# Patient Record
Sex: Male | Born: 1950 | Race: Black or African American | Hispanic: No | Marital: Married | Smoking: Current every day smoker
Health system: Southern US, Community
[De-identification: ages and names within clinical notes are randomized; demographics above are authoritative.]

## PROBLEM LIST (undated history)

## (undated) DIAGNOSIS — I5042 Chronic combined systolic (congestive) and diastolic (congestive) heart failure: Secondary | ICD-10-CM

## (undated) DIAGNOSIS — I428 Other cardiomyopathies: Secondary | ICD-10-CM

## (undated) DIAGNOSIS — J449 Chronic obstructive pulmonary disease, unspecified: Secondary | ICD-10-CM

## (undated) DIAGNOSIS — E119 Type 2 diabetes mellitus without complications: Secondary | ICD-10-CM

## (undated) DIAGNOSIS — I509 Heart failure, unspecified: Secondary | ICD-10-CM

## (undated) DIAGNOSIS — F1910 Other psychoactive substance abuse, uncomplicated: Secondary | ICD-10-CM

## (undated) DIAGNOSIS — N183 Chronic kidney disease, stage 3 unspecified: Secondary | ICD-10-CM

## (undated) DIAGNOSIS — I480 Paroxysmal atrial fibrillation: Secondary | ICD-10-CM

## (undated) DIAGNOSIS — Z720 Tobacco use: Secondary | ICD-10-CM

## (undated) DIAGNOSIS — I119 Hypertensive heart disease without heart failure: Secondary | ICD-10-CM

## (undated) DIAGNOSIS — I2510 Atherosclerotic heart disease of native coronary artery without angina pectoris: Secondary | ICD-10-CM

## (undated) HISTORY — DX: Hypertensive heart disease without heart failure: I11.9

## (undated) HISTORY — DX: Other cardiomyopathies: I42.8

## (undated) HISTORY — DX: Tobacco use: Z72.0

## (undated) HISTORY — DX: Chronic obstructive pulmonary disease, unspecified: J44.9

## (undated) HISTORY — DX: Paroxysmal atrial fibrillation: I48.0

---

## 2015-02-09 ENCOUNTER — Emergency Department: Payer: Self-pay

## 2015-02-09 ENCOUNTER — Other Ambulatory Visit: Payer: Self-pay

## 2015-02-09 ENCOUNTER — Emergency Department
Admission: EM | Admit: 2015-02-09 | Discharge: 2015-02-09 | Disposition: A | Discharge status: Home / Self Care | Payer: Self-pay | Attending: Emergency Medicine | Admitting: Emergency Medicine

## 2015-02-09 ENCOUNTER — Encounter: Payer: Self-pay | Admitting: Emergency Medicine

## 2015-02-09 DIAGNOSIS — Z720 Tobacco use: Secondary | ICD-10-CM | POA: Insufficient documentation

## 2015-02-09 DIAGNOSIS — I10 Essential (primary) hypertension: Secondary | ICD-10-CM | POA: Insufficient documentation

## 2015-02-09 DIAGNOSIS — J441 Chronic obstructive pulmonary disease with (acute) exacerbation: Secondary | ICD-10-CM | POA: Insufficient documentation

## 2015-02-09 DIAGNOSIS — J40 Bronchitis, not specified as acute or chronic: Secondary | ICD-10-CM

## 2015-02-09 DIAGNOSIS — E119 Type 2 diabetes mellitus without complications: Secondary | ICD-10-CM | POA: Insufficient documentation

## 2015-02-09 HISTORY — DX: Type 2 diabetes mellitus without complications: E11.9

## 2015-02-09 LAB — CBC
HCT: 41.6 % (ref 40.0–52.0)
Hemoglobin: 13.5 g/dL (ref 13.0–18.0)
MCH: 31.2 pg (ref 26.0–34.0)
MCHC: 32.5 g/dL (ref 32.0–36.0)
MCV: 95.9 fL (ref 80.0–100.0)
Platelets: 192 10*3/uL (ref 150–440)
RBC: 4.34 MIL/uL — ABNORMAL LOW (ref 4.40–5.90)
RDW: 14.7 % — ABNORMAL HIGH (ref 11.5–14.5)
WBC: 3.8 10*3/uL (ref 3.8–10.6)

## 2015-02-09 LAB — BASIC METABOLIC PANEL
Anion gap: 6 (ref 5–15)
BUN: 13 mg/dL (ref 6–20)
CO2: 28 mmol/L (ref 22–32)
Calcium: 9.1 mg/dL (ref 8.9–10.3)
Chloride: 103 mmol/L (ref 101–111)
Creatinine, Ser: 1.05 mg/dL (ref 0.61–1.24)
GFR calc Af Amer: >60 mL/min (ref >60)
GFR calc non Af Amer: >60 mL/min (ref >60)
Glucose, Bld: 275 mg/dL — ABNORMAL HIGH (ref 65–99)
Potassium: 4 mmol/L (ref 3.5–5.1)
Sodium: 137 mmol/L (ref 135–145)

## 2015-02-09 LAB — TROPONIN I: Troponin I: 0.05 ng/mL — ABNORMAL HIGH (ref <0.031)

## 2015-02-09 MED ORDER — PREDNISONE 50 MG PO TABS: 50.0000 mg | ORAL_TABLET | Freq: Every day | ORAL | Status: DC

## 2015-02-09 MED ORDER — IPRATROPIUM-ALBUTEROL 0.5-2.5 (3) MG/3ML IN SOLN
3.0000 mL | Freq: Once | RESPIRATORY_TRACT | Status: AC
  Administered 2015-02-09: 3 mL via RESPIRATORY_TRACT

## 2015-02-09 MED ORDER — IPRATROPIUM-ALBUTEROL 0.5-2.5 (3) MG/3ML IN SOLN
RESPIRATORY_TRACT | Status: AC
  Administered 2015-02-09: 3 mL via RESPIRATORY_TRACT
  Filled 2015-02-09: qty 3

## 2015-02-09 MED ORDER — AZITHROMYCIN 250 MG PO TABS: ORAL_TABLET | ORAL | Status: AC

## 2015-02-09 NOTE — Discharge Instructions (Signed)
Upper Respiratory Infection, Adult An upper respiratory infection (URI) is also sometimes known as the common cold. The upper respiratory tract includes the nose, sinuses, throat, trachea, and bronchi. Bronchi are the airways leading to the lungs. Most people improve within 1 week, but symptoms can last up to 2 weeks. A residual cough may last even longer.  CAUSES Many different viruses can infect the tissues lining the upper respiratory tract. The tissues become irritated and inflamed and often become very moist. Mucus production is also common. A cold is contagious. You can easily spread the virus to others by oral contact. This includes kissing, sharing a glass, coughing, or sneezing. Touching your mouth or nose and then touching a surface, which is then touched by another person, can also spread the virus. SYMPTOMS  Symptoms typically develop 1 to 3 days after you come in contact with a cold virus. Symptoms vary from person to person. They may include:  Runny nose.  Sneezing.  Nasal congestion.  Sinus irritation.  Sore throat.  Loss of voice (laryngitis).  Cough.  Fatigue.  Muscle aches.  Loss of appetite.  Headache.  Low-grade fever. DIAGNOSIS  You might diagnose your own cold based on familiar symptoms, since most people get a cold 2 to 3 times a year. Your caregiver can confirm this based on your exam. Most importantly, your caregiver can check that your symptoms are not due to another disease such as strep throat, sinusitis, pneumonia, asthma, or epiglottitis. Blood tests, throat tests, and X-rays are not necessary to diagnose a common cold, but they may sometimes be helpful in excluding other more serious diseases. Your caregiver will decide if any further tests are required. RISKS AND COMPLICATIONS  You may be at risk for a more severe case of the common cold if you smoke cigarettes, have chronic heart disease (such as heart failure) or lung disease (such as asthma), or if  you have a weakened immune system. The very young and very old are also at risk for more serious infections. Bacterial sinusitis, middle ear infections, and bacterial pneumonia can complicate the common cold. The common cold can worsen asthma and chronic obstructive pulmonary disease (COPD). Sometimes, these complications can require emergency medical care and may be life-threatening. PREVENTION  The best way to protect against getting a cold is to practice good hygiene. Avoid oral or hand contact with people with cold symptoms. Wash your hands often if contact occurs. There is no clear evidence that vitamin C, vitamin E, echinacea, or exercise reduces the chance of developing a cold. However, it is always recommended to get plenty of rest and practice good nutrition. TREATMENT  Treatment is directed at relieving symptoms. There is no cure. Antibiotics are not effective, because the infection is caused by a virus, not by bacteria. Treatment may include:  Increased fluid intake. Sports drinks offer valuable electrolytes, sugars, and fluids.  Breathing heated mist or steam (vaporizer or shower).  Eating chicken soup or other clear broths, and maintaining good nutrition.  Getting plenty of rest.  Using gargles or lozenges for comfort.  Controlling fevers with ibuprofen or acetaminophen as directed by your caregiver.  Increasing usage of your inhaler if you have asthma. Zinc gel and zinc lozenges, taken in the first 24 hours of the common cold, can shorten the duration and lessen the severity of symptoms. Pain medicines may help with fever, muscle aches, and throat pain. A variety of non-prescription medicines are available to treat congestion and runny nose. Your caregiver   can make recommendations and may suggest nasal or lung inhalers for other symptoms.  HOME CARE INSTRUCTIONS   Only take over-the-counter or prescription medicines for pain, discomfort, or fever as directed by your  caregiver.  Use a warm mist humidifier or inhale steam from a shower to increase air moisture. This may keep secretions moist and make it easier to breathe.  Drink enough water and fluids to keep your urine clear or pale yellow.  Rest as needed.  Return to work when your temperature has returned to normal or as your caregiver advises. You may need to stay home longer to avoid infecting others. You can also use a face mask and careful hand washing to prevent spread of the virus. SEEK MEDICAL CARE IF:   After the first few days, you feel you are getting worse rather than better.  You need your caregiver's advice about medicines to control symptoms.  You develop chills, worsening shortness of breath, or brown or red sputum. These may be signs of pneumonia.  You develop yellow or brown nasal discharge or pain in the face, especially when you bend forward. These may be signs of sinusitis.  You develop a fever, swollen neck glands, pain with swallowing, or white areas in the back of your throat. These may be signs of strep throat. SEEK IMMEDIATE MEDICAL CARE IF:   You have a fever.  You develop severe or persistent headache, ear pain, sinus pain, or chest pain.  You develop wheezing, a prolonged cough, cough up blood, or have a change in your usual mucus (if you have chronic lung disease).  You develop sore muscles or a stiff neck. Document Released: 02/01/2001 Document Revised: 10/31/2011 Document Reviewed: 11/13/2013 Exit[employer] Patient Information 2015 [employer]. This information is not intended to replace advice given to you by your health care provider. Make sure you discuss any questions you have with your health care provider.  

## 2015-02-09 NOTE — ED Notes (Signed)
Patient to ED with c/o shortness of breath and mostly non productive cough since Saturday. Reports that he still smokes.

## 2015-02-09 NOTE — ED Notes (Signed)
Reports dry cough has been occuring for the past month

## 2015-02-09 NOTE — ED Provider Notes (Signed)
[facility] Emergency Department Provider Note  ____________________________________________  Time seen: On arrival  I have reviewed the triage vital signs and the nursing notes.   HISTORY  Chief Complaint Shortness of Breath and Cough    HPI Jorge Cruz is a 64 y.o. male who presents with cough and mild shortness of breath. He reports the cough is been going on for approximately one month this gotten worse over the last 3-4 days. He continues to smoke. He denies a history of COPD. He has no fevers or chills. He has no chest pain. Occasionally when he coughs he has discomfort on the left chest but not currently. He reports the cough is moderate in nature.     Past Medical History  Diagnosis Date  . Diabetes mellitus without complication   . Hypertension     There are no active problems to display for this patient.   History reviewed. No pertinent past surgical history.  No current outpatient prescriptions on file.  Allergies Review of patient's allergies indicates no known allergies.  History reviewed. No pertinent family history.  Social History History  Substance Use Topics  . Smoking status: Current Every Day Smoker  . Smokeless tobacco: Never Used  . Alcohol Use: No    Review of Systems  Constitutional: Negative for fever. Eyes: Negative for visual changes. ENT: Negative for sore throat Cardiovascular: Negative for chest pain. Respiratory: Mild short of breath, positive for cough Gastrointestinal: Negative for abdominal pain, vomiting and diarrhea. Genitourinary: Negative for dysuria. Musculoskeletal: Negative for back pain. Skin: Negative for rash. Neurological: Negative for headaches or focal weakness Psychiatric: No anxiety or depression  10-point ROS otherwise negative.  ____________________________________________   PHYSICAL EXAM:  VITAL SIGNS: ED Triage Vitals  Enc Vitals Group     BP 02/09/15 0753 169/81 mmHg      Pulse Rate 02/09/15 0753 84     Resp 02/09/15 0753 22     Temp 02/09/15 0753 98.2 F (36.8 C)     Temp Source 02/09/15 0753 Oral     SpO2 02/09/15 0753 99 %     Weight 02/09/15 0753 222 lb (100.699 kg)     Height 02/09/15 0753  (1.727 m)     Head Cir --      Peak Flow --      Pain Score --      Pain Loc --      Pain Edu? --      Excl. in GC? --      Constitutional: Alert and oriented. Well appearing and in no distress. Pleasant Eyes: Conjunctivae are normal. PERRL. ENT   Head: Normocephalic and atraumatic.    Mouth/Throat: Mucous membranes are moist. Cardiovascular: Normal rate, regular rhythm. Normal and symmetric distal pulses are present in all extremities. No murmurs, rubs, or gallops. Respiratory: Normal respiratory effort without tachypnea nor retractions. Breath sounds are clear and equal bilaterally.  Gastrointestinal: Soft and non-tender in all quadrants. No distention. There is no CVA tenderness. Genitourinary: deferred Musculoskeletal: Nontender with normal range of motion in all extremities. No lower extremity tenderness nor edema. Neurologic:  Normal speech and language. No gross focal neurologic deficits are appreciated. Skin:  Skin is warm, dry and intact. No rash noted. Psychiatric: Mood and affect are normal. Patient exhibits appropriate insight and judgment.  ____________________________________________    LABS (pertinent positives/negatives)  Labs Reviewed  BASIC METABOLIC PANEL  CBC  TROPONIN I    ____________________________________________   EKG  ED ECG REPORT  Dennard Nip, the attending physician, personally viewed and interpreted this ECG.   Date: 02/09/2015  EKG Time: 8:06 AM  Rate: 70  Rhythm: normal sinus rhythm, occasional PVC noted, unifocal  Axis: Normal  Intervals:none  ST&T Change: Nonspecific   ____________________________________________    RADIOLOGY  Chest x-ray reviewed by me, no acute  distress  ____________________________________________   PROCEDURES  Procedure(s) performed: none  Critical Care performed: none  ____________________________________________   INITIAL IMPRESSION / ASSESSMENT AND PLAN / ED COURSE  Pertinent labs & imaging results that were available during my care of the patient were reviewed by me and considered in my medical decision making (see chart for details).  Patient overall well-appearing. We will treat with DuoNeb. We will check blood tests and chest x-ray.  ----------------------------------------- 9:48 AM on 02/09/2015 -----------------------------------------  Patient with very minimally elevated troponin, but he has no chest pain. His EKG is not concerning. Given he is here primarily for cough we will treat this with steroid's and a course of antibiotics with PCP follow-up.   ____________________________________________   FINAL CLINICAL IMPRESSION(S) / ED DIAGNOSES  Final diagnoses:  Bronchitis     Jene Every, MD 02/09/15 [phone]

## 2015-02-09 NOTE — ED Notes (Signed)
Dr. Kinner notified of elevated troponin. 

## 2016-03-31 ENCOUNTER — Encounter: Payer: Self-pay | Admitting: Emergency Medicine

## 2016-03-31 ENCOUNTER — Emergency Department: Payer: Self-pay

## 2016-03-31 ENCOUNTER — Observation Stay
Admission: EM | Admit: 2016-03-31 | Discharge: 2016-04-01 | Disposition: A | Discharge status: Home / Self Care | Payer: Self-pay | Attending: Internal Medicine | Admitting: Internal Medicine

## 2016-03-31 DIAGNOSIS — J441 Chronic obstructive pulmonary disease with (acute) exacerbation: Secondary | ICD-10-CM | POA: Insufficient documentation

## 2016-03-31 DIAGNOSIS — I340 Nonrheumatic mitral (valve) insufficiency: Secondary | ICD-10-CM | POA: Insufficient documentation

## 2016-03-31 DIAGNOSIS — E669 Obesity, unspecified: Secondary | ICD-10-CM | POA: Insufficient documentation

## 2016-03-31 DIAGNOSIS — Z6833 Body mass index (BMI) 33.0-33.9, adult: Secondary | ICD-10-CM | POA: Insufficient documentation

## 2016-03-31 DIAGNOSIS — Z794 Long term (current) use of insulin: Secondary | ICD-10-CM | POA: Insufficient documentation

## 2016-03-31 DIAGNOSIS — E785 Hyperlipidemia, unspecified: Secondary | ICD-10-CM | POA: Insufficient documentation

## 2016-03-31 DIAGNOSIS — R062 Wheezing: Secondary | ICD-10-CM

## 2016-03-31 DIAGNOSIS — I493 Ventricular premature depolarization: Secondary | ICD-10-CM | POA: Insufficient documentation

## 2016-03-31 DIAGNOSIS — J40 Bronchitis, not specified as acute or chronic: Secondary | ICD-10-CM

## 2016-03-31 DIAGNOSIS — Z79899 Other long term (current) drug therapy: Secondary | ICD-10-CM | POA: Insufficient documentation

## 2016-03-31 DIAGNOSIS — I429 Cardiomyopathy, unspecified: Secondary | ICD-10-CM | POA: Insufficient documentation

## 2016-03-31 DIAGNOSIS — F17200 Nicotine dependence, unspecified, uncomplicated: Secondary | ICD-10-CM | POA: Insufficient documentation

## 2016-03-31 DIAGNOSIS — E119 Type 2 diabetes mellitus without complications: Secondary | ICD-10-CM | POA: Insufficient documentation

## 2016-03-31 DIAGNOSIS — I10 Essential (primary) hypertension: Secondary | ICD-10-CM | POA: Insufficient documentation

## 2016-03-31 DIAGNOSIS — R778 Other specified abnormalities of plasma proteins: Secondary | ICD-10-CM | POA: Diagnosis present

## 2016-03-31 DIAGNOSIS — J449 Chronic obstructive pulmonary disease, unspecified: Secondary | ICD-10-CM | POA: Diagnosis present

## 2016-03-31 DIAGNOSIS — R0789 Other chest pain: Secondary | ICD-10-CM | POA: Diagnosis present

## 2016-03-31 DIAGNOSIS — R7989 Other specified abnormal findings of blood chemistry: Secondary | ICD-10-CM | POA: Diagnosis present

## 2016-03-31 DIAGNOSIS — R079 Chest pain, unspecified: Principal | ICD-10-CM | POA: Insufficient documentation

## 2016-03-31 LAB — BASIC METABOLIC PANEL
Anion gap: 8 (ref 5–15)
BUN: 29 mg/dL — ABNORMAL HIGH (ref 6–20)
CO2: 28 mmol/L (ref 22–32)
Calcium: 9.7 mg/dL (ref 8.9–10.3)
Chloride: 101 mmol/L (ref 101–111)
Creatinine, Ser: 1.23 mg/dL (ref 0.61–1.24)
GFR calc Af Amer: >60 mL/min (ref >60)
GFR calc non Af Amer: >60 mL/min (ref >60)
Glucose, Bld: 144 mg/dL — ABNORMAL HIGH (ref 65–99)
Potassium: 4.4 mmol/L (ref 3.5–5.1)
Sodium: 137 mmol/L (ref 135–145)

## 2016-03-31 LAB — CBC
HCT: 39.1 % — ABNORMAL LOW (ref 40.0–52.0)
Hemoglobin: 13.5 g/dL (ref 13.0–18.0)
MCH: 31.7 pg (ref 26.0–34.0)
MCHC: 34.6 g/dL (ref 32.0–36.0)
MCV: 91.7 fL (ref 80.0–100.0)
Platelets: 236 10*3/uL (ref 150–440)
RBC: 4.26 MIL/uL — ABNORMAL LOW (ref 4.40–5.90)
RDW: 14.9 % — ABNORMAL HIGH (ref 11.5–14.5)
WBC: 5.6 10*3/uL (ref 3.8–10.6)

## 2016-03-31 LAB — TROPONIN I
Troponin I: 0.08 ng/mL (ref <0.03)
Troponin I: 0.09 ng/mL (ref <0.03)

## 2016-03-31 MED ORDER — PREDNISONE 20 MG PO TABS
60.0000 mg | ORAL_TABLET | Freq: Once | ORAL | Status: AC
  Administered 2016-03-31: 60 mg via ORAL
  Filled 2016-03-31: qty 3

## 2016-03-31 MED ORDER — ACETAMINOPHEN 325 MG PO TABS: 650.0000 mg | ORAL_TABLET | Freq: Four times a day (QID) | ORAL | Status: DC | PRN

## 2016-03-31 MED ORDER — ENOXAPARIN SODIUM 40 MG/0.4ML ~~LOC~~ SOLN: 40.0000 mg | SUBCUTANEOUS | Status: DC

## 2016-03-31 MED ORDER — SODIUM CHLORIDE 0.9 % IV SOLN
INTRAVENOUS | Status: DC
  Administered 2016-04-01: via INTRAVENOUS

## 2016-03-31 MED ORDER — INSULIN ASPART 100 UNIT/ML ~~LOC~~ SOLN
0.0000 [IU] | Freq: Four times a day (QID) | SUBCUTANEOUS | Status: DC
  Administered 2016-04-01: 2 [IU] via SUBCUTANEOUS
  Administered 2016-04-01: 5 [IU] via SUBCUTANEOUS
  Filled 2016-03-31: qty 2
  Filled 2016-03-31: qty 5

## 2016-03-31 MED ORDER — ONDANSETRON HCL 4 MG PO TABS: 4.0000 mg | ORAL_TABLET | Freq: Four times a day (QID) | ORAL | Status: DC | PRN

## 2016-03-31 MED ORDER — ACETAMINOPHEN 650 MG RE SUPP: 650.0000 mg | Freq: Four times a day (QID) | RECTAL | Status: DC | PRN

## 2016-03-31 MED ORDER — ONDANSETRON HCL 4 MG/2ML IJ SOLN: 4.0000 mg | Freq: Four times a day (QID) | INTRAMUSCULAR | Status: DC | PRN

## 2016-03-31 MED ORDER — IPRATROPIUM-ALBUTEROL 0.5-2.5 (3) MG/3ML IN SOLN
3.0000 mL | Freq: Once | RESPIRATORY_TRACT | Status: AC
  Administered 2016-03-31: 3 mL via RESPIRATORY_TRACT
  Filled 2016-03-31: qty 3

## 2016-03-31 MED ORDER — SODIUM CHLORIDE 0.9% FLUSH
3.0000 mL | Freq: Two times a day (BID) | INTRAVENOUS | Status: DC
  Administered 2016-04-01 (×2): 3 mL via INTRAVENOUS

## 2016-03-31 MED ORDER — ALBUTEROL SULFATE HFA 108 (90 BASE) MCG/ACT IN AERS: 2.0000 | INHALATION_SPRAY | Freq: Four times a day (QID) | RESPIRATORY_TRACT | 0 refills | Status: DC | PRN

## 2016-03-31 MED ORDER — ASPIRIN 81 MG PO CHEW
324.0000 mg | CHEWABLE_TABLET | Freq: Once | ORAL | Status: AC
  Administered 2016-03-31: 324 mg via ORAL
  Filled 2016-03-31: qty 4

## 2016-03-31 MED ORDER — OXYCODONE HCL 5 MG PO TABS: 5.0000 mg | ORAL_TABLET | ORAL | Status: DC | PRN

## 2016-03-31 MED ORDER — PREDNISONE 10 MG PO TABS: ORAL_TABLET | ORAL | 0 refills | Status: DC

## 2016-03-31 MED ORDER — LISINOPRIL 20 MG PO TABS
20.0000 mg | ORAL_TABLET | Freq: Every day | ORAL | Status: DC
  Administered 2016-04-01: 20 mg via ORAL
  Filled 2016-03-31: qty 1

## 2016-03-31 NOTE — ED Provider Notes (Signed)
[facility] Emergency Department Provider Note ____________________________________________  Time seen: Approximately 6:20 PM I have reviewed the triage vital signs and the triage nursing note.  HISTORY  Chief Complaint Chest Pain   Historian Patient and wife  HPI Jorge Cruz is a 65 y.o. male with a history of diabetes and hypertension, without known coronary artery disease, or diagnosis of COPD, however prior diagnosis of bronchitis, is presenting to the emergency department with several days of worsening trouble breathing and coughing. Dry cough, nonproductive. No fevers or chills. He has some chest tightness and chest discomfort when he is coughing. Symptoms are moderate to severe.  He states that he has done a stress test at [facility] in the past 3 years. He does not follow with a cardiologist, nor have a history of any prior cardiac issues.  No nausea, vomiting, or diarrhea or abdominal pain.    Past Medical History:  Diagnosis Date  . Diabetes mellitus without complication (HCC)   . Hypertension     There are no active problems to display for this patient.   History reviewed. No pertinent surgical history.  Prior to Admission medications   Medication Sig Start Date End Date Taking? Authorizing Provider  albuterol (PROVENTIL HFA;VENTOLIN HFA) 108 (90 Base) MCG/ACT inhaler Inhale 2 puffs into the lungs every 6 (six) hours as needed for wheezing or shortness of breath. 03/31/16   Governor Rooks, MD  glipiZIDE (GLUCOTROL) 10 MG tablet Take 1 tablet by mouth daily.    Historical Provider, MD  lisinopril (PRINIVIL,ZESTRIL) 20 MG tablet Take 20 mg by mouth daily.    Historical Provider, MD  metFORMIN (GLUCOPHAGE) 1000 MG tablet Take 1 tablet by mouth 2 (two) times daily.    Historical Provider, MD  predniSONE (DELTASONE) 10 MG tablet 50 mg daily for 4 more days. 03/31/16   Governor Rooks, MD    No Known Allergies  No family history on file.  Social  History Social History  Substance Use Topics  . Smoking status: Current Every Day Smoker  . Smokeless tobacco: Never Used  . Alcohol use No    Review of Systems  Constitutional: Negative for fever. Eyes: Negative for visual changes. ENT: Negative for sore throat. Cardiovascular: Negative for palpitations. Chest tightness. Respiratory: Positive for shortness of breath. Gastrointestinal: Negative for abdominal pain, vomiting and diarrhea. Genitourinary: Negative for dysuria. Musculoskeletal: Negative for back pain. Skin: Negative for rash. Neurological: Negative for headache. 10 point Review of Systems otherwise negative ____________________________________________   PHYSICAL EXAM:  VITAL SIGNS: ED Triage Vitals  Enc Vitals Group     BP 03/31/16 1624 (!) 150/61     Pulse Rate 03/31/16 1624 87     Resp 03/31/16 1624 18     Temp 03/31/16 1624 98.6 F (37 C)     Temp Source 03/31/16 1624 Oral     SpO2 03/31/16 1624 99 %     Weight 03/31/16 1625 222 lb (100.7 kg)     Height 03/31/16 1625 5\' 8"  (1.727 m)     Head Circumference --      Peak Flow --      Pain Score 03/31/16 1721 6     Pain Loc --      Pain Edu? --      Excl. in GC? --      Constitutional: Alert and oriented. Well appearing and in no distress. HEENT   Head: Normocephalic and atraumatic.      Eyes: Conjunctivae are normal. PERRL. Normal extraocular  movements.      Ears:         Nose: No congestion/rhinnorhea.   Mouth/Throat: Mucous membranes are moist.   Neck: No stridor. Cardiovascular/Chest: Normal rate, regular rhythm.  No murmurs, rubs, or gallops. Respiratory: Normal respiratory effort without tachypnea nor retractions.  Moderate and expiratory wheezing with a deep breath. Gastrointestinal: Soft. No distention, no guarding, no rebound. Nontender.    Genitourinary/rectal:Deferred Musculoskeletal: Nontender with normal range of motion in all extremities. No joint effusions.  No lower  extremity tenderness.  No edema. Neurologic:  Normal speech and language. No gross or focal neurologic deficits are appreciated. Skin:  Skin is warm, dry and intact. No rash noted. Psychiatric: Mood and affect are normal. Speech and behavior are normal. Patient exhibits appropriate insight and judgment.  ____________________________________________   EKG I, Governor Rooks, MD, the attending physician have personally viewed and interpreted all ECGs.  88 bpm. Normal sinus rhythm. Narrow QRS. Normal axis. Normal ST and T-wave. ____________________________________________  LABS (pertinent positives/negatives)  Labs Reviewed  BASIC METABOLIC PANEL - Abnormal; Notable for the following:       Result Value   Glucose, Bld 144 (*)    BUN 29 (*)    All other components within normal limits  CBC - Abnormal; Notable for the following:    RBC 4.26 (*)    HCT 39.1 (*)    RDW 14.9 (*)    All other components within normal limits  TROPONIN I - Abnormal; Notable for the following:    Troponin I 0.08 (*)    All other components within normal limits  TROPONIN I - Abnormal; Notable for the following:    Troponin I 0.09 (*)    All other components within normal limits    ____________________________________________  RADIOLOGY All Xrays were viewed by me. Imaging interpreted by Radiologist.  Chest x-ray two-view:IMPRESSION: 1. Mild cardiomegaly. 2. No evidence for acute pulmonary abnormality.   __________________________________________  PROCEDURES  Procedure(s) performed: None  Critical Care performed: None  ____________________________________________   ED COURSE / ASSESSMENT AND PLAN  Pertinent labs & imaging results that were available during my care of the patient were reviewed by me and considered in my medical decision making (see chart for details).   Jorge Cruz is here for trouble breathing and has symptoms of bronchospasm consistent with COPD exacerbation versus  bronchitis. As he does not carry a diagnosis of COPD, but he is a smoker.  Ultrasound with DuoNeb here as well as prednisone. He is not hypoxic, is 100% on room air. Next the next I interpreted the chest discomfort, it sounds like a chest tightness and it's worse when he is coughing. His EKG is reassuring showing and without significant abnormality, but his troponin did came back minimally elevated at 0.08.  I will check a 3 hour repeat troponin. He did have a minimally elevated troponin in the summer when he was diagnosed with bronchitis at 0.05.   Repeat troponin is up to 0.09. Patient is currently essentially asymptomatic after breathing treatment. I spoke with on-call cardiologist who recommended observation in the hospital. Patient had received aspirin.    CONSULTATIONS:  Dr. Kirke Corin, cardiology, recommended overnight observation. I spoke with the hospitalist for admission.   Patient / Family / Caregiver informed of clinical course, medical decision-making process, and agree with plan.    ___________________________________________   FINAL CLINICAL IMPRESSION(S) / ED DIAGNOSES   Final diagnoses:  Bronchitis  Wheezing  Troponin I above reference range  Note: This dictation was prepared with Dragon dictation. Any transcriptional errors that result from this process are unintentional    Governor Rooks, MD 03/31/16 2011

## 2016-03-31 NOTE — ED Notes (Signed)
MD at bedside. 

## 2016-03-31 NOTE — ED Notes (Signed)
Transporting to room 260-2A

## 2016-03-31 NOTE — H&P (Signed)
[facility] - [place] at [facility]   PATIENT NAME: Jorge Cruz    MR#:  [id number]  DATE OF BIRTH:  07/24/1951  DATE OF ADMISSION:  03/31/2016  PRIMARY CARE PHYSICIAN: No PCP Per Patient   REQUESTING/REFERRING PHYSICIAN: Shaune Pollack, MD  CHIEF COMPLAINT:   Chief Complaint  Patient presents with  . Chest Pain    HISTORY OF PRESENT ILLNESS:  Jorge Cruz  is a 65 y.o. male who presents with Chest pain. Patient states that he began to develop a cough with some wheezing 3-4 days ago. Shortly after that he developed some central chest pain. He states the pain is sharp, however it is not reproducible to palpation. This pain does not radiate. It is not associated with any diaphoresis, nausea or vomiting, abdominal pain. Here in the ED he was found to have a mildly elevated troponin at 0.08 with a subsequent at 0.09.  PAST MEDICAL HISTORY:   Past Medical History:  Diagnosis Date  . Diabetes mellitus without complication (HCC)   . Hypertension     PAST SURGICAL HISTORY:  History reviewed. No pertinent surgical history.  SOCIAL HISTORY:   Social History  Substance Use Topics  . Smoking status: Current Every Day Smoker  . Smokeless tobacco: Never Used  . Alcohol use No    FAMILY HISTORY:  No family history on file.  DRUG ALLERGIES:  No Known Allergies  MEDICATIONS AT HOME:   Prior to Admission medications   Medication Sig Start Date End Date Taking? Authorizing Provider  glipiZIDE (GLUCOTROL XL) 10 MG 24 hr tablet Take 10 mg by mouth daily.   Yes Historical Provider, MD  insulin detemir (LEVEMIR) 100 UNIT/ML injection Inject 34 Units into the skin at bedtime.   Yes Historical Provider, MD  lisinopril (PRINIVIL,ZESTRIL) 20 MG tablet Take 20 mg by mouth daily.   Yes Historical Provider, MD  metFORMIN (GLUCOPHAGE) 1000 MG tablet Take 1 tablet by mouth 2 (two) times daily.   Yes Historical Provider, MD  albuterol (PROVENTIL HFA;VENTOLIN HFA) 108 (90 Base)  MCG/ACT inhaler Inhale 2 puffs into the lungs every 6 (six) hours as needed for wheezing or shortness of breath. 03/31/16   Governor Rooks, MD  predniSONE (DELTASONE) 10 MG tablet 50 mg daily for 4 more days. 03/31/16   Governor Rooks, MD    REVIEW OF SYSTEMS:  Review of Systems  Constitutional: Negative for chills, fever, malaise/fatigue and weight loss.  HENT: Negative for ear pain, hearing loss and tinnitus.   Eyes: Negative for blurred vision, double vision, pain and redness.  Respiratory: Positive for cough and wheezing. Negative for hemoptysis and shortness of breath.   Cardiovascular: Positive for chest pain. Negative for palpitations, orthopnea and leg swelling.  Gastrointestinal: Negative for abdominal pain, constipation, diarrhea, nausea and vomiting.  Genitourinary: Negative for dysuria, frequency and hematuria.  Musculoskeletal: Negative for back pain, joint pain and neck pain.  Skin:       No acne, rash, or lesions  Neurological: Negative for dizziness, tremors, focal weakness and weakness.  Endo/Heme/Allergies: Negative for polydipsia. Does not bruise/bleed easily.  Psychiatric/Behavioral: Negative for depression. The patient is not nervous/anxious and does not have insomnia.      VITAL SIGNS:   Vitals:   03/31/16 1800 03/31/16 1815 03/31/16 1830 03/31/16 1930  BP: 131/80  (!) 150/84 (!) 141/65  Pulse:  82 83 93  Resp: 16 (!) 21 20 (!) 25  Temp:      TempSrc:  SpO2:  97% 99% 100%  Weight:      Height:       Wt Readings from Last 3 Encounters:  03/31/16 100.7 kg (222 lb)  02/09/15 100.7 kg (222 lb)    PHYSICAL EXAMINATION:  Physical Exam  Vitals reviewed. Constitutional: He is oriented to person, place, and time. He appears well-developed and well-nourished. No distress.  HENT:  Head: Normocephalic and atraumatic.  Mouth/Throat: Oropharynx is clear and moist.  Eyes: Conjunctivae and EOM are normal. Pupils are equal, round, and reactive to light. No scleral  icterus.  Neck: Normal range of motion. Neck supple. No JVD present. No thyromegaly present.  Cardiovascular: Normal rate, regular rhythm and intact distal pulses.  Exam reveals no gallop and no friction rub.   No murmur heard. Respiratory: Effort normal. No respiratory distress. He has wheezes. He has no rales.  GI: Soft. Bowel sounds are normal. He exhibits no distension. There is no tenderness.  Musculoskeletal: Normal range of motion. He exhibits no edema.  No arthritis, no gout  Lymphadenopathy:    He has no cervical adenopathy.  Neurological: He is alert and oriented to person, place, and time. No cranial nerve deficit.  No dysarthria, no aphasia  Skin: Skin is warm and dry. No rash noted. No erythema.  Psychiatric: He has a normal mood and affect. His behavior is normal. Judgment and thought content normal.    LABORATORY PANEL:   CBC  Recent Labs Lab 03/31/16 1627  WBC 5.6  HGB 13.5  HCT 39.1*  PLT 236   ------------------------------------------------------------------------------------------------------------------  Chemistries   Recent Labs Lab 03/31/16 1627  NA 137  K 4.4  CL 101  CO2 28  GLUCOSE 144*  BUN 29*  CREATININE 1.23  CALCIUM 9.7   ------------------------------------------------------------------------------------------------------------------  Cardiac Enzymes  Recent Labs Lab 03/31/16 1902  TROPONINI 0.09*   ------------------------------------------------------------------------------------------------------------------  RADIOLOGY:  Dg Chest 2 View  Result Date: 03/31/2016 CLINICAL DATA:  Left-sided chest pain and cough for 2 days. EXAM: CHEST  2 VIEW COMPARISON:  02/09/2015 FINDINGS: The heart is mildly enlarged with prominent left ventricular contour. The lungs are free of focal consolidations and pleural effusions. No pulmonary edema. IMPRESSION: 1. Mild cardiomegaly. 2.  No evidence for acute pulmonary abnormality. Electronically  Signed   By: Norva Pavlov M.D.   On: 03/31/2016 17:01    EKG:   Orders placed or performed during the hospital encounter of 03/31/16  . EKG 12-Lead  . EKG 12-Lead  . ED EKG within 10 minutes  . ED EKG within 10 minutes    IMPRESSION AND PLAN:  Principal Problem:   Chest pain - pain description is not entirely consistent with typical cardiac pain, however he does have elevated troponin. We will admit him for workup for potential ACS. Trend his troponins tonight, get an echocardiogram in the morning and a cardiology consult. Will also treat him for potential COPD exacerbation. He has not been diagnosed with COPD but he does have 50 year smoking history. Active Problems:   Elevated troponin - trend his troponins serially tonight, echocardiogram and cardiology consult the morning.   COPD exacerbation - by mouth steroids and when necessary DuoNeb's   Diabetes (HCC) - signing scale insulin with corresponding glucose checks and a carb modified diet  All the records are reviewed and case discussed with ED provider. Management plans discussed with the patient and/or family.  DVT PROPHYLAXIS: SubQ lovenox  GI PROPHYLAXIS: None  ADMISSION STATUS: Observation  CODE STATUS: Full Code Status  History    This patient does not have a recorded code status. Please follow your organizational policy for patients in this situation.      TOTAL TIME TAKING CARE OF THIS PATIENT: 40 minutes.    Mariyanna Mucha FIELDING 03/31/2016, 8:41 PM  [facility]  Office  [phone]  CC: Primary care physician; No PCP Per Patient

## 2016-03-31 NOTE — ED Triage Notes (Signed)
Pt presents with chest pain and shortness of breath with a cough. Been going on for a few days.

## 2016-04-01 ENCOUNTER — Observation Stay (HOSPITAL_BASED_OUTPATIENT_CLINIC_OR_DEPARTMENT_OTHER)
Admit: 2016-04-01 | Discharge: 2016-04-01 | Disposition: A | Discharge status: Home / Self Care | Payer: Self-pay | Attending: Internal Medicine | Admitting: Internal Medicine

## 2016-04-01 ENCOUNTER — Observation Stay (HOSPITAL_BASED_OUTPATIENT_CLINIC_OR_DEPARTMENT_OTHER): Payer: Self-pay

## 2016-04-01 ENCOUNTER — Encounter: Payer: Self-pay | Admitting: Physician Assistant

## 2016-04-01 DIAGNOSIS — R079 Chest pain, unspecified: Secondary | ICD-10-CM

## 2016-04-01 DIAGNOSIS — R7989 Other specified abnormal findings of blood chemistry: Secondary | ICD-10-CM

## 2016-04-01 DIAGNOSIS — J449 Chronic obstructive pulmonary disease, unspecified: Secondary | ICD-10-CM | POA: Diagnosis present

## 2016-04-01 DIAGNOSIS — E785 Hyperlipidemia, unspecified: Secondary | ICD-10-CM

## 2016-04-01 DIAGNOSIS — R0789 Other chest pain: Secondary | ICD-10-CM

## 2016-04-01 DIAGNOSIS — J441 Chronic obstructive pulmonary disease with (acute) exacerbation: Secondary | ICD-10-CM | POA: Diagnosis present

## 2016-04-01 DIAGNOSIS — I10 Essential (primary) hypertension: Secondary | ICD-10-CM

## 2016-04-01 LAB — GLUCOSE, CAPILLARY
Glucose-Capillary: 167 mg/dL — ABNORMAL HIGH (ref 65–99)
Glucose-Capillary: 204 mg/dL — ABNORMAL HIGH (ref 65–99)
Glucose-Capillary: 274 mg/dL — ABNORMAL HIGH (ref 65–99)

## 2016-04-01 LAB — BASIC METABOLIC PANEL
Anion gap: 7 (ref 5–15)
BUN: 33 mg/dL — ABNORMAL HIGH (ref 6–20)
CO2: 25 mmol/L (ref 22–32)
Calcium: 9.3 mg/dL (ref 8.9–10.3)
Chloride: 103 mmol/L (ref 101–111)
Creatinine, Ser: 1.24 mg/dL (ref 0.61–1.24)
GFR calc Af Amer: >60 mL/min (ref >60)
GFR calc non Af Amer: 60 mL/min — ABNORMAL LOW (ref >60)
Glucose, Bld: 285 mg/dL — ABNORMAL HIGH (ref 65–99)
Potassium: 5.2 mmol/L — ABNORMAL HIGH (ref 3.5–5.1)
Sodium: 135 mmol/L (ref 135–145)

## 2016-04-01 LAB — NM MYOCAR MULTI W/SPECT W/WALL MOTION / EF
Estimated workload: 7.7 METS
Exercise duration (min): 7 min
Exercise duration (sec): 1 s
LV dias vol: 253 mL (ref 62–150)
LV sys vol: 202 mL
Peak HR: 144 {beats}/min
Percent HR: 92 %
Rest HR: 65 {beats}/min
SDS: 3
SRS: 3
SSS: 2
TID: 1.02

## 2016-04-01 LAB — LIPID PANEL
Cholesterol: 151 mg/dL (ref 0–200)
HDL: 46 mg/dL (ref >40)
LDL Cholesterol: 99 mg/dL (ref 0–99)
Total CHOL/HDL Ratio: 3.3 RATIO
Triglycerides: 31 mg/dL (ref <150)
VLDL: 6 mg/dL (ref 0–40)

## 2016-04-01 LAB — ECHOCARDIOGRAM COMPLETE
Height: 68 in
Weight: 3497.6 oz

## 2016-04-01 LAB — CBC
HCT: 38.7 % — ABNORMAL LOW (ref 40.0–52.0)
Hemoglobin: 13.3 g/dL (ref 13.0–18.0)
MCH: 32 pg (ref 26.0–34.0)
MCHC: 34.2 g/dL (ref 32.0–36.0)
MCV: 93.5 fL (ref 80.0–100.0)
Platelets: 225 10*3/uL (ref 150–440)
RBC: 4.14 MIL/uL — ABNORMAL LOW (ref 4.40–5.90)
RDW: 14.9 % — ABNORMAL HIGH (ref 11.5–14.5)
WBC: 5.1 10*3/uL (ref 3.8–10.6)

## 2016-04-01 LAB — TROPONIN I
Troponin I: 0.06 ng/mL (ref <0.03)
Troponin I: 0.04 ng/mL (ref <0.03)
Troponin I: 0.05 ng/mL (ref <0.03)

## 2016-04-01 LAB — HEMOGLOBIN A1C: Hgb A1c MFr Bld: 9.9 % — ABNORMAL HIGH (ref 4.0–6.0)

## 2016-04-01 MED ORDER — CARVEDILOL 6.25 MG PO TABS: 6.2500 mg | ORAL_TABLET | Freq: Two times a day (BID) | ORAL | 1 refills | Status: DC

## 2016-04-01 MED ORDER — PREDNISONE 10 MG PO TABS
ORAL_TABLET | ORAL | 0 refills | Status: DC
Start: 2016-04-01 — End: 2016-05-03

## 2016-04-01 MED ORDER — TECHNETIUM TC 99M MEDRONATE IV KIT
25.0000 | PACK | Freq: Once | INTRAVENOUS | Status: AC | PRN
  Administered 2016-04-01: 31.08 via INTRAVENOUS

## 2016-04-01 MED ORDER — GLIPIZIDE ER 10 MG PO TB24
10.0000 mg | ORAL_TABLET | Freq: Every day | ORAL | Status: DC
  Administered 2016-04-01: 10 mg via ORAL
  Filled 2016-04-01: qty 1

## 2016-04-01 MED ORDER — PREDNISONE 20 MG PO TABS
40.0000 mg | ORAL_TABLET | Freq: Every day | ORAL | Status: DC
  Administered 2016-04-01: 40 mg via ORAL
  Filled 2016-04-01: qty 2

## 2016-04-01 MED ORDER — INSULIN DETEMIR 100 UNIT/ML ~~LOC~~ SOLN
34.0000 [IU] | Freq: Every day | SUBCUTANEOUS | Status: DC
  Filled 2016-04-01: qty 0.34

## 2016-04-01 MED ORDER — IPRATROPIUM-ALBUTEROL 0.5-2.5 (3) MG/3ML IN SOLN: 3.0000 mL | RESPIRATORY_TRACT | Status: DC | PRN

## 2016-04-01 MED ORDER — AMLODIPINE BESYLATE 5 MG PO TABS
5.0000 mg | ORAL_TABLET | Freq: Every day | ORAL | Status: DC
  Administered 2016-04-01: 5 mg via ORAL
  Filled 2016-04-01: qty 1

## 2016-04-01 MED ORDER — BENZONATATE 100 MG PO CAPS
100.0000 mg | ORAL_CAPSULE | Freq: Three times a day (TID) | ORAL | Status: DC | PRN
  Administered 2016-04-01: 100 mg via ORAL
  Filled 2016-04-01: qty 1

## 2016-04-01 MED ORDER — ASPIRIN EC 81 MG PO TBEC: 81.0000 mg | DELAYED_RELEASE_TABLET | Freq: Every day | ORAL | 1 refills | Status: DC

## 2016-04-01 MED ORDER — TECHNETIUM TC 99M TETROFOSMIN IV KIT
12.8800 | PACK | Freq: Once | INTRAVENOUS | Status: AC | PRN
  Administered 2016-04-01: 12.88 via INTRAVENOUS

## 2016-04-01 MED ORDER — ALBUTEROL SULFATE HFA 108 (90 BASE) MCG/ACT IN AERS: 2.0000 | INHALATION_SPRAY | Freq: Four times a day (QID) | RESPIRATORY_TRACT | 1 refills | Status: DC | PRN

## 2016-04-01 MED ORDER — METFORMIN HCL 500 MG PO TABS
1000.0000 mg | ORAL_TABLET | Freq: Two times a day (BID) | ORAL | Status: DC
  Administered 2016-04-01: 1000 mg via ORAL
  Filled 2016-04-01: qty 2

## 2016-04-01 NOTE — Discharge Summary (Addendum)
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 Diabetes mellitus without complication (HCC)   . Hypertension     HOSPITAL COURSE:   Win Guajardo  is a 65 y.o. male who presents with Chest pain. Patient states that he began to develop a cough with some wheezing 3-4 days ago. Shortly after that he developed some central chest pain. He states the pain is sharp, however it is not reproducible to palpation  *Chest pain - pain description is not entirely consistent with typical cardiac pain, however he does have elevated troponin.  -CE trended down -no cp or sob any more -Myoview shows no WMA but EF 30% with severe CMP -add coreg, cont lisinopril -asa -pt will f/u with DR Ingal ([facility]ba[facility] cardiolgoy)  *  Elevated troponin - trend his troponins serially tonight, echocardiogram  -ASA -appreciate cardiology input  *  COPD exacerbation - by mouth steroids and when necessary DuoNeb's -advised smoking cessation -He has not been diagnosed with COPD but he does have 50 year smoking history.  *Diabetes (HCC) - signing scale insulin with corresponding glucose checks and a carb modified diet  Overall improving D/c later today once echo is performed  CONSULTS OBTAINED:  Treatment Team:  Iran Ouch, MD  DRUG ALLERGIES:  No Known Allergies  DISCHARGE MEDICATIONS:   Current Discharge Medication List    START taking these  medications   Details  albuterol (PROVENTIL HFA;VENTOLIN HFA) 108 (90 Base) MCG/ACT inhaler Inhale 2 puffs into the lungs every 6 (six) hours as needed for wheezing or shortness of breath. Qty: 1 Inhaler, Refills: 1    aspirin EC 81 MG tablet Take 1 tablet (81 mg total) by mouth daily. Qty: 30 tablet, Refills: 1    carvedilol (COREG) 6.25 MG tablet Take 1 tablet (6.25 mg total) by mouth 2 (two) times daily with a meal. Qty: 60 tablet, Refills: 1      CONTINUE these medications which have CHANGED   Details  predniSONE (DELTASONE) 10 MG tablet 50 mg daily for 4 more days. Qty: 15 tablet, Refills: 0      CONTINUE these medications which have NOT CHANGED   Details  glipiZIDE (GLUCOTROL XL) 10 MG 24 hr tablet Take 10 mg by mouth daily.    insulin detemir (LEVEMIR) 100 UNIT/ML injection Inject 34 Units into the skin at bedtime.    lisinopril (PRINIVIL,ZESTRIL) 20 MG tablet Take 20 mg by mouth daily.    metFORMIN (GLUCOPHAGE) 1000 MG tablet Take 1 tablet by mouth 2 (two) times daily.        If you experience worsening of your admission symptoms, develop shortness of breath, life threatening emergency, suicidal or homicidal thoughts you must seek medical attention immediately by calling 911 or calling your MD immediately  if symptoms less severe.  You Must read complete instructions/literature along with all the possible adverse reactions/side effects for all the Medicines you take and that have been prescribed to you. Take any  new Medicines after you have completely understood and accept all the possible adverse reactions/side effects.   Please note  You were cared for by a hospitalist during your hospital stay. If you have any questions about your discharge medications or the care you received while you were in the hospital after you are discharged, you can call the unit and asked to speak with the hospitalist on call if the hospitalist that took care of you is not available. Once you  are discharged, your primary care physician will handle any further medical issues. Please note that NO REFILLS for any discharge medications will be authorized once you are discharged, as it is imperative that you return to your primary care physician (or establish a relationship with a primary care physician if you do not have one) for your aftercare needs so that they can reassess your need for medications and monitor your lab values. Today   SUBJECTIVE    No complaints VITAL SIGNS:  Blood pressure 124/67, pulse 75, temperature 98 F (36.7 C), resp. rate 20, height  (1.727 m), weight 99.2 kg (218 lb 9.6 oz), SpO2 98 %.  I/O:    Intake/Output Summary (Last 24 hours) at 04/01/16 1621 Last data filed at 04/01/16 1440  Gross per 24 hour  Intake              240 ml  Output              150 ml  Net               90 ml    PHYSICAL EXAMINATION:  GENERAL:  65 y.o.-year-old patient lying in the bed with no acute distress.  EYES: Pupils equal, round, reactive to light and accommodation. No scleral icterus. Extraocular muscles intact.  HEENT: Head atraumatic, normocephalic. Oropharynx and nasopharynx clear.  NECK:  Supple, no jugular venous distention. No thyroid enlargement, no tenderness.  LUNGS: Normal breath sounds bilaterally, no wheezing, rales,rhonchi or crepitation. No use of accessory muscles of respiration.  CARDIOVASCULAR: S1, S2 normal. No murmurs, rubs, or gallops.  ABDOMEN: Soft, non-tender, non-distended. Bowel sounds present. No organomegaly or mass.  EXTREMITIES: No pedal edema, cyanosis, or clubbing.  NEUROLOGIC: Cranial nerves II through XII are intact. Muscle strength 5/5 in all extremities. Sensation intact. Gait not checked.  PSYCHIATRIC: The patient is alert and oriented x 3.  SKIN: No obvious rash, lesion, or ulcer.   DATA REVIEW:   CBC   Recent Labs Lab 04/01/16 0558  WBC 5.1  HGB 13.3  HCT 38.7*  PLT 225    Chemistries   Recent Labs Lab  04/01/16 0558  NA 135  K 5.2*  CL 103  CO2 25  GLUCOSE 285*  BUN 33*  CREATININE 1.24  CALCIUM 9.3    Microbiology Results   No results found for this or any previous visit (from the past 240 hour(s)).  RADIOLOGY:  Dg Chest 2 View  Result Date: 03/31/2016 CLINICAL DATA:  Left-sided chest pain and cough for 2 days. EXAM: CHEST  2 VIEW COMPARISON:  02/09/2015 FINDINGS: The heart is mildly enlarged with prominent left ventricular contour. The lungs are free of focal consolidations and pleural effusions. No pulmonary edema. IMPRESSION: 1. Mild cardiomegaly. 2.  No evidence for acute pulmonary abnormality. Electronically Signed   By: Norva Pavlov M.D.   On: 03/31/2016 17:01   Nm Myocar Multi W/spect W/wall Motion / Ef  Result Date: 04/01/2016  Blood pressure demonstrated a hypertensive response to  exercise.  There was no ST segment deviation noted during stress.  No T wave inversion was noted during stress.  This is an intermediate risk study.  The left ventricular ejection fraction is severely decreased (<30%) with severely dilated left ventricule  No evidence of perfusion defects. Possible non-ischemic cardiomyopathy.      Management plans discussed with the patient, family and they are in agreement.  CODE STATUS:     Code Status Orders        Start     Ordered   04/01/16 0000  Full code  Continuous     03/31/16 2359    Code Status History    Date Active Date Inactive Code Status Order ID Comments User Context   03/31/2016 11:59 PM 04/01/2016 12:47 PM Full Code [id number]  Oralia Manis, MD Inpatient      TOTAL TIME TAKING CARE OF THIS PATIENT: 40 minutes.    Josejuan Hoaglin M.D on 04/01/2016 at 4:21 PM  Between 7am to 6pm - Pager - [phone] After 6pm go to [email or web address] - password [email or web address] [facility]  [place]  [facility]  Office  [phone]  CC: Primary care physician; [facility]

## 2016-04-01 NOTE — Care Management (Signed)
Met with patient. He states he is active with [facility] care. He also gets his medications through them.

## 2016-04-01 NOTE — Discharge Instructions (Signed)
STOP SMOKING!!!!!! 

## 2016-04-01 NOTE — Progress Notes (Signed)
Inpatient Diabetes Program Recommendations  AACE/ADA: New Consensus Statement on Inpatient Glycemic Control (2015)  Target Ranges:  Prepandial:   less than 140 mg/dL      Peak postprandial:   less than 180 mg/dL (1-2 hours)      Critically ill patients:  140 - 180 mg/dL   Lab Results  Component Value Date   GLUCAP 274 (H) 04/01/2016    Review of Glycemic Control  Results for DENTON, LAMARCHE (MRN [id number]) as of 04/01/2016 09:13  Ref. Range 04/01/2016 00:04  Glucose-Capillary Latest Ref Range: 65 - 99 mg/dL 657 (H)     Diabetes history: Type 2 Outpatient Diabetes medications: Levemir 34 units qday, Glucotrol 10mg /day, Glucophage 1000mg  bid Current orders for Inpatient glycemic control: Novolog 0-9 units q6h  * steroids 40 mg qam  Inpatient Diabetes Program Recommendations:  Noted that the patient is NPO- please consider starting Levemir 19 units qam starting now (half home dose) and increase Novolog correction to moderate correction scale (0-15 units) q4h.   Susette Racer, RN, BA, MHA, CDE Diabetes Coordinator Inpatient Diabetes Program  [phone] (Team Pager) [phone] [facility] Office) 04/01/2016 9:18 AM

## 2016-04-01 NOTE — Progress Notes (Signed)
Patient is discharge in a stable condition , summary and f/u care given to both pt and wife , verbalized understanding, left with wife

## 2016-04-01 NOTE — Progress Notes (Signed)
*  PRELIMINARY RESULTS* Echocardiogram 2D Echocardiogram has been performed.  Cristela Blue 04/01/2016, 3:15 PM

## 2016-04-01 NOTE — Progress Notes (Signed)
   Patient was discharged today from [facility]. Noted to have presumed cardiomyopathy with severely depressed EF on nuclear stress test and echo. He was discharged on lisinopril and Coreg. He should continue these medications until he is seen in hospital follow up by [place]. He will need outpatient diagnostic cardiac cath to evaluate his cardiomyopathy per Dr. Kirke Corin. Please make sure patient has hospital follow up in 1-2 weeks with [place].

## 2016-04-01 NOTE — Consult Note (Signed)
Cardiology Consultation Note  Patient ID: Jorge Cruz, MRN: [id number], DOB/AGE: 01-22-1951 65 y.o. Admit date: 03/31/2016   Date of Consult: 04/01/2016 Primary Physician: No PCP Per Patient Primary Cardiologist: New to [facility] Requesting Physician: Dr. Anne Hahn, MD  Chief Complaint: Cough with chest pain associated with breating Reason for Consult: Atypical chest pain and elevated troponin in the   HPI: 65 y.o. 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 He was admitted for COPD exacerbation.Troponin was noted to be minimally elevated and down trending.  Prior stress testing in 2005 for syncope that was felt to be non-cardiac. Otherwise, no cardiac work up. Patient has had a productive cough of yellow sputum for 5 days. With his increased cough he developed chest pain that has been associated with deep inspiration and cough. No exertional symptoms. No SOB, diaphoresis, dizziness, presyncope, or syncope.   Upon the patient's arrival to [facility] they were found to have minimal troponin elevation of 0.08-->0.09-->0.06-->0.04, K+ 4.4-->5.2, SCr 1.23, hgb 13.3, wbc 5.1. ECG showed NSR, 88 bpm, no acute st/t changes , CXR showed cardiomegaly with not acute process. He is currently symptom free.    Past Medical History:  Diagnosis Date  . Diabetes mellitus without complication (HCC)   . Hypertension       Most Recent Cardiac Studies: Nuclear stress test 03/2014:  IMPRESSION: Possible mild ischemia, superimposed on small inferior wall infarct. Calculated left ventricular ejection fraction measured 36 %.  Echo 03/2014: FINDINGS: 1. The left ventricle is mildly dilated.  There is mild, global hypokinesis   with an estimated LVEF of 45%.  There are no segmental wall motion   abnormalities.  There is mild  concentric LVH. 2. The left atrium is mildly enlarged. 3. The right atrium and right ventricle are Jorge in size. 4. There is mild aortic sclerosis with no evidence of stenosis. 5. Mitral leaflets are pliable with trace insufficiency. 6. Tricuspid leaflets are pliable with trace insufficiency. 7. The pulmonic leaflets are pliable with trace insufficiency. 8. The interatrial septum and interventricular septum appear intact to   Color Doppler interrogation. 9. There is no pericardial effusion. 10. Doppler indicates no evidence of left ventricular outflow tract   gradient.  There is no systolic anterior motion of the mitral leaflets.  11. No etiology for syncope identified on this study.   Surgical History: History reviewed. No pertinent surgical history.   Home Meds: Prior to Admission medications   Medication Sig Start Date End Date Taking? Authorizing Provider  glipiZIDE (GLUCOTROL XL) 10 MG 24 hr tablet Take 10 mg by mouth daily.   Yes Historical Provider, MD  insulin detemir (LEVEMIR) 100 UNIT/ML injection Inject 34 Units into the skin at bedtime.   Yes Historical Provider, MD  lisinopril (PRINIVIL,ZESTRIL) 20 MG tablet Take 20 mg by mouth daily.   Yes Historical Provider, MD  metFORMIN (GLUCOPHAGE) 1000 MG tablet Take 1 tablet by mouth 2 (two) times daily.   Yes Historical Provider, MD  albuterol (PROVENTIL HFA;VENTOLIN HFA) 108 (90 Base) MCG/ACT inhaler Inhale 2 puffs into the lungs every 6 (six) hours as needed for wheezing or shortness of breath. 03/31/16   Governor Rooks, MD  predniSONE (DELTASONE) 10 MG tablet 50 mg daily for 4 more days. 03/31/16   Governor Rooks, MD    Inpatient  Medications:  . amLODipine  5 mg Oral Daily  . enoxaparin (LOVENOX) injection  40 mg Subcutaneous Q24H  . insulin aspart  0-9 Units Subcutaneous Q6H  . lisinopril  20 mg Oral Daily  . predniSONE  40 mg Oral Q breakfast  . sodium chloride flush  3 mL Intravenous Q12H      Allergies: No Known  Allergies  Social History   Social History  . Marital status: Married    Spouse name: N/A  . Number of children: N/A  . Years of education: N/A   Occupational History  . Not on file.   Social History Main Topics  . Smoking status: Current Every Day Smoker  . Smokeless tobacco: Never Used  . Alcohol use No  . Drug use: No  . Sexual activity: Not on file   Other Topics Concern  . Not on file   Social History Narrative  . No narrative on file     Family History  Problem Relation Age of Onset  . Diabetes Mother   . Diabetes Father      Review of Systems: Review of Systems  Constitutional: Positive for malaise/fatigue. Negative for chills, diaphoresis, fever and weight loss.  HENT: Negative for congestion.   Eyes: Negative for discharge and redness.  Respiratory: Positive for cough, sputum production, shortness of breath and wheezing. Negative for hemoptysis.   Cardiovascular: Positive for chest pain. Negative for palpitations, orthopnea, claudication, leg swelling and PND.  Gastrointestinal: Negative for abdominal pain, heartburn, nausea and vomiting.  Musculoskeletal: Negative for falls and myalgias.  Skin: Negative for rash.  Neurological: Negative for dizziness, tingling, tremors, sensory change, speech change, focal weakness, loss of consciousness and weakness.  Endo/Heme/Allergies: Does not bruise/bleed easily.  Psychiatric/Behavioral: Positive for substance abuse. The patient is not nervous/anxious.   All other systems reviewed and are negative.   Labs:  Recent Labs  03/31/16 1627 03/31/16 1902 04/01/16 0117 04/01/16 0558  TROPONINI 0.08* 0.09* 0.06* 0.04*   Lab Results  Component Value Date   WBC 5.1 04/01/2016   HGB 13.3 04/01/2016   HCT 38.7 (L) 04/01/2016   MCV 93.5 04/01/2016   PLT 225 04/01/2016     Recent Labs Lab 04/01/16 0558  NA 135  K 5.2*  CL 103  CO2 25  BUN 33*  CREATININE 1.24  CALCIUM 9.3  GLUCOSE 285*   No results  found for: CHOL, HDL, LDLCALC, TRIG No results found for: DDIMER  Radiology/Studies:  Dg Chest 2 View  Result Date: 03/31/2016 CLINICAL DATA:  Left-sided chest pain and cough for 2 days. EXAM: CHEST  2 VIEW COMPARISON:  02/09/2015 FINDINGS: The heart is mildly enlarged with prominent left ventricular contour. The lungs are free of focal consolidations and pleural effusions. No pulmonary edema. IMPRESSION: 1. Mild cardiomegaly. 2.  No evidence for acute pulmonary abnormality. Electronically Signed   By: Norva Pavlov M.D.   On: 03/31/2016 17:01    EKG: Interpreted by me showed: NSR, 88 bpm, no acute st/t changes  Telemetry: Interpreted by me showed: NSR, 70's bpm  Weights: Filed Weights   03/31/16 1625 04/01/16 0002  Weight: 222 lb (100.7 kg) 218 lb 9.6 oz (99.2 kg)     Physical Exam: Blood pressure (!) 146/66, pulse 77, temperature 97.9 F (36.6 C), resp. rate 18, height  (1.727 m), weight 218 lb 9.6 oz (99.2 kg), SpO2 94 %. Body mass index is 33.24 kg/m. General: Well developed, well nourished, in no acute distress. Head: Normocephalic, atraumatic,  sclera non-icteric, no xanthomas, nares are without discharge.  Neck: Negative for carotid bruits. JVD not elevated. Lungs: Diffuse expiratory wheezing with coarse breath sounds throughout. Breathing is unlabored. Heart: RRR with S1 S2. No murmurs, rubs, or gallops appreciated. Abdomen: Soft, non-tender, non-distended with normoactive bowel sounds. No hepatomegaly. No rebound/guarding. No obvious abdominal masses. Msk:  Strength and tone appear Jorge for age. Extremities: No clubbing or cyanosis. No edema. Distal pedal pulses are 2+ and equal bilaterally. Neuro: Alert and oriented X 3. No facial asymmetry. No focal deficit. Moves all extremities spontaneously. Psych:  Responds to questions appropriately with a Jorge affect.    Assessment and Plan:  Principal Problem:   COPD exacerbation (HCC) Active Problems:   Atypical  chest pain   Elevated troponin   Diabetes (HCC)   Essential hypertension   HLD (hyperlipidemia)    1. Elevated troponin/atypical chest pain: -Pain is sharp and only associated with deep inspiration -Minimal troponin elevation that is down trending  -Both the troponin elevation and atypical chest pain are likely due to the patient's COPD exacerbation  -He does not want an inpatient stress test as he is ready to be discharged -Can pursue outpatient stress testing -Echo is pending  2. COPD exacerbation: -Likely leading to the above -Per IM  3. HTN: -Uncontrolled -Add amlodipine  4. HLD: -Check lipid panel   5. DM2: -Per IM   Signed, Eula Listen, PA-C [facility] Pager: [phone] 04/01/2016, 8:23 AM

## 2016-04-04 NOTE — Progress Notes (Signed)
Pt is now coming 04/21/16 to see Ward Givens.  Pt is aware

## 2016-04-21 ENCOUNTER — Encounter: Payer: Self-pay | Admitting: *Deleted

## 2016-04-21 ENCOUNTER — Encounter: Payer: Self-pay | Admitting: Nurse Practitioner

## 2016-04-21 NOTE — Progress Notes (Deleted)
Office Visit    Patient Name: Jorge Cruz Date of Encounter: 04/21/2016  Primary Care Provider:  [facility] Primary Cardiologist:  M. Kirke Corin, MD   Chief Complaint    65 y/o ? with a h/o NICM and recent admission for c/p in the setting of bronchitis, who presents for f/u.  Past Medical History    Past Medical History:  Diagnosis Date  . Chronic systolic CHF (congestive heart failure) (HCC)    a. 03/2014 Echo: EF 45%, glob HK; b. 03/2016 Echo: EF 20-25%, diff HK, Gr1 DD, mild MR.  Marland Kitchen COPD (chronic obstructive pulmonary disease) (HCC)   . Diabetes mellitus without complication (HCC)   . Hypertensive heart disease   . NICM (nonischemic cardiomyopathy) (HCC)    a. 03/2014 Echo: EF 45%, glob HK, mild conc LVH; b. 03/2014 MV: possible mild ischemia, superimposed on small inf infarct, EF 36%; c. 03/2016 MV: EF <30%, no ischemia; d. 03/2016 Echo: EF 20-25%, diff HK, Gr1 DD, mild MR.  . Tobacco abuse    No past surgical history on file.  Allergies  No Known Allergies  History of Present Illness    65 y/o ? with a h/o HTN, HL, DM, obesity, syncope (2005), tob abuse, COPD, and NICM.  He previously underwent stress testing and echo in 2015, revealing mild LV dysfxn w/ an EF of 45%.  Myoview showed a possible area of mild ischemia w/ small inf wall infarct.  He was apparently medically managed.  More recently, he was admitted to [facility] with AECOPD/bronchitis and atypical chest pain. Trop was mildly elevated.  Myoview was undertaken and notable for LV dysfxn w/o ischemia.  Echo confirmed LV dysfxn, with an EF of 20-25% and diff HK.  He was d/c'd on  blocker and acei.  ***  Home Medications    Prior to Admission medications   Medication Sig Start Date End Date Taking? Authorizing Provider  albuterol (PROVENTIL HFA;VENTOLIN HFA) 108 (90 Base) MCG/ACT inhaler Inhale 2 puffs into the lungs every 6 (six) hours as needed for wheezing or shortness of breath. 04/01/16   Enedina Finner, MD    aspirin EC 81 MG tablet Take 1 tablet (81 mg total) by mouth daily. 04/01/16   Enedina Finner, MD  carvedilol (COREG) 6.25 MG tablet Take 1 tablet (6.25 mg total) by mouth 2 (two) times daily with a meal. 04/01/16   Enedina Finner, MD  glipiZIDE (GLUCOTROL XL) 10 MG 24 hr tablet Take 10 mg by mouth daily.    Historical Provider, MD  insulin detemir (LEVEMIR) 100 UNIT/ML injection Inject 34 Units into the skin at bedtime.    Historical Provider, MD  lisinopril (PRINIVIL,ZESTRIL) 20 MG tablet Take 20 mg by mouth daily.    Historical Provider, MD  metFORMIN (GLUCOPHAGE) 1000 MG tablet Take 1 tablet by mouth 2 (two) times daily.    Historical Provider, MD  predniSONE (DELTASONE) 10 MG tablet 50 mg daily for 4 more days. 04/01/16   Enedina Finner, MD    Review of Systems    ***.  All other systems reviewed and are otherwise negative except as noted above.  Physical Exam    VS:  There were no vitals taken for this visit. , BMI There is no height or weight on file to calculate BMI. GEN: Well nourished, well developed, in no acute distress.  HEENT: normal.  Neck: Supple, no JVD, carotid bruits, or masses. Cardiac: RRR, no murmurs, rubs, or gallops. No clubbing, cyanosis, edema.  Radials/DP/PT 2+ and equal bilaterally.  Respiratory:  Respirations regular and unlabored, clear to auscultation bilaterally. GI: Soft, nontender, nondistended, BS + x 4. MS: no deformity or atrophy. Skin: warm and dry, no rash. Neuro:  Strength and sensation are intact. Psych: Normal affect.  Accessory Clinical Findings    ECG - ***  Assessment & Plan    1.  ***   Jorge Duckinghristopher Carleah Yablonski, NP 04/21/2016, 12:16 PM

## 2016-05-03 ENCOUNTER — Ambulatory Visit (INDEPENDENT_AMBULATORY_CARE_PROVIDER_SITE_OTHER): Payer: Self-pay | Admitting: Internal Medicine

## 2016-05-03 ENCOUNTER — Encounter: Payer: Self-pay | Admitting: Internal Medicine

## 2016-05-03 ENCOUNTER — Encounter (INDEPENDENT_AMBULATORY_CARE_PROVIDER_SITE_OTHER): Payer: Self-pay

## 2016-05-03 VITALS — BP 134/72 | HR 83 | Ht 68.0 in | Wt 229.8 lb

## 2016-05-03 DIAGNOSIS — I5021 Acute systolic (congestive) heart failure: Secondary | ICD-10-CM

## 2016-05-03 DIAGNOSIS — E785 Hyperlipidemia, unspecified: Secondary | ICD-10-CM

## 2016-05-03 DIAGNOSIS — I10 Essential (primary) hypertension: Secondary | ICD-10-CM

## 2016-05-03 DIAGNOSIS — R0789 Other chest pain: Secondary | ICD-10-CM

## 2016-05-03 MED ORDER — FUROSEMIDE 20 MG PO TABS: 20.0000 mg | ORAL_TABLET | Freq: Every day | ORAL | 6 refills | Status: DC

## 2016-05-03 MED ORDER — ATORVASTATIN CALCIUM 40 MG PO TABS: 40.0000 mg | ORAL_TABLET | Freq: Every day | ORAL | 6 refills | Status: DC

## 2016-05-03 MED ORDER — CARVEDILOL 12.5 MG PO TABS: 12.5000 mg | ORAL_TABLET | Freq: Two times a day (BID) | ORAL | 6 refills | Status: DC

## 2016-05-03 NOTE — Patient Instructions (Addendum)
Medication Instructions:   START TAKING CARVEDILOL 12.5 MG TWICE A DAY   START TAKING LIPITOR 40 MG ONCE A DAY  START TAKING LASIX  20 MG ONCE A DAY    If you need a refill on your cardiac medications before your next appointment, please call your pharmacy.  Labwork: BMET TODAY    Testing/Procedures:  A WEEK BEFORE FOLLOW UP RETURN VISIT Your physician has requested that you have an echocardiogram. Echocardiography is a painless test that uses sound waves to create images of your heart. It provides your doctor with information about the size and shape of your heart and how well your heart's chambers and valves are working. This procedure takes approximately one hour. There are no restrictions for this procedure.     Follow-Up: IN 2 MONTHS WITH DR [name] END   Any Other Special Instructions Will Be Listed Below (If Applicable).

## 2016-05-03 NOTE — Progress Notes (Signed)
Follow-up Outpatient Visit Date: 05/03/2016  Chief Complaint  Patient presents with  . Other    Follow [facility] from [facility] & discuss stress test. Denies chest pain nor shortness of breath since hospital visit. Meds reviewed by the patient verbally.      HPI:  [name] is a 65 y.o. year-old male with history of recently diagnosed cardiomyopathy (LVEF 20-25% by echo), hypertension, and diabetes mellitus, who presents for follow-up of recent hospitalization in 03/2016 with chest pain and newly diagnosed cardiomyopathy. The patient presented with several days of atypical chest pain with accompanying cough and wheezing. EKG revealed nonspecific findings. Serial troponins were minimally elevated. Patient was initially admitted for suspected COPD exacerbation. However, subsequent echocardiogram revealed severely reduced LV systolic function. Myocardial perfusion stress test confirmed severely reduced LVEF that showed no evidence of ischemia or scar. Prior workup in 2015 was notable for an echo demonstrating mildly reduced contraction and myocardial perfusion stress test revealing possible mild ischemia with small superimposed infarct involving the inferior wall. He was discharged on carvedilol and lisinopril.  Today, [name] reports that he feels relatively well. He has some shortness of breath with exertion when walking about 1/4 mile. He otherwise feels well. He has rare episodes of brief chest pain lasting one to 2 minutes. He describes these as a shocklike sensation in the left chest. They can occur randomly without exertional component and typically resolve on her own. He denies any associated symptoms including dyspnea, nausea, diaphoresis. The pain is 6/10 in intensity. They happen about once every 2 weeks. He denies lower extremity edema, as well as palpitations and lightheadedness. He is tolerating medical therapy with carvedilol and lisinopril well.  He does not weigh himself regularly andthat his  weight was increased today. He has occasional abdominal bloating and constipation. His wife also notes that Jorge Cruz snores.  --------------------------------------------------------------------------------------------------  Past Medical History:  Diagnosis Date  . Chronic systolic CHF (congestive heart failure) (HCC)    a. 03/2014 Echo: EF 45%, glob HK; b. 03/2016 Echo: EF 20-25%, diff HK, Gr1 DD, mild MR.  Marland Kitchen. COPD (chronic obstructive pulmonary disease) (HCC)   . Diabetes mellitus without complication (HCC)   . Hypertensive heart disease   . NICM (nonischemic cardiomyopathy) (HCC)    a. 03/2014 Echo: EF 45%, glob HK, mild conc LVH; b. 03/2014 MV: possible mild ischemia, superimposed on small inf infarct, EF 36%; c. 03/2016 MV: EF <30%, no ischemia; d. 03/2016 Echo: EF 20-25%, diff HK, Gr1 DD, mild MR.  . Tobacco abuse     History reviewed. No pertinent surgical history.  Outpatient Encounter Prescriptions as of 05/03/2016  Medication Sig  . aspirin EC 81 MG tablet Take 1 tablet (81 mg total) by mouth daily.  Marland Kitchen. atorvastatin (LIPITOR) 40 MG tablet Take 1 tablet (40 mg total) by mouth daily.  . carvedilol (COREG) 12.5 MG tablet Take 1 tablet (12.5 mg total) by mouth 2 (two) times daily with a meal.  . glipiZIDE (GLUCOTROL XL) 10 MG 24 hr tablet Take 10 mg by mouth daily.  . insulin detemir (LEVEMIR) 100 UNIT/ML injection Inject 34 Units into the skin at bedtime.  Marland Kitchen. lisinopril (PRINIVIL,ZESTRIL) 20 MG tablet Take 20 mg by mouth daily.  . metFORMIN (GLUCOPHAGE) 1000 MG tablet Take 1 tablet by mouth 2 (two) times daily.  . [DISCONTINUED] atorvastatin (LIPITOR) 20 MG tablet Take 20 mg by mouth daily.  . [DISCONTINUED] carvedilol (COREG) 6.25 MG tablet Take 1 tablet (6.25 mg total) by mouth 2 (  two) times daily with a meal.  . albuterol (PROVENTIL HFA;VENTOLIN HFA) 108 (90 Base) MCG/ACT inhaler Inhale 2 puffs into the lungs every 6 (six) hours as needed for wheezing or shortness of breath. (Patient  not taking: Reported on 05/03/2016)  . furosemide (LASIX) 20 MG tablet Take 1 tablet (20 mg total) by mouth daily.  . [DISCONTINUED] predniSONE (DELTASONE) 10 MG tablet 50 mg daily for 4 more days. (Patient not taking: Reported on 05/03/2016)   No facility-administered encounter medications on file as of 05/03/2016.     Allergies: Review of patient's allergies indicates no known allergies.  Social History   Social History  . Marital status: Married    Spouse name: N/A  . Number of children: N/A  . Years of education: N/A   Occupational History  . Not on file.   Social History Main Topics  . Smoking status: Current Every Day Smoker    Packs/day: 1.00    Years: 50.00  . Smokeless tobacco: Never Used  . Alcohol use No  . Drug use: No  . Sexual activity: Not on file   Other Topics Concern  . Not on file   Social History Narrative  . No narrative on file    Family History  Problem Relation Age of Onset  . Diabetes Mother   . Diabetes Father     Review of Systems: A 12-system review of systems was performed and was negative except as noted in the HPI.  --------------------------------------------------------------------------------------------------  Physical Exam: BP 134/72 (BP Location: Left Arm, Patient Position: Sitting, Cuff Size: Normal)   Pulse 83   Ht 5\' 8"  (1.727 m)   Wt 229 lb 12 oz (104.2 kg)   BMI 34.93 kg/m   General:  Obese man, seated comfortably in the exam room. He is accompanied by his wife HEENT: No conjunctival pallor or scleral icterus.  Moist mucous membranes.  OP clear. Neck: Supple without lymphadenopathy, thyromegaly.  JVP is approximately 10-12 cm with positive HJR. No carotid bruit. Lungs: Normal work of breathing.  Clear to auscultation bilaterally without wheezes or crackles. CV: Distant heart sounds with regular rate and rhythm without murmurs, rubs, or gallops.  Non-displaced PMI. Abd: Bowel sounds present.  Abdomen is slightly firm  without guarding. No obvious distention or tenderness. Unable to assess hepatosplenomegaly due to body habitus. Ext: Trace pretibial edema bilaterally.  Radial, PT, and DP pulses are 2+ bilaterally. Skin: warm and dry without rash   Lab Results  Component Value Date   WBC 5.1 04/01/2016   HGB 13.3 04/01/2016   HCT 38.7 (L) 04/01/2016   MCV 93.5 04/01/2016   PLT 225 04/01/2016    Lab Results  Component Value Date   NA 135 04/01/2016   K 5.2 (H) 04/01/2016   CL 103 04/01/2016   CO2 25 04/01/2016   BUN 33 (H) 04/01/2016   CREATININE 1.24 04/01/2016   GLUCOSE 285 (H) 04/01/2016    Lab Results  Component Value Date   CHOL 151 04/01/2016   HDL 46 04/01/2016   LDLCALC 99 04/01/2016   TRIG 31 04/01/2016   CHOLHDL 3.3 04/01/2016    --------------------------------------------------------------------------------------------------  ASSESSMENT AND PLAN: 1. Acute systolic congestive heart failure (HCC) The patient appears slightly volume overloaded but otherwise well compensated with NYHA class II symptoms in the setting of recently diagnosed severe systolic heart failure. Though his myocardial perfusion stress test during recent hospitalization was without clear evidence of ischemia, we discussed the potential for a false negative. Given his  severe LV dysfunction, I recommended that we proceed with cardiac catheterization. However, the patient did not wish to undergocatheterization, opting instead for continued medical therapy. We will continue lisinopril 20 mg daily and increase carvedilol to 12.5 mg twice a day. I will check a basic metabolic panel today to ensure renal function and electrolytes are stable. Given that he has mild fluid retention, we had agreed to start furosemide 20 mg daily. We will perform an echocardiogram shortly before the patient returns for follow-up in 2 months to reassess his LV function.  2. Atypical chest pain Chest pain is atypical and nonexertional. Recent  stress test is somewhat reassuring, though in light of his severe cardiomyopathy, he could also have balanced ischemia. I think it would be reasonable to perform a cardiac catheterization, but the patient declined. We will continue with low-dose aspirin and increase atorvastatin 40 mg daily. As above, we have also increased carvedilol to 12.5 mg twice a day. The patient was advised to seek immediate medical attention if his symptoms worsen.  3. Essential hypertension Blood pressure upper normal to mildly elevated given his history of diabetes. We will increase carvedilol, as above.  4. HLD (hyperlipidemia) Though the patient does not have known CAD, he is certainly at risk given his history of diabetes and hypertension. We have therefore agreed to increase atorvastatin 40 mg daily.  Follow-up: Return in about 2 months (around 07/03/2016).  Yvonne Kendall, MD 05/03/2016 12:39 PM

## 2016-05-04 LAB — BASIC METABOLIC PANEL
BUN/Creatinine Ratio: 19 (ref 10–24)
BUN: 21 mg/dL (ref 8–27)
CO2: 23 mmol/L (ref 18–29)
Calcium: 9.4 mg/dL (ref 8.6–10.2)
Chloride: 101 mmol/L (ref 96–106)
Creatinine, Ser: 1.12 mg/dL (ref 0.76–1.27)
GFR calc Af Amer: 80 mL/min/{1.73_m2} (ref >59)
GFR calc non Af Amer: 69 mL/min/{1.73_m2} (ref >59)
Glucose: 241 mg/dL — ABNORMAL HIGH (ref 65–99)
Potassium: 5 mmol/L (ref 3.5–5.2)
Sodium: 141 mmol/L (ref 134–144)

## 2016-06-20 ENCOUNTER — Encounter: Payer: Self-pay | Admitting: *Deleted

## 2016-06-20 ENCOUNTER — Other Ambulatory Visit: Payer: Self-pay

## 2016-06-29 ENCOUNTER — Ambulatory Visit: Payer: Self-pay | Admitting: Internal Medicine

## 2016-06-29 ENCOUNTER — Encounter: Payer: Self-pay | Admitting: *Deleted

## 2016-06-29 NOTE — Progress Notes (Deleted)
Follow-up Outpatient Visit Date: 06/29/2016  Chief Complaint: ***  HPI:  Jorge Cruz is a 65 y.o. year-old male with history of ***, who presents for follow-up of ***.  --------------------------------------------------------------------------------------------------  Cardiovascular History & Procedures: Cardiovascular Problems:  ***  Risk Factors:  ***  Cath/PCI:  ***  CV Surgery:  ***  EP Procedures and Devices:  ***  Non-Invasive Evaluation(s):  ***  Recent CV Pertinent Labs: Lab Results  Component Value Date   CHOL 151 04/01/2016   HDL 46 04/01/2016   LDLCALC 99 04/01/2016   TRIG 31 04/01/2016   CHOLHDL 3.3 04/01/2016   K 5.0 05/03/2016   BUN 21 05/03/2016   CREATININE 1.12 05/03/2016     Past medical and surgical history were reviewed and updated in EPIC.   Outpatient Encounter Prescriptions as of 06/29/2016  Medication Sig  . albuterol (PROVENTIL HFA;VENTOLIN HFA) 108 (90 Base) MCG/ACT inhaler Inhale 2 puffs into the lungs every 6 (six) hours as needed for wheezing or shortness of breath. (Patient not taking: Reported on 05/03/2016)  . aspirin EC 81 MG tablet Take 1 tablet (81 mg total) by mouth daily.  Marland Kitchen atorvastatin (LIPITOR) 40 MG tablet Take 1 tablet (40 mg total) by mouth daily.  . carvedilol (COREG) 12.5 MG tablet Take 1 tablet (12.5 mg total) by mouth 2 (two) times daily with a meal.  . furosemide (LASIX) 20 MG tablet Take 1 tablet (20 mg total) by mouth daily.  Marland Kitchen glipiZIDE (GLUCOTROL XL) 10 MG 24 hr tablet Take 10 mg by mouth daily.  . insulin detemir (LEVEMIR) 100 UNIT/ML injection Inject 34 Units into the skin at bedtime.  Marland Kitchen lisinopril (PRINIVIL,ZESTRIL) 20 MG tablet Take 20 mg by mouth daily.  . metFORMIN (GLUCOPHAGE) 1000 MG tablet Take 1 tablet by mouth 2 (two) times daily.   No facility-administered encounter medications on file as of 06/29/2016.     Allergies: Patient has no known allergies.  Social History   Social History  .  Marital status: Married    Spouse name: N/A  . Number of children: N/A  . Years of education: N/A   Occupational History  . Not on file.   Social History Main Topics  . Smoking status: Current Every Day Smoker    Packs/day: 1.00    Years: 50.00  . Smokeless tobacco: Never Used  . Alcohol use No  . Drug use: No  . Sexual activity: Not on file   Other Topics Concern  . Not on file   Social History Narrative  . No narrative on file    Family History  Problem Relation Age of Onset  . Diabetes Mother   . Diabetes Father     Review of Systems: A 12-system review of systems was performed and was negative except as noted in the HPI.  --------------------------------------------------------------------------------------------------  Physical Exam: There were no vitals taken for this visit.  General:  *** HEENT: No conjunctival pallor or scleral icterus.  Moist mucous membranes.  OP clear. Neck: Supple without lymphadenopathy, thyromegaly, JVD, or HJR.  No carotid bruit. Lungs: Normal work of breathing.  Clear to auscultation bilaterally without wheezes or crackles. Heart: Regular rate and rhythm without murmurs, rubs, or gallops.  Non-displaced PMI. Abd: Bowel sounds present.  Soft, NT/ND without hepatosplenomegaly Ext: No lower extremity edema.  Radial, PT, and DP pulses are 2+ bilaterally. Skin: warm and dry without rash  EKG:  ***  Lab Results  Component Value Date   WBC 5.1 04/01/2016  HGB 13.3 04/01/2016   HCT 38.7 (L) 04/01/2016   MCV 93.5 04/01/2016   PLT 225 04/01/2016    Lab Results  Component Value Date   NA 141 05/03/2016   K 5.0 05/03/2016   CL 101 05/03/2016   CO2 23 05/03/2016   BUN 21 05/03/2016   CREATININE 1.12 05/03/2016   GLUCOSE 241 (H) 05/03/2016    Lab Results  Component Value Date   CHOL 151 04/01/2016   HDL 46 04/01/2016   LDLCALC 99 04/01/2016   TRIG 31 04/01/2016   CHOLHDL 3.3 04/01/2016     --------------------------------------------------------------------------------------------------  ASSESSMENT AND PLAN: [name] Carl Butner, MD 06/29/2016 8:56 AM

## 2016-07-05 ENCOUNTER — Encounter: Payer: Self-pay | Admitting: Internal Medicine

## 2016-07-13 ENCOUNTER — Encounter: Payer: Self-pay | Admitting: Internal Medicine

## 2016-08-11 ENCOUNTER — Encounter: Payer: Self-pay | Admitting: Cardiovascular Disease

## 2016-12-28 ENCOUNTER — Encounter: Payer: Self-pay | Admitting: Emergency Medicine

## 2016-12-28 ENCOUNTER — Emergency Department
Admission: EM | Admit: 2016-12-28 | Discharge: 2016-12-28 | Disposition: A | Discharge status: Home / Self Care | Payer: Medicaid Other | Attending: Emergency Medicine | Admitting: Emergency Medicine

## 2016-12-28 DIAGNOSIS — F17200 Nicotine dependence, unspecified, uncomplicated: Secondary | ICD-10-CM | POA: Diagnosis not present

## 2016-12-28 DIAGNOSIS — Z7982 Long term (current) use of aspirin: Secondary | ICD-10-CM | POA: Diagnosis not present

## 2016-12-28 DIAGNOSIS — L03314 Cellulitis of groin: Secondary | ICD-10-CM

## 2016-12-28 DIAGNOSIS — J449 Chronic obstructive pulmonary disease, unspecified: Secondary | ICD-10-CM | POA: Diagnosis not present

## 2016-12-28 DIAGNOSIS — I5022 Chronic systolic (congestive) heart failure: Secondary | ICD-10-CM | POA: Insufficient documentation

## 2016-12-28 DIAGNOSIS — I110 Hypertensive heart disease with heart failure: Secondary | ICD-10-CM | POA: Insufficient documentation

## 2016-12-28 DIAGNOSIS — I10 Essential (primary) hypertension: Secondary | ICD-10-CM | POA: Insufficient documentation

## 2016-12-28 DIAGNOSIS — F17210 Nicotine dependence, cigarettes, uncomplicated: Secondary | ICD-10-CM | POA: Diagnosis not present

## 2016-12-28 DIAGNOSIS — L089 Local infection of the skin and subcutaneous tissue, unspecified: Secondary | ICD-10-CM | POA: Diagnosis present

## 2016-12-28 DIAGNOSIS — E119 Type 2 diabetes mellitus without complications: Secondary | ICD-10-CM | POA: Insufficient documentation

## 2016-12-28 DIAGNOSIS — Z794 Long term (current) use of insulin: Secondary | ICD-10-CM | POA: Diagnosis not present

## 2016-12-28 DIAGNOSIS — Z79899 Other long term (current) drug therapy: Secondary | ICD-10-CM | POA: Diagnosis not present

## 2016-12-28 DIAGNOSIS — L03311 Cellulitis of abdominal wall: Secondary | ICD-10-CM | POA: Diagnosis not present

## 2016-12-28 MED ORDER — SULFAMETHOXAZOLE-TRIMETHOPRIM 800-160 MG PO TABS: 1.0000 | ORAL_TABLET | Freq: Two times a day (BID) | ORAL | 0 refills | Status: DC

## 2016-12-28 MED ORDER — SULFAMETHOXAZOLE-TRIMETHOPRIM 800-160 MG PO TABS
1.0000 | ORAL_TABLET | Freq: Once | ORAL | Status: AC
  Administered 2016-12-28: 1 via ORAL
  Filled 2016-12-28: qty 1

## 2016-12-28 NOTE — ED Provider Notes (Signed)
[facility] Emergency Department Provider Note ____________________________________________  Time seen: 2032  I have reviewed the triage vital signs and the nursing notes.  HISTORY  Chief Complaint  Cyst  HPI Jorge Cruz is a 65 y.o. male presented to the ED for evaluation and management of a cyst to the left side of his groin noted the last 4 days. Patient does admit to a small pustule there prior to onset, that he intermittently squeeze. He had a small amount of purulence from it at this time as had increased swelling, firmness, and redness. He denies any fevers, chills, sweats. She denies any spontaneous drainage. Please give a history of previous abscesses, boils, or MRSA.  Past Medical History:  Diagnosis Date  . Chronic systolic CHF (congestive heart failure) (HCC)    a. 03/2014 Echo: EF 45%, glob HK; b. 03/2016 Echo: EF 20-25%, diff HK, Gr1 DD, mild MR.  Marland Kitchen COPD (chronic obstructive pulmonary disease) (HCC)   . Diabetes mellitus without complication (HCC)   . Hypertensive heart disease   . NICM (nonischemic cardiomyopathy) (HCC)    a. 03/2014 Echo: EF 45%, glob HK, mild conc LVH; b. 03/2014 MV: possible mild ischemia, superimposed on small inf infarct, EF 36%; c. 03/2016 MV: EF <30%, no ischemia; d. 03/2016 Echo: EF 20-25%, diff HK, Gr1 DD, mild MR.  . Tobacco abuse     Patient Active Problem List   Diagnosis Date Noted  . COPD exacerbation (HCC) 04/01/2016  . Essential hypertension 04/01/2016  . HLD (hyperlipidemia) 04/01/2016  . Atypical chest pain 03/31/2016  . Elevated troponin 03/31/2016  . Diabetes (HCC) 03/31/2016    History reviewed. No pertinent surgical history.  Prior to Admission medications   Medication Sig Start Date End Date Taking? Authorizing Provider  albuterol (PROVENTIL HFA;VENTOLIN HFA) 108 (90 Base) MCG/ACT inhaler Inhale 2 puffs into the lungs every 6 (six) hours as needed for wheezing or shortness of breath. Patient not taking:  Reported on 05/03/2016 04/01/16   Enedina Finner, MD  aspirin EC 81 MG tablet Take 1 tablet (81 mg total) by mouth daily. 04/01/16   Enedina Finner, MD  atorvastatin (LIPITOR) 40 MG tablet Take 1 tablet (40 mg total) by mouth daily. 05/03/16   End, [name], MD  carvedilol (COREG) 12.5 MG tablet Take 1 tablet (12.5 mg total) by mouth 2 (two) times daily with a meal. 05/03/16   End, [name], MD  furosemide (LASIX) 20 MG tablet Take 1 tablet (20 mg total) by mouth daily. 05/03/16 08/01/16  End, [name], MD  glipiZIDE (GLUCOTROL XL) 10 MG 24 hr tablet Take 10 mg by mouth daily.    [provider]  insulin detemir (LEVEMIR) 100 UNIT/ML injection Inject 34 Units into the skin at bedtime.    [provider]  lisinopril (PRINIVIL,ZESTRIL) 20 MG tablet Take 20 mg by mouth daily.    [provider]  metFORMIN (GLUCOPHAGE) 1000 MG tablet Take 1 tablet by mouth 2 (two) times daily.    [provider]  sulfamethoxazole-trimethoprim (BACTRIM DS,SEPTRA DS) 800-160 MG tablet Take 1 tablet by mouth 2 (two) times daily. 12/28/16   Ludene Stokke, Charlesetta Ivory, PA-C    Allergies Patient has no known allergies.  Family History  Problem Relation Age of Onset  . Diabetes Mother   . Diabetes Father     Social History Social History  Substance Use Topics  . Smoking status: Current Every Day Smoker    Packs/day: 1.00    Years: 50.00  . Smokeless  tobacco: Never Used  . Alcohol use No    Review of Systems  Constitutional: Negative for fever.  Cardiovascular: Negative for chest pain. Respiratory: Negative for shortness of breath. Gastrointestinal: Negative for abdominal pain, vomiting and diarrhea. Genitourinary: Negative for dysuria. Musculoskeletal: Negative for back pain. Skin: Negative for rash.  Groin cyst as above Neurological: Negative for headaches, focal weakness or numbness. ____________________________________________  PHYSICAL EXAM:  VITAL SIGNS: ED Triage  Vitals  Enc Vitals Group     BP 12/28/16 1947 (!) 150/66     Pulse Rate 12/28/16 1947 86     Resp 12/28/16 1947 18     Temp 12/28/16 1947 98.3 F (36.8 C)     Temp Source 12/28/16 1947 Oral     SpO2 12/28/16 1947 97 %     Weight 12/28/16 1948 218 lb (98.9 kg)     Height 12/28/16 1948 5\' 8"  (1.727 m)     Head Circumference --      Peak Flow --      Pain Score 12/28/16 2016 7     Pain Loc --      Pain Edu? --      Excl. in GC? --     Constitutional: Alert and oriented. Well appearing and in no distress. Head: Normocephalic and atraumatic. Hematological/Lymphatic/Immunological: No inguinal lymphadenopathy. Cardiovascular: Normal rate, regular rhythm. Normal distal pulses. Respiratory: Normal respiratory effort. No wheezes/rales/rhonchi. Gastrointestinal: Soft and nontender. No distention. Musculoskeletal: Nontender with normal range of motion in all extremities.  Neurologic:  Normal gait without ataxia. Normal speech and language. No gross focal neurologic deficits are appreciated. Skin:  Skin is warm, dry and intact. No rash noted. Firm area subcutaneous induration with local surrounding erythema. Central scabbed punctum noted. No spontaneous drainage or fluctuance noted.  ____________________________________________  EKG  Sinus Rhythm w/ Frequent PVCs No STEMI ____________________________________________  PROCEDURES  Bactrim DS i PO ____________________________________________  INITIAL IMPRESSION / ASSESSMENT AND PLAN / ED COURSE  Patient with an acute cellulitis to the left groin on presentation. No appreciable fluctuance or spontaneous drainage to indicate need for I&D procedure today. He will be discharged with Bactrim and instructions to apply warm compresses. He should follow up with primary care provider or return to the ED for wound check and probable I&D procedure.  ____________________________________________  FINAL CLINICAL IMPRESSION(S) / ED DIAGNOSES  Final  diagnoses:  Cellulitis of groin      Tennelle Taflinger, Charlesetta Ivory, PA-C 12/28/16 2141    Phineas Semen, MD 12/28/16 [phone]

## 2016-12-28 NOTE — ED Notes (Signed)
Pt pulse registering irregular via dinamap and radial pulse, EKG performed and shown to Dr. Derrill Kay. No further verbal orders given. Pt denies hx of a-fib. Pt denies any symptoms.

## 2016-12-28 NOTE — ED Notes (Signed)
Pt reports that he has a cyst on left lower abd - area has been present for 4 days - area is hard and painful to touch - pt denies any drainage

## 2016-12-28 NOTE — Discharge Instructions (Signed)
Keep the wound clean and covered. Apply warm compresses to promote healing. Take the antibiotic as directed until all pills are gone. Follow-up with your provider for ongoing symptoms. Return to the ED as needed.

## 2016-12-28 NOTE — ED Triage Notes (Signed)
Pt ambulatory to triage with steady gait with c/o cyst on left groin x 4 days.

## 2017-03-19 ENCOUNTER — Emergency Department
Admission: EM | Admit: 2017-03-19 | Discharge: 2017-03-19 | Disposition: A | Discharge status: Home / Self Care | Payer: Medicaid Other | Attending: Emergency Medicine | Admitting: Emergency Medicine

## 2017-03-19 ENCOUNTER — Encounter: Payer: Self-pay | Admitting: Emergency Medicine

## 2017-03-19 ENCOUNTER — Emergency Department: Payer: Medicaid Other

## 2017-03-19 DIAGNOSIS — W57XXXA Bitten or stung by nonvenomous insect and other nonvenomous arthropods, initial encounter: Secondary | ICD-10-CM | POA: Insufficient documentation

## 2017-03-19 DIAGNOSIS — S30860A Insect bite (nonvenomous) of lower back and pelvis, initial encounter: Secondary | ICD-10-CM | POA: Diagnosis not present

## 2017-03-19 DIAGNOSIS — Z7984 Long term (current) use of oral hypoglycemic drugs: Secondary | ICD-10-CM | POA: Insufficient documentation

## 2017-03-19 DIAGNOSIS — Y939 Activity, unspecified: Secondary | ICD-10-CM | POA: Insufficient documentation

## 2017-03-19 DIAGNOSIS — R059 Cough, unspecified: Secondary | ICD-10-CM

## 2017-03-19 DIAGNOSIS — Z794 Long term (current) use of insulin: Secondary | ICD-10-CM | POA: Insufficient documentation

## 2017-03-19 DIAGNOSIS — F17200 Nicotine dependence, unspecified, uncomplicated: Secondary | ICD-10-CM | POA: Diagnosis not present

## 2017-03-19 DIAGNOSIS — E119 Type 2 diabetes mellitus without complications: Secondary | ICD-10-CM | POA: Insufficient documentation

## 2017-03-19 DIAGNOSIS — J449 Chronic obstructive pulmonary disease, unspecified: Secondary | ICD-10-CM | POA: Diagnosis not present

## 2017-03-19 DIAGNOSIS — I110 Hypertensive heart disease with heart failure: Secondary | ICD-10-CM | POA: Diagnosis not present

## 2017-03-19 DIAGNOSIS — Y999 Unspecified external cause status: Secondary | ICD-10-CM | POA: Diagnosis not present

## 2017-03-19 DIAGNOSIS — I10 Essential (primary) hypertension: Secondary | ICD-10-CM | POA: Diagnosis not present

## 2017-03-19 DIAGNOSIS — S20469A Insect bite (nonvenomous) of unspecified back wall of thorax, initial encounter: Secondary | ICD-10-CM | POA: Diagnosis not present

## 2017-03-19 DIAGNOSIS — I5022 Chronic systolic (congestive) heart failure: Secondary | ICD-10-CM | POA: Insufficient documentation

## 2017-03-19 DIAGNOSIS — Z79899 Other long term (current) drug therapy: Secondary | ICD-10-CM | POA: Diagnosis not present

## 2017-03-19 DIAGNOSIS — Z7982 Long term (current) use of aspirin: Secondary | ICD-10-CM | POA: Insufficient documentation

## 2017-03-19 DIAGNOSIS — R05 Cough: Secondary | ICD-10-CM | POA: Diagnosis not present

## 2017-03-19 DIAGNOSIS — Y929 Unspecified place or not applicable: Secondary | ICD-10-CM | POA: Insufficient documentation

## 2017-03-19 MED ORDER — LIDOCAINE HCL (PF) 1 % IJ SOLN
INTRAMUSCULAR | Status: AC
  Administered 2017-03-19: 5 mL
  Filled 2017-03-19: qty 5

## 2017-03-19 MED ORDER — LIDOCAINE HCL (PF) 1 % IJ SOLN
5.0000 mL | Freq: Once | INTRAMUSCULAR | Status: AC
  Administered 2017-03-19: 5 mL

## 2017-03-19 MED ORDER — BENZONATATE 100 MG PO CAPS: ORAL_CAPSULE | ORAL | 0 refills | Status: DC

## 2017-03-19 MED ORDER — DOXYCYCLINE HYCLATE 100 MG PO CAPS: 100.0000 mg | ORAL_CAPSULE | Freq: Two times a day (BID) | ORAL | 0 refills | Status: DC

## 2017-03-19 NOTE — ED Notes (Signed)

## 2017-03-19 NOTE — ED Triage Notes (Signed)
Pt reports developed mid lower back pain reports he was coughing and pain started denies any other symptoms

## 2017-03-19 NOTE — ED Provider Notes (Signed)
[facility] Emergency Department Provider Note  ____________________________________________   First MD Initiated Contact with Patient 03/19/17 1108     (approximate)  I have reviewed the triage vital signs and the nursing notes.   HISTORY  Chief Complaint Back Pain and Cough   HPI Jorge Cruz is a 65 y.o. male is here with complaint of cough for 1 month. Patient is unaware of any fever or chills. Patient is a one pack a day smoker for many years. Patient denies any previous respiratory issues. He also is concerned that he had a tick bite approximately 3 weeks ago. He states that his wife removed the tick but they are uncertain as to whether the entire tick was removed. He has a place on his lower back that has not healed completely that he also wants to have checked. He rates his pain as an 8 out of 10.   Past Medical History:  Diagnosis Date  . Chronic systolic CHF (congestive heart failure) (HCC)    a. 03/2014 Echo: EF 45%, glob HK; b. 03/2016 Echo: EF 20-25%, diff HK, Gr1 DD, mild MR.  Marland Kitchen COPD (chronic obstructive pulmonary disease) (HCC)   . Diabetes mellitus without complication (HCC)   . Hypertensive heart disease   . NICM (nonischemic cardiomyopathy) (HCC)    a. 03/2014 Echo: EF 45%, glob HK, mild conc LVH; b. 03/2014 MV: possible mild ischemia, superimposed on small inf infarct, EF 36%; c. 03/2016 MV: EF <30%, no ischemia; d. 03/2016 Echo: EF 20-25%, diff HK, Gr1 DD, mild MR.  . Tobacco abuse     Patient Active Problem List   Diagnosis Date Noted  . COPD exacerbation (HCC) 04/01/2016  . Essential hypertension 04/01/2016  . HLD (hyperlipidemia) 04/01/2016  . Atypical chest pain 03/31/2016  . Elevated troponin 03/31/2016  . Diabetes (HCC) 03/31/2016    History reviewed. No pertinent surgical history.  Prior to Admission medications   Medication Sig Start Date End Date Taking? Authorizing Provider  aspirin EC 81 MG tablet Take 1 tablet (81 mg  total) by mouth daily. 04/01/16   Enedina Finner, MD  atorvastatin (LIPITOR) 40 MG tablet Take 1 tablet (40 mg total) by mouth daily. 05/03/16   End, [name], MD  benzonatate (TESSALON PERLES) 100 MG capsule 1-2 every 8 hours prn cough 03/19/17   Bridget Hartshorn L, PA-C  carvedilol (COREG) 12.5 MG tablet Take 1 tablet (12.5 mg total) by mouth 2 (two) times daily with a meal. 05/03/16   End, [name], MD  doxycycline (VIBRAMYCIN) 100 MG capsule Take 1 capsule (100 mg total) by mouth 2 (two) times daily. 03/19/17   Tommi Rumps, PA-C  furosemide (LASIX) 20 MG tablet Take 1 tablet (20 mg total) by mouth daily. 05/03/16 08/01/16  End, [name], MD  glipiZIDE (GLUCOTROL XL) 10 MG 24 hr tablet Take 10 mg by mouth daily.    [provider]  insulin detemir (LEVEMIR) 100 UNIT/ML injection Inject 34 Units into the skin at bedtime.    [provider]  lisinopril (PRINIVIL,ZESTRIL) 20 MG tablet Take 20 mg by mouth daily.    [provider]  metFORMIN (GLUCOPHAGE) 1000 MG tablet Take 1 tablet by mouth 2 (two) times daily.    [provider]    Allergies Patient has no known allergies.  Family History  Problem Relation Age of Onset  . Diabetes Mother   . Diabetes Father     Social History Social History  Substance Use Topics  . Smoking  status: Current Every Day Smoker    Packs/day: 1.00    Years: 50.00  . Smokeless tobacco: Never Used  . Alcohol use No    Review of Systems Constitutional: No fever/chills ENT: No sore throat.Negative for nasal congestion. Cardiovascular: Denies chest pain. Respiratory: Denies shortness of breath. Positive for occasional productive cough. Gastrointestinal: No abdominal pain.  No nausea, no vomiting.   Musculoskeletal: Negative for back pain. Skin: Questionable lesion on back. Neurological: Negative for headaches, focal weakness or numbness.   ____________________________________________   PHYSICAL EXAM:  VITAL  SIGNS: ED Triage Vitals  Enc Vitals Group     BP 03/19/17 1051 125/62     Pulse Rate 03/19/17 1051 81     Resp 03/19/17 1051 20     Temp 03/19/17 1051 98.6 F (37 C)     Temp Source 03/19/17 1051 Oral     SpO2 03/19/17 1051 100 %     Weight 03/19/17 1052 222 lb (100.7 kg)     Height 03/19/17 1052 5\' 7"  (1.702 m)     Head Circumference --      Peak Flow --      Pain Score 03/19/17 1050 8     Pain Loc --      Pain Edu? --      Excl. in GC? --    Constitutional: Alert and oriented. Well appearing and in no acute distress. Eyes: Conjunctivae are normal. PERRL. EOMI. Head: Atraumatic. Nose: No congestion/rhinnorhea. Mouth/Throat: Mucous membranes are moist.  Oropharynx non-erythematous. Neck: No stridor.   Hematological/Lymphatic/Immunilogical: No cervical lymphadenopathy. Cardiovascular: Normal rate, regular rhythm. Grossly normal heart sounds.  Good peripheral circulation. Respiratory: Normal respiratory effort.  No retractions. Lungs CTAB. Musculoskeletal: Moves upper and lower extremities without any difficulty. Normal gait was noted. Neurologic:  Normal speech and language. No gross focal neurologic deficits are appreciated.  Skin:  Skin is warm, dry and intact. Lower back just above the buttocks there is a 0.5 cm hard lesion without erythema or drainage. Skin is intact. Questionable foreign body versus tick particles. Psychiatric: Mood and affect are normal. Speech and behavior are normal.  ____________________________________________   LABS (all labs ordered are listed, but only abnormal results are displayed)  Labs Reviewed - No data to display  RADIOLOGY  Dg Chest 2 View  Result Date: 03/19/2017 CLINICAL DATA:  Smoker with cough for 1 month. Left posterior chest wall irregular sensation. EXAM: CHEST  2 VIEW COMPARISON:  03/31/2016 radiographs. FINDINGS: The heart size and mediastinal contours are normal. The lungs are clear. There is no pleural effusion or  pneumothorax. No acute osseous findings are identified. IMPRESSION: Stable chest.  No active cardiopulmonary process. Electronically Signed   By: Carey Bullocks M.D.   On: 03/19/2017 11:41    ____________________________________________   PROCEDURES  Procedure(s) performed:  INCISION AND DRAINAGE Performed by: Tommi Rumps Consent: Verbal consent obtained. Risks and benefits: risks, benefits and alternatives were discussed Type: ? Foreign body   Body area: Lower back/buttocks  Anesthesia: local infiltration  Incision was made with an 18-gauge needle  Local anesthetic: lidocaine 1 % without epinephrine  Anesthetic total: 2 ml  Complexity: Simple Drainage: purulent  Drainage amount: Minimal   Patient tolerance: Patient tolerated the procedure well with no immediate complications.    Procedures  Critical Care performed: No  ____________________________________________   INITIAL IMPRESSION / ASSESSMENT AND PLAN / ED COURSE  Pertinent labs & imaging results that were available during my care of the patient were reviewed  by me and considered in my medical decision making (see chart for details).  Given patient's visit there was very few times that a cough was noticeable. Patient and wife were given results of his chest x-ray. It is questionable at this time if his lisinopril as well as causing his cough. Patient agrees to make an appointment with his PCP for a recheck. He was given a prescription for Tessalon Perles 1 or 2 every 8 hours if needed for cough. Also with his history of tick bite 3 weeks ago and questionable foreign body that was removed from the bite area with purulent drainage patient was placed on doxycycline twice a day for 7 days.   ____________________________________________   FINAL CLINICAL IMPRESSION(S) / ED DIAGNOSES  Final diagnoses:  Cough  Tick bite of back, initial encounter      NEW MEDICATIONS STARTED DURING THIS  VISIT:  Discharge Medication List as of 03/19/2017 12:17 PM    START taking these medications   Details  benzonatate (TESSALON PERLES) 100 MG capsule 1-2 every 8 hours prn cough, Print    doxycycline (VIBRAMYCIN) 100 MG capsule Take 1 capsule (100 mg total) by mouth 2 (two) times daily., Starting Sun 03/19/2017, Print         Note:  This document was prepared using Dragon voice recognition software and may include unintentional dictation errors.    Tommi Rumps, PA-C 03/19/17 1410    Jeanmarie Plant, MD 03/19/17 1438

## 2017-03-19 NOTE — Discharge Instructions (Signed)
Make an appointment with your primary care provider for next week for reevaluation of your cough. Begin taking doxycycline 1 twice a day for 7 days due to tick bite. Tessalon Perles 1 or 2 every 8 hours as needed for cough. Keep in mind that your blood pressure medication could be the source of your cough and if not improving your doctor will need to discontinue this medication.

## 2017-05-30 ENCOUNTER — Encounter: Payer: Self-pay | Admitting: Emergency Medicine

## 2017-05-30 ENCOUNTER — Inpatient Hospital Stay
Admission: EM | Admit: 2017-05-30 | Discharge: 2017-06-01 | DRG: 286 | Disposition: A | Discharge status: Home / Self Care | Payer: Medicare Other | Attending: Internal Medicine | Admitting: Internal Medicine

## 2017-05-30 ENCOUNTER — Emergency Department: Payer: Medicare Other

## 2017-05-30 DIAGNOSIS — E785 Hyperlipidemia, unspecified: Secondary | ICD-10-CM | POA: Diagnosis present

## 2017-05-30 DIAGNOSIS — Z7984 Long term (current) use of oral hypoglycemic drugs: Secondary | ICD-10-CM

## 2017-05-30 DIAGNOSIS — I509 Heart failure, unspecified: Secondary | ICD-10-CM | POA: Diagnosis not present

## 2017-05-30 DIAGNOSIS — R079 Chest pain, unspecified: Secondary | ICD-10-CM

## 2017-05-30 DIAGNOSIS — F17200 Nicotine dependence, unspecified, uncomplicated: Secondary | ICD-10-CM | POA: Diagnosis not present

## 2017-05-30 DIAGNOSIS — Z9119 Patient's noncompliance with other medical treatment and regimen: Secondary | ICD-10-CM

## 2017-05-30 DIAGNOSIS — Z79899 Other long term (current) drug therapy: Secondary | ICD-10-CM

## 2017-05-30 DIAGNOSIS — I214 Non-ST elevation (NSTEMI) myocardial infarction: Secondary | ICD-10-CM | POA: Diagnosis present

## 2017-05-30 DIAGNOSIS — I248 Other forms of acute ischemic heart disease: Secondary | ICD-10-CM | POA: Diagnosis not present

## 2017-05-30 DIAGNOSIS — J449 Chronic obstructive pulmonary disease, unspecified: Secondary | ICD-10-CM | POA: Diagnosis present

## 2017-05-30 DIAGNOSIS — I5023 Acute on chronic systolic (congestive) heart failure: Secondary | ICD-10-CM | POA: Diagnosis not present

## 2017-05-30 DIAGNOSIS — Z833 Family history of diabetes mellitus: Secondary | ICD-10-CM

## 2017-05-30 DIAGNOSIS — I2510 Atherosclerotic heart disease of native coronary artery without angina pectoris: Secondary | ICD-10-CM | POA: Diagnosis present

## 2017-05-30 DIAGNOSIS — F17210 Nicotine dependence, cigarettes, uncomplicated: Secondary | ICD-10-CM | POA: Diagnosis not present

## 2017-05-30 DIAGNOSIS — R0789 Other chest pain: Secondary | ICD-10-CM | POA: Diagnosis not present

## 2017-05-30 DIAGNOSIS — Z7982 Long term (current) use of aspirin: Secondary | ICD-10-CM

## 2017-05-30 DIAGNOSIS — I428 Other cardiomyopathies: Secondary | ICD-10-CM | POA: Diagnosis present

## 2017-05-30 DIAGNOSIS — I110 Hypertensive heart disease with heart failure: Secondary | ICD-10-CM | POA: Diagnosis not present

## 2017-05-30 DIAGNOSIS — E119 Type 2 diabetes mellitus without complications: Secondary | ICD-10-CM | POA: Diagnosis not present

## 2017-05-30 HISTORY — DX: Other psychoactive substance abuse, uncomplicated: F19.10

## 2017-05-30 LAB — BASIC METABOLIC PANEL
Anion gap: 10 (ref 5–15)
BUN: 20 mg/dL (ref 6–20)
CO2: 26 mmol/L (ref 22–32)
Calcium: 9.2 mg/dL (ref 8.9–10.3)
Chloride: 104 mmol/L (ref 101–111)
Creatinine, Ser: 1.24 mg/dL (ref 0.61–1.24)
GFR calc Af Amer: >60 mL/min (ref >60)
GFR calc non Af Amer: 59 mL/min — ABNORMAL LOW (ref >60)
Glucose, Bld: 171 mg/dL — ABNORMAL HIGH (ref 65–99)
Potassium: 4 mmol/L (ref 3.5–5.1)
Sodium: 140 mmol/L (ref 135–145)

## 2017-05-30 LAB — CBC
HCT: 39 % — ABNORMAL LOW (ref 40.0–52.0)
Hemoglobin: 12.7 g/dL — ABNORMAL LOW (ref 13.0–18.0)
MCH: 31.3 pg (ref 26.0–34.0)
MCHC: 32.7 g/dL (ref 32.0–36.0)
MCV: 95.8 fL (ref 80.0–100.0)
Platelets: 216 10*3/uL (ref 150–440)
RBC: 4.07 MIL/uL — ABNORMAL LOW (ref 4.40–5.90)
RDW: 15.8 % — ABNORMAL HIGH (ref 11.5–14.5)
WBC: 4.6 10*3/uL (ref 3.8–10.6)

## 2017-05-30 LAB — TROPONIN I
Troponin I: 0.08 ng/mL (ref <0.03)
Troponin I: 0.1 ng/mL (ref <0.03)

## 2017-05-30 LAB — BRAIN NATRIURETIC PEPTIDE: B Natriuretic Peptide: 150 pg/mL — ABNORMAL HIGH (ref 0.0–100.0)

## 2017-05-30 MED ORDER — FUROSEMIDE 10 MG/ML IJ SOLN
40.0000 mg | Freq: Once | INTRAMUSCULAR | Status: AC
  Administered 2017-05-30: 40 mg via INTRAVENOUS
  Filled 2017-05-30: qty 4

## 2017-05-30 NOTE — ED Triage Notes (Signed)
Pt c/o cough and SOB x3 weeks. Pt reports he has been to PCP and referred to cardiologist. Pt reports a continuous cough since last night and has had an increase of SOB. Pt smokes tobacco and has HX of COPD. Pt denies use of O2 at home.

## 2017-05-30 NOTE — ED Notes (Signed)
Pt states he couldn't breathe last night and it got worse today so he came in. Was in hospital a couple weeks ago for the same thing. Was diagnosed with  CHF and COPD during that visit. Family at bedside

## 2017-05-30 NOTE — ED Provider Notes (Signed)
[facility] Emergency Department Provider Note  ____________________________________________   First MD Initiated Contact with Patient 05/30/17 2110     (approximate)  I have reviewed the triage vital signs and the nursing notes.   HISTORY  Chief Complaint Shortness of Breath   HPI Jorge Cruz is a 66 y.o. male with a history of CHF with a recent admission several weeks ago at [facility] for CHF and an EF of 25% was presenting to the emergency department with 2 days of shortness of breath. He reports compliance with his 20 mg of Lasix daily. Says that he has had a cough which is nonproductive. Complains of a chest tightness after coughing which quickly dissipates. Says that the chest tightness is to the center of his chest. Says that he does not have any chest pain with exertion but he does have worsening shortness of breath with exertion. Cardiology [facility].   Past Medical History:  Diagnosis Date  . Chronic systolic CHF (congestive heart failure) (HCC)    a. 03/2014 Echo: EF 45%, glob HK; b. 03/2016 Echo: EF 20-25%, diff HK, Gr1 DD, mild MR.  Marland Kitchen COPD (chronic obstructive pulmonary disease) (HCC)   . Diabetes mellitus without complication (HCC)   . Hypertensive heart disease   . NICM (nonischemic cardiomyopathy) (HCC)    a. 03/2014 Echo: EF 45%, glob HK, mild conc LVH; b. 03/2014 MV: possible mild ischemia, superimposed on small inf infarct, EF 36%; c. 03/2016 MV: EF <30%, no ischemia; d. 03/2016 Echo: EF 20-25%, diff HK, Gr1 DD, mild MR.  . Tobacco abuse     Patient Active Problem List   Diagnosis Date Noted  . COPD exacerbation (HCC) 04/01/2016  . Essential hypertension 04/01/2016  . HLD (hyperlipidemia) 04/01/2016  . Atypical chest pain 03/31/2016  . Elevated troponin 03/31/2016  . Diabetes (HCC) 03/31/2016    History reviewed. No pertinent surgical history.  Prior to Admission medications   Medication Sig Start Date End Date Taking? Authorizing Provider    aspirin EC 81 MG tablet Take 1 tablet (81 mg total) by mouth daily. 04/01/16  Yes Enedina Finner, MD  atorvastatin (LIPITOR) 80 MG tablet Take 80 mg by mouth daily.   Yes [provider]  furosemide (LASIX) 20 MG tablet Take 1 tablet (20 mg total) by mouth daily. 05/03/16 05/30/18 Yes End, [name], MD  insulin detemir (LEVEMIR) 100 UNIT/ML injection Inject 50 Units into the skin at bedtime.    Yes [provider]  metFORMIN (GLUCOPHAGE) 1000 MG tablet Take 1 tablet by mouth 2 (two) times daily.   Yes [provider]  tiotropium (SPIRIVA) 18 MCG inhalation capsule Place 18 mcg into inhaler and inhale daily.   Yes [provider]  atorvastatin (LIPITOR) 40 MG tablet Take 1 tablet (40 mg total) by mouth daily. Patient not taking: Reported on 05/30/2017 05/03/16   End, [name], MD  benzonatate (TESSALON PERLES) 100 MG capsule 1-2 every 8 hours prn cough Patient not taking: Reported on 05/30/2017 03/19/17   Tommi Rumps, PA-C  carvedilol (COREG) 12.5 MG tablet Take 1 tablet (12.5 mg total) by mouth 2 (two) times daily with a meal. Patient not taking: Reported on 05/30/2017 05/03/16   End, [name], MD  doxycycline (VIBRAMYCIN) 100 MG capsule Take 1 capsule (100 mg total) by mouth 2 (two) times daily. Patient not taking: Reported on 05/30/2017 03/19/17   Tommi Rumps, PA-C    Allergies Patient has no known allergies.  Family History  Problem Relation Age  of Onset  . Diabetes Mother   . Diabetes Father     Social History Social History  Substance Use Topics  . Smoking status: Current Every Day Smoker    Packs/day: 1.00    Years: 50.00  . Smokeless tobacco: Never Used  . Alcohol use No    Review of Systems  Constitutional: No fever/chills Eyes: No visual changes. ENT: No sore throat. Cardiovascular: as above Respiratory: as above Gastrointestinal: No abdominal pain.  No nausea, no vomiting.  No diarrhea.  No constipation. Genitourinary:  Negative for dysuria. Musculoskeletal: Negative for back pain. Skin: Negative for rash. Neurological: Negative for headaches, focal weakness or numbness.   ____________________________________________   PHYSICAL EXAM:  VITAL SIGNS: ED Triage Vitals  Enc Vitals Group     BP 05/30/17 1946 (!) 149/79     Pulse Rate 05/30/17 1946 95     Resp 05/30/17 1946 18     Temp 05/30/17 1946 98.2 F (36.8 C)     Temp Source 05/30/17 1946 Oral     SpO2 05/30/17 1946 96 %     Weight 05/30/17 1947 222 lb (100.7 kg)     Height --      Head Circumference --      Peak Flow --      Pain Score --      Pain Loc --      Pain Edu? --      Excl. in GC? --     Constitutional: Alert and oriented. Well appearing and in no acute distress. Eyes: Conjunctivae are normal.  Head: Atraumatic. Nose: No congestion/rhinnorhea. Mouth/Throat: Mucous membranes are moist.  Neck: No stridor.   Cardiovascular: Normal rate, regular rhythm. Grossly normal heart sounds.   Respiratory: Normal respiratory effort.  No retractions. Rales to the bilateral bases. Gastrointestinal: Soft and nontender. No distention. No CVA tenderness. Musculoskeletal: No lower extremity tenderness nor edema.  No joint effusions. Neurologic:  Normal speech and language. No gross focal neurologic deficits are appreciated. Skin:  Skin is warm, dry and intact. No rash noted. Psychiatric: Mood and affect are normal. Speech and behavior are normal.  ____________________________________________   LABS (all labs ordered are listed, but only abnormal results are displayed)  Labs Reviewed  BASIC METABOLIC PANEL - Abnormal; Notable for the following:       Result Value   Glucose, Bld 171 (*)    GFR calc non Af Amer 59 (*)    All other components within normal limits  CBC - Abnormal; Notable for the following:    RBC 4.07 (*)    Hemoglobin 12.7 (*)    HCT 39.0 (*)    RDW 15.8 (*)    All other components within normal limits  TROPONIN I -  Abnormal; Notable for the following:    Troponin I 0.08 (*)    All other components within normal limits  TROPONIN I - Abnormal; Notable for the following:    Troponin I 0.10 (*)    All other components within normal limits  BRAIN NATRIURETIC PEPTIDE - Abnormal; Notable for the following:    B Natriuretic Peptide 150.0 (*)    All other components within normal limits   ____________________________________________  EKG  ED ECG REPORT I, Arelia Longest, the attending physician, personally viewed and interpreted this ECG.   Date: 05/30/2017  EKG Time: 1945  Rate: 98  Rhythm: normal sinus rhythmwith PACs  Axis: normal  Intervals:none  ST&T Change: no ST segment elevation or depression.  No abnormal T-wave inversion.  ____________________________________________  RADIOLOGY  mild CHF ____________________________________________   PROCEDURES  Procedure(s) performed:   Procedures  Critical Care performed:   ____________________________________________   INITIAL IMPRESSION / ASSESSMENT AND PLAN / ED COURSE  Pertinent labs & imaging results that were available during my care of the patient were reviewed by me and considered in my medical decision making (see chart for details).  Differential includes, but is not limited to, viral syndrome, bronchitis including COPD exacerbation, pneumonia, reactive airway disease including asthma, CHF including exacerbation with or without pulmonary/interstitial edema, pneumothorax, ACS, thoracic trauma, and pulmonary embolism.  As part of my medical decision making, I reviewed the following data within the electronic MEDICAL RECORD NUMBER Notes from prior ED visits and recent admission.       ----------------------------------------- 11 PM on 05/30/2017 -----------------------------------------  Plan at this time is to await the patient'ssecond troponin and reassess the patient after Lasix. If the troponin is appropriate and the  patient feels improved then the plan will be to discharge him with 3 days of 40 mg of Lasix and follow up with his cardiologist at [facility]. Sign of Dr. Manson Passey.chest pain does not seem cardiac as it happens F the patient is coughing and then quickly dissipates and there is no exertional worsening of the chest pain.  ____________________________________________   FINAL CLINICAL IMPRESSION(S) / ED DIAGNOSES  chest pain. CHF exacerbation.    NEW MEDICATIONS STARTED DURING THIS VISIT:  New Prescriptions   No medications on file     Note:  This document was prepared using Dragon voice recognition software and may include unintentional dictation errors.     Myrna Blazer, MD 05/31/17 [phone]

## 2017-05-31 DIAGNOSIS — Z7984 Long term (current) use of oral hypoglycemic drugs: Secondary | ICD-10-CM | POA: Diagnosis not present

## 2017-05-31 DIAGNOSIS — E785 Hyperlipidemia, unspecified: Secondary | ICD-10-CM | POA: Diagnosis not present

## 2017-05-31 DIAGNOSIS — I2510 Atherosclerotic heart disease of native coronary artery without angina pectoris: Secondary | ICD-10-CM | POA: Diagnosis present

## 2017-05-31 DIAGNOSIS — J449 Chronic obstructive pulmonary disease, unspecified: Secondary | ICD-10-CM | POA: Diagnosis present

## 2017-05-31 DIAGNOSIS — F17210 Nicotine dependence, cigarettes, uncomplicated: Secondary | ICD-10-CM | POA: Diagnosis present

## 2017-05-31 DIAGNOSIS — Z7982 Long term (current) use of aspirin: Secondary | ICD-10-CM | POA: Diagnosis not present

## 2017-05-31 DIAGNOSIS — R748 Abnormal levels of other serum enzymes: Secondary | ICD-10-CM

## 2017-05-31 DIAGNOSIS — I110 Hypertensive heart disease with heart failure: Secondary | ICD-10-CM | POA: Diagnosis present

## 2017-05-31 DIAGNOSIS — E119 Type 2 diabetes mellitus without complications: Secondary | ICD-10-CM | POA: Diagnosis not present

## 2017-05-31 DIAGNOSIS — I214 Non-ST elevation (NSTEMI) myocardial infarction: Secondary | ICD-10-CM

## 2017-05-31 DIAGNOSIS — Z79899 Other long term (current) drug therapy: Secondary | ICD-10-CM | POA: Diagnosis not present

## 2017-05-31 DIAGNOSIS — Z833 Family history of diabetes mellitus: Secondary | ICD-10-CM | POA: Diagnosis not present

## 2017-05-31 DIAGNOSIS — I5023 Acute on chronic systolic (congestive) heart failure: Secondary | ICD-10-CM

## 2017-05-31 DIAGNOSIS — I248 Other forms of acute ischemic heart disease: Secondary | ICD-10-CM | POA: Diagnosis present

## 2017-05-31 DIAGNOSIS — I428 Other cardiomyopathies: Secondary | ICD-10-CM | POA: Diagnosis present

## 2017-05-31 DIAGNOSIS — Z9119 Patient's noncompliance with other medical treatment and regimen: Secondary | ICD-10-CM | POA: Diagnosis not present

## 2017-05-31 HISTORY — DX: Non-ST elevation (NSTEMI) myocardial infarction: I21.4

## 2017-05-31 LAB — GLUCOSE, CAPILLARY
Glucose-Capillary: 124 mg/dL — ABNORMAL HIGH (ref 65–99)
Glucose-Capillary: 197 mg/dL — ABNORMAL HIGH (ref 65–99)
Glucose-Capillary: 110 mg/dL — ABNORMAL HIGH (ref 65–99)
Glucose-Capillary: 175 mg/dL — ABNORMAL HIGH (ref 65–99)
Glucose-Capillary: 151 mg/dL — ABNORMAL HIGH (ref 65–99)

## 2017-05-31 LAB — TSH: TSH: 0.722 u[IU]/mL (ref 0.350–4.500)

## 2017-05-31 LAB — TROPONIN I
Troponin I: 0.1 ng/mL (ref <0.03)
Troponin I: 0.1 ng/mL (ref <0.03)
Troponin I: 0.1 ng/mL (ref <0.03)

## 2017-05-31 LAB — HEMOGLOBIN A1C
Hgb A1c MFr Bld: 7.6 % — ABNORMAL HIGH (ref 4.8–5.6)
Mean Plasma Glucose: 171.42 mg/dL

## 2017-05-31 MED ORDER — DOCUSATE SODIUM 100 MG PO CAPS
100.0000 mg | ORAL_CAPSULE | Freq: Two times a day (BID) | ORAL | Status: DC
  Administered 2017-05-31 (×2): 100 mg via ORAL
  Filled 2017-05-31 (×3): qty 1

## 2017-05-31 MED ORDER — FUROSEMIDE 10 MG/ML IJ SOLN
40.0000 mg | Freq: Two times a day (BID) | INTRAMUSCULAR | Status: DC
  Administered 2017-05-31 – 2017-06-01 (×3): 40 mg via INTRAVENOUS
  Filled 2017-05-31 (×3): qty 4

## 2017-05-31 MED ORDER — ACETAMINOPHEN 650 MG RE SUPP: 650.0000 mg | Freq: Four times a day (QID) | RECTAL | Status: DC | PRN

## 2017-05-31 MED ORDER — ENOXAPARIN SODIUM 40 MG/0.4ML ~~LOC~~ SOLN
40.0000 mg | SUBCUTANEOUS | Status: DC
  Administered 2017-06-01: 40 mg via SUBCUTANEOUS
  Filled 2017-05-31: qty 0.4

## 2017-05-31 MED ORDER — GUAIFENESIN-DM 100-10 MG/5ML PO SYRP
5.0000 mL | ORAL_SOLUTION | ORAL | Status: DC | PRN
  Administered 2017-05-31 (×2): 5 mL via ORAL
  Filled 2017-05-31 (×2): qty 5

## 2017-05-31 MED ORDER — FUROSEMIDE 20 MG PO TABS: 20.0000 mg | ORAL_TABLET | Freq: Every day | ORAL | Status: DC

## 2017-05-31 MED ORDER — POTASSIUM CHLORIDE CRYS ER 20 MEQ PO TBCR: 40.0000 meq | EXTENDED_RELEASE_TABLET | ORAL | Status: DC

## 2017-05-31 MED ORDER — CARVEDILOL 3.125 MG PO TABS
3.1250 mg | ORAL_TABLET | Freq: Two times a day (BID) | ORAL | Status: DC
  Administered 2017-05-31 – 2017-06-01 (×3): 3.125 mg via ORAL
  Filled 2017-05-31 (×3): qty 1

## 2017-05-31 MED ORDER — NITROGLYCERIN 0.4 MG SL SUBL: 0.4000 mg | SUBLINGUAL_TABLET | SUBLINGUAL | Status: DC | PRN

## 2017-05-31 MED ORDER — ACETAMINOPHEN 325 MG PO TABS: 650.0000 mg | ORAL_TABLET | Freq: Four times a day (QID) | ORAL | Status: DC | PRN

## 2017-05-31 MED ORDER — TIOTROPIUM BROMIDE MONOHYDRATE 18 MCG IN CAPS
18.0000 ug | ORAL_CAPSULE | Freq: Every day | RESPIRATORY_TRACT | Status: DC
  Administered 2017-05-31 – 2017-06-01 (×2): 18 ug via RESPIRATORY_TRACT
  Filled 2017-05-31: qty 5

## 2017-05-31 MED ORDER — INSULIN ASPART 100 UNIT/ML ~~LOC~~ SOLN
0.0000 [IU] | Freq: Three times a day (TID) | SUBCUTANEOUS | Status: DC
  Administered 2017-05-31: 3 [IU] via SUBCUTANEOUS
  Administered 2017-05-31 – 2017-06-01 (×2): 2 [IU] via SUBCUTANEOUS
  Administered 2017-06-01: 11 [IU] via SUBCUTANEOUS
  Filled 2017-05-31 (×4): qty 1

## 2017-05-31 MED ORDER — SODIUM CHLORIDE 0.9 % IV SOLN: INTRAVENOUS | Status: DC

## 2017-05-31 MED ORDER — LOSARTAN POTASSIUM 25 MG PO TABS
25.0000 mg | ORAL_TABLET | Freq: Every day | ORAL | Status: DC
  Administered 2017-05-31 – 2017-06-01 (×2): 25 mg via ORAL
  Filled 2017-05-31 (×2): qty 1

## 2017-05-31 MED ORDER — INSULIN DETEMIR 100 UNIT/ML ~~LOC~~ SOLN
30.0000 [IU] | Freq: Every day | SUBCUTANEOUS | Status: DC
  Administered 2017-05-31: 30 [IU] via SUBCUTANEOUS
  Filled 2017-05-31 (×2): qty 0.3

## 2017-05-31 MED ORDER — ONDANSETRON HCL 4 MG/2ML IJ SOLN: 4.0000 mg | Freq: Four times a day (QID) | INTRAMUSCULAR | Status: DC | PRN

## 2017-05-31 MED ORDER — ASPIRIN EC 81 MG PO TBEC
81.0000 mg | DELAYED_RELEASE_TABLET | Freq: Every day | ORAL | Status: DC
  Administered 2017-05-31 – 2017-06-01 (×2): 81 mg via ORAL
  Filled 2017-05-31 (×3): qty 1

## 2017-05-31 MED ORDER — ONDANSETRON HCL 4 MG PO TABS: 4.0000 mg | ORAL_TABLET | Freq: Four times a day (QID) | ORAL | Status: DC | PRN

## 2017-05-31 MED ORDER — PNEUMOCOCCAL VAC POLYVALENT 25 MCG/0.5ML IJ INJ
0.5000 mL | INJECTION | INTRAMUSCULAR | Status: DC
  Filled 2017-05-31: qty 0.5

## 2017-05-31 MED ORDER — MORPHINE SULFATE (PF) 2 MG/ML IV SOLN
2.0000 mg | INTRAVENOUS | Status: DC | PRN
  Administered 2017-05-31: 2 mg via INTRAVENOUS
  Filled 2017-05-31: qty 1

## 2017-05-31 MED ORDER — ATORVASTATIN CALCIUM 20 MG PO TABS
80.0000 mg | ORAL_TABLET | Freq: Every day | ORAL | Status: DC
  Administered 2017-05-31: 80 mg via ORAL
  Filled 2017-05-31: qty 4

## 2017-05-31 MED ORDER — CYCLOBENZAPRINE HCL 10 MG PO TABS
5.0000 mg | ORAL_TABLET | Freq: Three times a day (TID) | ORAL | Status: DC | PRN
  Administered 2017-05-31 (×2): 5 mg via ORAL
  Filled 2017-05-31 (×2): qty 1

## 2017-05-31 NOTE — Care Management (Signed)
Recent discharge from [facility] for copd.  Patient to undergo cardiac cath today. Chronic systolic heart failure. Documentation of noncompliance.  Recent dx within the past year of non ischemic cardiomyopathy with EF 20%.  Patient is currently awaiting cardiac cath.  He says that he does not miss appointments due to issues with transportation.  He is agreeable to Heart Failure Clinic referral.  he does not have access to scales and does not have the financial resources to purchase.  He has the Living with Heart Failure Education Booklet.  Placed dietician referral.  Provided patient with set of scales.

## 2017-05-31 NOTE — Consult Note (Signed)
Cardiology Consult    Patient ID: Jorge Cruz MRN: [id number], DOB/AGE: [birth year]   Admit date: 05/30/2017 Date of Consult: 05/31/2017  Primary Physician: [facility] Primary Cardiologist: Wallace Cullens, MD  Requesting Provider: Ozella Rocks, MD  Patient Profile    Jorge Cruz is a 66 y.o. male with a history of NICM, HFrEF, COPD, DM, HTN, polysubstance abuse, and noncompliance, who is being seen today for the evaluation of dyspnea and CHF at the request of Dr. Elisabeth Pigeon.  Past Medical History   Past Medical History:  Diagnosis Date  . Chronic systolic CHF (congestive heart failure) (HCC)    a. 03/2014 Echo: EF 45%, glob HK; b. 03/2016 Echo: EF 20-25%, diff HK, Gr1 DD, mild MR.  Marland Kitchen COPD (chronic obstructive pulmonary disease) (HCC)   . Diabetes mellitus without complication (HCC)   . Hypertensive heart disease   . NICM (nonischemic cardiomyopathy) (HCC)    a. 03/2014 Echo: EF 45%, glob HK, mild conc LVH; b. 03/2014 MV: possible mild ischemia, superimposed on small inf infarct, EF 36%; c. 03/2016 MV: EF <30%, no ischemia; d. 03/2016 Echo: EF 20-25%, diff HK, Gr1 DD, mild MR.  . Polysubstance abuse (HCC)   . Tobacco abuse     History reviewed. No pertinent surgical history.   Allergies  No Known Allergies  History of Present Illness     66 y.o. male with a history of NICM, HFrEF, COPD, DM, HTN, polysubstance abuse, and noncompliance.  His cardiac hx dates back to 2015, when he was first noted to have LV dysfxn in the setting of CHF.  Stress testing @ that time showed a small inf infarct w/ ? Mild ischemia.  He was medically managed.  CM was felt to be 2/2 crack cocaine abuse.  Over the years, he has had intermittent compliance.  He hosp @ [facility] in 03/2016 w/ chest pain and elevated Troponin. Echocardiogram showed persistent LV dysfunction with an EF of 20-25% and diffuse hypokinesis. Stress testing was undertaken and was nonischemic. He followed up in clinic Following that  hospitalization and was advised to undergo diagnostic catheterization but he deferred. He has not followed up since then. Recently, he has been hospitalized at [facility] in the setting of dyspnea and respiratory failure. This was felt to be multifactorial in the setting of COPD exacerbation as well as heart failure. Discharge summary notes that there was not any significant response to IV Lasix and that COPD exacerbation was the more prevailing diagnosis. He also had mild troponin elevation during that hospitalization. He was discharged home on carvedilol 12.5 g twice a day, Lasix 20 mg daily, Lipitor 80 mg daily with recommendation for outpatient cardiology follow-up and initiation of losartan.  Patient says that following discharge, he continued to have cough and dyspnea on exertion. He does not weigh himself at home. He has not noticed any increase in lower extremity edema, abdominal bloating, PND, orthopnea, early satiety, or chest pain. Due to dyspnea and cough however, he presented to [facility] on October 9. Chest x-ray showed CHF. ECG nonacute. Troponin mildly elevated but it's noted that this is been elevated ever since 2016. Patient denies chest pain. He was admitted for further diuresis. He is currently comfortable.  Inpatient Medications    . aspirin EC  81 mg Oral Daily  . atorvastatin  80 mg Oral Daily  . docusate sodium  100 mg Oral BID  . enoxaparin (LOVENOX) injection  40 mg Subcutaneous Q24H  . furosemide  40 mg  Intravenous BID  . insulin aspart  0-15 Units Subcutaneous TID WC  . insulin detemir  30 Units Subcutaneous QHS  . losartan  25 mg Oral Daily  . [START ON 06/01/2017] pneumococcal 23 valent vaccine  0.5 mL Intramuscular Tomorrow-1000  . tiotropium  18 mcg Inhalation Daily    Family History    Family History  Problem Relation Age of Onset  . Diabetes Mother   . Diabetes Father     Social History    Social History   Social History  . Marital status: Married     Spouse name: N/A  . Number of children: N/A  . Years of education: N/A   Occupational History  . Not on file.   Social History Main Topics  . Smoking status: Current Every Day Smoker    Packs/day: 1.00    Years: 50.00  . Smokeless tobacco: Never Used  . Alcohol use No  . Drug use: No  . Sexual activity: Not on file   Other Topics Concern  . Not on file   Social History Narrative  . No narrative on file     Review of Systems    General:  No chills, fever, night sweats or weight changes.  Cardiovascular:  No chest pain, +++ dyspnea on exertion, no edema, orthopnea, palpitations, paroxysmal nocturnal dyspnea. Dermatological: No rash, lesions/masses Respiratory: +++ cough, dyspnea Urologic: No hematuria, dysuria Abdominal:   No nausea, vomiting, diarrhea, bright red blood per rectum, melena, or hematemesis Neurologic:  No visual changes, wkns, changes in mental status. All other systems reviewed and are otherwise negative except as noted above.  Physical Exam    Blood pressure 131/70, pulse 83, temperature 98.5 F (36.9 C), temperature source Oral, resp. rate 17, height 5\' 8"  (1.727 m), weight 213 lb 6.4 oz (96.8 kg), SpO2 93 %.  General: Pleasant, NAD Psych: Normal affect. Neuro: Alert and oriented X 3. Moves all extremities spontaneously. HEENT: Normal  Neck: Supple without bruits. JVP. Approximately 12-14 cm. Lungs:  Resp regular and unlabored, bibasilar crackles. Heart: RRR no s3, s4, or murmurs. Abdomen: Soft, non-tender, non-distended, BS + x 4.  Extremities: No clubbing, cyanosis or edema. DP/PT/Radials 2+ and equal bilaterally.  Labs     Recent Labs  05/30/17 1943 05/30/17 2301 05/31/17 0539  TROPONINI 0.08* 0.10* 0.10*   Lab Results  Component Value Date   WBC 4.6 05/30/2017   HGB 12.7 (L) 05/30/2017   HCT 39.0 (L) 05/30/2017   MCV 95.8 05/30/2017   PLT 216 05/30/2017     Recent Labs Lab 05/30/17 1943  NA 140  K 4.0  CL 104  CO2 26  BUN  20  CREATININE 1.24  CALCIUM 9.2  GLUCOSE 171*   Lab Results  Component Value Date   CHOL 151 04/01/2016   HDL 46 04/01/2016   LDLCALC 99 04/01/2016   TRIG 31 04/01/2016     Radiology Studies    Dg Chest 2 View  Result Date: 05/30/2017 CLINICAL DATA:  66 year old male with history of shortness of breath. EXAM: CHEST  2 VIEW COMPARISON:  Chest x-ray 03/19/2017. FINDINGS: There is cephalization of the pulmonary vasculature and slight indistinctness of the interstitial markings suggestive of mild pulmonary edema. Trace bilateral pleural effusions. No pneumothorax. No suspicious appearing pulmonary nodules or masses. Mild cardiomegaly. Upper mediastinal contours are within normal limits. IMPRESSION: 1. The appearance of the chest suggests mild congestive heart failure, as above. Electronically Signed   By: Brayton Mars.D.  On: 05/30/2017 20:14    ECG & Cardiac Imaging    Regular sinus rhythm, 98, PACs, left atrial enlargement, question prior anterior infarct.  Assessment & Plan    1. Chronic systolic congestive heart failure/presumed nonischemic cardiomyopathy: EF  20-25% by echo in August 2017 with nonischemic Myoview at that time. Patient was advised to undergo catheterization however deferred. He has had several recent admissions in the setting of dyspnea and cough with evidence for heart failure. Recently admitted to [facility] for COPD exacerbation. Now presents with recurrent dyspnea and volume overload. Diuresis today. We did discuss catheterization given chronic troponin elevation and LV dysfunction and patient is now interested. Provided that renal function is stable tomorrow and patient can lie flat, we will pursue diagnostic catheterization.  The patient understands that risks include but are not limited to stroke (1 in 1000), death (1 in 1000), kidney failure [usually temporary] (1 in 500), bleeding (1 in 200), allergic reaction [possibly serious] (1 in 200), and agrees to proceed.   He was recently discharged home on carvedilol and will resume this. We are also adding low-dose ARB. Consider spironolactone post cath.  2. Hypertension: Adding ARB. Continue beta blocker.  3. Hyperlipidemia: Continue statin.  4. Tobacco abuse: Cessation advised.  5. Diabetes mellitus: Per internal medicine.  6. Noncompliance: He will benefit from outpatient follow-up in the heart failure clinic.  Signed, Nicolasa Ducking, NP 05/31/2017, 11:04 AM  For questions or updates, please contact   Please consult [email or web address] for contact info under Cardiology/STEMI.

## 2017-05-31 NOTE — Progress Notes (Signed)
[employer] [facility] [place] at [facility]   PATIENT NAME: Reeve Ewell    MR#:  [id number]  DATE OF BIRTH:  10/04/1950  SUBJECTIVE:  CHIEF COMPLAINT:   Chief Complaint  Patient presents with  . Shortness of Breath     Came with SOB, have Low EF, found to have CHF. No chest pain.  REVIEW OF SYSTEMS:  CONSTITUTIONAL: No fever, fatigue or weakness.  EYES: No blurred or double vision.  EARS, NOSE, AND THROAT: No tinnitus or ear pain.  RESPIRATORY: No cough, shortness of breath, wheezing or hemoptysis.  CARDIOVASCULAR: No chest pain, orthopnea, edema.  GASTROINTESTINAL: No nausea, vomiting, diarrhea or abdominal pain.  GENITOURINARY: No dysuria, hematuria.  ENDOCRINE: No polyuria, nocturia,  HEMATOLOGY: No anemia, easy bruising or bleeding SKIN: No rash or lesion. MUSCULOSKELETAL: No joint pain or arthritis.   NEUROLOGIC: No tingling, numbness, weakness.  PSYCHIATRY: No anxiety or depression.   ROS  DRUG ALLERGIES:  No Known Allergies  VITALS:  Blood pressure 124/67, pulse 78, temperature 98 F (36.7 C), temperature source Oral, resp. rate 17, height 5\' 8"  (1.727 m), weight 96.8 kg (213 lb 6.4 oz), SpO2 96 %.  PHYSICAL EXAMINATION:  GENERAL:  66 y.o.-year-old patient lying in the bed with no acute distress.  EYES: Pupils equal, round, reactive to light and accommodation. No scleral icterus. Extraocular muscles intact.  HEENT: Head atraumatic, normocephalic. Oropharynx and nasopharynx clear.  NECK:  Supple, no jugular venous distention. No thyroid enlargement, no tenderness.  LUNGS: Normal breath sounds bilaterally, no wheezing, some crepitation. No use of accessory muscles of respiration.  CARDIOVASCULAR: S1, S2 normal. No murmurs, rubs, or gallops.  ABDOMEN: Soft, nontender, nondistended. Bowel sounds present. No organomegaly or mass.  EXTREMITIES: No pedal edema, cyanosis, or clubbing.  NEUROLOGIC: Cranial nerves II through XII are intact. Muscle strength 5/5 in  all extremities. Sensation intact. Gait not checked.  PSYCHIATRIC: The patient is alert and oriented x 3.  SKIN: No obvious rash, lesion, or ulcer.   Physical Exam LABORATORY PANEL:   CBC  Recent Labs Lab 05/30/17 1943  WBC 4.6  HGB 12.7*  HCT 39.0*  PLT 216   ------------------------------------------------------------------------------------------------------------------  Chemistries   Recent Labs Lab 05/30/17 1943  NA 140  K 4.0  CL 104  CO2 26  GLUCOSE 171*  BUN 20  CREATININE 1.24  CALCIUM 9.2   ------------------------------------------------------------------------------------------------------------------  Cardiac Enzymes  Recent Labs Lab 05/31/17 1115 05/31/17 1654  TROPONINI 0.10* 0.10*   ------------------------------------------------------------------------------------------------------------------  RADIOLOGY:  Dg Chest 2 View  Result Date: 05/30/2017 CLINICAL DATA:  66 year old male with history of shortness of breath. EXAM: CHEST  2 VIEW COMPARISON:  Chest x-ray 03/19/2017. FINDINGS: There is cephalization of the pulmonary vasculature and slight indistinctness of the interstitial markings suggestive of mild pulmonary edema. Trace bilateral pleural effusions. No pneumothorax. No suspicious appearing pulmonary nodules or masses. Mild cardiomegaly. Upper mediastinal contours are within normal limits. IMPRESSION: 1. The appearance of the chest suggests mild congestive heart failure, as above. Electronically Signed   By: Trudie Reed M.D.   On: 05/30/2017 20:14    ASSESSMENT AND PLAN:   Active Problems:   NSTEMI (non-ST elevated myocardial infarction) (HCC)  1. NSTEMI- suspected on admission, but ruled out.:  Elevated troponins but no chest pain.   likely secondary to demand ischemia.   monitor telemetry. Consult cardiology. Continue aspirin   Plan for cath tomorrow. 2. CHF: Acute on chronic; systolic. Last EF reportedly 25%. Mild pulmonary  edema on chest x-ray.  Continue Lasix , monitor renal func. 3. Diabetes mellitus type 2: Continue basal insulin adjusted for hospital diet. Sliding scale insulin while hospitalized. 4. Hyperlipidemia: Continue statin therapy 5. COPD: Continue Spiriva 6. DVT prophylaxis: Lovenox 7. GI prophylaxis: None   All the records are reviewed and case discussed with Care Management/Social Workerr. Management plans discussed with the patient, family and they are in agreement.  CODE STATUS: full.  TOTAL TIME TAKING CARE OF THIS PATIENT: 35 minutes.     POSSIBLE D/C IN 1-2 DAYS, DEPENDING ON CLINICAL CONDITION.   Altamese Dilling M.D on 05/31/2017   Between 7am to 6pm - Pager - [phone]  After 6pm go to [email or web address] - Social research officer, government  [employer]  [employer]  Office  [phone]  CC: Primary care physician; [employer] [employer]  Note: This dictation was prepared with Nurse, children's dictation along with smaller Lobbyist. Any transcriptional errors that result from this process are unintentional.

## 2017-05-31 NOTE — H&P (Signed)
Jorge Cruz is an 66 y.o. male.   Chief Complaint: Cough HPI: The patient with past medical history of nonischemic cardio myopathy, hypertension, diabetes and CHF presents emergency department complaining of cough and shortness of breath. The patient reports that he is bringing up thin white phlegm with his cough. He has had some orthopnea as well as dyspnea on exertion. He denies chest pain, fever, nausea or vomiting. He received Lasix 40 mg IV in the emergency department to which she's had good urinary output. He states that he feels better. However, laboratory evaluation revealed elevated troponin which prompted emergency department staff to call the hospitalist service for admission.  Past Medical History:  Diagnosis Date  . Chronic systolic CHF (congestive heart failure) ([place])    a. 03/2014 Echo: EF 45%, glob HK; b. 03/2016 Echo: EF 20-25%, diff HK, Gr1 DD, mild MR.  Marland Kitchen COPD (chronic obstructive pulmonary disease) ([place])   . Diabetes mellitus without complication ([place])   . Hypertensive heart disease   . NICM (nonischemic cardiomyopathy) ([place])    a. 03/2014 Echo: EF 45%, glob HK, mild conc LVH; b. 03/2014 MV: possible mild ischemia, superimposed on small inf infarct, EF 36%; c. 03/2016 MV: EF <30%, no ischemia; d. 03/2016 Echo: EF 20-25%, diff HK, Gr1 DD, mild MR.  . Tobacco abuse     History reviewed. No pertinent surgical history.  Family History  Problem Relation Age of Onset  . Diabetes Mother   . Diabetes Father    Social History:  reports that he has been smoking.  He has a 50.00 pack-year smoking history. He has never used smokeless tobacco. He reports that he does not drink alcohol or use drugs.  Allergies: No Known Allergies   (Not in a hospital admission)  Results for orders placed or performed during the hospital encounter of 05/30/17 (from the past 48 hour(s))  Basic metabolic panel     Status: Abnormal   Collection Time: 05/30/17  7:43 PM  Result Value Ref Range   Sodium  140 135 - 145 mmol/L   Potassium 4.0 3.5 - 5.1 mmol/L   Chloride 104 101 - 111 mmol/L   CO2 26 22 - 32 mmol/L   Glucose, Bld 171 (H) 65 - 99 mg/dL   BUN 20 6 - 20 mg/dL   Creatinine, Ser 1.24 0.61 - 1.24 mg/dL   Calcium 9.2 8.9 - 10.3 mg/dL   GFR calc non Af Amer 59 (L) >60 mL/min   GFR calc Af Amer >60 >60 mL/min    Comment: (NOTE) The eGFR has been calculated using the CKD EPI equation. This calculation has not been validated in all clinical situations. eGFR's persistently <60 mL/min signify possible Chronic Kidney Disease.    Anion gap 10 5 - 15  CBC     Status: Abnormal   Collection Time: 05/30/17  7:43 PM  Result Value Ref Range   WBC 4.6 3.8 - 10.6 K/uL   RBC 4.07 (L) 4.40 - 5.90 MIL/uL   Hemoglobin 12.7 (L) 13.0 - 18.0 g/dL   HCT 39.0 (L) 40.0 - 52.0 %   MCV 95.8 80.0 - 100.0 fL   MCH 31.3 26.0 - 34.0 pg   MCHC 32.7 32.0 - 36.0 g/dL   RDW 15.8 (H) 11.5 - 14.5 %   Platelets 216 150 - 440 K/uL  Troponin I     Status: Abnormal   Collection Time: 05/30/17  7:43 PM  Result Value Ref Range   Troponin I 0.08 (HH) <0.03  ng/mL    Comment: CRITICAL RESULT CALLED TO, READ BACK BY AND VERIFIED WITH LISA THOMPSON AT 2047 ON 05/30/2017 JJB   Brain natriuretic peptide     Status: Abnormal   Collection Time: 05/30/17  7:43 PM  Result Value Ref Range   B Natriuretic Peptide 150.0 (H) 0.0 - 100.0 pg/mL  Troponin I     Status: Abnormal   Collection Time: 05/30/17 11:01 PM  Result Value Ref Range   Troponin I 0.10 (HH) <0.03 ng/mL    Comment: CRITICAL VALUE NOTED. VALUE IS CONSISTENT WITH PREVIOUSLY REPORTED/CALLED VALUE ALV   Dg Chest 2 View  Result Date: 05/30/2017 CLINICAL DATA:  66 year old male with history of shortness of breath. EXAM: CHEST  2 VIEW COMPARISON:  Chest x-ray 03/19/2017. FINDINGS: There is cephalization of the pulmonary vasculature and slight indistinctness of the interstitial markings suggestive of mild pulmonary edema. Trace bilateral pleural effusions. No  pneumothorax. No suspicious appearing pulmonary nodules or masses. Mild cardiomegaly. Upper mediastinal contours are within normal limits. IMPRESSION: 1. The appearance of the chest suggests mild congestive heart failure, as above. Electronically Signed   By: Vinnie Langton M.D.   On: 05/30/2017 20:14    Review of Systems  Constitutional: Negative for chills and fever.  HENT: Negative for sore throat and tinnitus.   Eyes: Negative for blurred vision and redness.  Respiratory: Positive for cough and shortness of breath.   Cardiovascular: Positive for orthopnea. Negative for chest pain, palpitations and PND.  Gastrointestinal: Negative for abdominal pain, diarrhea, nausea and vomiting.  Genitourinary: Negative for dysuria, frequency and urgency.  Musculoskeletal: Negative for joint pain and myalgias.  Skin: Negative for rash.       No lesions  Neurological: Negative for speech change, focal weakness and weakness.  Endo/Heme/Allergies: Does not bruise/bleed easily.       No temperature intolerance  Psychiatric/Behavioral: Negative for depression and suicidal ideas.    Blood pressure 129/78, pulse 90, temperature 98.2 F (36.8 C), temperature source Oral, resp. rate (!) 25, weight 100.7 kg (222 lb), SpO2 96 %. Physical Exam  Vitals reviewed. Constitutional: He is oriented to person, place, and time. He appears well-developed and well-nourished. No distress.  HENT:  Head: Normocephalic and atraumatic.  Mouth/Throat: Oropharynx is clear and moist.  Eyes: Pupils are equal, round, and reactive to light. Conjunctivae and EOM are normal. No scleral icterus.  Neck: Normal range of motion. Neck supple. No JVD present. No tracheal deviation present. No thyromegaly present.  Cardiovascular: Normal rate, regular rhythm and normal heart sounds.  Exam reveals no gallop and no friction rub.   No murmur heard. Respiratory: Effort normal and breath sounds normal. No respiratory distress. He has no  wheezes.  GI: Soft. Bowel sounds are normal. He exhibits no distension. There is no tenderness.  Genitourinary:  Genitourinary Comments: Deferred  Musculoskeletal: Normal range of motion. He exhibits no edema.  Lymphadenopathy:    He has no cervical adenopathy.  Neurological: He is alert and oriented to person, place, and time. No cranial nerve deficit.  Skin: Skin is warm and dry. No rash noted. No erythema.  Psychiatric: He has a normal mood and affect. His behavior is normal. Judgment and thought content normal.     Assessment/Plan This is a 66 year old male admitted for NSTEMI. 1. NSTEMI: Elevated troponins but no chest pain. Patient reportedly has nonischemic heart disease thus increase in cardiac biomarkers likely secondary to demand ischemia. We will continue to follow troponin as well as monitor telemetry.  Consult cardiology. Continue aspirin 2. CHF: Acute on chronic; systolic. Last EF reportedly 25%. Mild pulmonary edema on chest x-ray. Continue Lasix per home regimen. 3. Diabetes mellitus type 2: Continue basal insulin adjusted for hospital diet. Sliding scale insulin while hospitalized. 4. Hyperlipidemia: Continue statin therapy 5. COPD: Continue Spiriva 6. DVT prophylaxis: Lovenox 7. GI prophylaxis: None The patient is a full code. Time spent on admission orders and patient care approximately 45 minutes  Harrie Foreman, MD 05/31/2017, 2:27 AM

## 2017-05-31 NOTE — Progress Notes (Signed)
Patient admitted to unit. Oriented to room, call bell, and staff. Bed in lowest position. Fall safety plan reviewed. Full assessment to Epic. Skin assessment verified with Richardo Priest. Telemetry box verification with tele clerk- Box#:[id number]. Will continue to monitor.

## 2017-05-31 NOTE — Discharge Instructions (Signed)
Heart Failure Clinic appointment on June 07 2017 at 9:20am with Clarisa Kindred, FNP. Please call [phone] to reschedule.

## 2017-05-31 NOTE — Progress Notes (Signed)
Chaplain received an order to visit with pt in room 246. Chaplain provided information for an advanced directive to pt.     05/31/17 3833  Clinical Encounter Type  Visited With Patient  Visit Type Initial  Referral From Patient  Consult/Referral To Chaplain  Spiritual Encounters  Spiritual Needs Other (Comment)

## 2017-06-01 ENCOUNTER — Encounter
Admission: EM | Disposition: A | Discharge status: Home / Self Care | Payer: Self-pay | Source: Home / Self Care | Attending: Internal Medicine

## 2017-06-01 ENCOUNTER — Encounter: Payer: Self-pay | Admitting: Cardiovascular Disease

## 2017-06-01 DIAGNOSIS — I5023 Acute on chronic systolic (congestive) heart failure: Secondary | ICD-10-CM

## 2017-06-01 HISTORY — PX: RIGHT/LEFT HEART CATH AND CORONARY ANGIOGRAPHY: CATH118266

## 2017-06-01 LAB — BASIC METABOLIC PANEL
Anion gap: 8 (ref 5–15)
BUN: 24 mg/dL — ABNORMAL HIGH (ref 6–20)
CO2: 28 mmol/L (ref 22–32)
Calcium: 9.3 mg/dL (ref 8.9–10.3)
Chloride: 103 mmol/L (ref 101–111)
Creatinine, Ser: 1.18 mg/dL (ref 0.61–1.24)
GFR calc Af Amer: >60 mL/min (ref >60)
GFR calc non Af Amer: >60 mL/min (ref >60)
Glucose, Bld: 145 mg/dL — ABNORMAL HIGH (ref 65–99)
Potassium: 3.5 mmol/L (ref 3.5–5.1)
Sodium: 139 mmol/L (ref 135–145)

## 2017-06-01 LAB — CARDIAC CATHETERIZATION: Cath EF Quantitative: 20 %

## 2017-06-01 LAB — GLUCOSE, CAPILLARY
Glucose-Capillary: 121 mg/dL — ABNORMAL HIGH (ref 65–99)
Glucose-Capillary: 72 mg/dL (ref 65–99)

## 2017-06-01 SURGERY — RIGHT/LEFT HEART CATH AND CORONARY ANGIOGRAPHY
Anesthesia: Moderate Sedation

## 2017-06-01 MED ORDER — SODIUM CHLORIDE 0.9% FLUSH: 3.0000 mL | Freq: Two times a day (BID) | INTRAVENOUS | Status: DC

## 2017-06-01 MED ORDER — SODIUM CHLORIDE 0.9 % IV SOLN: 250.0000 mL | INTRAVENOUS | Status: DC | PRN

## 2017-06-01 MED ORDER — MIDAZOLAM HCL 2 MG/2ML IJ SOLN
INTRAMUSCULAR | Status: DC | PRN
  Administered 2017-06-01: 1 mg via INTRAVENOUS

## 2017-06-01 MED ORDER — IOPAMIDOL (ISOVUE-300) INJECTION 61%
INTRAVENOUS | Status: DC | PRN
  Administered 2017-06-01: 45 mL via INTRA_ARTERIAL

## 2017-06-01 MED ORDER — SODIUM CHLORIDE 0.9% FLUSH: 3.0000 mL | INTRAVENOUS | Status: DC | PRN

## 2017-06-01 MED ORDER — SODIUM CHLORIDE 0.9 % IV SOLN: INTRAVENOUS | Status: DC

## 2017-06-01 MED ORDER — VERAPAMIL HCL 2.5 MG/ML IV SOLN
INTRAVENOUS | Status: AC
  Filled 2017-06-01: qty 2

## 2017-06-01 MED ORDER — VERAPAMIL HCL 2.5 MG/ML IV SOLN
INTRAVENOUS | Status: DC | PRN
  Administered 2017-06-01: 2.5 mg via INTRA_ARTERIAL

## 2017-06-01 MED ORDER — INSULIN DETEMIR 100 UNIT/ML ~~LOC~~ SOLN: 30.0000 [IU] | Freq: Every day | SUBCUTANEOUS | 11 refills | Status: DC

## 2017-06-01 MED ORDER — FUROSEMIDE 20 MG PO TABS: 20.0000 mg | ORAL_TABLET | Freq: Every day | ORAL | Status: DC

## 2017-06-01 MED ORDER — ATORVASTATIN CALCIUM 20 MG PO TABS
40.0000 mg | ORAL_TABLET | Freq: Every day | ORAL | Status: DC
  Administered 2017-06-01: 40 mg via ORAL
  Filled 2017-06-01: qty 2

## 2017-06-01 MED ORDER — FENTANYL CITRATE (PF) 100 MCG/2ML IJ SOLN
INTRAMUSCULAR | Status: AC
  Filled 2017-06-01: qty 2

## 2017-06-01 MED ORDER — HEPARIN (PORCINE) IN NACL 2-0.9 UNIT/ML-% IJ SOLN
INTRAMUSCULAR | Status: AC
  Filled 2017-06-01: qty 500

## 2017-06-01 MED ORDER — HEPARIN SODIUM (PORCINE) 1000 UNIT/ML IJ SOLN
INTRAMUSCULAR | Status: DC | PRN
Start: 2017-06-01 — End: 2017-06-01
  Administered 2017-06-01: 4500 [IU] via INTRAVENOUS

## 2017-06-01 MED ORDER — POTASSIUM CHLORIDE CRYS ER 20 MEQ PO TBCR
20.0000 meq | EXTENDED_RELEASE_TABLET | Freq: Two times a day (BID) | ORAL | Status: DC
  Administered 2017-06-01: 20 meq via ORAL
  Filled 2017-06-01: qty 1

## 2017-06-01 MED ORDER — HEPARIN SODIUM (PORCINE) 1000 UNIT/ML IJ SOLN
INTRAMUSCULAR | Status: AC
  Filled 2017-06-01: qty 1

## 2017-06-01 MED ORDER — POTASSIUM CHLORIDE CRYS ER 20 MEQ PO TBCR: 20.0000 meq | EXTENDED_RELEASE_TABLET | Freq: Every day | ORAL | 0 refills | Status: DC

## 2017-06-01 MED ORDER — MIDAZOLAM HCL 2 MG/2ML IJ SOLN
INTRAMUSCULAR | Status: AC
  Filled 2017-06-01: qty 2

## 2017-06-01 MED ORDER — LOSARTAN POTASSIUM 25 MG PO TABS: 25.0000 mg | ORAL_TABLET | Freq: Every day | ORAL | 0 refills | Status: DC

## 2017-06-01 MED ORDER — FENTANYL CITRATE (PF) 100 MCG/2ML IJ SOLN
INTRAMUSCULAR | Status: DC | PRN
  Administered 2017-06-01: 25 ug via INTRAVENOUS

## 2017-06-01 SURGICAL SUPPLY — 8 items
CATH BALLN WEDGE 5F 110CM (CATHETERS) ×2 IMPLANT
CATH OPTITORQUE JACKY 4.0 5F (CATHETERS) ×2 IMPLANT
DEVICE RAD COMP TR BAND LRG (VASCULAR PRODUCTS) ×2 IMPLANT
GLIDESHEATH SLEND SS 6F .021 (SHEATH) ×4 IMPLANT
KIT MANI 3VAL PERCEP (MISCELLANEOUS) ×2 IMPLANT
KIT RIGHT HEART (MISCELLANEOUS) ×2 IMPLANT
PACK CARDIAC CATH (CUSTOM PROCEDURE TRAY) ×2 IMPLANT
WIRE ROSEN-J .035X260CM (WIRE) ×2 IMPLANT

## 2017-06-01 NOTE — Progress Notes (Signed)
Progress Note  Patient Name: Jorge Cruz Date of Encounter: 06/01/2017  Primary Cardiologist: Dr. Okey Dupre  Subjective   The patient feels better overall with less shortness of breath. No chest pain.  Inpatient Medications    Scheduled Meds: . [MAR Hold] aspirin EC  81 mg Oral Daily  . [MAR Hold] atorvastatin  80 mg Oral Daily  . [MAR Hold] carvedilol  3.125 mg Oral BID WC  . [MAR Hold] docusate sodium  100 mg Oral BID  . [MAR Hold] enoxaparin (LOVENOX) injection  40 mg Subcutaneous Q24H  . [MAR Hold] insulin aspart  0-15 Units Subcutaneous TID WC  . [MAR Hold] insulin detemir  30 Units Subcutaneous QHS  . [MAR Hold] losartan  25 mg Oral Daily  . [MAR Hold] pneumococcal 23 valent vaccine  0.5 mL Intramuscular Tomorrow-1000  . [MAR Hold] potassium chloride  20 mEq Oral BID  . sodium chloride flush  3 mL Intravenous Q12H  . [MAR Hold] tiotropium  18 mcg Inhalation Daily   Continuous Infusions: . sodium chloride    . [START ON 06/02/2017] sodium chloride     PRN Meds: sodium chloride, [MAR Hold] acetaminophen **OR** [MAR Hold] acetaminophen, [MAR Hold] cyclobenzaprine, [MAR Hold] guaiFENesin-dextromethorphan, [MAR Hold]  morphine injection, [MAR Hold] nitroGLYCERIN, [MAR Hold] ondansetron **OR** [MAR Hold] ondansetron (ZOFRAN) IV, sodium chloride flush   Vital Signs    Vitals:   06/01/17 0820 06/01/17 1141 06/01/17 1234 06/01/17 1330  BP: 107/66 132/73  (!) 123/98  Pulse: 76   94  Resp: 18 15    Temp: 98.4 F (36.9 C)     TempSrc: Oral     SpO2: 97%  91% 96%  Weight:      Height:        Intake/Output Summary (Last 24 hours) at 06/01/17 1350 Last data filed at 06/01/17 1100  Gross per 24 hour  Intake              240 ml  Output              450 ml  Net             -210 ml   Filed Weights   05/30/17 1947 05/31/17 0252 06/01/17 0500  Weight: 222 lb (100.7 kg) 213 lb 6.4 oz (96.8 kg) 209 lb 14.4 oz (95.2 kg)    Telemetry    Normal sinus rhythm - Personally  Reviewed  ECG     Physical Exam   GEN: No acute distress.   Neck: No JVD Cardiac: RRR, no murmurs, rubs, or gallops.  Respiratory: Clear to auscultation bilaterally. GI: Soft, nontender, non-distended  MS: No edema; No deformity. Neuro:  Nonfocal  Psych: Normal affect   Labs    Chemistry Recent Labs Lab 05/30/17 1943 06/01/17 0423  NA 140 139  K 4.0 3.5  CL 104 103  CO2 26 28  GLUCOSE 171* 145*  BUN 20 24*  CREATININE 1.24 1.18  CALCIUM 9.2 9.3  GFRNONAA 59* >60  GFRAA >60 >60  ANIONGAP 10 8     Hematology Recent Labs Lab 05/30/17 1943  WBC 4.6  RBC 4.07*  HGB 12.7*  HCT 39.0*  MCV 95.8  MCH 31.3  MCHC 32.7  RDW 15.8*  PLT 216    Cardiac Enzymes Recent Labs Lab 05/30/17 2301 05/31/17 0539 05/31/17 1115 05/31/17 1654  TROPONINI 0.10* 0.10* 0.10* 0.10*   No results for input(s): TROPIPOC in the last 168 hours.   BNP Recent Labs Lab  05/30/17 1943  BNP 150.0*     DDimer No results for input(s): DDIMER in the last 168 hours.   Radiology    Dg Chest 2 View  Result Date: 05/30/2017 CLINICAL DATA:  66 year old male with history of shortness of breath. EXAM: CHEST  2 VIEW COMPARISON:  Chest x-ray 03/19/2017. FINDINGS: There is cephalization of the pulmonary vasculature and slight indistinctness of the interstitial markings suggestive of mild pulmonary edema. Trace bilateral pleural effusions. No pneumothorax. No suspicious appearing pulmonary nodules or masses. Mild cardiomegaly. Upper mediastinal contours are within normal limits. IMPRESSION: 1. The appearance of the chest suggests mild congestive heart failure, as above. Electronically Signed   By: Trudie Reed M.D.   On: 05/30/2017 20:14    Cardiac Studies   Cardiac cath today: 1. Mild nonobstructive coronary artery disease. 2. Severely reduced LV systolic function with an ejection fraction of 20%. 3. Right heart catheterization showed normal filling pressures, normal pulmonary pressure  and moderately reduced cardiac output. Cardiac output was 3.82 with cardiac index of 1.83. Pulmonary capillary wedge pressure was 5 mmHg.  Recommendations: Continue medical therapy for nonischemic cardiomyopathy. The patient does not appear to be volume overloaded. Resume small dose furosemide tomorrow. We can consider adding digoxin and spironolactone in the future but that has to be done in the outpatient setting once we make sure good compliance.  Patient Profile     66 y.o. male with known history of chronic systolic heart failure, COPD, diabetes mellitus, hypertension, polysubstance abuse and noncompliance with medical therapy presented with acute on chronic systolic heart failure.  Assessment & Plan    1. Acute on chronic systolic heart failure: The patient appears to be euvolemic. Cardiac catheterization showed no evidence of significant coronary artery disease. The patient has nonischemic cardiomyopathy. Right heart catheterization showed normal filling pressures and no evidence of pulmonary hypertension. He does not appear to be volume overloaded. We can resume furosemide 20 mg once daily starting tomorrow. Continue treatment with carvedilol and losartan. The patient can probably be discharged home from a cardiac standpoint with close follow-up. If his compliance improves, I recommend considering digoxin and spironolactone.  2. Hypertension: Blood pressure is controlled on losartan and carvedilol.  The patient should follow-up with cardiology in 1- 2 weeks.  For questions or updates, please contact [facility] Please consult [email or web address] for contact info under Cardiology/STEMI.      Signed, Lorine Bears, MD  06/01/2017, 1:50 PM

## 2017-06-01 NOTE — Plan of Care (Signed)
Problem: Food- and Nutrition-Related Knowledge Deficit (NB-1.1) Goal: Nutrition education Formal process to instruct or train a patient/client in a skill or to impart knowledge to help patients/clients voluntarily manage or modify food choices and eating behavior to maintain or improve health.  Outcome: Adequate for Discharge Nutrition Education Note  RD consulted for nutrition education regarding a Heart Healthy diet.   Spoke with patient and wife at bedside. He reports eating well PTA, normally eats bacon, sausage, scrambled eggs for breakfast. Cooked meals for lunch and dinner generally baked chicken with vegetables or something along those lines. Denies weight loss, denies poor appetite. He reports that "I know what to do, I just have to do it." Encouraged him to eat less sodium, and eat less saturated fat. Patient reports using wesson oil at home to cook encouraged him to switch to canola or olive oil. He also is still smoking, trying to quit that as well.  Lipid Panel     Component Value Date/Time   CHOL 151 04/01/2016 1240   TRIG 31 04/01/2016 1240   HDL 46 04/01/2016 1240   CHOLHDL 3.3 04/01/2016 1240   VLDL 6 04/01/2016 1240   LDLCALC 99 04/01/2016 1240    RD provided "Heart Healthy Nutrition Therapy" handout from the Academy [employer]. Reviewed patient's dietary recall. Provided examples on ways to decrease sodium and fat intake in diet. Discouraged intake of processed foods and use of salt shaker. Encouraged fresh fruits and vegetables as well as whole grain sources of carbohydrates to maximize fiber intake. Teach back method used.  Expect fair compliance.  Body mass index is 31.92 kg/m. Pt meets criteria for obese class I based on current BMI.  Current diet order is heart healthy/carb modified, patient is consuming approximately 0-70% of meals at this time. Labs and medications reviewed. No further nutrition interventions warranted at this time. RD contact  information provided. If additional nutrition issues arise, please re-consult RD.  Jorge Cruz. Kally Cadden, MS, RD LDN Inpatient Clinical Dietitian Pager [phone]

## 2017-06-01 NOTE — Progress Notes (Signed)
Went over discharge instructions including medication and follow up appointment with the patient and wife. Discontinue peripheral IV and telemetry monitor. RN was going to administer pneumonia vaccine but patient left. NT transport patient.

## 2017-06-01 NOTE — Discharge Summary (Signed)
[facility] -  at [facility]   PATIENT NAME: Jorge Cruz    MR#:  [id number]  DATE OF BIRTH:  04/09/1951  DATE OF ADMISSION:  05/30/2017 ADMITTING PHYSICIAN: Arnaldo Natal, MD  DATE OF DISCHARGE: 06/01/2017  PRIMARY CARE PHYSICIAN: [facility]    ADMISSION DIAGNOSIS:  Chest pain, unspecified type [R07.9] Acute on chronic congestive heart failure, unspecified heart failure type (HCC) [I50.9] NSTEMI (non-ST elevated myocardial infarction) (HCC) [I21.4]  DISCHARGE DIAGNOSIS:  Active Problems:   NSTEMI (non-ST elevated myocardial infarction) (HCC)- ruled out.   Acute on chronic systolic heart failure (HCC)   SECONDARY DIAGNOSIS:   Past Medical History:  Diagnosis Date  . Chronic systolic CHF (congestive heart failure) (HCC)    a. 03/2014 Echo: EF 45%, glob HK; b. 03/2016 Echo: EF 20-25%, diff HK, Gr1 DD, mild MR.  Marland Kitchen COPD (chronic obstructive pulmonary disease) (HCC)   . Diabetes mellitus without complication (HCC)   . Hypertensive heart disease   . NICM (nonischemic cardiomyopathy) (HCC)    a. 03/2014 Echo: EF 45%, glob HK, mild conc LVH; b. 03/2014 MV: possible mild ischemia, superimposed on small inf infarct, EF 36%; c. 03/2016 MV: EF <30%, no ischemia; d. 03/2016 Echo: EF 20-25%, diff HK, Gr1 DD, mild MR.  . Polysubstance abuse (HCC)   . Tobacco abuse     HOSPITAL COURSE:   1. NSTEMI- suspected on admission, but ruled out.:  Elevated troponins but no chest pain.   likely secondary to demand ischemia.   monitor telemetry. Consultcardiology.Continue aspirin   Plan for cath - done on 06/01/17, as follows.   Acute on chronic systolic heart failure: The patient appears to be euvolemic. Cardiac catheterization showed no evidence of significant coronary artery disease. The patient has nonischemic cardiomyopathy. Right heart catheterization showed normal filling pressures and no evidence of pulmonary hypertension. He does not  appear to be volume overloaded. We can resume furosemide 20 mg once daily starting tomorrow. Continue treatment with carvedilol and losartan. The patient can probably be discharged home from a cardiac standpoint with close follow-up. If his compliance improves, I recommend considering digoxin and spironolactone.  2. CHF: Acute on chronic; systolic. Last EF reportedly 25%. Mild pulmonary edema on chest x-ray. Continue Lasix , monitor renal func. 3. Diabetes mellitus type 2: Continue basal insulin adjusted for hospital diet. Sliding scale insulin whilehospitalized. 4. Hyperlipidemia: Continue statin therapy 5. COPD: Continue Spiriva 6. DVT prophylaxis: Lovenox 7. GI prophylaxis: None  DISCHARGE CONDITIONS:    CONSULTS OBTAINED:  Treatment Team:  Yvonne Kendall, MD  DRUG ALLERGIES:  No Known Allergies  DISCHARGE MEDICATIONS:   Current Discharge Medication List    START taking these medications   Details  losartan (COZAAR) 25 MG tablet Take 1 tablet (25 mg total) by mouth daily. Qty: 30 tablet, Refills: 0    potassium chloride SA (K-DUR,KLOR-CON) 20 MEQ tablet Take 1 tablet (20 mEq total) by mouth daily. Qty: 30 tablet, Refills: 0      CONTINUE these medications which have CHANGED   Details  insulin detemir (LEVEMIR) 100 UNIT/ML injection Inject 0.3 mLs (30 Units total) into the skin at bedtime. Qty: 10 mL, Refills: 11      CONTINUE these medications which have NOT CHANGED   Details  aspirin EC 81 MG tablet Take 1 tablet (81 mg total) by mouth daily. Qty: 30 tablet, Refills: 1    atorvastatin (LIPITOR) 80 MG tablet Take 80 mg by mouth daily.  furosemide (LASIX) 20 MG tablet Take 1 tablet (20 mg total) by mouth daily. Qty: 30 tablet, Refills: 6    metFORMIN (GLUCOPHAGE) 1000 MG tablet Take 1 tablet by mouth 2 (two) times daily.    tiotropium (SPIRIVA) 18 MCG inhalation capsule Place 18 mcg into inhaler and inhale daily.    benzonatate (TESSALON PERLES) 100 MG  capsule 1-2 every 8 hours prn cough Qty: 30 capsule, Refills: 0    carvedilol (COREG) 12.5 MG tablet Take 1 tablet (12.5 mg total) by mouth 2 (two) times daily with a meal. Qty: 60 tablet, Refills: 6      STOP taking these medications     doxycycline (VIBRAMYCIN) 100 MG capsule          DISCHARGE INSTRUCTIONS:    Follow in cardiology clinic in 1 week.  If you experience worsening of your admission symptoms, develop shortness of breath, life threatening emergency, suicidal or homicidal thoughts you must seek medical attention immediately by calling 911 or calling your MD immediately  if symptoms less severe.  You Must read complete instructions/literature along with all the possible adverse reactions/side effects for all the Medicines you take and that have been prescribed to you. Take any new Medicines after you have completely understood and accept all the possible adverse reactions/side effects.   Please note  You were cared for by a hospitalist during your hospital stay. If you have any questions about your discharge medications or the care you received while you were in the hospital after you are discharged, you can call the unit and asked to speak with the hospitalist on call if the hospitalist that took care of you is not available. Once you are discharged, your primary care physician will handle any further medical issues. Please note that NO REFILLS for any discharge medications will be authorized once you are discharged, as it is imperative that you return to your primary care physician (or establish a relationship with a primary care physician if you do not have one) for your aftercare needs so that they can reassess your need for medications and monitor your lab values.    Today   CHIEF COMPLAINT:   Chief Complaint  Patient presents with  . Shortness of Breath    HISTORY OF PRESENT ILLNESS:  Jorge Cruz  is a 66 y.o. male with a known history of nonischemic cardio  myopathy, hypertension, diabetes and CHF presents emergency department complaining of cough and shortness of breath. The patient reports that he is bringing up thin white phlegm with his cough. He has had some orthopnea as well as dyspnea on exertion. He denies chest pain, fever, nausea or vomiting. He received Lasix 40 mg IV in the emergency department to which she's had good urinary output. He states that he feels better. However, laboratory evaluation revealed elevated troponin which prompted emergency department staff to call the hospitalist service for admission.   VITAL SIGNS:  Blood pressure (!) 114/52, pulse 92, temperature 98.1 F (36.7 C), temperature source Oral, resp. rate 20, height 5\' 8"  (1.727 m), weight 95.2 kg (209 lb 14.4 oz), SpO2 94 %.  I/O:   Intake/Output Summary (Last 24 hours) at 06/01/17 1657 Last data filed at 06/01/17 1100  Gross per 24 hour  Intake              240 ml  Output              450 ml  Net             -  210 ml    PHYSICAL EXAMINATION:   GENERAL:  66 y.o.-year-old patient lying in the bed with no acute distress.  EYES: Pupils equal, round, reactive to light and accommodation. No scleral icterus. Extraocular muscles intact.  HEENT: Head atraumatic, normocephalic. Oropharynx and nasopharynx clear.  NECK:  Supple, no jugular venous distention. No thyroid enlargement, no tenderness.  LUNGS: Normal breath sounds bilaterally, no wheezing, some crepitation. No use of accessory muscles of respiration.  CARDIOVASCULAR: S1, S2 normal. No murmurs, rubs, or gallops.  ABDOMEN: Soft, nontender, nondistended. Bowel sounds present. No organomegaly or mass.  EXTREMITIES: No pedal edema, cyanosis, or clubbing.  NEUROLOGIC: Cranial nerves II through XII are intact. Muscle strength 5/5 in all extremities. Sensation intact. Gait not checked.  PSYCHIATRIC: The patient is alert and oriented x 3.  SKIN: No obvious rash, lesion, or ulcer.   DATA REVIEW:   CBC  Recent  Labs Lab 05/30/17 1943  WBC 4.6  HGB 12.7*  HCT 39.0*  PLT 216    Chemistries   Recent Labs Lab 06/01/17 0423  NA 139  K 3.5  CL 103  CO2 28  GLUCOSE 145*  BUN 24*  CREATININE 1.18  CALCIUM 9.3    Cardiac Enzymes  Recent Labs Lab 05/31/17 1654  TROPONINI 0.10*    Microbiology Results  No results found for this or any previous visit.  RADIOLOGY:  Dg Chest 2 View  Result Date: 05/30/2017 CLINICAL DATA:  66 year old male with history of shortness of breath. EXAM: CHEST  2 VIEW COMPARISON:  Chest x-ray 03/19/2017. FINDINGS: There is cephalization of the pulmonary vasculature and slight indistinctness of the interstitial markings suggestive of mild pulmonary edema. Trace bilateral pleural effusions. No pneumothorax. No suspicious appearing pulmonary nodules or masses. Mild cardiomegaly. Upper mediastinal contours are within normal limits. IMPRESSION: 1. The appearance of the chest suggests mild congestive heart failure, as above. Electronically Signed   By: Trudie Reed M.D.   On: 05/30/2017 20:14    EKG:   Orders placed or performed during the hospital encounter of 05/30/17  . EKG 12-Lead  . EKG 12-Lead  . ED EKG within 10 minutes  . ED EKG within 10 minutes      Management plans discussed with the patient, family and they are in agreement.  CODE STATUS:     Code Status Orders        Start     Ordered   05/31/17 0253  Full code  Continuous     05/31/17 0252    Code Status History    Date Active Date Inactive Code Status Order ID Comments User Context   03/31/2016 11:59 PM 04/01/2016 12:47 PM Full Code [id number]  Oralia Manis, MD Inpatient      TOTAL TIME TAKING CARE OF THIS PATIENT: 35 minutes.    Altamese Dilling M.D on 06/01/2017 at 4:57 PM  Between 7am to 6pm - Pager - [phone]  After 6pm go to [email or web address] - Social research officer, government  [facility]  Office  [phone]  CC: Primary care physician; [employer] [employer]   Note: This dictation was prepared with Nurse, children's dictation along with smaller Lobbyist. Any transcriptional errors that result from this process are unintentional.

## 2017-06-01 NOTE — Progress Notes (Signed)
Pt off the floor to specials

## 2017-06-02 LAB — GLUCOSE, CAPILLARY: Glucose-Capillary: 342 mg/dL — ABNORMAL HIGH (ref 65–99)

## 2017-06-02 NOTE — Care Management Important Message (Signed)
Important Message  Patient Details  Name: Jorge Cruz MRN: [id number] Date of Birth: Nov 21, 1950   Medicare Important Message Given:  N/A - LOS <3 / Initial given by admissions    Eber Hong, RN 06/02/2017, 8:41 AM

## 2017-06-07 ENCOUNTER — Ambulatory Visit: Payer: Medicare Other | Attending: Family | Admitting: Family

## 2017-06-07 ENCOUNTER — Encounter: Payer: Self-pay | Admitting: Family

## 2017-06-07 VITALS — BP 125/66 | HR 85 | Resp 20 | Ht 68.0 in | Wt 219.0 lb

## 2017-06-07 DIAGNOSIS — I110 Hypertensive heart disease with heart failure: Secondary | ICD-10-CM | POA: Insufficient documentation

## 2017-06-07 DIAGNOSIS — E119 Type 2 diabetes mellitus without complications: Secondary | ICD-10-CM

## 2017-06-07 DIAGNOSIS — L03314 Cellulitis of groin: Secondary | ICD-10-CM | POA: Diagnosis not present

## 2017-06-07 DIAGNOSIS — J449 Chronic obstructive pulmonary disease, unspecified: Secondary | ICD-10-CM | POA: Diagnosis not present

## 2017-06-07 DIAGNOSIS — Z794 Long term (current) use of insulin: Secondary | ICD-10-CM | POA: Insufficient documentation

## 2017-06-07 DIAGNOSIS — I10 Essential (primary) hypertension: Secondary | ICD-10-CM

## 2017-06-07 DIAGNOSIS — I5022 Chronic systolic (congestive) heart failure: Secondary | ICD-10-CM | POA: Diagnosis present

## 2017-06-07 DIAGNOSIS — I429 Cardiomyopathy, unspecified: Secondary | ICD-10-CM | POA: Diagnosis not present

## 2017-06-07 DIAGNOSIS — F17210 Nicotine dependence, cigarettes, uncomplicated: Secondary | ICD-10-CM | POA: Diagnosis not present

## 2017-06-07 DIAGNOSIS — I2510 Atherosclerotic heart disease of native coronary artery without angina pectoris: Secondary | ICD-10-CM | POA: Insufficient documentation

## 2017-06-07 DIAGNOSIS — Z7982 Long term (current) use of aspirin: Secondary | ICD-10-CM | POA: Diagnosis not present

## 2017-06-07 DIAGNOSIS — Z79899 Other long term (current) drug therapy: Secondary | ICD-10-CM | POA: Diagnosis not present

## 2017-06-07 DIAGNOSIS — Z720 Tobacco use: Secondary | ICD-10-CM

## 2017-06-07 LAB — GLUCOSE, CAPILLARY: Glucose-Capillary: 142 mg/dL — ABNORMAL HIGH (ref 65–99)

## 2017-06-07 MED ORDER — CARVEDILOL 3.125 MG PO TABS: 3.1250 mg | ORAL_TABLET | Freq: Two times a day (BID) | ORAL | 3 refills | Status: DC

## 2017-06-07 NOTE — Progress Notes (Signed)
Patient ID: Jorge Cruz, male    DOB: 08/16/1951, 66 y.o.   MRN: [id number]  HPI  Mr Koller is a 66 y/o male with a history of DM, COPD, HTN, current tobacco use and chronic heart failure.  Echo report from 04/01/16 was reviewed and shows an EF of 20-25% along with mild MR. Cardiac catheterization was done 06/01/17 which showed an EF of 20% along with mild nonobstructive CAD. RHC also done which showed normal filling pressures, normal pulmonary pressure and moderately reduced cardiac output. Cardiac output was 3.82 with a cardiac index of 1.83.  Admitted 05/30/17 due to chest pain and HF exacerbation. NSTEMI was ruled out. Elevated troponins thought to be due to demand ischemia. Cardiology consult was obtained. LHC/RHC was done. Discharged home after 2 days. Was in the ED 05/15/17 due to shortness of breath. Treated and released. Was in the ED 03/19/17 due to a cough. Treated and released. Was in the ED 12/28/16 due to groin cellulitis. Treated and released.   He presents today for his initial visit with a chief complaint of mild shortness of breath upon moderate exertion. He says that this has been chronic in nature having been present with varying degrees of severity over a few years. He has associated fatigue, cough, chest pain and light-headedness along with this. He denies any edema, palpitations, difficulty sleeping or weight gain.   Past Medical History:  Diagnosis Date  . Chronic systolic CHF (congestive heart failure) (HCC)    a. 03/2014 Echo: EF 45%, glob HK; b. 03/2016 Echo: EF 20-25%, diff HK, Gr1 DD, mild MR.  Marland Kitchen COPD (chronic obstructive pulmonary disease) (HCC)   . Diabetes mellitus without complication (HCC)   . Hypertensive heart disease   . NICM (nonischemic cardiomyopathy) (HCC)    a. 03/2014 Echo: EF 45%, glob HK, mild conc LVH; b. 03/2014 MV: possible mild ischemia, superimposed on small inf infarct, EF 36%; c. 03/2016 MV: EF <30%, no ischemia; d. 03/2016 Echo: EF 20-25%, diff HK, Gr1  DD, mild MR.  . Polysubstance abuse (HCC)   . Tobacco abuse    Past Surgical History:  Procedure Laterality Date  . RIGHT/LEFT HEART CATH AND CORONARY ANGIOGRAPHY N/A 06/01/2017   Procedure: RIGHT/LEFT HEART CATH AND CORONARY ANGIOGRAPHY;  Surgeon: Iran Ouch, MD;  Location: [facility] INVASIVE CV LAB;  Service: Cardiovascular;  Laterality: N/A;   Family History  Problem Relation Age of Onset  . Diabetes Mother   . Diabetes Father    Social History  Substance Use Topics  . Smoking status: Current Every Day Smoker    Packs/day: 1.00    Years: 50.00  . Smokeless tobacco: Never Used  . Alcohol use No   No Known Allergies Prior to Admission medications   Medication Sig Start Date End Date Taking? Authorizing Provider  aspirin EC 81 MG tablet Take 1 tablet (81 mg total) by mouth daily. 04/01/16  Yes Enedina Finner, MD  atorvastatin (LIPITOR) 80 MG tablet Take 80 mg by mouth daily.   Yes [provider]  furosemide (LASIX) 20 MG tablet Take 1 tablet (20 mg total) by mouth daily. 05/03/16 05/30/18 Yes End, [name], MD  insulin detemir (LEVEMIR) 100 UNIT/ML injection Inject 50 Units into the skin at bedtime.   Yes [provider]  metFORMIN (GLUCOPHAGE) 1000 MG tablet Take 1 tablet by mouth 2 (two) times daily.   Yes [provider]  potassium chloride SA (K-DUR,KLOR-CON) 20 MEQ tablet Take 1 tablet (20 mEq total) by mouth  daily. 06/01/17  Yes Altamese Dilling, MD  carvedilol (COREG) 12.5 MG tablet Take 1 tablet (12.5 mg total) by mouth 2 (two) times daily with a meal. Patient not taking: Reported on 05/30/2017 05/03/16   End, [name], MD  losartan (COZAAR) 25 MG tablet Take 1 tablet (25 mg total) by mouth daily. 06/02/17   Altamese Dilling, MD    Review of Systems  Constitutional: Positive for appetite change and fatigue (fluctuates).  HENT: Positive for rhinorrhea. Negative for congestion and sore throat.   Eyes: Negative.   Respiratory:  Positive for cough (dry cough) and shortness of breath (sometimes). Negative for chest tightness.   Cardiovascular: Positive for chest pain ("nagging"). Negative for palpitations and leg swelling.  Gastrointestinal: Negative for abdominal distention and abdominal pain.  Endocrine: Negative.   Genitourinary: Negative.   Musculoskeletal: Negative for back pain and neck pain.  Skin: Negative.   Allergic/Immunologic: Negative.   Neurological: Positive for light-headedness (at times). Negative for dizziness.  Hematological: Negative for adenopathy. Does not bruise/bleed easily.  Psychiatric/Behavioral: Negative for dysphoric mood and sleep disturbance (sleeping on 2 pillows).   Vitals:   06/07/17 0945  BP: 125/66  Pulse: 85  Resp: 20  SpO2: 100%  Weight: 219 lb (99.3 kg)  Height: 5\' 8"  (1.727 m)   Wt Readings from Last 3 Encounters:  06/07/17 219 lb (99.3 kg)  06/01/17 209 lb 14.4 oz (95.2 kg)  03/19/17 222 lb (100.7 kg)   Lab Results  Component Value Date   CREATININE 1.18 06/01/2017   CREATININE 1.24 05/30/2017   CREATININE 1.12 05/03/2016   Physical Exam  Constitutional: He is oriented to person, place, and time. He appears well-developed and well-nourished.  HENT:  Head: Normocephalic and atraumatic.  Neck: Normal range of motion. Neck supple. No JVD present.  Cardiovascular: Normal rate and regular rhythm.   Pulmonary/Chest: Effort normal. He has no wheezes. He has no rales.  Abdominal: Soft. He exhibits no distension. There is no tenderness.  Musculoskeletal: He exhibits no edema or tenderness.  Neurological: He is alert and oriented to person, place, and time.  Skin: Skin is warm and dry.  Psychiatric: He has a normal mood and affect. His behavior is normal. Thought content normal.  Nursing note and vitals reviewed.   Assessment & Plan:  1: Chronic heart failure with reduced ejection fraction- - NYHA class II - euvolemic today - already weighing daily. Instructed  to call for an overnight weight gain of >2 pounds or a weekly weight gain of >5 pounds - not adding salt and has been reading food labels. Discussed the importance of keeping sodium intake to 2000mg  daily. Written dietary information was given to him about this - says that he's not drinking "a lot". Discussed keeping his daily fluid intake to between 40-60 ounces of fluid daily - saw cardiologist (Sivak) 06/05/17 - PharmD went in and reviewed medications although patient did not have a list and couldn't recall clearly what he was taking - consider changing his losartan to entresto in the future  2: HTN- - BP looks good today - BMP from 06/01/17 reviewed and shows sodium 139, potassium 3.5 and GFR >60  3: DM- - glucose in clinic was 142 nonfasting - A1c on 05/31/17 was 7.6%, down from 9.9% back in August 2017  4:Tobacco use-  - smokes ~ 1 pack of cigarettes daily - complete cessation discussed for 3 minutes with the patient  Patient did not bring his medications nor a list.   Patient's  wife called back after they left and verbally went over all his medications. Has not been taking carvedilol as he thought he was told to stop it. Will resume it at 3.125mg  BID with the goal to titrate it up. Emphasized the importance of bringing his medication bottles to every visit every time.  Return in 2 weeks or sooner for any questions/problems before then.

## 2017-06-07 NOTE — Patient Instructions (Addendum)
Continue weighing daily and call for an overnight weight gain of > 2 pounds or a weekly weight gain of >5 pounds.  Drink between 40-60 ounces of fluid daily.  

## 2017-06-09 ENCOUNTER — Encounter: Payer: Self-pay | Admitting: Family

## 2017-06-09 DIAGNOSIS — Z720 Tobacco use: Secondary | ICD-10-CM | POA: Insufficient documentation

## 2017-06-09 DIAGNOSIS — I5022 Chronic systolic (congestive) heart failure: Secondary | ICD-10-CM | POA: Insufficient documentation

## 2017-06-09 DIAGNOSIS — F17200 Nicotine dependence, unspecified, uncomplicated: Secondary | ICD-10-CM | POA: Insufficient documentation

## 2017-06-09 DIAGNOSIS — I5023 Acute on chronic systolic (congestive) heart failure: Secondary | ICD-10-CM | POA: Insufficient documentation

## 2017-06-09 DIAGNOSIS — I5042 Chronic combined systolic (congestive) and diastolic (congestive) heart failure: Secondary | ICD-10-CM | POA: Insufficient documentation

## 2017-06-20 ENCOUNTER — Telehealth: Payer: Self-pay | Admitting: Family

## 2017-06-20 ENCOUNTER — Ambulatory Visit: Payer: Medicare Other | Admitting: Family

## 2017-06-20 NOTE — Telephone Encounter (Signed)
Patient did not show for his Heart Failure Clinic appointment on 06/20/17. Will attempt to reschedule.

## 2017-07-20 DIAGNOSIS — Z5181 Encounter for therapeutic drug level monitoring: Secondary | ICD-10-CM | POA: Diagnosis not present

## 2017-07-24 ENCOUNTER — Telehealth: Payer: Self-pay | Admitting: Family

## 2017-07-24 ENCOUNTER — Ambulatory Visit: Payer: Medicare Other | Admitting: Family

## 2017-07-24 NOTE — Telephone Encounter (Signed)
Patient did not show for his Heart Failure Clinic appointment on 07/24/17. Will attempt to reschedule.  

## 2017-07-24 NOTE — Progress Notes (Deleted)
Patient ID: Jorge Cruz, male    DOB: 03-Sep-1950, 66 y.o.   MRN: [id number]  HPI  Jorge Cruz is a 66 y/o male with a history of DM, COPD, HTN, current tobacco use and chronic heart failure.  Echo report from 04/01/16 was reviewed and shows an EF of 20-25% along with mild Jorge. Cardiac catheterization was done 06/01/17 which showed an EF of 20% along with mild nonobstructive CAD. RHC also done which showed normal filling pressures, normal pulmonary pressure and moderately reduced cardiac output. Cardiac output was 3.82 with a cardiac index of 1.83.  Admitted 05/30/17 due to chest pain and HF exacerbation. NSTEMI was ruled out. Elevated troponins thought to be due to demand ischemia. Cardiology consult was obtained. LHC/RHC was done. Discharged home after 2 days. Was in the ED 05/15/17 due to shortness of breath. Treated and released. Was in the ED 03/19/17 due to a cough. Treated and released. Was in the ED 12/28/16 due to groin cellulitis. Treated and released.   He presents today for his initial visit with a chief complaint of mild shortness of breath upon moderate exertion. He says that this has been chronic in nature having been present with varying degrees of severity over a few years. He has associated fatigue, cough, chest pain and light-headedness along with this. He denies any edema, palpitations, difficulty sleeping or weight gain.   Past Medical History:  Diagnosis Date  . Chronic systolic CHF (congestive heart failure) (HCC)    a. 03/2014 Echo: EF 45%, glob HK; b. 03/2016 Echo: EF 20-25%, diff HK, Gr1 DD, mild Jorge.  Marland Kitchen COPD (chronic obstructive pulmonary disease) (HCC)   . Diabetes mellitus without complication (HCC)   . Hypertension   . Hypertensive heart disease   . NICM (nonischemic cardiomyopathy) (HCC)    a. 03/2014 Echo: EF 45%, glob HK, mild conc LVH; b. 03/2014 MV: possible mild ischemia, superimposed on small inf infarct, EF 36%; c. 03/2016 MV: EF <30%, no ischemia; d. 03/2016 Echo: EF  20-25%, diff HK, Gr1 DD, mild Jorge.  . Polysubstance abuse (HCC)   . Tobacco abuse    Past Surgical History:  Procedure Laterality Date  . RIGHT/LEFT HEART CATH AND CORONARY ANGIOGRAPHY N/A 06/01/2017   Procedure: RIGHT/LEFT HEART CATH AND CORONARY ANGIOGRAPHY;  Surgeon: Iran Ouch, MD;  Location: [facility] INVASIVE CV LAB;  Service: Cardiovascular;  Laterality: N/A;   Family History  Problem Relation Age of Onset  . Diabetes Mother   . Diabetes Father    Social History   Tobacco Use  . Smoking status: Current Every Day Smoker    Packs/day: 1.00    Years: 50.00    Pack years: 50.00  . Smokeless tobacco: Never Used  Substance Use Topics  . Alcohol use: No   No Known Allergies   Review of Systems  Constitutional: Positive for appetite change and fatigue (fluctuates).  HENT: Positive for rhinorrhea. Negative for congestion and sore throat.   Eyes: Negative.   Respiratory: Positive for cough (dry cough) and shortness of breath (sometimes). Negative for chest tightness.   Cardiovascular: Positive for chest pain ("nagging"). Negative for palpitations and leg swelling.  Gastrointestinal: Negative for abdominal distention and abdominal pain.  Endocrine: Negative.   Genitourinary: Negative.   Musculoskeletal: Negative for back pain and neck pain.  Skin: Negative.   Allergic/Immunologic: Negative.   Neurological: Positive for light-headedness (at times). Negative for dizziness.  Hematological: Negative for adenopathy. Does not bruise/bleed easily.  Psychiatric/Behavioral: Negative for dysphoric mood  and sleep disturbance (sleeping on 2 pillows).   Lab Results  Component Value Date   CREATININE 1.18 06/01/2017   CREATININE 1.24 05/30/2017   CREATININE 1.12 05/03/2016   Physical Exam  Constitutional: He is oriented to person, place, and time. He appears well-developed and well-nourished.  HENT:  Head: Normocephalic and atraumatic.  Neck: Normal range of motion. Neck supple.  No JVD present.  Cardiovascular: Normal rate and regular rhythm.  Pulmonary/Chest: Effort normal. He has no wheezes. He has no rales.  Abdominal: Soft. He exhibits no distension. There is no tenderness.  Musculoskeletal: He exhibits no edema or tenderness.  Neurological: He is alert and oriented to person, place, and time.  Skin: Skin is warm and dry.  Psychiatric: He has a normal mood and affect. His behavior is normal. Thought content normal.  Nursing note and vitals reviewed.   Assessment & Plan:  1: Chronic heart failure with reduced ejection fraction- - NYHA class II - euvolemic today - already weighing daily. Instructed to call for an overnight weight gain of >2 pounds or a weekly weight gain of >5 pounds - not adding salt and has been reading food labels. Discussed the importance of keeping sodium intake to 2000mg  daily. Written dietary information was given to him about this - says that he's not drinking "a lot". Discussed keeping his daily fluid intake to between 40-60 ounces of fluid daily - saw cardiologist (Jorge Cruz) 06/05/17 - PharmD went in and reviewed medications although patient did not have a list and couldn't recall clearly what he was taking - consider changing his losartan to entresto in the future  2: HTN- - BP looks good today - BMP from 06/01/17 reviewed and shows sodium 139, potassium 3.5 and GFR >60  3: DM- - glucose in clinic was 142 nonfasting - A1c on 05/31/17 was 7.6%, down from 9.9% back in August 2017  4:Tobacco use-  - smokes ~ 1 pack of cigarettes daily - complete cessation discussed for 3 minutes with the patient  Patient did not bring his medications nor a list.   Patient's wife called back after they left and verbally went over all his medications. Has not been taking carvedilol as he thought he was told to stop it. Will resume it at 3.125mg  BID with the goal to titrate it up. Emphasized the importance of bringing his medication bottles to every  visit every time.  Return in 2 weeks or sooner for any questions/problems before then.

## 2017-07-26 ENCOUNTER — Ambulatory Visit: Payer: Medicare Other | Attending: Family | Admitting: Family

## 2017-07-26 ENCOUNTER — Other Ambulatory Visit: Payer: Self-pay

## 2017-07-26 ENCOUNTER — Encounter: Payer: Self-pay | Admitting: Family

## 2017-07-26 VITALS — BP 137/70 | HR 80 | Resp 18 | Ht 68.0 in | Wt 222.2 lb

## 2017-07-26 DIAGNOSIS — Z7982 Long term (current) use of aspirin: Secondary | ICD-10-CM | POA: Insufficient documentation

## 2017-07-26 DIAGNOSIS — Z79899 Other long term (current) drug therapy: Secondary | ICD-10-CM | POA: Diagnosis not present

## 2017-07-26 DIAGNOSIS — F17210 Nicotine dependence, cigarettes, uncomplicated: Secondary | ICD-10-CM | POA: Insufficient documentation

## 2017-07-26 DIAGNOSIS — Z794 Long term (current) use of insulin: Secondary | ICD-10-CM | POA: Insufficient documentation

## 2017-07-26 DIAGNOSIS — I5022 Chronic systolic (congestive) heart failure: Secondary | ICD-10-CM | POA: Insufficient documentation

## 2017-07-26 DIAGNOSIS — Z720 Tobacco use: Secondary | ICD-10-CM

## 2017-07-26 DIAGNOSIS — J449 Chronic obstructive pulmonary disease, unspecified: Secondary | ICD-10-CM | POA: Diagnosis not present

## 2017-07-26 DIAGNOSIS — I10 Essential (primary) hypertension: Secondary | ICD-10-CM

## 2017-07-26 DIAGNOSIS — E119 Type 2 diabetes mellitus without complications: Secondary | ICD-10-CM | POA: Insufficient documentation

## 2017-07-26 DIAGNOSIS — I429 Cardiomyopathy, unspecified: Secondary | ICD-10-CM | POA: Insufficient documentation

## 2017-07-26 DIAGNOSIS — I110 Hypertensive heart disease with heart failure: Secondary | ICD-10-CM | POA: Insufficient documentation

## 2017-07-26 MED ORDER — SACUBITRIL-VALSARTAN 24-26 MG PO TABS: 1.0000 | ORAL_TABLET | Freq: Two times a day (BID) | ORAL | 3 refills | Status: DC

## 2017-07-26 MED ORDER — POTASSIUM CHLORIDE CRYS ER 20 MEQ PO TBCR: 20.0000 meq | EXTENDED_RELEASE_TABLET | Freq: Every day | ORAL | 5 refills | Status: DC

## 2017-07-26 NOTE — Patient Instructions (Addendum)
Continue weighing daily and call for an overnight weight gain of > 2 pounds or a weekly weight gain of >5 pounds.  Finish out the losartan and then begin entresto twice daily.

## 2017-07-26 NOTE — Progress Notes (Signed)
Patient ID: Jorge Cruz, male    DOB: 10/02/1950, 66 y.o.   MRN: [id number]  HPI  Jorge Cruz is a [age] y/o male with a history of DM, COPD, HTN, current tobacco use and chronic heart failure.  Echo report from 04/01/16 was reviewed and shows an EF of 20-25% along with mild Jorge. Cardiac catheterization was done 06/01/17 which showed an EF of 20% along with mild nonobstructive CAD. RHC also done which showed normal filling pressures, normal pulmonary pressure and moderately reduced cardiac output. Cardiac output was 3.82 with a cardiac index of 1.83.  Admitted 05/30/17 due to chest pain and HF exacerbation. NSTEMI was ruled out. Elevated troponins thought to be due to demand ischemia. Cardiology consult was obtained. LHC/RHC was done. Discharged home after 2 days. Was in the ED 05/15/17 due to shortness of breath. Treated and released. Was in the ED 03/19/17 due to a cough. Treated and released.   He presents today for a follow-up visit with a chief complaint of minimal shortness of breath upon moderate exertion. He describes this as chronic in nature having been present for several years. He has associated fatigue and occasional chest pain. He denies any chest tightness, edema, palpitations, dizziness, weight gain or difficulty sleeping.   Past Medical History:  Diagnosis Date  . Chronic systolic CHF (congestive heart failure) (HCC)    a. 03/2014 Echo: EF 45%, glob HK; b. 03/2016 Echo: EF 20-25%, diff HK, Gr1 DD, mild Jorge.  Marland Kitchen. COPD (chronic obstructive pulmonary disease) (HCC)   . Diabetes mellitus without complication (HCC)   . Hypertension   . Hypertensive heart disease   . NICM (nonischemic cardiomyopathy) (HCC)    a. 03/2014 Echo: EF 45%, glob HK, mild conc LVH; b. 03/2014 MV: possible mild ischemia, superimposed on small inf infarct, EF 36%; c. 03/2016 MV: EF <30%, no ischemia; d. 03/2016 Echo: EF 20-25%, diff HK, Gr1 DD, mild Jorge.  . Polysubstance abuse (HCC)   . Tobacco abuse    Past Surgical History:   Procedure Laterality Date  . RIGHT/LEFT HEART CATH AND CORONARY ANGIOGRAPHY N/A 06/01/2017   Procedure: RIGHT/LEFT HEART CATH AND CORONARY ANGIOGRAPHY;  Surgeon: Iran OuchArida, Muhammad A, MD;  Location: [facility] INVASIVE CV LAB;  Service: Cardiovascular;  Laterality: N/A;   Family History  Problem Relation Age of Onset  . Diabetes Mother   . Diabetes Father    Social History   Tobacco Use  . Smoking status: Current Every Day Smoker    Packs/day: 1.00    Years: 50.00    Pack years: 50.00  . Smokeless tobacco: Never Used  Substance Use Topics  . Alcohol use: No   No Known Allergies  Prior to Admission medications   Medication Sig Start Date End Date Taking? Authorizing Provider  albuterol (PROVENTIL HFA;VENTOLIN HFA) 108 (90 Base) MCG/ACT inhaler Inhale 2 puffs into the lungs every 6 (six) hours as needed for wheezing or shortness of breath.   Yes [provider]  aspirin EC 81 MG tablet Take 1 tablet (81 mg total) by mouth daily. 04/01/16  Yes Enedina FinnerPatel, Sona, MD  atorvastatin (LIPITOR) 80 MG tablet Take 80 mg by mouth daily.   Yes [provider]  carvedilol (COREG) 3.125 MG tablet Take 1 tablet (3.125 mg total) by mouth 2 (two) times daily. 06/07/17 09/05/17 Yes Hackney, Inetta Fermoina A, FNP  furosemide (LASIX) 20 MG tablet Take 1 tablet (20 mg total) by mouth daily. 05/03/16 05/30/18 Yes End, Cristal Deerhristopher, MD  insulin detemir (LEVEMIR) 100  UNIT/ML injection Inject 50 Units into the skin at bedtime.   Yes [provider]  losartan (COZAAR) 25 MG tablet Take 1 tablet (25 mg total) by mouth daily. 06/02/17  Yes Altamese Dilling, MD  metFORMIN (GLUCOPHAGE) 1000 MG tablet Take 1 tablet by mouth 2 (two) times daily.   Yes [provider]  nitroGLYCERIN (NITROSTAT) 0.4 MG SL tablet Place 0.4 mg under the tongue every 5 (five) minutes as needed for chest pain.   Yes [provider]  potassium chloride SA (K-DUR,KLOR-CON) 20 MEQ tablet Take 1 tablet (20 mEq total) by  mouth daily. 07/26/17  Yes Delma Freeze, FNP    Review of Systems  Constitutional: Positive for fatigue (fluctuates). Negative for appetite change.  HENT: Positive for rhinorrhea. Negative for congestion and sore throat.   Eyes: Negative.   Respiratory: Positive for shortness of breath (sometimes). Negative for cough and chest tightness.   Cardiovascular: Positive for chest pain (occasionally on right side of chest). Negative for palpitations and leg swelling.  Gastrointestinal: Negative for abdominal distention and abdominal pain.  Endocrine: Negative.   Genitourinary: Negative.   Musculoskeletal: Negative for back pain and neck pain.  Skin: Negative.   Allergic/Immunologic: Negative.   Neurological: Negative for dizziness and light-headedness.  Hematological: Negative for adenopathy. Does not bruise/bleed easily.  Psychiatric/Behavioral: Negative for dysphoric mood and sleep disturbance (sleeping on 2 pillows).   Vitals:   07/26/17 1406  BP: 137/70  Pulse: 80  Resp: 18  SpO2: 100%  Weight: 222 lb 4 oz (100.8 kg)  Height: 5\' 8"  (1.727 m)   Wt Readings from Last 3 Encounters:  07/26/17 222 lb 4 oz (100.8 kg)  06/07/17 219 lb (99.3 kg)  06/01/17 209 lb 14.4 oz (95.2 kg)    Lab Results  Component Value Date   CREATININE 1.18 06/01/2017   CREATININE 1.24 05/30/2017   CREATININE 1.12 05/03/2016   Physical Exam  Constitutional: He is oriented to person, place, and time. He appears well-developed and well-nourished.  HENT:  Head: Normocephalic and atraumatic.  Neck: Normal range of motion. Neck supple. No JVD present.  Cardiovascular: Normal rate and regular rhythm.  Pulmonary/Chest: Effort normal. He has no wheezes. He has no rales.  Abdominal: Soft. He exhibits no distension. There is no tenderness.  Musculoskeletal: He exhibits no edema or tenderness.  Neurological: He is alert and oriented to person, place, and time.  Skin: Skin is warm and dry.  Psychiatric: He has  a normal mood and affect. His behavior is normal. Thought content normal.  Nursing note and vitals reviewed.   Assessment & Plan:  1: Chronic heart failure with reduced ejection fraction- - NYHA class II - euvolemic today - already weighing daily. Reminded  to call for an overnight weight gain of >2 pounds or a weekly weight gain of >5 pounds - not adding salt and has been reading food labels. Reviewed the importance of keeping sodium intake to 2000mg  daily.  - says that he's not drinking "a lot". Discussed keeping his daily fluid intake to between 40-60 ounces of fluid daily - saw cardiologist (Sivak) 06/05/17 - patient reports receiving his flu vaccine for the season  - patient to finish taking his losartan and then will begin entresto 24/26mg  twice daily - will check a BMP at his next visit  2: HTN- - BP looks good today - BMP from 07/20/17 reviewed and shows sodium 137, potassium 4.8 and GFR >60  3: DM- - glucose in clinic was  219 last night - A1c on 05/31/17 was 7.6%, down from 9.9% back in August 2017  4:Tobacco use-  - smokes ~ 1 pack of cigarettes daily - complete cessation discussed for 3 minutes with the patient  Patient did not bring his medications nor a list.   Return in 6 weeks or sooner for any questions/problems before then.

## 2017-07-27 NOTE — Addendum Note (Signed)
Addended by: Clarisa Kindred A on: 07/27/2017 02:54 PM   Modules accepted: Orders

## 2017-09-04 NOTE — Progress Notes (Deleted)
Patient ID: Jorge Cruz, male    DOB: Sep 27, 1950, 67 y.o.   MRN: [id number]  HPI  Jorge Cruz is a 67 y/o male with a history of DM, COPD, HTN, current tobacco use and chronic heart failure.  Echo report from 04/01/16 was reviewed and shows an EF of 20-25% along with mild Jorge. Cardiac catheterization was done 06/01/17 which showed an EF of 20% along with mild nonobstructive CAD. RHC also done which showed normal filling pressures, normal pulmonary pressure and moderately reduced cardiac output. Cardiac output was 3.82 with a cardiac index of 1.83.  Admitted 05/30/17 due to chest pain and HF exacerbation. NSTEMI was ruled out. Elevated troponins thought to be due to demand ischemia. Cardiology consult was obtained. LHC/RHC was done. Discharged home after 2 days. Was in the ED 05/15/17 due to shortness of breath. Treated and released. Was in the ED 03/19/17 due to a cough. Treated and released.   He presents today for a follow-up visit with a chief complaint of    Past Medical History:  Diagnosis Date  . Chronic systolic CHF (congestive heart failure) (HCC)    a. 03/2014 Echo: EF 45%, glob HK; b. 03/2016 Echo: EF 20-25%, diff HK, Gr1 DD, mild Jorge.  Marland Kitchen COPD (chronic obstructive pulmonary disease) (HCC)   . Diabetes mellitus without complication (HCC)   . Hypertension   . Hypertensive heart disease   . NICM (nonischemic cardiomyopathy) (HCC)    a. 03/2014 Echo: EF 45%, glob HK, mild conc LVH; b. 03/2014 MV: possible mild ischemia, superimposed on small inf infarct, EF 36%; c. 03/2016 MV: EF <30%, no ischemia; d. 03/2016 Echo: EF 20-25%, diff HK, Gr1 DD, mild Jorge.  . Polysubstance abuse (HCC)   . Tobacco abuse    Past Surgical History:  Procedure Laterality Date  . RIGHT/LEFT HEART CATH AND CORONARY ANGIOGRAPHY N/A 06/01/2017   Procedure: RIGHT/LEFT HEART CATH AND CORONARY ANGIOGRAPHY;  Surgeon: Jorge Ouch, MD;  Location: [facility] INVASIVE CV LAB;  Service: Cardiovascular;  Laterality: N/A;   Family  History  Problem Relation Age of Onset  . Diabetes Mother   . Diabetes Father    Social History   Tobacco Use  . Smoking status: Current Every Day Smoker    Packs/day: 1.00    Years: 50.00    Pack years: 50.00  . Smokeless tobacco: Never Used  Substance Use Topics  . Alcohol use: No   No Known Allergies    Review of Systems  Constitutional: Positive for fatigue (fluctuates). Negative for appetite change.  HENT: Positive for rhinorrhea. Negative for congestion and sore throat.   Eyes: Negative.   Respiratory: Positive for shortness of breath (sometimes). Negative for cough and chest tightness.   Cardiovascular: Positive for chest pain (occasionally on right side of chest). Negative for palpitations and leg swelling.  Gastrointestinal: Negative for abdominal distention and abdominal pain.  Endocrine: Negative.   Genitourinary: Negative.   Musculoskeletal: Negative for back pain and neck pain.  Skin: Negative.   Allergic/Immunologic: Negative.   Neurological: Negative for dizziness and light-headedness.  Hematological: Negative for adenopathy. Does not bruise/bleed easily.  Psychiatric/Behavioral: Negative for dysphoric mood and sleep disturbance (sleeping on 2 pillows).   Physical Exam  Constitutional: He is oriented to person, place, and time. He appears well-developed and well-nourished.  HENT:  Head: Normocephalic and atraumatic.  Neck: Normal range of motion. Neck supple. No JVD present.  Cardiovascular: Normal rate and regular rhythm.  Pulmonary/Chest: Effort normal. He has no wheezes.  He has no rales.  Abdominal: Soft. He exhibits no distension. There is no tenderness.  Musculoskeletal: He exhibits no edema or tenderness.  Neurological: He is alert and oriented to person, place, and time.  Skin: Skin is warm and dry.  Psychiatric: He has a normal mood and affect. His behavior is normal. Thought content normal.  Nursing note and vitals reviewed.   Assessment &  Plan:  1: Chronic heart failure with reduced ejection fraction- - NYHA class II - euvolemic today - already weighing daily. Reminded  to call for an overnight weight gain of >2 pounds or a weekly weight gain of >5 pounds - not adding salt and has been reading food labels. Reviewed the importance of keeping sodium intake to 2000mg  daily.  - says that he's not drinking "a lot". Discussed keeping his daily fluid intake to between 40-60 ounces of fluid daily - saw cardiologist (Jorge Cruz) 06/05/17 - patient reports receiving his flu vaccine for the season  - patient to finish taking his losartan and then will begin entresto 24/26mg  twice daily - will check a BMP today  2: HTN- - BP looks good today - BMP from 07/20/17 reviewed and shows sodium 137, potassium 4.8 and GFR >60  3: DM- - glucose in clinic was  - A1c on 05/31/17 was 7.6%, down from 9.9% back in August 2017  4:Tobacco use-  - smokes ~ 1 pack of cigarettes daily - complete cessation discussed for 3 minutes with the patient  Patient did not bring his medications nor a list.

## 2017-09-06 ENCOUNTER — Ambulatory Visit: Payer: Medicare Other | Admitting: Family

## 2017-09-13 ENCOUNTER — Ambulatory Visit: Payer: Medicare Other | Attending: Family | Admitting: Family

## 2017-09-13 ENCOUNTER — Encounter: Payer: Self-pay | Admitting: Family

## 2017-09-13 VITALS — BP 117/55 | HR 84 | Resp 18 | Ht 67.0 in | Wt 224.4 lb

## 2017-09-13 DIAGNOSIS — I10 Essential (primary) hypertension: Secondary | ICD-10-CM

## 2017-09-13 DIAGNOSIS — I2510 Atherosclerotic heart disease of native coronary artery without angina pectoris: Secondary | ICD-10-CM | POA: Diagnosis not present

## 2017-09-13 DIAGNOSIS — Z720 Tobacco use: Secondary | ICD-10-CM

## 2017-09-13 DIAGNOSIS — Z79899 Other long term (current) drug therapy: Secondary | ICD-10-CM | POA: Insufficient documentation

## 2017-09-13 DIAGNOSIS — I110 Hypertensive heart disease with heart failure: Secondary | ICD-10-CM | POA: Insufficient documentation

## 2017-09-13 DIAGNOSIS — Z7982 Long term (current) use of aspirin: Secondary | ICD-10-CM | POA: Diagnosis not present

## 2017-09-13 DIAGNOSIS — F17210 Nicotine dependence, cigarettes, uncomplicated: Secondary | ICD-10-CM | POA: Diagnosis not present

## 2017-09-13 DIAGNOSIS — Z794 Long term (current) use of insulin: Secondary | ICD-10-CM | POA: Insufficient documentation

## 2017-09-13 DIAGNOSIS — E119 Type 2 diabetes mellitus without complications: Secondary | ICD-10-CM | POA: Diagnosis not present

## 2017-09-13 DIAGNOSIS — J449 Chronic obstructive pulmonary disease, unspecified: Secondary | ICD-10-CM | POA: Insufficient documentation

## 2017-09-13 DIAGNOSIS — Z833 Family history of diabetes mellitus: Secondary | ICD-10-CM | POA: Diagnosis not present

## 2017-09-13 DIAGNOSIS — I5022 Chronic systolic (congestive) heart failure: Secondary | ICD-10-CM | POA: Insufficient documentation

## 2017-09-13 DIAGNOSIS — R079 Chest pain, unspecified: Secondary | ICD-10-CM | POA: Diagnosis not present

## 2017-09-13 DIAGNOSIS — Z9889 Other specified postprocedural states: Secondary | ICD-10-CM | POA: Insufficient documentation

## 2017-09-13 NOTE — Patient Instructions (Addendum)
Continue weighing daily and call for an overnight weight gain of > 2 pounds or a weekly weight gain of >5 pounds.  Take entresto and carvedilol twice daily.

## 2017-09-13 NOTE — Progress Notes (Signed)
Patient ID: Jorge Cruz, male    DOB: 1951-01-20, 67 y.o.   MRN: [id number]  HPI  Mr Bulkley is a 67 y/o male with a history of DM, COPD, HTN, current tobacco use and chronic heart failure.  Echo report from 04/01/16 was reviewed and shows an EF of 20-25% along with mild MR. Cardiac catheterization was done 06/01/17 which showed an EF of 20% along with mild nonobstructive CAD. RHC also done which showed normal filling pressures, normal pulmonary pressure and moderately reduced cardiac output. Cardiac output was 3.82 with a cardiac index of 1.83.  Admitted 05/30/17 due to chest pain and HF exacerbation. NSTEMI was ruled out. Elevated troponins thought to be due to demand ischemia. Cardiology consult was obtained. LHC/RHC was done. Discharged home after 2 days. Was in the ED 05/15/17 due to shortness of breath. Treated and released. Was in the ED 03/19/17 due to a cough. Treated and released.   He presents today for a follow-up visit with a chief complaint of rhinorrhea. He says that he's dealt with this for many years and it tends to occur in weather changes. He denies any fatigue, chest pain, shortness of breath, edema, palpitations, dizziness, difficulty sleeping or weight gain.   Past Medical History:  Diagnosis Date  . Chronic systolic CHF (congestive heart failure) (HCC)    a. 03/2014 Echo: EF 45%, glob HK; b. 03/2016 Echo: EF 20-25%, diff HK, Gr1 DD, mild MR.  Marland Kitchen COPD (chronic obstructive pulmonary disease) (HCC)   . Diabetes mellitus without complication (HCC)   . Hypertension   . Hypertensive heart disease   . NICM (nonischemic cardiomyopathy) (HCC)    a. 03/2014 Echo: EF 45%, glob HK, mild conc LVH; b. 03/2014 MV: possible mild ischemia, superimposed on small inf infarct, EF 36%; c. 03/2016 MV: EF <30%, no ischemia; d. 03/2016 Echo: EF 20-25%, diff HK, Gr1 DD, mild MR.  . Polysubstance abuse (HCC)   . Tobacco abuse    Past Surgical History:  Procedure Laterality Date  . RIGHT/LEFT HEART CATH  AND CORONARY ANGIOGRAPHY N/A 06/01/2017   Procedure: RIGHT/LEFT HEART CATH AND CORONARY ANGIOGRAPHY;  Surgeon: Iran Ouch, MD;  Location: [facility] INVASIVE CV LAB;  Service: Cardiovascular;  Laterality: N/A;   Family History  Problem Relation Age of Onset  . Diabetes Mother   . Diabetes Father    Social History   Tobacco Use  . Smoking status: Current Every Day Smoker    Packs/day: 1.00    Years: 50.00    Pack years: 50.00  . Smokeless tobacco: Never Used  Substance Use Topics  . Alcohol use: No   No Known Allergies  Prior to Admission medications   Medication Sig Start Date End Date Taking? Authorizing Provider  albuterol (PROVENTIL HFA;VENTOLIN HFA) 108 (90 Base) MCG/ACT inhaler Inhale 2 puffs into the lungs every 6 (six) hours as needed for wheezing or shortness of breath.   Yes [provider]  aspirin EC 81 MG tablet Take 1 tablet (81 mg total) by mouth daily. 04/01/16  Yes Enedina Finner, MD  atorvastatin (LIPITOR) 80 MG tablet Take 80 mg by mouth daily.   Yes [provider]  carvedilol (COREG) 3.125 MG tablet Take 3.125 mg by mouth 2 (two) times daily with a meal.   Yes [provider]  furosemide (LASIX) 20 MG tablet Take 1 tablet (20 mg total) by mouth daily. Patient taking differently: Take 20 mg by mouth daily. Pt sometimes takes twice a day if weight  is up 05/03/16 05/30/18 Yes End, [name], MD  insulin detemir (LEVEMIR) 100 UNIT/ML injection Inject 50 Units into the skin at bedtime.   Yes [provider]  metFORMIN (GLUCOPHAGE) 1000 MG tablet Take 1 tablet by mouth 2 (two) times daily.   Yes [provider]  nitroGLYCERIN (NITROSTAT) 0.4 MG SL tablet Place 0.4 mg under the tongue every 5 (five) minutes as needed for chest pain.   Yes [provider]  potassium chloride SA (K-DUR,KLOR-CON) 20 MEQ tablet Take 1 tablet (20 mEq total) by mouth daily. 07/26/17  Yes Purcell Jungbluth A, FNP  sacubitril-valsartan (ENTRESTO)  24-26 MG Take 1 tablet by mouth 2 (two) times daily. Patient taking differently: Take 1 tablet by mouth 2 (two) times daily. Pt has been taking once a day 07/26/17  Yes Delma Freeze, FNP   Review of Systems  Constitutional: Negative for appetite change and fatigue.  HENT: Positive for rhinorrhea. Negative for congestion and sore throat.   Eyes: Negative.   Respiratory: Negative for cough, chest tightness and shortness of breath.   Cardiovascular: Negative for chest pain, palpitations and leg swelling.  Gastrointestinal: Negative for abdominal distention and abdominal pain.  Endocrine: Negative.   Genitourinary: Negative.   Musculoskeletal: Negative for back pain and neck pain.  Skin: Negative.   Allergic/Immunologic: Negative.   Neurological: Negative for dizziness and light-headedness.  Hematological: Negative for adenopathy. Does not bruise/bleed easily.  Psychiatric/Behavioral: Negative for dysphoric mood and sleep disturbance (sleeping on 2 pillows). The patient is not nervous/anxious.    Vitals:   09/13/17 1057  BP: (!) 117/55  Pulse: 84  Resp: 18  SpO2: 97%  Weight: 224 lb 6 oz (101.8 kg)  Height: 5\' 7"  (1.702 m)   Wt Readings from Last 3 Encounters:  09/13/17 224 lb 6 oz (101.8 kg)  07/26/17 222 lb 4 oz (100.8 kg)  06/07/17 219 lb (99.3 kg)   Lab Results  Component Value Date   CREATININE 1.18 06/01/2017   CREATININE 1.24 05/30/2017   CREATININE 1.12 05/03/2016    Physical Exam  Constitutional: He is oriented to person, place, and time. He appears well-developed and well-nourished.  HENT:  Head: Normocephalic and atraumatic.  Neck: Normal range of motion. Neck supple. No JVD present.  Cardiovascular: Normal rate and regular rhythm.  Pulmonary/Chest: Effort normal. He has no wheezes. He has no rales.  Abdominal: Soft. He exhibits no distension. There is no tenderness.  Musculoskeletal: He exhibits no edema or tenderness.  Neurological: He is alert and oriented  to person, place, and time.  Skin: Skin is warm and dry.  Psychiatric: He has a normal mood and affect. His behavior is normal. Thought content normal.  Nursing note and vitals reviewed.   Assessment & Plan:  1: Chronic heart failure with reduced ejection fraction- - NYHA class I - euvolemic today - weighing daily. Reminded  to call for an overnight weight gain of >2 pounds or a weekly weight gain of >5 pounds - weight stable since the last time he was here - not adding salt and has been reading food labels. Reviewed the importance of keeping sodium intake to 2000mg  daily.  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 Has to walk up 30 steps numerous times a night & can do so without difficulty  2: HTN- - BP looks good today - BMP from 07/20/17 reviewed and shows sodium 137, potassium 4.8 and GFR >60 - says that he saw his PCP at [facility] ~ 1 week ago  3: DM- - did not check his glucose yesterday or today - A1c on 05/31/17 was 7.6%, down from 9.9% back in August 2017  4:Tobacco use-  - smokes ~ 1 pack of cigarettes daily - complete cessation discussed for 3 minutes with the patient  Patient did not bring his medications nor a list. Each medication was verbally reviewed with the patient and he was encouraged to bring the bottles to every visit to confirm accuracy of list.  Return in 6 months or sooner for any questions/problems before then.

## 2017-11-24 ENCOUNTER — Emergency Department: Payer: Medicare Other

## 2017-11-24 ENCOUNTER — Other Ambulatory Visit: Payer: Self-pay

## 2017-11-24 ENCOUNTER — Inpatient Hospital Stay: Payer: Medicare Other

## 2017-11-24 ENCOUNTER — Encounter: Payer: Self-pay | Admitting: *Deleted

## 2017-11-24 ENCOUNTER — Inpatient Hospital Stay
Admission: EM | Admit: 2017-11-24 | Discharge: 2017-12-01 | DRG: 207 | Disposition: A | Discharge status: Home Health | Payer: Medicare Other | Attending: Internal Medicine | Admitting: Internal Medicine

## 2017-11-24 DIAGNOSIS — I428 Other cardiomyopathies: Secondary | ICD-10-CM | POA: Diagnosis not present

## 2017-11-24 DIAGNOSIS — I5023 Acute on chronic systolic (congestive) heart failure: Secondary | ICD-10-CM | POA: Diagnosis not present

## 2017-11-24 DIAGNOSIS — F17210 Nicotine dependence, cigarettes, uncomplicated: Secondary | ICD-10-CM | POA: Diagnosis present

## 2017-11-24 DIAGNOSIS — F17200 Nicotine dependence, unspecified, uncomplicated: Secondary | ICD-10-CM | POA: Diagnosis not present

## 2017-11-24 DIAGNOSIS — J9621 Acute and chronic respiratory failure with hypoxia: Secondary | ICD-10-CM

## 2017-11-24 DIAGNOSIS — E1165 Type 2 diabetes mellitus with hyperglycemia: Secondary | ICD-10-CM | POA: Diagnosis present

## 2017-11-24 DIAGNOSIS — I248 Other forms of acute ischemic heart disease: Secondary | ICD-10-CM | POA: Diagnosis present

## 2017-11-24 DIAGNOSIS — J810 Acute pulmonary edema: Secondary | ICD-10-CM | POA: Diagnosis not present

## 2017-11-24 DIAGNOSIS — Z9911 Dependence on respirator [ventilator] status: Secondary | ICD-10-CM

## 2017-11-24 DIAGNOSIS — E1122 Type 2 diabetes mellitus with diabetic chronic kidney disease: Secondary | ICD-10-CM | POA: Diagnosis present

## 2017-11-24 DIAGNOSIS — I130 Hypertensive heart and chronic kidney disease with heart failure and stage 1 through stage 4 chronic kidney disease, or unspecified chronic kidney disease: Secondary | ICD-10-CM | POA: Diagnosis present

## 2017-11-24 DIAGNOSIS — I5042 Chronic combined systolic (congestive) and diastolic (congestive) heart failure: Secondary | ICD-10-CM | POA: Diagnosis not present

## 2017-11-24 DIAGNOSIS — E119 Type 2 diabetes mellitus without complications: Secondary | ICD-10-CM | POA: Diagnosis not present

## 2017-11-24 DIAGNOSIS — J440 Chronic obstructive pulmonary disease with acute lower respiratory infection: Secondary | ICD-10-CM | POA: Diagnosis present

## 2017-11-24 DIAGNOSIS — J9600 Acute respiratory failure, unspecified whether with hypoxia or hypercapnia: Secondary | ICD-10-CM

## 2017-11-24 DIAGNOSIS — N183 Chronic kidney disease, stage 3 (moderate): Secondary | ICD-10-CM | POA: Diagnosis present

## 2017-11-24 DIAGNOSIS — I2510 Atherosclerotic heart disease of native coronary artery without angina pectoris: Secondary | ICD-10-CM | POA: Diagnosis present

## 2017-11-24 DIAGNOSIS — Z9119 Patient's noncompliance with other medical treatment and regimen: Secondary | ICD-10-CM | POA: Diagnosis not present

## 2017-11-24 DIAGNOSIS — Z794 Long term (current) use of insulin: Secondary | ICD-10-CM | POA: Diagnosis not present

## 2017-11-24 DIAGNOSIS — J449 Chronic obstructive pulmonary disease, unspecified: Secondary | ICD-10-CM | POA: Diagnosis not present

## 2017-11-24 DIAGNOSIS — Z833 Family history of diabetes mellitus: Secondary | ICD-10-CM | POA: Diagnosis not present

## 2017-11-24 DIAGNOSIS — I5043 Acute on chronic combined systolic (congestive) and diastolic (congestive) heart failure: Secondary | ICD-10-CM | POA: Diagnosis not present

## 2017-11-24 DIAGNOSIS — R0603 Acute respiratory distress: Secondary | ICD-10-CM | POA: Diagnosis not present

## 2017-11-24 DIAGNOSIS — E872 Acidosis: Secondary | ICD-10-CM | POA: Diagnosis present

## 2017-11-24 DIAGNOSIS — R0602 Shortness of breath: Secondary | ICD-10-CM | POA: Diagnosis not present

## 2017-11-24 DIAGNOSIS — J9690 Respiratory failure, unspecified, unspecified whether with hypoxia or hypercapnia: Secondary | ICD-10-CM

## 2017-11-24 DIAGNOSIS — I5022 Chronic systolic (congestive) heart failure: Secondary | ICD-10-CM

## 2017-11-24 DIAGNOSIS — J15211 Pneumonia due to Methicillin susceptible Staphylococcus aureus: Secondary | ICD-10-CM | POA: Diagnosis present

## 2017-11-24 DIAGNOSIS — J189 Pneumonia, unspecified organism: Secondary | ICD-10-CM | POA: Diagnosis not present

## 2017-11-24 DIAGNOSIS — Z7982 Long term (current) use of aspirin: Secondary | ICD-10-CM | POA: Diagnosis not present

## 2017-11-24 DIAGNOSIS — I252 Old myocardial infarction: Secondary | ICD-10-CM | POA: Diagnosis not present

## 2017-11-24 DIAGNOSIS — Z4659 Encounter for fitting and adjustment of other gastrointestinal appliance and device: Secondary | ICD-10-CM

## 2017-11-24 DIAGNOSIS — J9601 Acute respiratory failure with hypoxia: Secondary | ICD-10-CM | POA: Diagnosis not present

## 2017-11-24 DIAGNOSIS — E785 Hyperlipidemia, unspecified: Secondary | ICD-10-CM | POA: Diagnosis present

## 2017-11-24 DIAGNOSIS — R748 Abnormal levels of other serum enzymes: Secondary | ICD-10-CM | POA: Diagnosis not present

## 2017-11-24 DIAGNOSIS — J9602 Acute respiratory failure with hypercapnia: Secondary | ICD-10-CM | POA: Diagnosis present

## 2017-11-24 DIAGNOSIS — J441 Chronic obstructive pulmonary disease with (acute) exacerbation: Secondary | ICD-10-CM | POA: Diagnosis not present

## 2017-11-24 DIAGNOSIS — R9431 Abnormal electrocardiogram [ECG] [EKG]: Secondary | ICD-10-CM | POA: Diagnosis not present

## 2017-11-24 DIAGNOSIS — J811 Chronic pulmonary edema: Secondary | ICD-10-CM

## 2017-11-24 HISTORY — DX: Chronic kidney disease, stage 3 unspecified: N18.30

## 2017-11-24 HISTORY — DX: Atherosclerotic heart disease of native coronary artery without angina pectoris: I25.10

## 2017-11-24 HISTORY — DX: Chronic kidney disease, stage 3 (moderate): N18.3

## 2017-11-24 HISTORY — DX: Chronic combined systolic (congestive) and diastolic (congestive) heart failure: I50.42

## 2017-11-24 LAB — GLUCOSE, CAPILLARY
Glucose-Capillary: 243 mg/dL — ABNORMAL HIGH (ref 65–99)
Glucose-Capillary: 231 mg/dL — ABNORMAL HIGH (ref 65–99)
Glucose-Capillary: 231 mg/dL — ABNORMAL HIGH (ref 65–99)
Glucose-Capillary: 141 mg/dL — ABNORMAL HIGH (ref 65–99)
Glucose-Capillary: 133 mg/dL — ABNORMAL HIGH (ref 65–99)

## 2017-11-24 LAB — BLOOD GAS, ARTERIAL
Acid-base deficit: 5 mmol/L — ABNORMAL HIGH (ref 0.0–2.0)
Acid-base deficit: 2.7 mmol/L — ABNORMAL HIGH (ref 0.0–2.0)
Bicarbonate: 26.3 mmol/L (ref 20.0–28.0)
Bicarbonate: 24.1 mmol/L (ref 20.0–28.0)
FIO2: 0.7
FIO2: 0.8
MECHVT: 500 mL
MECHVT: 500 mL
O2 Saturation: 91.7 %
O2 Saturation: 99.8 %
PEEP: 8 cmH2O
Patient temperature: 37
Patient temperature: 36.4
RATE: 22 resp/min
RATE: 30 resp/min
pCO2 arterial: 81 mmHg (ref 32.0–48.0)
pCO2 arterial: 49 mmHg — ABNORMAL HIGH (ref 32.0–48.0)
pH, Arterial: 7.12 — CL (ref 7.350–7.450)
pH, Arterial: 7.31 — ABNORMAL LOW (ref 7.350–7.450)
pO2, Arterial: 83 mmHg (ref 83.0–108.0)
pO2, Arterial: 264 mmHg — ABNORMAL HIGH (ref 83.0–108.0)

## 2017-11-24 LAB — CBC WITH DIFFERENTIAL/PLATELET
Basophils Absolute: 0.1 10*3/uL (ref 0–0.1)
Basophils Relative: 1 %
Eosinophils Absolute: 0.1 10*3/uL (ref 0–0.7)
Eosinophils Relative: 1 %
HCT: 41.3 % (ref 40.0–52.0)
Hemoglobin: 13.5 g/dL (ref 13.0–18.0)
Lymphocytes Relative: 56 %
Lymphs Abs: 4.3 10*3/uL — ABNORMAL HIGH (ref 1.0–3.6)
MCH: 31.7 pg (ref 26.0–34.0)
MCHC: 32.6 g/dL (ref 32.0–36.0)
MCV: 97.2 fL (ref 80.0–100.0)
Monocytes Absolute: 0.6 10*3/uL (ref 0.2–1.0)
Monocytes Relative: 8 %
Neutro Abs: 2.7 10*3/uL (ref 1.4–6.5)
Neutrophils Relative %: 34 %
Platelets: 226 10*3/uL (ref 150–440)
RBC: 4.25 MIL/uL — ABNORMAL LOW (ref 4.40–5.90)
RDW: 16.7 % — ABNORMAL HIGH (ref 11.5–14.5)
WBC: 7.8 10*3/uL (ref 3.8–10.6)

## 2017-11-24 LAB — BASIC METABOLIC PANEL
Anion gap: 9 (ref 5–15)
BUN: 36 mg/dL — ABNORMAL HIGH (ref 6–20)
CO2: 25 mmol/L (ref 22–32)
Calcium: 8.7 mg/dL — ABNORMAL LOW (ref 8.9–10.3)
Chloride: 107 mmol/L (ref 101–111)
Creatinine, Ser: 1.62 mg/dL — ABNORMAL HIGH (ref 0.61–1.24)
GFR calc Af Amer: 49 mL/min — ABNORMAL LOW (ref >60)
GFR calc non Af Amer: 43 mL/min — ABNORMAL LOW (ref >60)
Glucose, Bld: 358 mg/dL — ABNORMAL HIGH (ref 65–99)
Potassium: 4.1 mmol/L (ref 3.5–5.1)
Sodium: 141 mmol/L (ref 135–145)

## 2017-11-24 LAB — TROPONIN I
Troponin I: 0.08 ng/mL (ref <0.03)
Troponin I: 0.3 ng/mL (ref <0.03)
Troponin I: 0.13 ng/mL (ref <0.03)

## 2017-11-24 LAB — TRIGLYCERIDES: Triglycerides: 160 mg/dL — ABNORMAL HIGH (ref <150)

## 2017-11-24 LAB — BRAIN NATRIURETIC PEPTIDE: B Natriuretic Peptide: 519 pg/mL — ABNORMAL HIGH (ref 0.0–100.0)

## 2017-11-24 LAB — MRSA PCR SCREENING: MRSA by PCR: NEGATIVE

## 2017-11-24 LAB — TSH: TSH: 0.94 u[IU]/mL (ref 0.350–4.500)

## 2017-11-24 MED ORDER — DOCUSATE SODIUM 50 MG/5ML PO LIQD
100.0000 mg | Freq: Two times a day (BID) | ORAL | Status: DC
  Administered 2017-11-24 – 2017-11-30 (×12): 100 mg
  Filled 2017-11-24 (×13): qty 10

## 2017-11-24 MED ORDER — FENTANYL CITRATE (PF) 100 MCG/2ML IJ SOLN
50.0000 ug | INTRAMUSCULAR | Status: DC | PRN
  Administered 2017-11-24 – 2017-11-27 (×5): 50 ug via INTRAVENOUS
  Filled 2017-11-24 (×4): qty 2

## 2017-11-24 MED ORDER — ONDANSETRON HCL 4 MG PO TABS: 4.0000 mg | ORAL_TABLET | Freq: Four times a day (QID) | ORAL | Status: DC | PRN

## 2017-11-24 MED ORDER — IPRATROPIUM-ALBUTEROL 0.5-2.5 (3) MG/3ML IN SOLN
RESPIRATORY_TRACT | Status: AC
  Filled 2017-11-24: qty 3

## 2017-11-24 MED ORDER — ETOMIDATE 2 MG/ML IV SOLN
20.0000 mg | Freq: Once | INTRAVENOUS | Status: AC
  Administered 2017-11-24: 20 mg via INTRAVENOUS

## 2017-11-24 MED ORDER — INSULIN GLARGINE 100 UNIT/ML ~~LOC~~ SOLN
30.0000 [IU] | Freq: Every day | SUBCUTANEOUS | Status: DC
  Filled 2017-11-24: qty 0.3

## 2017-11-24 MED ORDER — PROPOFOL 1000 MG/100ML IV EMUL
5.0000 ug/kg/min | Freq: Once | INTRAVENOUS | Status: AC
  Administered 2017-11-24 (×3): 20 ug/kg/min via INTRAVENOUS

## 2017-11-24 MED ORDER — INSULIN GLARGINE 100 UNIT/ML ~~LOC~~ SOLN
30.0000 [IU] | SUBCUTANEOUS | Status: DC
  Administered 2017-11-24 – 2017-11-30 (×7): 30 [IU] via SUBCUTANEOUS
  Filled 2017-11-24 (×9): qty 0.3

## 2017-11-24 MED ORDER — ONDANSETRON HCL 4 MG/2ML IJ SOLN: 4.0000 mg | Freq: Four times a day (QID) | INTRAMUSCULAR | Status: DC | PRN

## 2017-11-24 MED ORDER — ACETAMINOPHEN 325 MG PO TABS
650.0000 mg | ORAL_TABLET | Freq: Four times a day (QID) | ORAL | Status: DC | PRN
  Administered 2017-11-25 – 2017-11-26 (×3): 650 mg via ORAL
  Filled 2017-11-24 (×3): qty 2

## 2017-11-24 MED ORDER — ACETAMINOPHEN 650 MG RE SUPP: 650.0000 mg | Freq: Four times a day (QID) | RECTAL | Status: DC | PRN

## 2017-11-24 MED ORDER — INSULIN GLARGINE 100 UNIT/ML ~~LOC~~ SOLN: 30.0000 [IU] | Freq: Every day | SUBCUTANEOUS | Status: DC

## 2017-11-24 MED ORDER — INSULIN ASPART 100 UNIT/ML ~~LOC~~ SOLN
0.0000 [IU] | SUBCUTANEOUS | Status: DC
  Administered 2017-11-24: 1 [IU] via SUBCUTANEOUS
  Administered 2017-11-24: 3 [IU] via SUBCUTANEOUS
  Administered 2017-11-24: 2 [IU] via SUBCUTANEOUS
  Administered 2017-11-24: 3 [IU] via SUBCUTANEOUS
  Administered 2017-11-25: 2 [IU] via SUBCUTANEOUS
  Administered 2017-11-25: 1 [IU] via SUBCUTANEOUS
  Administered 2017-11-26: 2 [IU] via SUBCUTANEOUS
  Administered 2017-11-26 – 2017-11-27 (×2): 1 [IU] via SUBCUTANEOUS
  Administered 2017-11-27 (×2): 2 [IU] via SUBCUTANEOUS
  Administered 2017-11-27: 3 [IU] via SUBCUTANEOUS
  Administered 2017-11-28: 2 [IU] via SUBCUTANEOUS
  Administered 2017-11-28: 3 [IU] via SUBCUTANEOUS
  Administered 2017-11-28 – 2017-11-29 (×4): 1 [IU] via SUBCUTANEOUS
  Filled 2017-11-24 (×17): qty 1

## 2017-11-24 MED ORDER — FENTANYL CITRATE (PF) 100 MCG/2ML IJ SOLN
INTRAMUSCULAR | Status: AC
  Administered 2017-11-24: 50 ug via INTRAVENOUS
  Filled 2017-11-24: qty 2

## 2017-11-24 MED ORDER — FAMOTIDINE IN NACL 20-0.9 MG/50ML-% IV SOLN
20.0000 mg | INTRAVENOUS | Status: DC
  Administered 2017-11-24: 20 mg via INTRAVENOUS
  Filled 2017-11-24: qty 50

## 2017-11-24 MED ORDER — INSULIN ASPART 100 UNIT/ML ~~LOC~~ SOLN: 0.0000 [IU] | SUBCUTANEOUS | Status: DC | PRN

## 2017-11-24 MED ORDER — FUROSEMIDE 10 MG/ML IJ SOLN
40.0000 mg | Freq: Once | INTRAMUSCULAR | Status: AC
  Administered 2017-11-24: 40 mg via INTRAVENOUS

## 2017-11-24 MED ORDER — NITROGLYCERIN 2 % TD OINT
TOPICAL_OINTMENT | TRANSDERMAL | Status: AC
  Filled 2017-11-24: qty 1

## 2017-11-24 MED ORDER — INSULIN ASPART 100 UNIT/ML ~~LOC~~ SOLN: 0.0000 [IU] | Freq: Three times a day (TID) | SUBCUTANEOUS | Status: DC

## 2017-11-24 MED ORDER — PRO-STAT SUGAR FREE PO LIQD
60.0000 mL | Freq: Four times a day (QID) | ORAL | Status: DC
  Administered 2017-11-24 – 2017-11-28 (×14): 60 mL
  Administered 2017-11-28: 30 mL

## 2017-11-24 MED ORDER — ORAL CARE MOUTH RINSE
15.0000 mL | OROMUCOSAL | Status: DC
  Administered 2017-11-24 – 2017-11-28 (×40): 15 mL via OROMUCOSAL

## 2017-11-24 MED ORDER — IPRATROPIUM-ALBUTEROL 0.5-2.5 (3) MG/3ML IN SOLN
3.0000 mL | Freq: Four times a day (QID) | RESPIRATORY_TRACT | Status: DC
  Administered 2017-11-24 – 2017-11-28 (×17): 3 mL via RESPIRATORY_TRACT
  Filled 2017-11-24 (×16): qty 3

## 2017-11-24 MED ORDER — VITAL HIGH PROTEIN PO LIQD
1000.0000 mL | ORAL | Status: DC
  Administered 2017-11-24 – 2017-11-27 (×2): 1000 mL

## 2017-11-24 MED ORDER — INSULIN GLARGINE 100 UNIT/ML ~~LOC~~ SOLN: 30.0000 [IU] | SUBCUTANEOUS | Status: DC

## 2017-11-24 MED ORDER — CHLORHEXIDINE GLUCONATE 0.12% ORAL RINSE (MEDLINE KIT)
15.0000 mL | Freq: Two times a day (BID) | OROMUCOSAL | Status: DC
  Administered 2017-11-24 – 2017-11-28 (×9): 15 mL via OROMUCOSAL

## 2017-11-24 MED ORDER — ADULT MULTIVITAMIN LIQUID CH
15.0000 mL | Freq: Every day | ORAL | Status: DC
  Administered 2017-11-24 – 2017-11-27 (×3): 15 mL
  Filled 2017-11-24 (×5): qty 15

## 2017-11-24 MED ORDER — ROCURONIUM BROMIDE 50 MG/5ML IV SOLN
INTRAVENOUS | Status: AC | PRN
  Administered 2017-11-24: 50 mg via INTRAVENOUS

## 2017-11-24 MED ORDER — DOCUSATE SODIUM 100 MG PO CAPS: 100.0000 mg | ORAL_CAPSULE | Freq: Two times a day (BID) | ORAL | Status: DC

## 2017-11-24 MED ORDER — FAMOTIDINE 20 MG PO TABS
20.0000 mg | ORAL_TABLET | Freq: Every day | ORAL | Status: DC
  Administered 2017-11-25 – 2017-11-28 (×4): 20 mg
  Filled 2017-11-24 (×4): qty 1

## 2017-11-24 MED ORDER — ENOXAPARIN SODIUM 40 MG/0.4ML ~~LOC~~ SOLN
40.0000 mg | SUBCUTANEOUS | Status: DC
  Administered 2017-11-24 – 2017-11-26 (×3): 40 mg via SUBCUTANEOUS
  Filled 2017-11-24 (×3): qty 0.4

## 2017-11-24 MED ORDER — PROPOFOL 1000 MG/100ML IV EMUL
INTRAVENOUS | Status: AC
  Administered 2017-11-24: 65 ug/kg/min via INTRAVENOUS
  Filled 2017-11-24: qty 100

## 2017-11-24 MED ORDER — SUCCINYLCHOLINE CHLORIDE 20 MG/ML IJ SOLN
160.0000 mg | Freq: Once | INTRAMUSCULAR | Status: AC
  Administered 2017-11-24: 160 mg via INTRAVENOUS

## 2017-11-24 MED ORDER — NITROGLYCERIN 2 % TD OINT
0.5000 [in_us] | TOPICAL_OINTMENT | Freq: Once | TRANSDERMAL | Status: AC
Start: 2017-11-24 — End: 2017-11-24
  Administered 2017-11-24: 0.5 [in_us] via TOPICAL

## 2017-11-24 MED ORDER — PROPOFOL 1000 MG/100ML IV EMUL
5.0000 ug/kg/min | INTRAVENOUS | Status: DC
  Administered 2017-11-24: 35 ug/kg/min via INTRAVENOUS
  Administered 2017-11-24: 25 ug/kg/min via INTRAVENOUS
  Administered 2017-11-24: 65 ug/kg/min via INTRAVENOUS
  Administered 2017-11-24: 35 ug/kg/min via INTRAVENOUS
  Administered 2017-11-25 – 2017-11-26 (×10): 50 ug/kg/min via INTRAVENOUS
  Administered 2017-11-27: 60 ug/kg/min via INTRAVENOUS
  Administered 2017-11-27: 40 ug/kg/min via INTRAVENOUS
  Administered 2017-11-27 (×3): 60 ug/kg/min via INTRAVENOUS
  Administered 2017-11-27: 50 ug/kg/min via INTRAVENOUS
  Administered 2017-11-27: 30 ug/kg/min via INTRAVENOUS
  Administered 2017-11-27: 60 ug/kg/min via INTRAVENOUS
  Administered 2017-11-27 – 2017-11-28 (×2): 50 ug/kg/min via INTRAVENOUS
  Administered 2017-11-28: 60 ug/kg/min via INTRAVENOUS
  Administered 2017-11-28: 40 ug/kg/min via INTRAVENOUS
  Administered 2017-11-28: 60 ug/kg/min via INTRAVENOUS
  Filled 2017-11-24 (×30): qty 100

## 2017-11-24 NOTE — Progress Notes (Signed)
PHARMACIST - PHYSICIAN COMMUNICATION  CONCERNING: IV to Oral Route Change Policy  RECOMMENDATION: This patient is receiving famotidine by the intravenous route.  Based on criteria approved by the Pharmacy and Therapeutics Committee, the intravenous medication(s) is/are being converted to the equivalent oral dose form(s).   DESCRIPTION: These criteria include:  The patient is eating (either orally or via tube) and/or has been taking other orally administered medications for a least 24 hours  The patient has no evidence of active gastrointestinal bleeding or impaired GI absorption (gastrectomy, short bowel, patient on TNA or NPO).  If you have questions about this conversion, please contact the Pharmacy Department  []   [phone] )  [facility] [x]   [phone] )  [facility] []   [phone] )  [facility] []   [phone] )  [facility] []   [phone] )  [facility]   Ewel Lona L, [facility] 11/24/2017 11:09 AM

## 2017-11-24 NOTE — Progress Notes (Signed)
Inpatient Diabetes Program Recommendations  AACE/ADA: New Consensus Statement on Inpatient Glycemic Control (2015)  Target Ranges:  Prepandial:   less than 140 mg/dL      Peak postprandial:   less than 180 mg/dL (1-2 hours)      Critically ill patients:  140 - 180 mg/dL   Results for Jorge Cruz, Jorge Cruz (MRN [id number]) as of 11/24/2017 11:48  Ref. Range 11/24/2017 04:31 11/24/2017 07:43 11/24/2017 11:41  Glucose-Capillary Latest Ref Range: 65 - 99 mg/dL 419 (H) 379 (H)  3 units NOVOLOG  231 (H)    Admit Acute respiratory failure with hypoxia.   History: DM, COPD, CHF  Home DM Meds: Levemir 50 units QHS        Metformin 1000 mg BID  Current Orders: Lantus 30 units QHS      Novolog Sensitive Correction Scale/ SSI (0-9 units) Q4 hours     Novolog started this AM and Lantus to start tonight.   Currently Intubated.     MD- Note Lantus was NOT given at 6am today per [facility] documentation.  Won't get 1st dose until tonight at 10pm.  Please consider starting Lantus early this afternoon since has not gotten dose since admission.      --Will follow patient during hospitalization--  Ambrose Finland RN, MSN, CDE Diabetes Coordinator Inpatient Glycemic Control Team Team Pager: [phone] (8a-5p)

## 2017-11-24 NOTE — Progress Notes (Signed)
eLink Physician-Brief Progress Note Patient Name: Jorge Cruz DOB: Jan 13, 1951 MRN: [id number]   Date of Service  11/24/2017  HPI/Events of Note  New admit evaluation-resting comfortably  eICU Interventions  Lovenox for DVT prophylaxis; Vent orders, Pepcid for stress ulcer prophylaxis, Resp rate increased to 30 due to hypercapnia and resp acidosis        Baili Stang U Blanchard [place] 11/24/2017, 5:06 AM

## 2017-11-24 NOTE — ED Provider Notes (Signed)
[facility] Emergency Department Provider Note    First MD Initiated Contact with Patient 11/24/17 [phone]     (approximate)  I have reviewed the triage vital signs and the nursing notes.  Level 5 caveat: History limited secondary to respiratory distress such history primarily obtained from EMS and subsequently the patient's wife HISTORY  Chief Complaint Respiratory Distress    HPI Jorge Cruz is a 67 y.o. male with below list of chronic medical conditions including COPD and congestive heart failure presents to the emergency department via EMS with acute respiratory distress.  Per EMS on their arrival patient's oxygen saturations 50% for the fire department patient was given for DuoNeb's 125 mg of Solu-Medrol and 0.3 of epinephrine.  EMS light CPAP to the patient however oxygen saturation still remaining in the high 60s-70.  Patient presents to the emergency department still in apparent respiratory distress tachypneic with a respiratory rate of 3 with hypoxia.  Per EMS patient's wife states that patient started experiencing shortness of breath yesterday evening after work which progressively worsened over the course of the night.   Past Medical History:  Diagnosis Date  . Chronic systolic CHF (congestive heart failure) ([place])    a. 03/2014 Echo: EF 45%, glob HK; b. 03/2016 Echo: EF 20-25%, diff HK, Gr1 DD, mild MR.  Marland Kitchen COPD (chronic obstructive pulmonary disease) ([place])   . Diabetes mellitus without complication ([place])   . Hypertension   . Hypertensive heart disease   . NICM (nonischemic cardiomyopathy) ([place])    a. 03/2014 Echo: EF 45%, glob HK, mild conc LVH; b. 03/2014 MV: possible mild ischemia, superimposed on small inf infarct, EF 36%; c. 03/2016 MV: EF <30%, no ischemia; d. 03/2016 Echo: EF 20-25%, diff HK, Gr1 DD, mild MR.  . Polysubstance abuse ([place])   . Tobacco abuse     Patient Active Problem List   Diagnosis Date Noted  . Acute respiratory failure with  hypoxia ([place]) 11/24/2017  . Chronic systolic heart failure ([place]) 06/09/2017  . Tobacco use 06/09/2017  . NSTEMI (non-ST elevated myocardial infarction) ([place]) 05/31/2017  . COPD exacerbation ([place]) 04/01/2016  . Essential hypertension 04/01/2016  . HLD (hyperlipidemia) 04/01/2016  . Atypical chest pain 03/31/2016  . Elevated troponin 03/31/2016  . Diabetes ([place]) 03/31/2016    Past Surgical History:  Procedure Laterality Date  . RIGHT/LEFT HEART CATH AND CORONARY ANGIOGRAPHY N/A 06/01/2017   Procedure: RIGHT/LEFT HEART CATH AND CORONARY ANGIOGRAPHY;  Surgeon: Wellington Hampshire, MD;  Location: [facility];  Service: Cardiovascular;  Laterality: N/A;    Prior to Admission medications   Medication Sig Start Date End Date Taking? Authorizing Provider  albuterol (PROVENTIL HFA;VENTOLIN HFA) 108 (90 Base) MCG/ACT inhaler Inhale 2 puffs into the lungs every 6 (six) hours as needed for wheezing or shortness of breath.   Yes [provider]  aspirin EC 81 MG tablet Take 1 tablet (81 mg total) by mouth daily. 04/01/16  Yes Fritzi Mandes, MD  atorvastatin (LIPITOR) 80 MG tablet Take 80 mg by mouth daily.   Yes [provider]  carvedilol (COREG) 3.125 MG tablet Take 3.125 mg by mouth 2 (two) times daily with a meal.   Yes [provider]  furosemide (LASIX) 20 MG tablet Take 1 tablet (20 mg total) by mouth daily. 05/03/16 05/30/18 Yes End, Harrell Gave, MD  insulin detemir (LEVEMIR) 100 UNIT/ML injection Inject 50 Units into the skin at bedtime.   Yes [provider]  losartan (COZAAR) 25 MG  tablet Take 25 mg by mouth daily.   Yes [provider]  metFORMIN (GLUCOPHAGE) 1000 MG tablet Take 1 tablet by mouth 2 (two) times daily.   Yes [provider]  nitroGLYCERIN (NITROSTAT) 0.4 MG SL tablet Place 0.4 mg under the tongue every 5 (five) minutes as needed for chest pain.   Yes [provider]  potassium chloride SA (K-DUR,KLOR-CON) 20  MEQ tablet Take 1 tablet (20 mEq total) by mouth daily. 07/26/17  Yes Hackney, Tina A, FNP  sacubitril-valsartan (ENTRESTO) 24-26 MG Take 1 tablet by mouth 2 (two) times daily. 07/26/17  Yes Darylene Price A, FNP    Allergies No known drug allergies  Family History  Problem Relation Age of Onset  . Diabetes Mother   . Diabetes Father     Social History Social History   Tobacco Use  . Smoking status: Current Every Day Smoker    Packs/day: 1.00    Years: 50.00    Pack years: 50.00  . Smokeless tobacco: Never Used  Substance Use Topics  . Alcohol use: No  . Drug use: No    Review of Systems Constitutional: No fever/chills Eyes: No visual changes. ENT: No sore throat. Cardiovascular: Denies chest pain. Respiratory: Positive for shortness of breath. Gastrointestinal: No abdominal pain.  No nausea, no vomiting.  No diarrhea.  No constipation. Genitourinary: Negative for dysuria. Musculoskeletal: Negative for neck pain.  Negative for back pain. Integumentary: Negative for rash. Neurological: Negative for headaches, focal weakness or numbness.   ____________________________________________   PHYSICAL EXAM:  VITAL SIGNS: ED Triage Vitals  Enc Vitals Group     BP 11/24/17 0148 (!) 184/95     Pulse Rate 11/24/17 0150 (!) 123     Resp 11/24/17 0152 16     Temp 11/24/17 0207 (!) 93.4 F (34.1 C)     Temp src --      SpO2 11/24/17 0150 (!) 53 %     Weight 11/24/17 0201 102.1 kg (225 lb)     Height 11/24/17 0201 1.778 m (_0 )     Head Circumference --      Peak Flow --      Pain Score --      Pain Loc --      Pain Edu? --      Excl. in [place]? --     Constitutional: Somnolent, apparent respiratory distress Eyes: Conjunctivae are normal.  Head: Atraumatic. Mouth/Throat: Mucous membranes are moist.  Oropharynx non-erythematous.  Frothy sputum Neck: No stridor.   Cardiovascular: Tachycardia, regular rhythm. Good peripheral circulation. Grossly normal heart  sounds. Respiratory: Tachypnea, positive accessory respiratory muscle use, diffuse rales unable to speak Gastrointestinal: Soft and nontender. No distention.  Musculoskeletal: No lower extremity tenderness nor edema. No gross deformities of extremities. Neurologic: Unable to speak secondary to respiratory distress. No gross focal neurologic deficits are appreciated.  Skin:  Skin is warm, dry and intact. No rash noted.   ____________________________________________   LABS (all labs ordered are listed, but only abnormal results are displayed)  Labs Reviewed  CBC WITH DIFFERENTIAL/PLATELET - Abnormal; Notable for the following components:      Result Value   RBC 4.25 (*)    RDW 16.7 (*)    Lymphs Abs 4.3 (*)    All other components within normal limits  BASIC METABOLIC PANEL - Abnormal; Notable for the following components:   Glucose, Bld 358 (*)    BUN 36 (*)    Creatinine, Ser 1.62 (*)  Calcium 8.7 (*)    GFR calc non Af Amer 43 (*)    GFR calc Af Amer 49 (*)    All other components within normal limits  BRAIN NATRIURETIC PEPTIDE - Abnormal; Notable for the following components:   B Natriuretic Peptide 519.0 (*)    All other components within normal limits  TROPONIN I - Abnormal; Notable for the following components:   Troponin I 0.08 (*)    All other components within normal limits  BLOOD GAS, ARTERIAL - Abnormal; Notable for the following components:   pH, Arterial 7.12 (*)    pCO2 arterial 81 (*)    Acid-base deficit 5.0 (*)    All other components within normal limits  TSH  TROPONIN I  TROPONIN I  TROPONIN I   ____________________________________________  EKG  ED ECG REPORT I, [place] N Aubryn Spinola, the attending physician, personally viewed and interpreted this ECG.   Date: 11/24/2017  EKG Time: 1:54 AM  Rate: 132  Rhythm: Sinus tachycardia with premature ventricular contraction  Axis: Normal  Intervals: Normal  ST&T Change:  None  ____________________________________________  RADIOLOGY I, [place] N Suni Jarnagin, personally viewed and evaluated these images (plain radiographs) as part of my medical decision making, as well as reviewing the written report by the radiologist.  ED MD interpretation: Cardiomegaly with diffuse pulmonary edema vascular congestion.  Official radiology report(s): Dg Chest Port 1 View  Result Date: 11/24/2017 CLINICAL DATA:  Shortness of breath, post intubation EXAM: PORTABLE CHEST 1 VIEW COMPARISON:  05/30/2017 FINDINGS: Endotracheal tube tip possibly visible at the thoracic inlet. Poorly visible distal esophageal tubing, appears to course below the diaphragm but the tip is not well seen. Cardiomegaly with vascular congestion and moderate diffuse interstitial opacity concerning for pulmonary edema. No pneumothorax. IMPRESSION: 1. Tip of the endotracheal tube appears to be positioned at or slightly above the thoracic inlet 2. Poor visibility of the distal portion of the esophageal tube, appears to course below the diaphragm but tip is not included 3. Cardiomegaly with vascular congestion and moderate diffuse pulmonary edema Electronically Signed   By: Donavan Foil M.D.   On: 11/24/2017 02:48      .Critical Care Performed by: Gregor Hams, MD Authorized by: Gregor Hams, MD   Critical care provider statement:    Critical care time (minutes):  60   Critical care time was exclusive of:  Separately billable procedures and treating other patients and teaching time   Critical care was necessary to treat or prevent imminent or life-threatening deterioration of the following conditions:  Respiratory failure   Critical care was time spent personally by me on the following activities:  Development of treatment plan with patient or surrogate, discussions with consultants, evaluation of patient's response to treatment, examination of patient, obtaining history from patient or surrogate, ordering  and performing treatments and interventions, ordering and review of laboratory studies, ordering and review of radiographic studies, pulse oximetry, re-evaluation of patient's condition and review of old charts   I assumed direction of critical care for this patient from another provider in my specialty: no   Procedure Name: Intubation Date/Time: 11/24/2017 4:25 AM Performed by: Gregor Hams, MD Pre-anesthesia Checklist: Patient identified, Patient being monitored, Emergency Drugs available, Timeout performed and Suction available Oxygen Delivery Method: Non-rebreather mask Preoxygenation: Pre-oxygenation with 100% oxygen Induction Type: Rapid sequence Ventilation: Mask ventilation without difficulty Laryngoscope Size: Mac and 4 Tube size: 7.5 mm Number of attempts: 1 Placement Confirmation: ETT inserted through vocal cords under  direct vision,  CO2 detector and Breath sounds checked- equal and bilateral Secured at: 22 cm Tube secured with: ETT holder Dental Injury: Teeth and Oropharynx as per pre-operative assessment  Difficulty Due To: Difficulty was anticipated        ____________________________________________   INITIAL IMPRESSION / ASSESSMENT AND PLAN / ED COURSE  As part of my medical decision making, I reviewed the following data within the electronic MEDICAL RECORD NUMBER   67 year old male presenting with above-stated history and physical exam of respiratory distress with impending rest tori failure.  Patient underwent endotracheal intubation on arrival given somnolence and inadequate oxygenation and ventilation with the BiPAP.  Suspect pulmonary edema as the etiology for the patient's symptoms in the such Lasix 40 mg was given Nitropaste was applied to the patient's chest.  Chest x-ray findings consistent with pulmonary edema.  Patient discussed with Dr. Timmothy Sours for hospital admission for further evaluation and management for acute CHF exacerbation with pulmonary edema.  I spoke  with the patient's wife and informed her of all clinical findings. ____________________________________________  FINAL CLINICAL IMPRESSION(S) / ED DIAGNOSES  Final diagnoses:  Acute respiratory failure with hypoxia (HCC)  Acute pulmonary edema (HCC)     MEDICATIONS GIVEN DURING THIS VISIT:  Medications  enoxaparin (LOVENOX) injection 40 mg (has no administration in time range)  acetaminophen (TYLENOL) tablet 650 mg (has no administration in time range)    Or  acetaminophen (TYLENOL) suppository 650 mg (has no administration in time range)  ondansetron (ZOFRAN) tablet 4 mg (has no administration in time range)    Or  ondansetron (ZOFRAN) injection 4 mg (has no administration in time range)  docusate sodium (COLACE) capsule 100 mg (has no administration in time range)  insulin aspart (novoLOG) injection 0-9 Units (has no administration in time range)  insulin glargine (LANTUS) injection 30 Units (has no administration in time range)  etomidate (AMIDATE) injection 20 mg (20 mg Intravenous Given 11/24/17 0150)  succinylcholine (ANECTINE) injection 160 mg (160 mg Intravenous Given 11/24/17 0151)  nitroGLYCERIN (NITROGLYN) 2 % ointment 0.5 inch (0.5 inches Topical Given 11/24/17 0209)  furosemide (LASIX) injection 40 mg (40 mg Intravenous Given 11/24/17 0210)  rocuronium (ZEMURON) injection (50 mg Intravenous Given 11/24/17 0206)  propofol (DIPRIVAN) 1000 MG/100ML infusion (55 mcg/kg/min  102.1 kg Intravenous Rate/Dose Change 11/24/17 0411)     ED Discharge Orders    None       Note:  This document was prepared using Dragon voice recognition software and may include unintentional dictation errors.    Gregor Hams, MD 11/24/17 [phone]

## 2017-11-24 NOTE — Progress Notes (Addendum)
Initial Nutrition Assessment  DOCUMENTATION CODES:   Obesity unspecified  INTERVENTION:   Initiate Vital HP at goal rate of 26ml/hr  Add Prostat 35ml QID via tube- each supplement provides 100kcal and 15g protein  Propofol: 15.3 ml/hr (provides 404kcal/day)  Regimen provides 1564kcal/day, 152g/day protein, 375ml/day free water  Liquid MVI daily via tube   NUTRITION DIAGNOSIS:   Inadequate oral intake related to other (see comment)(pt sedated and ventillated ) as evidenced by NPO status.  GOAL:   Provide needs based on ASPEN/SCCM guidelines  MONITOR:   Vent status, Labs, Weight trends, TF tolerance, I & O's  REASON FOR ASSESSMENT:   Ventilator    ASSESSMENT:   67 y.o. male with h/o COPD and congestive heart failure presents to the emergency department via EMS with acute respiratory distress.   Pt sedated and ventilated. No family at bedside. Plan to initiate TFs today. Per chart, pt is weight stable. Suspect low refeeding risk.    Medications reviewed and include: colace, lovenox, pepcid, insulin, propofol  Labs reviewed: BUN 36(H), creat 1.62(H), Ca 8.7(L) BNP- 519(H) Triglycerides- 160(H) Glucose- 358  Nutrition-Focused physical exam completed. Findings are no fat depletion, no muscle depletion, and no edema.   Patient is currently intubated on ventilator support MV: 13 L/min Temp (24hrs), Avg:97 F (36.1 C), Min:93.4 F (34.1 C), Max:98.1 F (36.7 C)  Propofol: 15.3 ml/hr (provides 404kcal/day)  MAP- >6mmHg  Diet Order:  Diet NPO time specified  EDUCATION NEEDS:   Not appropriate for education at this time  Skin:  Reviewed RN Assessment  Last BM:  pta  Height:   Ht Readings from Last 1 Encounters:  11/24/17 5\' 10"  (1.778 m)    Weight:   Wt Readings from Last 1 Encounters:  11/24/17 222 lb 10.6 oz (101 kg)    Ideal Body Weight:  75.4 kg  BMI:  Body mass index is 31.95 kg/m.  Estimated Nutritional Needs:   Kcal:   1100-1400kcal/day   Protein:  >150g/day   Fluid:  >1.9L/day   Betsey Holiday MS, RD, LDN Pager #[phone] After Hours Pager: [phone]

## 2017-11-24 NOTE — ED Notes (Signed)
Date and time results received: 11/24/17 0250 (use smartphrase ".now" to insert current time)  Test: Trop I Critical Value: 0.08  Name of Provider Notified: Manson Passey  Orders Received? Or Actions Taken? No new orders.

## 2017-11-24 NOTE — ED Triage Notes (Signed)
Per EMS: EMS called out for respiratory distress by patient's wife. Patient's oxygen saturation in the 50's at Fire Department arrival.   EMS administered 0.3 epi, 4 duoneb treatments, and 125 mg solumedrol in route.   Patient on CPAP at arrival to this ED. Patient's oxygen saturation in low 70's at arrival to ED.

## 2017-11-24 NOTE — H&P (Signed)
Jorge Cruz is an 67 y.o. male.   Chief Complaint: Respiratory distress HPI: The patient with nonischemic cardiomyopathy, hypertension and diabetes presents emergency department via EMS after acutely becoming short of breath.  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  He has had breathing difficulties in the past that have resolved with inhaler use but the onset of his symptoms was rapid tonight and did not respond to inhalers.  First responders arrived and gave the patient intravenous epinephrine but he did not require CPR.  He also received multiple breathing treatments as well as Solu-Medrol 125 mg in route to the emergency department.  The patient continued to have respiratory distress and was intubated.  Chest x-ray showed pulmonary edema.  He was given Lasix by the emergency department staff prior to the hospitalist service being called for further management.  Past Medical History:  Diagnosis Date  . Chronic systolic CHF (congestive heart failure) ([place])    a. 03/2014 Echo: EF 45%, glob HK; b. 03/2016 Echo: EF 20-25%, diff HK, Gr1 DD, mild MR.  Marland Kitchen COPD (chronic obstructive pulmonary disease) ([place])   . Diabetes mellitus without complication ([place])   . Hypertension   . Hypertensive heart disease   . NICM (nonischemic cardiomyopathy) ([place])    a. 03/2014 Echo: EF 45%, glob HK, mild conc LVH; b. 03/2014 MV: possible mild ischemia, superimposed on small inf infarct, EF 36%; c. 03/2016 MV: EF <30%, no ischemia; d. 03/2016 Echo: EF 20-25%, diff HK, Gr1 DD, mild MR.  . Polysubstance abuse ([place])   . Tobacco abuse     Past Surgical History:  Procedure Laterality Date  . RIGHT/LEFT HEART CATH AND CORONARY ANGIOGRAPHY N/A 06/01/2017   Procedure: RIGHT/LEFT HEART CATH AND CORONARY ANGIOGRAPHY;  Surgeon: Wellington Hampshire, MD;  Location: [facility];  Service: Cardiovascular;  Laterality: N/A;    Family  History  Problem Relation Age of Onset  . Diabetes Mother   . Diabetes Father    Social History:  reports that he has been smoking.  He has a 50.00 pack-year smoking history. He has never used smokeless tobacco. He reports that he does not drink alcohol or use drugs.  Allergies: No Known Allergies  Prior to Admission medications   Medication Sig Start Date End Date Taking? Authorizing Provider  albuterol (PROVENTIL HFA;VENTOLIN HFA) 108 (90 Base) MCG/ACT inhaler Inhale 2 puffs into the lungs every 6 (six) hours as needed for wheezing or shortness of breath.   Yes [provider]  aspirin EC 81 MG tablet Take 1 tablet (81 mg total) by mouth daily. 04/01/16  Yes Fritzi Mandes, MD  atorvastatin (LIPITOR) 80 MG tablet Take 80 mg by mouth daily.   Yes [provider]  carvedilol (COREG) 3.125 MG tablet Take 3.125 mg by mouth 2 (two) times daily with a meal.   Yes [provider]  furosemide (LASIX) 20 MG tablet Take 1 tablet (20 mg total) by mouth daily. 05/03/16 05/30/18 Yes End, Harrell Gave, MD  insulin detemir (LEVEMIR) 100 UNIT/ML injection Inject 50 Units into the skin at bedtime.   Yes [provider]  losartan (COZAAR) 25 MG tablet Take 25 mg by mouth daily.   Yes [provider]  metFORMIN (GLUCOPHAGE) 1000 MG tablet Take 1 tablet by mouth 2 (two) times daily.   Yes [provider]  nitroGLYCERIN (NITROSTAT) 0.4 MG SL tablet Place 0.4  mg under the tongue every 5 (five) minutes as needed for chest pain.   Yes [provider]  potassium chloride SA (K-DUR,KLOR-CON) 20 MEQ tablet Take 1 tablet (20 mEq total) by mouth daily. 07/26/17  Yes Hackney, Tina A, FNP  sacubitril-valsartan (ENTRESTO) 24-26 MG Take 1 tablet by mouth 2 (two) times daily. 07/26/17  Yes Alisa Graff, FNP     Results for orders placed or performed during the hospital encounter of 11/24/17 (from the past 48 hour(s))  CBC with Differential     Status: Abnormal    Collection Time: 11/24/17  2:02 AM  Result Value Ref Range   WBC 7.8 3.8 - 10.6 K/uL   RBC 4.25 (L) 4.40 - 5.90 MIL/uL   Hemoglobin 13.5 13.0 - 18.0 g/dL   HCT 41.3 40.0 - 52.0 %   MCV 97.2 80.0 - 100.0 fL   MCH 31.7 26.0 - 34.0 pg   MCHC 32.6 32.0 - 36.0 g/dL   RDW 16.7 (H) 11.5 - 14.5 %   Platelets 226 150 - 440 K/uL   Neutrophils Relative % 34 %   Lymphocytes Relative 56 %   Monocytes Relative 8 %   Eosinophils Relative 1 %   Basophils Relative 1 %   Neutro Abs 2.7 1.4 - 6.5 K/uL   Lymphs Abs 4.3 (H) 1.0 - 3.6 K/uL   Monocytes Absolute 0.6 0.2 - 1.0 K/uL   Eosinophils Absolute 0.1 0 - 0.7 K/uL   Basophils Absolute 0.1 0 - 0.1 K/uL   Smear Review MORPHOLOGY UNREMARKABLE     Comment: Performed at [facility], [address] [place], [place] [address]  Basic metabolic panel     Status: Abnormal   Collection Time: 11/24/17  2:02 AM  Result Value Ref Range   Sodium 141 135 - 145 mmol/L   Potassium 4.1 3.5 - 5.1 mmol/L   Chloride 107 101 - 111 mmol/L   CO2 25 22 - 32 mmol/L   Glucose, Bld 358 (H) 65 - 99 mg/dL   BUN 36 (H) 6 - 20 mg/dL   Creatinine, Ser 1.62 (H) 0.61 - 1.24 mg/dL   Calcium 8.7 (L) 8.9 - 10.3 mg/dL   GFR calc non Af Amer 43 (L) >60 mL/min   GFR calc Af Amer 49 (L) >60 mL/min    Comment: (NOTE) The eGFR has been calculated using the CKD EPI equation. This calculation has not been validated in all clinical situations. eGFR's persistently <60 mL/min signify possible Chronic Kidney Disease.    Anion gap 9 5 - 15    Comment: Performed at [facility], [address] [place], [place] [address]  Brain natriuretic peptide     Status: Abnormal   Collection Time: 11/24/17  2:02 AM  Result Value Ref Range   B Natriuretic Peptide 519.0 (H) 0.0 - 100.0 pg/mL    Comment: Performed at [facility], [address] [place], [place] [address]  Troponin I     Status: Abnormal   Collection Time: 11/24/17  2:02 AM  Result Value Ref Range    Troponin I 0.08 (HH) <0.03 ng/mL    Comment: CRITICAL RESULT CALLED TO, READ BACK BY AND VERIFIED WITH RACHEL HAYDEN RN AT 0737 11/24/17. MSS Performed at [facility], [address] [place], [place] [address]   Blood gas, arterial     Status: Abnormal   Collection Time: 11/24/17  3:13 AM  Result Value Ref Range   FIO2 0.70  Delivery systems VENTILATOR    Mode VOLUME SUPPORT    VT 500 mL   LHR 22 resp/min   Peep/cpap 8.0 cm H20   pH, Arterial 7.12 (LL) 7.350 - 7.450    Comment: CRITICAL RESULT CALLED TO, READ BACK BY AND VERIFIED WITH:  DR Owens Shark AT 0319 ON 11/24/17 BL    pCO2 arterial 81 (HH) 32.0 - 48.0 mmHg    Comment: CRITICAL RESULT CALLED TO, READ BACK BY AND VERIFIED WITH:  DR Owens Shark AT 0867 ON 11/24/17 BL    pO2, Arterial 83 83.0 - 108.0 mmHg   Bicarbonate 26.3 20.0 - 28.0 mmol/L   Acid-base deficit 5.0 (H) 0.0 - 2.0 mmol/L   O2 Saturation 91.7 %   Patient temperature 37.0    Collection site RIGHT RADIAL    Sample type ARTERIAL DRAW    Allens test (pass/fail) PASS PASS    Comment: Performed at [facility], [address] [place], [place] [address]   Dg Chest Port 1 View  Result Date: 11/24/2017 CLINICAL DATA:  Shortness of breath, post intubation EXAM: PORTABLE CHEST 1 VIEW COMPARISON:  05/30/2017 FINDINGS: Endotracheal tube tip possibly visible at the thoracic inlet. Poorly visible distal esophageal tubing, appears to course below the diaphragm but the tip is not well seen. Cardiomegaly with vascular congestion and moderate diffuse interstitial opacity concerning for pulmonary edema. No pneumothorax. IMPRESSION: 1. Tip of the endotracheal tube appears to be positioned at or slightly above the thoracic inlet 2. Poor visibility of the distal portion of the esophageal tube, appears to course below the diaphragm but tip is not included 3. Cardiomegaly with vascular congestion and moderate diffuse pulmonary edema Electronically Signed   By: Donavan Foil M.D.    On: 11/24/2017 02:48    Review of Systems  Unable to perform ROS: Critical illness    Blood pressure (!) 156/77, pulse (!) 120, temperature (!) 97.4 F (36.3 C), resp. rate (!) 24, height _0  (1.778 m), weight 102.1 kg (225 lb), SpO2 (!) 87 %. Physical Exam  Constitutional: He appears well-developed and well-nourished. No distress. He is intubated.  HENT:  Head: Normocephalic and atraumatic.  Mouth/Throat: Oropharynx is clear and moist.  Eyes: Pupils are equal, round, and reactive to light. Conjunctivae are normal. No scleral icterus.  Neck: Normal range of motion. Neck supple. No JVD present. No tracheal deviation present. No thyromegaly present.  Cardiovascular: Normal rate, regular rhythm and normal heart sounds. Exam reveals no gallop and no friction rub.  No murmur heard. Respiratory: He is intubated.  GI: Soft. Bowel sounds are normal. He exhibits no distension. There is no tenderness.  Genitourinary:  Genitourinary Comments: Foley in place  Musculoskeletal: Normal range of motion. He exhibits no edema.  Lymphadenopathy:    He has no cervical adenopathy.  Neurological: No cranial nerve deficit.  Sedated and intubated  Skin: Skin is warm and dry. No rash noted. No erythema.  Psychiatric:  Cannot assess as the patient is sedated and intubated     Assessment/Plan This is a 67 year old male admitted for acute respiratory failure with hypoxia. 1.  Respiratory failure: Acute; with hypoxia due to pulmonary edema secondary to acute on chronic systolic heart failure.  Last EF reportedly 45%.  Continue to diurese.  The patient currently has a respiratory acidosis.  I have increased his ventilation rate.  Further management by ICU staff. 2.  Elevated troponin: EKG does not show indication of myocardial ischemia however the patient's troponin is elevated.  This  is likely secondary to epinephrine administration which is apparently part of the first responder protocol for respiratory  distress.  This will need to be evaluated.  To need to follow cardiac enzymes and monitor telemetry. 3.  COPD: This underlying condition but does not help the patient's respiratory status although his chest x-ray at this time is more consistent with heart failure.  He did receive steroids which will need to be tapered.  Continue breathing treatments as needed. 4.  Diabetes mellitus type 2: Continue basal insulin therapy as well as sliding scale insulin while hospitalized.  Hold oral hypoglycemic agents. 5.  DVT prophylaxis: Lovenox 6.  GI prophylaxis: Initiate PPI 24 hours after intubation The patient is a full code.  Time spent on admission orders and patient care approximately 45 minutes.  Discussed with [facility] telemedicine  Harrie Foreman, MD 11/24/2017, 3:33 AM

## 2017-11-24 NOTE — Progress Notes (Signed)
Pt transported to ICU 2 on vent without incident. Pt remains on vent. Report given to ICU RT.

## 2017-11-24 NOTE — Progress Notes (Signed)
Chaplain responded to a page for Pt with breathing difficulty. Chaplain prayed for Pt and care team. Chaplain escorted spouse to consultation room. Chaplain practiced ministry of presence and  Active listening with spouse. Dr consulted with spouse and chaplain offered encouragement. Chaplain escorted spouse to bedside and obtained refreshments and blankets. Spouse said she wanted to rest  she and pt had just got off of work. Chaplain will be available when needed.    11/24/17 0200  Clinical Encounter Type  Visited With Family;Patient and family together  Visit Type Initial;Spiritual support  Referral From Nurse  Spiritual Encounters  Spiritual Needs Prayer;Emotional

## 2017-11-24 NOTE — ED Notes (Signed)
Patient oxygen saturation went to 87%, RT called to bedside

## 2017-11-24 NOTE — Consult Note (Signed)
[facility] Critical Care Medicine Consultation    ASSESSMENT/PLAN   Respiratory failure. Patient with hypercapnic respiratory failure. Presently on mechanical ventilation. Chest x-ray did not show clear pneumonia more consistent with cardiomegaly and heart failure. Pending echocardiography. No evidence of bronchospasm on exam. White count is within normal range. No clear  Sepsis noted  Renal insufficiency. BUN 36 and creatinine 1.62. We'll support hemodynamics, avoid nephrotoxic agents and follow closely. If does not. Foley catheter in placeto monitor outputs accurately  Hyperglycemia. Placed on sliding scale  Positive troponin. No clear evidence of ischemic pattern although patient does have underlying repolarization abnormality Peak troponin is 0.13. Most likely reflects supply demand ischemia. Will follow closely  Critical care time 40 minutes VENTILATOR SETTINGS: Vent Mode: PRVC FiO2 (%):  [40 %-80 %] 60 % Set Rate:  [22 bmp-30 bmp] 30 bmp Vt Set:  [500 mL] 500 mL PEEP:  [8 cmH20] 8 cmH20  INTAKE / OUTPUT:  Intake/Output Summary (Last 24 hours) at 11/24/2017 0808 Last data filed at 11/24/2017 1610 Gross per 24 hour  Intake 50 ml  Output 1525 ml  Net -1475 ml    Name: Jorge Cruz MRN: [id number] DOB: February 12, 1951    ADMISSION DATE:  11/24/2017 CONSULTATION DATE: 11/24/2017  REFERRING MD :  Hospitalist  CHIEF COMPLAINT:  Acute Shortness of Breath   HISTORY OF PRESENT ILLNESS:  Jorge Cruz is a 67 year old gentleman with a past medical history remarkable for heart failure with reduced ejection fraction, last measured as 20-25%, CO, diabetes, hypertension, polysubstance abuse,tobacco abuse was brought into the emergency department by EMS secondary to acute onset of shortness of breath. He initially tried inhalers without response. Patient was giving bronchodilators by EMS and in the emergency department. Was also given Solu-Medrol without any success. Chest x-ray revealed pulmonary  edema pattern. He was given diuretics and subsequently required intubation and transfer to the intensive care unit. Presently he is sedated on propofol,intubated with stable hemodynamics  PAST MEDICAL HISTORY :  Past Medical History:  Diagnosis Date  . Chronic systolic CHF (congestive heart failure) ([facility])    a. 03/2014 Echo: EF 45%, glob HK; b. 03/2016 Echo: EF 20-25%, diff HK, Gr1 DD, mild MR.  Marland Kitchen COPD (chronic obstructive pulmonary disease) ([facility])   . Diabetes mellitus without complication ([facility])   . Hypertension   . Hypertensive heart disease   . NICM (nonischemic cardiomyopathy) ([facility])    a. 03/2014 Echo: EF 45%, glob HK, mild conc LVH; b. 03/2014 MV: possible mild ischemia, superimposed on small inf infarct, EF 36%; c. 03/2016 MV: EF <30%, no ischemia; d. 03/2016 Echo: EF 20-25%, diff HK, Gr1 DD, mild MR.  . Polysubstance abuse ([facility])   . Tobacco abuse    Past Surgical History:  Procedure Laterality Date  . RIGHT/LEFT HEART CATH AND CORONARY ANGIOGRAPHY N/A 06/01/2017   Procedure: RIGHT/LEFT HEART CATH AND CORONARY ANGIOGRAPHY;  Surgeon: Iran Ouch, MD;  Location: [facility] CV LAB;  Service: Cardiovascular;  Laterality: N/A;   Prior to Admission medications   Medication Sig Start Date End Date Taking? Authorizing Provider  albuterol (PROVENTIL HFA;VENTOLIN HFA) 108 (90 Base) MCG/ACT inhaler Inhale 2 puffs into the lungs every 6 (six) hours as needed for wheezing or shortness of breath.   Yes [provider]  aspirin EC 81 MG tablet Take 1 tablet (81 mg total) by mouth daily. 04/01/16  Yes Enedina Finner, MD  atorvastatin (LIPITOR) 80 MG tablet Take 80 mg by mouth daily.   Yes [provider]  carvedilol (COREG) 3.125 MG tablet Take 3.125 mg by mouth 2 (two) times daily with a meal.   Yes [provider]  furosemide (LASIX) 20 MG tablet Take 1 tablet (20 mg total) by mouth daily. 05/03/16 05/30/18 Yes End, [name], MD  insulin detemir (LEVEMIR) 100 UNIT/ML  injection Inject 50 Units into the skin at bedtime.   Yes [provider]  losartan (COZAAR) 25 MG tablet Take 25 mg by mouth daily.   Yes [provider]  metFORMIN (GLUCOPHAGE) 1000 MG tablet Take 1 tablet by mouth 2 (two) times daily.   Yes [provider]  nitroGLYCERIN (NITROSTAT) 0.4 MG SL tablet Place 0.4 mg under the tongue every 5 (five) minutes as needed for chest pain.   Yes [provider]  potassium chloride SA (K-DUR,KLOR-CON) 20 MEQ tablet Take 1 tablet (20 mEq total) by mouth daily. 07/26/17  Yes Hackney, Tina A, FNP  sacubitril-valsartan (ENTRESTO) 24-26 MG Take 1 tablet by mouth 2 (two) times daily. 07/26/17  Yes Delma Freeze, FNP   No Known Allergies  FAMILY HISTORY:  Family History  Problem Relation Age of Onset  . Diabetes Mother   . Diabetes Father    SOCIAL HISTORY:  reports that he has been smoking.  He has a 50.00 pack-year smoking history. He has never used smokeless tobacco. He reports that he does not drink alcohol or use drugs.  REVIEW OF SYSTEMS:   Unable to obtain review of systems   VITAL SIGNS: Temp:  [93.4 F (34.1 C)-97.7 F (36.5 C)] 97.7 F (36.5 C) (04/05 0700) Pulse Rate:  [65-145] 82 (04/05 0700) Resp:  [8-33] 30 (04/05 0700) BP: (94-198)/(57-132) 118/80 (04/05 0700) SpO2:  [26 %-100 %] 99 % (04/05 0700) FiO2 (%):  [40 %-80 %] 60 % (04/05 0647) Weight:  [101 kg (222 lb 10.6 oz)-102.1 kg (225 lb)] 101 kg (222 lb 10.6 oz) (04/05 0431) HEMODYNAMICS:   VENTILATOR SETTINGS: Vent Mode: PRVC FiO2 (%):  [40 %-80 %] 60 % Set Rate:  [22 bmp-30 bmp] 30 bmp Vt Set:  [500 mL] 500 mL PEEP:  [8 cmH20] 8 cmH20 INTAKE / OUTPUT:  Intake/Output Summary (Last 24 hours) at 11/24/2017 0808 Last data filed at 11/24/2017 1610 Gross per 24 hour  Intake 50 ml  Output 1525 ml  Net -1475 ml    Physical Examination:   VS: BP 118/80   Pulse 82   Temp 97.7 F (36.5 C)   Resp (!) 30   Ht  (1.778 m)   Wt 101 kg  (222 lb 10.6 oz)   SpO2 99%   BMI 31.95 kg/m   General Appearance: No distress  Neuro:patient is sedated on propofol, limited exam HEENT: patient is orally intubated, oral gastric tube is noted  Pulmonary: crackles appreciated bilaterally CardiovascularNormal S1,S2.  No m/r/g.    Abdomen: Benign, Soft, non-tender, No masses, hepatosplenomegaly, No lymphadenopathy Skin:   warm, no rashes, no ecchymosis  Extremities: normal, no cyanosis, clubbing, no edema, warm with normal capillary refill.    LABS: Reviewed   LABORATORY PANEL:   CBC Recent Labs  Lab 11/24/17 0202  WBC 7.8  HGB 13.5  HCT 41.3  PLT 226    Chemistries  Recent Labs  Lab 11/24/17 0202  NA 141  K 4.1  CL 107  CO2 25  GLUCOSE 358*  BUN 36*  CREATININE 1.62*  CALCIUM 8.7*    Recent Labs  Lab 11/24/17 0431 11/24/17 0743  GLUCAP 243* 231*  Recent Labs  Lab 11/24/17 0313 11/24/17 0614  PHART 7.12* 7.31*  PCO2ART 81* 49*  PO2ART 83 264*   No results for input(s): AST, ALT, ALKPHOS, BILITOT, ALBUMIN in the last 168 hours.  Cardiac Enzymes Recent Labs  Lab 11/24/17 0202  TROPONINI 0.08*    RADIOLOGY:  Dg Chest Port 1 View  Result Date: 11/24/2017 CLINICAL DATA:  Shortness of breath, post intubation EXAM: PORTABLE CHEST 1 VIEW COMPARISON:  05/30/2017 FINDINGS: Endotracheal tube tip possibly visible at the thoracic inlet. Poorly visible distal esophageal tubing, appears to course below the diaphragm but the tip is not well seen. Cardiomegaly with vascular congestion and moderate diffuse interstitial opacity concerning for pulmonary edema. No pneumothorax. IMPRESSION: 1. Tip of the endotracheal tube appears to be positioned at or slightly above the thoracic inlet 2. Poor visibility of the distal portion of the esophageal tube, appears to course below the diaphragm but tip is not included 3. Cardiomegaly with vascular congestion and moderate diffuse pulmonary edema Electronically Signed   By: Jasmine Pang M.D.   On: 11/24/2017 02:48     Tora Kindred, DO 11/24/2017, 8:08 AM

## 2017-11-25 ENCOUNTER — Inpatient Hospital Stay (HOSPITAL_COMMUNITY)
Admit: 2017-11-25 | Discharge: 2017-11-25 | Disposition: A | Discharge status: Home / Self Care | Payer: Medicare Other | Attending: Internal Medicine | Admitting: Internal Medicine

## 2017-11-25 ENCOUNTER — Inpatient Hospital Stay: Payer: Medicare Other

## 2017-11-25 DIAGNOSIS — R9431 Abnormal electrocardiogram [ECG] [EKG]: Secondary | ICD-10-CM

## 2017-11-25 LAB — BASIC METABOLIC PANEL
Anion gap: 9 (ref 5–15)
BUN: 59 mg/dL — ABNORMAL HIGH (ref 6–20)
CO2: 22 mmol/L (ref 22–32)
Calcium: 8.7 mg/dL — ABNORMAL LOW (ref 8.9–10.3)
Chloride: 109 mmol/L (ref 101–111)
Creatinine, Ser: 1.71 mg/dL — ABNORMAL HIGH (ref 0.61–1.24)
GFR calc Af Amer: 46 mL/min — ABNORMAL LOW (ref >60)
GFR calc non Af Amer: 40 mL/min — ABNORMAL LOW (ref >60)
Glucose, Bld: 133 mg/dL — ABNORMAL HIGH (ref 65–99)
Potassium: 4.9 mmol/L (ref 3.5–5.1)
Sodium: 140 mmol/L (ref 135–145)

## 2017-11-25 LAB — BLOOD GAS, ARTERIAL
Acid-base deficit: 3.6 mmol/L — ABNORMAL HIGH (ref 0.0–2.0)
Bicarbonate: 22.7 mmol/L (ref 20.0–28.0)
FIO2: 0.3
MECHVT: 500 mL
O2 Saturation: 97.4 %
PEEP: 8 cmH2O
Patient temperature: 37
pCO2 arterial: 45 mmHg (ref 32.0–48.0)
pH, Arterial: 7.31 — ABNORMAL LOW (ref 7.350–7.450)
pO2, Arterial: 104 mmHg (ref 83.0–108.0)

## 2017-11-25 LAB — ECHOCARDIOGRAM COMPLETE
Height: 70 in
Weight: 3562.63 oz

## 2017-11-25 LAB — GLUCOSE, CAPILLARY
Glucose-Capillary: 106 mg/dL — ABNORMAL HIGH (ref 65–99)
Glucose-Capillary: 113 mg/dL — ABNORMAL HIGH (ref 65–99)
Glucose-Capillary: 127 mg/dL — ABNORMAL HIGH (ref 65–99)
Glucose-Capillary: 152 mg/dL — ABNORMAL HIGH (ref 65–99)
Glucose-Capillary: 91 mg/dL (ref 65–99)
Glucose-Capillary: 94 mg/dL (ref 65–99)

## 2017-11-25 LAB — MAGNESIUM: Magnesium: 2 mg/dL (ref 1.7–2.4)

## 2017-11-25 LAB — TROPONIN I: Troponin I: 0.31 ng/mL (ref <0.03)

## 2017-11-25 LAB — PHOSPHORUS: Phosphorus: 4.8 mg/dL — ABNORMAL HIGH (ref 2.5–4.6)

## 2017-11-25 NOTE — Progress Notes (Signed)
Received report from [place], [place].  Pt.'s VSS, pain controlled, will continue to monitor pt. Closely.

## 2017-11-25 NOTE — Progress Notes (Signed)
[facility] Progess Note    SYNOPSIS   67 year old gentleman with a past medical history remarkable for heart failure with reduced ejection fraction, last measured as 20-25%, COPD, diabetes, hypertension, polysubstance abuse,tobacco abuse was brought into the emergency department by EMS secondary to acute onset of shortness of breath. He initially tried inhalers without response. Patient was giving bronchodilators by EMS and in the emergency department. Was also given Solu-Medrol without any success. Chest x-ray revealed pulmonary edema pattern.  ASSESSMENT/PLAN   Respiratory failure. Patient with hypercapnic respiratory failure. Presently on mechanical ventilation. Chest x-ray did not show clear pneumonia more consistent with cardiomegaly and heart failure. Chest x-ray performed this morning revealsendotracheal tube above thoracic inlet. Will advance 2-3 cm. Arterial blood gas shows persistent hypercapnic respiratory failure most likely not ready for breathing trial and extubation.Pending echocardiography. No evidence of bronchospasm on exam. White count is within normal range. No clear  Sepsis noted  Renal insufficiency. BUN and creatinine slowly worsening. BUN is 59 and creatinine 1.71. Will obtain renal ultrasound  Hyperglycemia. Placed on sliding scale with improved glucoses  Positive troponin. No clear evidence of ischemic pattern although patient does have underlying repolarization abnormality Peak troponin is peak of 0.31.    Critical care time 35 minutes    VENTILATOR SETTINGS: Vent Mode: PRVC FiO2 (%):  [30 %-60 %] 30 % Set Rate:  [30 bmp] 30 bmp Vt Set:  [500 mL] 500 mL PEEP:  [8 cmH20] 8 cmH20 Plateau Pressure:  [26 cmH20-27 cmH20] 26 cmH20  CINTAKE / OUTPUT:  Intake/Output Summary (Last 24 hours) at 11/25/2017 0727 Last data filed at 11/25/2017 0700 Gross per 24 hour  Intake 1103.12 ml  Output 1150 ml  Net -46.88 ml    Name: Jorge Cruz MRN: [id number] DOB: 07-Apr-1951    ADMISSION DATE:  11/24/2017   SUBJECTIVE:   Pt currently on the ventilator, can not provide history or review of systems.   VITAL SIGNS: Temp:  [97.5 F (36.4 C)-100 F (37.8 C)] 99 F (37.2 C) (04/06 0700) Pulse Rate:  [69-89] 83 (04/06 0700) Resp:  [16-30] 30 (04/06 0700) BP: (89-121)/(59-80) 98/68 (04/06 0700) SpO2:  [91 %-100 %] 98 % (04/06 0700) FiO2 (%):  [30 %-60 %] 30 % (04/06 0700)   PHYSICAL EXAMINATION: Physical Examination:   VS: BP 98/68   Pulse 83   Temp 99 F (37.2 C)   Resp (!) 30   Ht 5\' 10"  (1.778 m)   Wt 101 kg (222 lb 10.6 oz)   SpO2 98%   BMI 31.95 kg/m   General Appearance: No distress  Neuro:patient is presently sedated on propofol, limited exam at this time HEENT: oral endotracheal tube in place, oral gastric tube Pulmonary: normal breath sounds   CardiovascularNormal S1,S2.  No m/r/g.   Abdomen: positive bowel sounds, soft exam. Skin:   warm, no rashes, no ecchymosis  Extremities: normal, no cyanosis, clubbing.    LABORATORY PANEL:   CBC Recent Labs  Lab 11/24/17 0202  WBC 7.8  HGB 13.5  HCT 41.3  PLT 226    Chemistries  Recent Labs  Lab 11/25/17 0438  NA 140  K 4.9  CL 109  CO2 22  GLUCOSE 133*  BUN 59*  CREATININE 1.71*  CALCIUM 8.7*  MG 2.0  PHOS 4.8*    Recent Labs  Lab 11/24/17 0431 11/24/17 0743 11/24/17 1141 11/24/17 1701 11/24/17 2014 11/25/17 0008  GLUCAP 243* 231* 231* 141* 133* 152*   Recent Labs  Lab 11/24/17 0313 11/24/17 0614 11/25/17 0502  PHART 7.12* 7.31* 7.31*  PCO2ART 81* 49* 45  PO2ART 83 264* 104   No results for input(s): AST, ALT, ALKPHOS, BILITOT, ALBUMIN in the last 168 hours.  Cardiac Enzymes Recent Labs  Lab 11/25/17 0438  TROPONINI 0.31*    RADIOLOGY:  Dg Chest Port 1 View  Result Date: 11/25/2017 CLINICAL DATA:  Respiratory failure EXAM: PORTABLE CHEST 1 VIEW COMPARISON:  11/24/2017 FINDINGS: Cardiac shadow is enlarged but  stable. The gastric catheter is noted extending towards the stomach. The endotracheal tube is again noted above the thoracic inlet and certainly could be advanced several cm. The lungs are clear bilaterally. This has improved significantly in the interval from the prior exam. No bony abnormality is noted. IMPRESSION: Significant improvement in vascular congestion and pulmonary edema. No focal infiltrate is seen. Tubes and lines as described. Electronically Signed   By: Alcide Clever M.D.   On: 11/25/2017 06:59   Dg Chest Port 1 View  Result Date: 11/24/2017 CLINICAL DATA:  Shortness of breath, post intubation EXAM: PORTABLE CHEST 1 VIEW COMPARISON:  05/30/2017 FINDINGS: Endotracheal tube tip possibly visible at the thoracic inlet. Poorly visible distal esophageal tubing, appears to course below the diaphragm but the tip is not well seen. Cardiomegaly with vascular congestion and moderate diffuse interstitial opacity concerning for pulmonary edema. No pneumothorax. IMPRESSION: 1. Tip of the endotracheal tube appears to be positioned at or slightly above the thoracic inlet 2. Poor visibility of the distal portion of the esophageal tube, appears to course below the diaphragm but tip is not included 3. Cardiomegaly with vascular congestion and moderate diffuse pulmonary edema Electronically Signed   By: Jasmine Pang M.D.   On: 11/24/2017 02:48   Dg Abd Portable 1v  Result Date: 11/24/2017 CLINICAL DATA:  OG tube placement. EXAM: PORTABLE ABDOMEN - 1 VIEW COMPARISON:  None. FINDINGS: Interval placement of an OG tube with the tip likely in the gastric antrum. Nonobstructive bowel gas pattern. The lung bases are clear. IMPRESSION: OG tube with the tip likely in the gastric antrum. Electronically Signed   By: Obie Dredge M.D.   On: 11/24/2017 16:53     Tora Kindred, DO  11/25/2017

## 2017-11-25 NOTE — Progress Notes (Signed)
Advanced tube to 27cm at the lip due to morning xray.

## 2017-11-25 NOTE — Progress Notes (Addendum)
[place] at [facility]                                                                                                                                                                                  Patient Demographics   Jorge Cruz, is a 67 y.o. male, DOB - January 29, 1951, VPX:[id number]  Admit date - 11/24/2017   Admitting Physician Harrie Foreman, MD  Outpatient Primary MD for the patient is [facility], [facility]   LOS - 1  Subjective: Patient now intubated    Review of Systems:   CONSTITUTIONAL: Intubated Vitals:   Vitals:   11/25/17 0800 11/25/17 0900 11/25/17 1346 11/25/17 1401  BP: 121/84 107/74    Pulse: 88 83    Resp: (!) 30 (!) 30    Temp: 98.8 F (37.1 C) 98.6 F (37 C)    TempSrc:      SpO2: 99% 99% 99% 98%  Weight:      Height:        Wt Readings from Last 3 Encounters:  11/24/17 222 lb 10.6 oz (101 kg)  09/13/17 224 lb 6 oz (101.8 kg)  07/26/17 222 lb 4 oz (100.8 kg)     Intake/Output Summary (Last 24 hours) at 11/25/2017 1434 Last data filed at 11/25/2017 0700 Gross per 24 hour  Intake 1103.12 ml  Output 650 ml  Net 453.12 ml    Physical Exam:   GENERAL: Critically ill-appearing HEAD, EYES, EARS, NOSE AND THROAT: Atraumatic, normocephalic. Extraocular muscles are intact. Pupils equal and reactive to light. Sclerae anicteric. No conjunctival injection. No oro-pharyngeal erythema.  NECK: Supple. There is no jugular venous distention. No bruits, no lymphadenopathy, no thyromegaly.  HEART: Regular rate and rhythm,. No murmurs, no rubs, no clicks.  LUNGS: Clear to auscultation bilaterally. No rales or rhonchi. No wheezes.  On the vent ABDOMEN: Soft, flat, nontender, nondistended. Has good bowel sounds. No hepatosplenomegaly appreciated.  EXTREMITIES: No evidence of any cyanosis, clubbing, or peripheral edema.  +2 pedal and radial pulses bilaterally.  NEUROLOGIC: Sedated SKIN: Moist and warm with no rashes  appreciated.  Psych: Sedated LN: No inguinal LN enlargement    Antibiotics   Anti-infectives (From admission, onward)   None      Medications   Scheduled Meds: . chlorhexidine gluconate (MEDLINE KIT)  15 mL Mouth Rinse BID  . docusate  100 mg Per Tube BID  . enoxaparin (LOVENOX) injection  40 mg Subcutaneous Q24H  . famotidine  20 mg Per Tube Daily  . feeding supplement (PRO-STAT SUGAR FREE 64)  60 mL Per Tube QID  . insulin aspart  0-9 Units Subcutaneous Q4H  . insulin glargine  30 Units Subcutaneous Q24H  . ipratropium-albuterol  3 mL Nebulization Q6H  . mouth rinse  15 mL Mouth Rinse 10 times per day  . multivitamin  15 mL Per Tube q1800   Continuous Infusions: . feeding supplement (VITAL HIGH PROTEIN) 1,000 mL (11/24/17 1717)  . propofol (DIPRIVAN) infusion 50 mcg/kg/min (11/25/17 0717)   PRN Meds:.acetaminophen **OR** acetaminophen, fentaNYL (SUBLIMAZE) injection, ondansetron **OR** ondansetron (ZOFRAN) IV   Data Review:   Micro Results Recent Results (from the past 240 hour(s))  MRSA PCR Screening     Status: None   Collection Time: 11/24/17  4:31 AM  Result Value Ref Range Status   MRSA by PCR NEGATIVE NEGATIVE Final    Comment:        The GeneXpert MRSA Assay (FDA approved for NASAL specimens only), is one component of a comprehensive MRSA colonization surveillance program. It is not intended to diagnose MRSA infection nor to guide or monitor treatment for MRSA infections. Performed at [facility], [address], [place], [place] [address]     Radiology Reports Dg Chest [place] 1 View  Result Date: 11/25/2017 CLINICAL DATA:  Respiratory failure EXAM: PORTABLE CHEST 1 VIEW COMPARISON:  11/24/2017 FINDINGS: Cardiac shadow is enlarged but stable. The gastric catheter is noted extending towards the stomach. The endotracheal tube is again noted above the thoracic inlet and certainly could be advanced several cm. The lungs are clear bilaterally.  This has improved significantly in the interval from the prior exam. No bony abnormality is noted. IMPRESSION: Significant improvement in vascular congestion and pulmonary edema. No focal infiltrate is seen. Tubes and lines as described. Electronically Signed   By: Inez Catalina M.D.   On: 11/25/2017 06:59   Dg Chest Port 1 View  Result Date: 11/24/2017 CLINICAL DATA:  Shortness of breath, post intubation EXAM: PORTABLE CHEST 1 VIEW COMPARISON:  05/30/2017 FINDINGS: Endotracheal tube tip possibly visible at the thoracic inlet. Poorly visible distal esophageal tubing, appears to course below the diaphragm but the tip is not well seen. Cardiomegaly with vascular congestion and moderate diffuse interstitial opacity concerning for pulmonary edema. No pneumothorax. IMPRESSION: 1. Tip of the endotracheal tube appears to be positioned at or slightly above the thoracic inlet 2. Poor visibility of the distal portion of the esophageal tube, appears to course below the diaphragm but tip is not included 3. Cardiomegaly with vascular congestion and moderate diffuse pulmonary edema Electronically Signed   By: Donavan Foil M.D.   On: 11/24/2017 02:48   Dg Abd Portable 1v  Result Date: 11/24/2017 CLINICAL DATA:  OG tube placement. EXAM: PORTABLE ABDOMEN - 1 VIEW COMPARISON:  None. FINDINGS: Interval placement of an OG tube with the tip likely in the gastric antrum. Nonobstructive bowel gas pattern. The lung bases are clear. IMPRESSION: OG tube with the tip likely in the gastric antrum. Electronically Signed   By: Titus Dubin M.D.   On: 11/24/2017 16:53     CBC Recent Labs  Lab 11/24/17 0202  WBC 7.8  HGB 13.5  HCT 41.3  PLT 226  MCV 97.2  MCH 31.7  MCHC 32.6  RDW 16.7*  LYMPHSABS 4.3*  MONOABS 0.6  EOSABS 0.1  BASOSABS 0.1    Chemistries  Recent Labs  Lab 11/24/17 0202 11/25/17 0438  NA 141 140  K 4.1 4.9  CL 107 109  CO2 25 22  GLUCOSE 358* 133*  BUN 36* 59*  CREATININE 1.62* 1.71*   CALCIUM 8.7* 8.7*  MG  --  2.0   ------------------------------------------------------------------------------------------------------------------ estimated creatinine clearance is 50.6 mL/min (A) (by C-G formula based on SCr of 1.71 mg/dL (H)). ------------------------------------------------------------------------------------------------------------------ No results for input(s): HGBA1C in the last 72 hours. ------------------------------------------------------------------------------------------------------------------ Recent Labs    11/24/17 0457  TRIG 160*   ------------------------------------------------------------------------------------------------------------------ Recent Labs    11/24/17 0457  TSH 0.940   ------------------------------------------------------------------------------------------------------------------ No results for input(s): VITAMINB12, FOLATE, FERRITIN, TIBC, IRON, RETICCTPCT in the last 72 hours.  Coagulation profile No results for input(s): INR, PROTIME in the last 168 hours.  No results for input(s): DDIMER in the last 72 hours.  Cardiac Enzymes Recent Labs  Lab 11/24/17 1049 11/24/17 1402 11/25/17 0438  TROPONINI 0.30* 0.13* 0.31*   ------------------------------------------------------------------------------------------------------------------ Invalid input(s): POCBNP    Assessment & Plan   This is a 67 year old male admitted for acute respiratory failure with hypoxia. 1.  Respiratory failure: Acute; with hypoxia due to pulmonary edema secondary to acute on chronic systolic heart failure.  Repeat echo shows EF 25% Continue ventilator support 2.  Elevated troponin: Likely due to demand ischemia follow troponin levels 3.  COPD: Continue nebulizer therapy 4.  Diabetes mellitus type 2: Continue basal insulin therapy as well as sliding scale insulin while hospitalized.   5.  DVT prophylaxis: Lovenox 6.  GI prophylaxis: Initiate PPI  24 hours after intubation The patient is a full code.        Code Status Orders  (From admission, onward)        Start     Ordered   11/24/17 0334  Full code  Continuous     11/24/17 0333    Code Status History    Date Active Date Inactive Code Status Order ID Comments User Context   05/31/2017 0252 06/01/2017 2203 Full Code [id number]  Harrie Foreman, MD Inpatient   03/31/2016 2359 04/01/2016 1247 Full Code [id number]  Lance Coon, MD Inpatient           Consults intensivist  DVT Prophylaxis  Lovenox   Lab Results  Component Value Date   PLT 226 11/24/2017     Time Spent in minutes 35 minutes  Dustin Flock M.D on 11/25/2017 at 2:34 PM  Between 7am to 6pm - Pager - [phone]  After 6pm go to [email or web address] - Proofreader  [employer]   Office  [phone]

## 2017-11-25 NOTE — Progress Notes (Signed)
Patient has been sedated most of the shift. Gets agitated when sedation turned down. NP added svn treatment earlier in shift for wheezing. Has been suctioned on and off for increased secretions.

## 2017-11-26 ENCOUNTER — Inpatient Hospital Stay: Payer: Medicare Other

## 2017-11-26 LAB — CBC
HCT: 38.3 % — ABNORMAL LOW (ref 40.0–52.0)
HCT: 37.4 % — ABNORMAL LOW (ref 40.0–52.0)
Hemoglobin: 12.5 g/dL — ABNORMAL LOW (ref 13.0–18.0)
Hemoglobin: 12.3 g/dL — ABNORMAL LOW (ref 13.0–18.0)
MCH: 31.2 pg (ref 26.0–34.0)
MCH: 31.3 pg (ref 26.0–34.0)
MCHC: 32.8 g/dL (ref 32.0–36.0)
MCHC: 32.9 g/dL (ref 32.0–36.0)
MCV: 95.1 fL (ref 80.0–100.0)
MCV: 95.1 fL (ref 80.0–100.0)
Platelets: 148 10*3/uL — ABNORMAL LOW (ref 150–440)
Platelets: 127 10*3/uL — ABNORMAL LOW (ref 150–440)
RBC: 4.02 MIL/uL — ABNORMAL LOW (ref 4.40–5.90)
RBC: 3.94 MIL/uL — ABNORMAL LOW (ref 4.40–5.90)
RDW: 16.2 % — ABNORMAL HIGH (ref 11.5–14.5)
RDW: 16.5 % — ABNORMAL HIGH (ref 11.5–14.5)
WBC: 7.5 10*3/uL (ref 3.8–10.6)
WBC: 8.4 10*3/uL (ref 3.8–10.6)

## 2017-11-26 LAB — BASIC METABOLIC PANEL
Anion gap: 6 (ref 5–15)
Anion gap: 7 (ref 5–15)
BUN: 62 mg/dL — ABNORMAL HIGH (ref 6–20)
BUN: 51 mg/dL — ABNORMAL HIGH (ref 6–20)
CO2: 23 mmol/L (ref 22–32)
CO2: 26 mmol/L (ref 22–32)
Calcium: 9 mg/dL (ref 8.9–10.3)
Calcium: 8.7 mg/dL — ABNORMAL LOW (ref 8.9–10.3)
Chloride: 111 mmol/L (ref 101–111)
Chloride: 106 mmol/L (ref 101–111)
Creatinine, Ser: 1.69 mg/dL — ABNORMAL HIGH (ref 0.61–1.24)
Creatinine, Ser: 1.43 mg/dL — ABNORMAL HIGH (ref 0.61–1.24)
GFR calc Af Amer: 47 mL/min — ABNORMAL LOW (ref >60)
GFR calc Af Amer: 57 mL/min — ABNORMAL LOW (ref >60)
GFR calc non Af Amer: 41 mL/min — ABNORMAL LOW (ref >60)
GFR calc non Af Amer: 50 mL/min — ABNORMAL LOW (ref >60)
Glucose, Bld: 104 mg/dL — ABNORMAL HIGH (ref 65–99)
Glucose, Bld: 153 mg/dL — ABNORMAL HIGH (ref 65–99)
Potassium: 5.2 mmol/L — ABNORMAL HIGH (ref 3.5–5.1)
Potassium: 4 mmol/L (ref 3.5–5.1)
Sodium: 140 mmol/L (ref 135–145)
Sodium: 139 mmol/L (ref 135–145)

## 2017-11-26 LAB — MAGNESIUM: Magnesium: 2.2 mg/dL (ref 1.7–2.4)

## 2017-11-26 LAB — GLUCOSE, CAPILLARY
Glucose-Capillary: 106 mg/dL — ABNORMAL HIGH (ref 65–99)
Glucose-Capillary: 107 mg/dL — ABNORMAL HIGH (ref 65–99)
Glucose-Capillary: 121 mg/dL — ABNORMAL HIGH (ref 65–99)
Glucose-Capillary: 128 mg/dL — ABNORMAL HIGH (ref 65–99)
Glucose-Capillary: 160 mg/dL — ABNORMAL HIGH (ref 65–99)
Glucose-Capillary: 167 mg/dL — ABNORMAL HIGH (ref 65–99)
Glucose-Capillary: 92 mg/dL (ref 65–99)

## 2017-11-26 LAB — PROCALCITONIN: Procalcitonin: 2.04 ng/mL

## 2017-11-26 LAB — PHOSPHORUS: Phosphorus: 3.7 mg/dL (ref 2.5–4.6)

## 2017-11-26 MED ORDER — VANCOMYCIN HCL IN DEXTROSE 1-5 GM/200ML-% IV SOLN
1000.0000 mg | Freq: Once | INTRAVENOUS | Status: AC
  Administered 2017-11-26: 1000 mg via INTRAVENOUS
  Filled 2017-11-26: qty 200

## 2017-11-26 MED ORDER — SODIUM CHLORIDE 0.9 % IV SOLN
2.0000 g | Freq: Two times a day (BID) | INTRAVENOUS | Status: DC
  Administered 2017-11-26 – 2017-11-27 (×2): 2 g via INTRAVENOUS
  Filled 2017-11-26 (×5): qty 2

## 2017-11-26 MED ORDER — VANCOMYCIN HCL 10 G IV SOLR
1250.0000 mg | INTRAVENOUS | Status: DC
  Administered 2017-11-27 (×2): 1250 mg via INTRAVENOUS
  Filled 2017-11-26 (×3): qty 1250

## 2017-11-26 NOTE — Progress Notes (Signed)
[place] at [facility]                                                                                                                                                                                  Patient Demographics   Jorge Cruz, is a 67 y.o. male, DOB - [birth year], WLN:[id number]  Admit date - 11/24/2017   Admitting Physician Harrie Foreman, MD  Outpatient Primary MD for the patient is Proberta   LOS - 2  Subjective: Remains on the ventilator    Review of Systems:   CONSTITUTIONAL: Intubated Vitals:   Vitals:   11/26/17 1036 11/26/17 1111 11/26/17 1112 11/26/17 1344  BP:      Pulse:      Resp:      Temp:      TempSrc:      SpO2: 100% 99% 99% 96%  Weight:      Height:        Wt Readings from Last 3 Encounters:  11/24/17 222 lb 10.6 oz (101 kg)  09/13/17 224 lb 6 oz (101.8 kg)  07/26/17 222 lb 4 oz (100.8 kg)     Intake/Output Summary (Last 24 hours) at 11/26/2017 1417 Last data filed at 11/26/2017 0600 Gross per 24 hour  Intake 1151.29 ml  Output 1670 ml  Net -518.71 ml    Physical Exam:   GENERAL: Critically ill-appearing HEAD, EYES, EARS, NOSE AND THROAT: Atraumatic, normocephalic. Extraocular muscles are intact. Pupils equal and reactive to light. Sclerae anicteric. No conjunctival injection. No oro-pharyngeal erythema.  NECK: Supple. There is no jugular venous distention. No bruits, no lymphadenopathy, no thyromegaly.  HEART: Regular rate and rhythm,. No murmurs, no rubs, no clicks.  LUNGS: Clear to auscultation bilaterally. No rales or rhonchi. No wheezes.  On the vent ABDOMEN: Soft, flat, nontender, nondistended. Has good bowel sounds. No hepatosplenomegaly appreciated.  EXTREMITIES: No evidence of any cyanosis, clubbing, or peripheral edema.  +2 pedal and radial pulses bilaterally.  NEUROLOGIC: Sedated SKIN: Moist and warm with no rashes appreciated.  Psych: Sedated LN: No inguinal LN  enlargement    Antibiotics   Anti-infectives (From admission, onward)   None      Medications   Scheduled Meds: . chlorhexidine gluconate (MEDLINE KIT)  15 mL Mouth Rinse BID  . docusate  100 mg Per Tube BID  . enoxaparin (LOVENOX) injection  40 mg Subcutaneous Q24H  . famotidine  20 mg Per Tube Daily  . feeding supplement (PRO-STAT SUGAR FREE 64)  60 mL Per Tube QID  . insulin aspart  0-9 Units Subcutaneous Q4H  . insulin glargine  30 Units Subcutaneous Q24H  .  ipratropium-albuterol  3 mL Nebulization Q6H  . mouth rinse  15 mL Mouth Rinse 10 times per day  . multivitamin  15 mL Per Tube q1800   Continuous Infusions: . feeding supplement (VITAL HIGH PROTEIN) 15 mL/hr at 11/26/17 0600  . propofol (DIPRIVAN) infusion 50 mcg/kg/min (11/26/17 0917)   PRN Meds:.acetaminophen **OR** acetaminophen, fentaNYL (SUBLIMAZE) injection, ondansetron **OR** ondansetron (ZOFRAN) IV   Data Review:   Micro Results Recent Results (from the past 240 hour(s))  MRSA PCR Screening     Status: None   Collection Time: 11/24/17  4:31 AM  Result Value Ref Range Status   MRSA by PCR NEGATIVE NEGATIVE Final    Comment:        The GeneXpert MRSA Assay (FDA approved for NASAL specimens only), is one component of a comprehensive MRSA colonization surveillance program. It is not intended to diagnose MRSA infection nor to guide or monitor treatment for MRSA infections. Performed at [facility], [address] [place], [place] [address]   Culture, respiratory (NON-Expectorated)     Status: None (Preliminary result)   Collection Time: 11/26/17  9:29 AM  Result Value Ref Range Status   Specimen Description   Final    TRACHEAL ASPIRATE Performed at [facility], [address] [place], [place] [address]    Special Requests   Final    NONE Performed at [facility], [address] [place], [place] [address]    Gram Stain   Final    MODERATE WBC PRESENT,BOTH  PMN AND MONONUCLEAR NO SQUAMOUS EPITHELIAL CELLS SEEN ABUNDANT GRAM POSITIVE COCCI IN CLUSTERS Performed at [facility], [place] [address] [place], [place] [address]    Culture PENDING  Incomplete   Report Status PENDING  Incomplete    Radiology Reports Dg Chest Port 1 View  Result Date: 11/26/2017 CLINICAL DATA:  Respiratory failure. Patient with hypercapnic respiratory failure. Presently on mechanical ventilationHx of CHF, smoker, COPD, diabetes, NICM EXAM: PORTABLE CHEST 1 VIEW COMPARISON:  11/25/2017 FINDINGS: New endotracheal tube tip projects 3.5 cm above the Carina. Nasal/orogastric tube is stable passing below the diaphragm into the stomach. Lungs demonstrate vascular congestion and interstitial thickening, increased when compared to the prior exam. Cardiac silhouette is mildly enlarged. No convincing pleural effusion.  No pneumothorax. IMPRESSION: 1. Well-positioned endotracheal tube, tip 3.5 cm above the Carina. 2. Worsened lung aeration consistent with increased interstitial pulmonary edema. Electronically Signed   By: Lajean Manes M.D.   On: 11/26/2017 11:50   Dg Chest Port 1 View  Result Date: 11/25/2017 CLINICAL DATA:  Respiratory failure EXAM: PORTABLE CHEST 1 VIEW COMPARISON:  11/24/2017 FINDINGS: Cardiac shadow is enlarged but stable. The gastric catheter is noted extending towards the stomach. The endotracheal tube is again noted above the thoracic inlet and certainly could be advanced several cm. The lungs are clear bilaterally. This has improved significantly in the interval from the prior exam. No bony abnormality is noted. IMPRESSION: Significant improvement in vascular congestion and pulmonary edema. No focal infiltrate is seen. Tubes and lines as described. Electronically Signed   By: Inez Catalina M.D.   On: 11/25/2017 06:59   Dg Chest Port 1 View  Result Date: 11/24/2017 CLINICAL DATA:  Shortness of breath, post intubation EXAM: PORTABLE CHEST 1 VIEW COMPARISON:   05/30/2017 FINDINGS: Endotracheal tube tip possibly visible at the thoracic inlet. Poorly visible distal esophageal tubing, appears to course below the diaphragm but the tip is not well seen. Cardiomegaly with vascular congestion  and moderate diffuse interstitial opacity concerning for pulmonary edema. No pneumothorax. IMPRESSION: 1. Tip of the endotracheal tube appears to be positioned at or slightly above the thoracic inlet 2. Poor visibility of the distal portion of the esophageal tube, appears to course below the diaphragm but tip is not included 3. Cardiomegaly with vascular congestion and moderate diffuse pulmonary edema Electronically Signed   By: Donavan Foil M.D.   On: 11/24/2017 02:48   Dg Abd Portable 1v  Result Date: 11/24/2017 CLINICAL DATA:  OG tube placement. EXAM: PORTABLE ABDOMEN - 1 VIEW COMPARISON:  None. FINDINGS: Interval placement of an OG tube with the tip likely in the gastric antrum. Nonobstructive bowel gas pattern. The lung bases are clear. IMPRESSION: OG tube with the tip likely in the gastric antrum. Electronically Signed   By: Titus Dubin M.D.   On: 11/24/2017 16:53     CBC Recent Labs  Lab 11/24/17 0202 11/26/17 0628  WBC 7.8 7.5  HGB 13.5 12.5*  HCT 41.3 38.3*  PLT 226 148*  MCV 97.2 95.1  MCH 31.7 31.2  MCHC 32.6 32.8  RDW 16.7* 16.2*  LYMPHSABS 4.3*  --   MONOABS 0.6  --   EOSABS 0.1  --   BASOSABS 0.1  --     Chemistries  Recent Labs  Lab 11/24/17 0202 11/25/17 0438 11/26/17 0522  NA 141 140 140  K 4.1 4.9 5.2*  CL 107 109 111  CO2 '25 22 23  ' GLUCOSE 358* 133* 104*  BUN 36* 59* 62*  CREATININE 1.62* 1.71* 1.69*  CALCIUM 8.7* 8.7* 9.0  MG  --  2.0  --    ------------------------------------------------------------------------------------------------------------------ estimated creatinine clearance is 51.2 mL/min (A) (by C-G formula based on SCr of 1.69 mg/dL  (H)). ------------------------------------------------------------------------------------------------------------------ No results for input(s): HGBA1C in the last 72 hours. ------------------------------------------------------------------------------------------------------------------ Recent Labs    11/24/17 0457  TRIG 160*   ------------------------------------------------------------------------------------------------------------------ Recent Labs    11/24/17 0457  TSH 0.940   ------------------------------------------------------------------------------------------------------------------ No results for input(s): VITAMINB12, FOLATE, FERRITIN, TIBC, IRON, RETICCTPCT in the last 72 hours.  Coagulation profile No results for input(s): INR, PROTIME in the last 168 hours.  No results for input(s): DDIMER in the last 72 hours.  Cardiac Enzymes Recent Labs  Lab 11/24/17 1049 11/24/17 1402 11/25/17 0438  TROPONINI 0.30* 0.13* 0.31*   ------------------------------------------------------------------------------------------------------------------ Invalid input(s): POCBNP    Assessment & Plan   This is a 67 year old male admitted for acute respiratory failure with hypoxia. 1.  Respiratory failure: Acute; with hypoxia due to pulmonary edema secondary to acute on chronic systolic heart failure.  Repeat echo shows EF 25% that is significantly dropped cardiology consult Continue ventilator support 2.  Elevated troponin: Likely due to demand ischemia follow troponin levels 3.  COPD: Continue nebulizer therapy 4.  Diabetes mellitus type 2: Continue basal insulin therapy as well as sliding scale insulin while hospitalized.   5.  DVT prophylaxis: Lovenox 6.  GI prophylaxis: Initiate PPI 24 hours after intubation The patient is a full code.        Code Status Orders  (From admission, onward)        Start     Ordered   11/24/17 0334  Full code  Continuous     11/24/17  0333    Code Status History    Date Active Date Inactive Code Status Order ID Comments User Context   05/31/2017 0252 06/01/2017 2203 Full Code [id number]  Harrie Foreman, MD Inpatient   03/31/2016 [id number]  04/01/2016 1247 Full Code [id number]  Lance Coon, MD Inpatient           Consults intensivist  DVT Prophylaxis  Lovenox   Lab Results  Component Value Date   PLT 148 (L) 11/26/2017     Time Spent in minutes 35 minutes  Dustin Flock M.D on 11/26/2017 at 2:17 PM  Between 7am to 6pm - Pager - [phone]  After 6pm go to [email or web address] - Proofreader  [employer]   Office  [phone]

## 2017-11-26 NOTE — Progress Notes (Signed)
Decreased FIO2 to 30% due to sats of 98-99% on 50%.

## 2017-11-26 NOTE — Plan of Care (Signed)
Pt. RASS: -4, with intermittent periods of stacking breaths & high pressures on ventilator making titration of sedation challenging. VSS, UOP > 50 mL/h, vent settings remain the same. No care concerns at this time.

## 2017-11-26 NOTE — Progress Notes (Signed)
Wake up assessment started.  Sedation cut in half.

## 2017-11-26 NOTE — Progress Notes (Signed)
Pharmacy Antibiotic Note  Jorge Cruz is a 67 y.o. male admitted on 11/24/2017 with bacteremia.  Pharmacy has been consulted for cefepime and vancomyicn dosing. MRSA PCR negative 4/5, however patient has been hospitalized since.   Plan: Cefepime 2 g iv q 12 hours.   Vancomycin 1000 mg iv once followed by 1250 mg iv q 18 hours with stacked dosing. Will check levels and adjust dosing as clinically indicated.   Height: 5\' 10"  (177.8 cm) Weight: 222 lb 10.6 oz (101 kg) IBW/kg (Calculated) : 73  Temp (24hrs), Avg:100.8 F (38.2 C), Min:99.5 F (37.5 C), Max:101.8 F (38.8 C)  Recent Labs  Lab 11/24/17 0202 11/25/17 0438 11/26/17 0522 11/26/17 0628  WBC 7.8  --   --  7.5  CREATININE 1.62* 1.71* 1.69*  --     Estimated Creatinine Clearance: 51.2 mL/min (A) (by C-G formula based on SCr of 1.69 mg/dL (H)).    No Known Allergies  Antimicrobials this admission: Cefepime 4/7 >>  Vancomycin 4/7  >>   Dose adjustments this admission:   Microbiology results: 4/7 BCx: sent 4/7 TA: pending  4/5 MRSA PCR: negative  Thank you for allowing pharmacy to be a part of this patient's care.  Valentina Gu 11/26/2017 8:35 PM

## 2017-11-26 NOTE — Progress Notes (Signed)
Pt failed wake up assessment.  He became Tachycardic 130's, Tachyapenic >32 bpm, and desating to 89%.  Conforti DO, aware and reports to abandon wake up assessment and pt patients vent back on a rate.  RRT at bedside.

## 2017-11-26 NOTE — Progress Notes (Signed)
[facility] Critical Care Medicine Progess Note    SYNOPSIS   67 year old gentleman with a past medical history remarkable for heart failure with reduced ejection fraction, last measured as 20-25%, COPD, diabetes, hypertension, polysubstance abuse,tobacco abuse was brought into the emergency department by EMS secondary to acute onset of shortness of breath. He initially tried inhalers without response. Patient was giving bronchodilators by EMS and in the emergency department. Was also given Solu-Medrol without any success. Chest x-ray revealed pulmonary edema pattern.  ASSESSMENT/PLAN   Respiratory failure. Patient with hypercapnic respiratory failure. Presently on mechanical ventilation. Chest x-ray did not show clear pneumonia more consistent with cardiomegaly and heart failure.  Chest x-ray pending from this morning   Renal insufficiency. BUN and creatinine  this morning is 62 and 1.69 which is essentially stable..   Hyperglycemia. Placed on sliding scale with improved glucoses  We will perform spontaneous awakening and breathing trial and assess for extubation today   VENTILATOR SETTINGS: Vent Mode: PRVC FiO2 (%):  [30 %] 30 % Set Rate:  [30 bmp] 30 bmp Vt Set:  [500 mL] 500 mL PEEP:  [8 cmH20] 8 cmH20 Plateau Pressure:  [22 cmH20-25 cmH20] 25 cmH20  CINTAKE / OUTPUT:  Intake/Output Summary (Last 24 hours) at 11/26/2017 0744 Last data filed at 11/26/2017 0600 Gross per 24 hour  Intake 1151.29 ml  Output 1670 ml  Net -518.71 ml    Name: Jorge Cruz MRN: [id number] DOB: 1951/01/27    ADMISSION DATE:  11/24/2017   SUBJECTIVE:   Pt currently on the ventilator, can not provide history or review of systems.   VITAL SIGNS: Temp:  [98.4 F (36.9 C)-101.7 F (38.7 C)] 99.5 F (37.5 C) (04/07 0600) Pulse Rate:  [64-112] 86 (04/07 0600) Resp:  [21-30] 30 (04/07 0600) BP: (73-166)/(50-97) 119/68 (04/07 0600) SpO2:  [89 %-100 %] 89 % (04/07 0600) FiO2 (%):  [30 %] 30 %  (04/07 0400)   PHYSICAL EXAMINATION: Physical Examination:   VS: BP 119/68   Pulse 86   Temp 99.5 F (37.5 C)   Resp (!) 30   Ht  (1.778 m)   Wt 101 kg (222 lb 10.6 oz)   SpO2 (!) 89%   BMI 31.95 kg/m   General Appearance: No distress  Neuro:patient is presently sedated on propofol, limited exam at this time HEENT: oral endotracheal tube in place, oral gastric tube Pulmonary: normal breath sounds   CardiovascularNormal S1,S2.  No m/r/g.   Abdomen: positive bowel sounds, soft exam. Skin:   warm, no rashes, no ecchymosis  Extremities: normal, no cyanosis, clubbing.    LABORATORY PANEL:   CBC Recent Labs  Lab 11/26/17 0628  WBC 7.5  HGB 12.5*  HCT 38.3*  PLT 148*    Chemistries  Recent Labs  Lab 11/25/17 0438 11/26/17 0522  NA 140 140  K 4.9 5.2*  CL 109 111  CO2 22 23  GLUCOSE 133* 104*  BUN 59* 62*  CREATININE 1.71* 1.69*  CALCIUM 8.7* 9.0  MG 2.0  --   PHOS 4.8*  --     Recent Labs  Lab 11/25/17 1145 11/25/17 1632 11/25/17 1956 11/26/17 0012 11/26/17 0427 11/26/17 0728  GLUCAP 127* 91 94 167* 106* 92   Recent Labs  Lab 11/24/17 0313 11/24/17 0614 11/25/17 0502  PHART 7.12* 7.31* 7.31*  PCO2ART 81* 49* 45  PO2ART 83 264* 104   No results for input(s): AST, ALT, ALKPHOS, BILITOT, ALBUMIN in the last 168 hours.  Cardiac Enzymes Recent  Labs  Lab 11/25/17 0438  TROPONINI 0.31*    RADIOLOGY:  Dg Chest Port 1 View  Result Date: 11/25/2017 CLINICAL DATA:  Respiratory failure EXAM: PORTABLE CHEST 1 VIEW COMPARISON:  11/24/2017 FINDINGS: Cardiac shadow is enlarged but stable. The gastric catheter is noted extending towards the stomach. The endotracheal tube is again noted above the thoracic inlet and certainly could be advanced several cm. The lungs are clear bilaterally. This has improved significantly in the interval from the prior exam. No bony abnormality is noted. IMPRESSION: Significant improvement in vascular congestion and  pulmonary edema. No focal infiltrate is seen. Tubes and lines as described. Electronically Signed   By: Alcide Clever M.D.   On: 11/25/2017 06:59   Dg Abd Portable 1v  Result Date: 11/24/2017 CLINICAL DATA:  OG tube placement. EXAM: PORTABLE ABDOMEN - 1 VIEW COMPARISON:  None. FINDINGS: Interval placement of an OG tube with the tip likely in the gastric antrum. Nonobstructive bowel gas pattern. The lung bases are clear. IMPRESSION: OG tube with the tip likely in the gastric antrum. Electronically Signed   By: Obie Dredge M.D.   On: 11/24/2017 16:53     Tora Kindred, DO  11/26/2017

## 2017-11-27 ENCOUNTER — Inpatient Hospital Stay: Payer: Medicare Other

## 2017-11-27 ENCOUNTER — Encounter: Payer: Self-pay | Admitting: Nurse Practitioner

## 2017-11-27 DIAGNOSIS — I5023 Acute on chronic systolic (congestive) heart failure: Secondary | ICD-10-CM

## 2017-11-27 LAB — GLUCOSE, CAPILLARY
Glucose-Capillary: 112 mg/dL — ABNORMAL HIGH (ref 65–99)
Glucose-Capillary: 122 mg/dL — ABNORMAL HIGH (ref 65–99)
Glucose-Capillary: 130 mg/dL — ABNORMAL HIGH (ref 65–99)
Glucose-Capillary: 187 mg/dL — ABNORMAL HIGH (ref 65–99)
Glucose-Capillary: 193 mg/dL — ABNORMAL HIGH (ref 65–99)
Glucose-Capillary: 209 mg/dL — ABNORMAL HIGH (ref 65–99)

## 2017-11-27 LAB — PROCALCITONIN: Procalcitonin: 1.9 ng/mL

## 2017-11-27 LAB — TRIGLYCERIDES: Triglycerides: 363 mg/dL — ABNORMAL HIGH (ref <150)

## 2017-11-27 MED ORDER — ENOXAPARIN SODIUM 40 MG/0.4ML ~~LOC~~ SOLN
40.0000 mg | SUBCUTANEOUS | Status: DC
  Administered 2017-11-27 – 2017-11-29 (×3): 40 mg via SUBCUTANEOUS
  Filled 2017-11-27 (×4): qty 0.4

## 2017-11-27 MED ORDER — ACETAMINOPHEN 325 MG PO TABS
650.0000 mg | ORAL_TABLET | Freq: Four times a day (QID) | ORAL | Status: DC | PRN
  Administered 2017-11-28: 650 mg
  Filled 2017-11-27: qty 2

## 2017-11-27 MED ORDER — IPRATROPIUM-ALBUTEROL 0.5-2.5 (3) MG/3ML IN SOLN
RESPIRATORY_TRACT | Status: AC
  Administered 2017-11-27: 3 mL
  Filled 2017-11-27: qty 3

## 2017-11-27 MED ORDER — CARVEDILOL 3.125 MG PO TABS
3.1250 mg | ORAL_TABLET | Freq: Two times a day (BID) | ORAL | Status: DC
  Administered 2017-11-27 – 2017-11-29 (×5): 3.125 mg
  Filled 2017-11-27 (×5): qty 1

## 2017-11-27 MED ORDER — FUROSEMIDE 10 MG/ML IJ SOLN
40.0000 mg | Freq: Once | INTRAMUSCULAR | Status: AC
  Administered 2017-11-27: 40 mg via INTRAVENOUS
  Filled 2017-11-27: qty 4

## 2017-11-27 MED ORDER — NITROGLYCERIN 0.3 MG/HR TD PT24
0.3000 mg | MEDICATED_PATCH | Freq: Every day | TRANSDERMAL | Status: DC
  Administered 2017-11-27 – 2017-11-30 (×4): 0.3 mg via TRANSDERMAL
  Filled 2017-11-27 (×5): qty 1

## 2017-11-27 MED ORDER — POTASSIUM CHLORIDE 20 MEQ/15ML (10%) PO SOLN
20.0000 meq | Freq: Once | ORAL | Status: AC
  Administered 2017-11-27: 20 meq
  Filled 2017-11-27: qty 15

## 2017-11-27 MED ORDER — ACETAMINOPHEN 650 MG RE SUPP
650.0000 mg | Freq: Four times a day (QID) | RECTAL | Status: DC | PRN
  Administered 2017-11-27 – 2017-11-28 (×2): 650 mg via RECTAL
  Filled 2017-11-27 (×2): qty 1

## 2017-11-27 NOTE — Consult Note (Signed)
Cardiology Consult    Patient ID: Jorge Cruz MRN: [id number], DOB/AGE: [birth year]   Admit date: 11/24/2017 Date of Consult: 11/27/2017  Primary Physician: [facility] Primary Cardiologist: Nelva Bush, MD Requesting Provider: Chauncey Cruel. Posey Pronto, MD  Patient Profile    Jorge Cruz is a 67 y.o. male with a history of NICM, combined syst/diast CHF, CKD III, HTN, DM, COPD, tob abuse, polysubstance abuse, noncompliance, and non-obstructive CAD, who is being seen today for the evaluation of CHF at the request of Dr. Posey Pronto.  Past Medical History   Past Medical History:  Diagnosis Date  . Chronic combined systolic (congestive) and diastolic (congestive) heart failure ([place])    a. 03/2014 Echo: EF 45%, glob HK; b. 03/2016 Echo: EF 20-25%, diff HK, Gr1 DD, mild MR; b. 11/2017 Echo: EF 20%, diff HK, Gr2 DD, mildly to mod reduced RV fxn, PASP 75mHg.  . CKD (chronic kidney disease), stage III ([place]   . COPD (chronic obstructive pulmonary disease) ([facility]   . Diabetes mellitus without complication ([place]   . Hypertensive heart disease   . NICM (nonischemic cardiomyopathy) ([place]    a. 03/2014 Echo: EF 45%, glob HK, mild conc LVH; b. 03/2014 MV: possible mild ischemia, superimposed on small inf infarct, EF 36%; c. 03/2016 MV: EF <30%, no ischemia; d. 03/2016 Echo: EF 20-25%, diff HK, Gr1 DD, mild MR; e. 05/2017 Cath: nonobs dzs, EF 20%; f. 11/2017 Echo: EF 20%, diff HK, Gr2 DD.  . Non-obstructive CAD (coronary artery disease)    a. 05/2017 Cath: LM nl, LAD 375mLCX 3088mCA 48m32m 20%.  . Polysubstance abuse (HCC)[place]. Tobacco abuse     Past Surgical History:  Procedure Laterality Date  . RIGHT/LEFT HEART CATH AND CORONARY ANGIOGRAPHY N/A 06/01/2017   Procedure: RIGHT/LEFT HEART CATH AND CORONARY ANGIOGRAPHY;  Surgeon: AridWellington Hampshire;  [facility];  Service: Cardiovascular;  Laterality: N/A;     Allergies  No Known Allergies  History of Present Illness    Jorge Cruz 66 y45. male with a history of NICM, combined syst/diast CHF, CKD III, HTN, DM, COPD, tob abuse, polysubstance abuse, noncompliance, and non-obstructive CAD.  Cardiac history dates back to 2015, when he was first noted to have LV dysfunction in the setting of CHF.  Stress testing at that time showed small inferior infarct with questionably mild ischemia.  He was medically managed.  Cardiomyopathy was felt to be secondary to crack cocaine abuse.  Over the years, he has had intermittent compliance.  [facility] elevated troponin.  Echo showed EF of 20-25% with diffuse hypokinesis at that time and stress testing was again nonischemic.  He was most recently admitted in October 2018 with progressive CHF in the setting of noncompliance.  Following diuresis, he underwent right and left heart cardiac catheterization which revealed nonobstructive CAD and elevated filling pressures.  He was subsequently discharged and followed up a few times in heart failure clinic.  He was last seen in January, at which time he was stable.  Per notes, patient was admitted on April 5 following presentation  with  acute onset of dyspnea per the patient's wife.  On the morning of admission, EMS was called.  There is mention of him receiving IV epinephrine though apparently he did not require CPR.  He was taken to the emergency department where he was provided with nebulizers and IV steroids but due  to progressive respiratory failure, he required intubation.  Chest x-ray showed pulmonary edema and he was treated with IV Lasix in the ER.  He was subsequently admitted to ICU where he has remained intubated and sedated.  Echocardiogram this admission again shows an EF of 20% with diffuse hypokinesis and grade 2 diastolic dysfunction.  In the setting of respiratory failure, troponin has been mildly elevated with a peak of 0.31.  Daily chest x-rays has shown some improvement in pulmonary  edema.  Pro-calcitonin has been elevated at 2.04 and subsequently 1.90 and he has been on vancomycin and cefepime over the past 2 days.  His last dose of Lasix was April 5.  Cardiology has been asked to evaluate.    Inpatient Medications    . chlorhexidine gluconate (MEDLINE KIT)  15 mL Mouth Rinse BID  . docusate  100 mg Per Tube BID  . enoxaparin (LOVENOX) injection  40 mg Subcutaneous Q24H  . famotidine  20 mg Per Tube Daily  . feeding supplement (PRO-STAT SUGAR FREE 64)  60 mL Per Tube QID  . insulin aspart  0-9 Units Subcutaneous Q4H  . insulin glargine  30 Units Subcutaneous Q24H  . ipratropium-albuterol  3 mL Nebulization Q6H  . mouth rinse  15 mL Mouth Rinse 10 times per day  . multivitamin  15 mL Per Tube q1800    Family History    Family History  Problem Relation Age of Onset  . Diabetes Mother   . Diabetes Father    indicated that his mother is deceased. He indicated that his father is deceased.   Social History    Social History   Socioeconomic History  . Marital status: Married    Spouse name: Not on file  . Number of children: Not on file  . Years of education: Not on file  . Highest education level: Not on file  Occupational History  . Not on file  Social Needs  . Financial resource strain: Not on file  . Food insecurity:    Worry: Not on file    Inability: Not on file  . Transportation needs:    Medical: Not on file    Non-medical: Not on file  Tobacco Use  . Smoking status: Current Every Day Smoker    Packs/day: 1.00    Years: 50.00    Pack years: 50.00  . Smokeless tobacco: Never Used  Substance and Sexual Activity  . Alcohol use: No  . Drug use: No  . Sexual activity: Not on file  Lifestyle  . Physical activity:    Days per week: Not on file    Minutes per session: Not on file  . Stress: Not on file  Relationships  . Social connections:    Talks on phone: Not on file    Gets together: Not on file    Attends religious service: Not on  file    Active member of club or organization: Not on file    Attends meetings of clubs or organizations: Not on file    Relationship status: Not on file  . Intimate partner violence:    Fear of current or ex partner: Not on file    Emotionally abused: Not on file    Physically abused: Not on file    Forced sexual activity: Not on file  Other Topics Concern  . Not on file  Social History Narrative  . Not on file     Review of Systems    Pt intubated  and sedated and unable to provide a review of systems.  Physical Exam    Blood pressure 122/75, pulse 82, temperature 100 F (37.8 C), resp. rate (!) 26, height _0  (1.778 m), weight 222 lb 10.6 oz (101 kg), SpO2 98 %.  General: Intubated, sedated. No apparent acute distress. Psych: Sedated and unresponsive. Neuro: Sedated and unresponsive. HEENT: Normal  Neck: Supple without bruits or JVD. Lungs:  Resp regular and unlabored, coarse breath sounds throughout. Heart: RRR,distant, no s3, s4, or murmurs. Abdomen: Soft, non-tender, non-distended, BS + x 4.  Extremities: No clubbing, cyanosis or edema. DP/PT/Radials 2+ and equal bilaterally.  Labs    Recent Labs    11/24/17 1049 11/24/17 1402 11/25/17 0438  TROPONINI 0.30* 0.13* 0.31*   Lab Results  Component Value Date   WBC 8.4 11/26/2017   HGB 12.3 (L) 11/26/2017   HCT 37.4 (L) 11/26/2017   MCV 95.1 11/26/2017   PLT 127 (L) 11/26/2017    Recent Labs  Lab 11/26/17 2045  NA 139  K 4.0  CL 106  CO2 26  BUN 51*  CREATININE 1.43*  CALCIUM 8.7*  GLUCOSE 153*   Lab Results  Component Value Date   CHOL 151 04/01/2016   HDL 46 04/01/2016   LDLCALC 99 04/01/2016   TRIG 363 (H) 11/27/2017    Radiology Studies    Dg Chest 1 View  Result Date: 11/27/2017 CLINICAL DATA:  67 y/o  M; pulmonary edema. EXAM: CHEST  1 VIEW COMPARISON:  11/26/2017 chest radiograph FINDINGS: Stable cardiomegaly given projection and technique. Stable endotracheal tube 3.8 cm above the  carina. Diminished interstitial pulmonary edema. No focal consolidation. No pleural effusion or pneumothorax. Bones are unremarkable. IMPRESSION: Diminished interstitial pulmonary edema. No new consolidation. Stable endotracheal tube. Electronically Signed   By: Kristine Garbe M.D.   On: 11/27/2017 04:38   Dg Chest Port 1 View  Result Date: 11/26/2017 CLINICAL DATA:  Respiratory failure. Patient with hypercapnic respiratory failure. Presently on mechanical ventilationHx of CHF, smoker, COPD, diabetes, NICM EXAM: PORTABLE CHEST 1 VIEW COMPARISON:  11/25/2017 FINDINGS: New endotracheal tube tip projects 3.5 cm above the Carina. Nasal/orogastric tube is stable passing below the diaphragm into the stomach. Lungs demonstrate vascular congestion and interstitial thickening, increased when compared to the prior exam. Cardiac silhouette is mildly enlarged. No convincing pleural effusion.  No pneumothorax. IMPRESSION: 1. Well-positioned endotracheal tube, tip 3.5 cm above the Carina. 2. Worsened lung aeration consistent with increased interstitial pulmonary edema. Electronically Signed   By: Lajean Manes M.D.   On: 11/26/2017 11:50   Dg Chest Port 1 View  Result Date: 11/25/2017 CLINICAL DATA:  Respiratory failure EXAM: PORTABLE CHEST 1 VIEW COMPARISON:  11/24/2017 FINDINGS: Cardiac shadow is enlarged but stable. The gastric catheter is noted extending towards the stomach. The endotracheal tube is again noted above the thoracic inlet and certainly could be advanced several cm. The lungs are clear bilaterally. This has improved significantly in the interval from the prior exam. No bony abnormality is noted. IMPRESSION: Significant improvement in vascular congestion and pulmonary edema. No focal infiltrate is seen. Tubes and lines as described. Electronically Signed   By: Inez Catalina M.D.   On: 11/25/2017 06:59   Dg Chest Port 1 View  Result Date: 11/24/2017 CLINICAL DATA:  Shortness of breath, post  intubation EXAM: PORTABLE CHEST 1 VIEW COMPARISON:  05/30/2017 FINDINGS: Endotracheal tube tip possibly visible at the thoracic inlet. Poorly visible distal esophageal tubing, appears to course below the diaphragm but  the tip is not well seen. Cardiomegaly with vascular congestion and moderate diffuse interstitial opacity concerning for pulmonary edema. No pneumothorax. IMPRESSION: 1. Tip of the endotracheal tube appears to be positioned at or slightly above the thoracic inlet 2. Poor visibility of the distal portion of the esophageal tube, appears to course below the diaphragm but tip is not included 3. Cardiomegaly with vascular congestion and moderate diffuse pulmonary edema Electronically Signed   By: Donavan Foil M.D.   On: 11/24/2017 02:48   Dg Abd Portable 1v  Result Date: 11/24/2017 CLINICAL DATA:  OG tube placement. EXAM: PORTABLE ABDOMEN - 1 VIEW COMPARISON:  None. FINDINGS: Interval placement of an OG tube with the tip likely in the gastric antrum. Nonobstructive bowel gas pattern. The lung bases are clear. IMPRESSION: OG tube with the tip likely in the gastric antrum. Electronically Signed   By: Titus Dubin M.D.   On: 11/24/2017 16:53    ECG & Cardiac Imaging    4/5 - sinus tach vs ectopic atrial tach, 145, vent couplet, nonspecific st/t changes.  Assessment & Plan    1.  Acute on chronic combined systolic and diastolic congestive heart failure/nonischemic cardiomyopathy: Patient with a known history of nonischemic cardiomyopathy and EF of 20% with nonobstructive CAD on catheterization in October 2018.  He re-presented with respiratory failure and pulmonary edema on chest x-ray.  He has remained intubated and sedated and managed by critical care medicine.  Chest x-ray has slowly improved.  Last dose of Lasix was April 5-day of admission.  Unfortunately, his bed scale is broken and they have not been able to obtain daily weights.  He does not have a central line and therefore we cannot  transduce CVP's.  Exam is somewhat challenging as he has very coarse breath sounds in his neck is somewhat thick and difficult to gauge JVP.  He does not have any edema.  He is -2.8 L for this admission.  He was previously on beta-blocker and Entresto.  These are currently on hold in the setting of n.p.o. status and acute decompensation.  Will follow.  Would have a low threshold to resume low-dose intravenous Lasix now that renal function has stabilized.  2.  Acute hypoxic respiratory failure: Ventilator management per critical care medicine.  Now on antibiotics.  3.  Hypertensive heart disease: Blood pressures have been stable in the setting of sedation.  4.  Stage III chronic kidney disease with acute worsening this admission.  Now stable.  5.  Type 2 diabetes mellitus: Insulin management per medicine.  Signed, Murray Hodgkins, NP 11/27/2017, 9:22 AM  For questions or updates, please contact   Please consult [email or web address] for contact info under Cardiology/STEMI.

## 2017-11-27 NOTE — Progress Notes (Signed)
Pharmacy Antibiotic Note  Jorge Cruz is a 67 y.o. male admitted on 11/24/2017 with acute respiratory failure. Pharmacy has been consulted for cefepime and vancomyicn dosing for pneumonia. MRSA PCR negative 4/5, however patient has been hospitalized since. Patient had multiple fevers on 4/7. Sputum culture showing staph aureus and blood cultures currently no growth.   Plan: Continue cefepime 2g IV Q12hr.   Continue vancomycin 1250mg  IV Q18hr for goal trough of 15-20. Will recheck serum creatinine labs. Will obtain trough as clinically indicated.   Height: 5\' 10"  (177.8 cm) Weight: 222 lb 10.6 oz (101 kg) IBW/kg (Calculated) : 73  Temp (24hrs), Avg:100.8 F (38.2 C), Min:99.9 F (37.7 C), Max:101.7 F (38.7 C)  Recent Labs  Lab 11/24/17 0202 11/25/17 0438 11/26/17 0522 11/26/17 0628 11/26/17 2045  WBC 7.8  --   --  7.5 8.4  CREATININE 1.62* 1.71* 1.69*  --  1.43*    Estimated Creatinine Clearance: 60.5 mL/min (A) (by C-G formula based on SCr of 1.43 mg/dL (H)).    No Known Allergies  Antimicrobials this admission: Cefepime 4/7 >>  Vancomycin 4/7  >>   Dose adjustments this admission: N/A  Microbiology results: 4/7 BCx: no growth < 24 hours  4/7 TA: abundant staph aureus  4/5 MRSA PCR: negative  Thank you for allowing pharmacy to be a part of this patient's care.  Simpson,Michael L 11/27/2017 11:27 AM

## 2017-11-27 NOTE — Progress Notes (Signed)
[place] at [facility]                                                                                                                                                                                  Patient Demographics   Jorge Cruz, is a 67 y.o. male, DOB - 1950-09-17, WNI:[id number]  Admit date - 11/24/2017   Admitting Physician Harrie Foreman, MD  Outpatient Primary MD for the patient is [facility], [facility]   LOS - 3  Subjective: Remains on the ventilator    Review of Systems:   CONSTITUTIONAL: Intubated Vitals:   Vitals:   11/27/17 1100 11/27/17 1200 11/27/17 1300 11/27/17 1400  BP: 113/62 110/60 112/62 (!) 109/55  Pulse:      Resp: (!) 23 (!) 22 (!) 23 (!) 21  Temp: (!) 100.8 F (38.2 C) (!) 100.8 F (38.2 C) (!) 100.8 F (38.2 C) (!) 100.8 F (38.2 C)  TempSrc:  Bladder    SpO2:      Weight:      Height:        Wt Readings from Last 3 Encounters:  11/24/17 101 kg (222 lb 10.6 oz)  09/13/17 101.8 kg (224 lb 6 oz)  07/26/17 100.8 kg (222 lb 4 oz)     Intake/Output Summary (Last 24 hours) at 11/27/2017 1535 Last data filed at 11/27/2017 1422 Gross per 24 hour  Intake 1795.44 ml  Output 3800 ml  Net -2004.56 ml    Physical Exam:   GENERAL: Critically ill-appearing HEAD, EYES, EARS, NOSE AND THROAT: Atraumatic, normocephalic. Extraocular muscles are intact. Pupils equal and reactive to light. Sclerae anicteric. No conjunctival injection. No oro-pharyngeal erythema.  NECK: Supple. There is no jugular venous distention. No bruits, no lymphadenopathy, no thyromegaly.  HEART: Regular rate and rhythm,. No murmurs, no rubs, no clicks.  LUNGS: Clear to auscultation bilaterally. No rales or rhonchi. No wheezes.  On the vent ABDOMEN: Soft, flat, nontender, nondistended. Has good bowel sounds. No hepatosplenomegaly appreciated.  EXTREMITIES: No evidence of any cyanosis, clubbing, or peripheral edema.  +2 pedal and radial pulses  bilaterally.  NEUROLOGIC: Sedated SKIN: Moist and warm with no rashes appreciated.  Psych: Sedated LN: No inguinal LN enlargement    Antibiotics   Anti-infectives (From admission, onward)   Start     Dose/Rate Route Frequency Ordered Stop   11/27/17 0400  vancomycin (VANCOCIN) 1,250 mg in sodium chloride 0.9 % 250 mL IVPB     1,250 mg 166.7 mL/hr over 90 Minutes Intravenous Every 18 hours 11/26/17 2034     11/26/17 2100  ceFEPIme (MAXIPIME) 2 g in sodium chloride 0.9 % 100 mL IVPB  2 g 200 mL/hr over 30 Minutes Intravenous Every 12 hours 11/26/17 2030     11/26/17 2100  vancomycin (VANCOCIN) IVPB 1000 mg/200 mL premix     1,000 mg 200 mL/hr over 60 Minutes Intravenous  Once 11/26/17 2030 11/26/17 2330      Medications   Scheduled Meds: . carvedilol  3.125 mg Per Tube BID WC  . chlorhexidine gluconate (MEDLINE KIT)  15 mL Mouth Rinse BID  . docusate  100 mg Per Tube BID  . enoxaparin (LOVENOX) injection  40 mg Subcutaneous Q24H  . famotidine  20 mg Per Tube Daily  . feeding supplement (PRO-STAT SUGAR FREE 64)  60 mL Per Tube QID  . insulin aspart  0-9 Units Subcutaneous Q4H  . insulin glargine  30 Units Subcutaneous Q24H  . ipratropium-albuterol  3 mL Nebulization Q6H  . mouth rinse  15 mL Mouth Rinse 10 times per day  . multivitamin  15 mL Per Tube q1800  . nitroGLYCERIN  0.3 mg Transdermal Daily   Continuous Infusions: . ceFEPime (MAXIPIME) IV Stopped (11/27/17 1144)  . feeding supplement (VITAL HIGH PROTEIN) Stopped (11/27/17 0730)  . propofol (DIPRIVAN) infusion 50 mcg/kg/min (11/27/17 1422)  . vancomycin Stopped (11/27/17 0651)   PRN Meds:.acetaminophen **OR** acetaminophen, fentaNYL (SUBLIMAZE) injection, [DISCONTINUED] ondansetron **OR** ondansetron (ZOFRAN) IV   Data Review:   Micro Results Recent Results (from the past 240 hour(s))  MRSA PCR Screening     Status: None   Collection Time: 11/24/17  4:31 AM  Result Value Ref Range Status   MRSA by PCR  NEGATIVE NEGATIVE Final    Comment:        The GeneXpert MRSA Assay (FDA approved for NASAL specimens only), is one component of a comprehensive MRSA colonization surveillance program. It is not intended to diagnose MRSA infection nor to guide or monitor treatment for MRSA infections. Performed at [facility], [address] [place], [place] [address]   Culture, respiratory (NON-Expectorated)     Status: None (Preliminary result)   Collection Time: 11/26/17  9:29 AM  Result Value Ref Range Status   Specimen Description   Final    TRACHEAL ASPIRATE Performed at [facility], [address], [place], [place] [address]    Special Requests   Final    NONE Performed at [facility], [address] [place], [place] [address]    Gram Stain   Final    MODERATE WBC PRESENT,BOTH PMN AND MONONUCLEAR NO SQUAMOUS EPITHELIAL CELLS SEEN ABUNDANT GRAM POSITIVE COCCI IN CLUSTERS    Culture   Final    ABUNDANT STAPHYLOCOCCUS AUREUS SUSCEPTIBILITIES TO FOLLOW Performed at [facility], [place] [address], [place], [place] [address]    Report Status PENDING  Incomplete  CULTURE, BLOOD (ROUTINE X 2) w Reflex to ID Panel     Status: None (Preliminary result)   Collection Time: 11/26/17  2:48 PM  Result Value Ref Range Status   Specimen Description BLOOD RT HAND  Final   Special Requests   Final    BOTTLES DRAWN AEROBIC AND ANAEROBIC Blood Culture adequate volume   Culture   Final    NO GROWTH < 24 HOURS Performed at [facility], [address] [place], [place] [address]    Report Status PENDING  Incomplete  CULTURE, BLOOD (ROUTINE X 2) w Reflex to ID Panel     Status: None (Preliminary result)   Collection Time: 11/26/17  2:48 PM  Result Value Ref Range  Status   Specimen Description BLOOD LT HAND  Final   Special Requests   Final    BOTTLES DRAWN AEROBIC AND ANAEROBIC Blood Culture adequate volume   Culture   Final    NO GROWTH < 24  HOURS Performed at [facility], [address], [place], [place] [address]    Report Status PENDING  Incomplete    Radiology Reports Dg Chest 1 View  Result Date: 11/27/2017 CLINICAL DATA:  67 y/o  M; pulmonary edema. EXAM: CHEST  1 VIEW COMPARISON:  11/26/2017 chest radiograph FINDINGS: Stable cardiomegaly given projection and technique. Stable endotracheal tube 3.8 cm above the carina. Diminished interstitial pulmonary edema. No focal consolidation. No pleural effusion or pneumothorax. Bones are unremarkable. IMPRESSION: Diminished interstitial pulmonary edema. No new consolidation. Stable endotracheal tube. Electronically Signed   By: Kristine Garbe M.D.   On: 11/27/2017 04:38   Dg Chest Port 1 View  Result Date: 11/26/2017 CLINICAL DATA:  Respiratory failure. Patient with hypercapnic respiratory failure. Presently on mechanical ventilationHx of CHF, smoker, COPD, diabetes, NICM EXAM: PORTABLE CHEST 1 VIEW COMPARISON:  11/25/2017 FINDINGS: New endotracheal tube tip projects 3.5 cm above the Carina. Nasal/orogastric tube is stable passing below the diaphragm into the stomach. Lungs demonstrate vascular congestion and interstitial thickening, increased when compared to the prior exam. Cardiac silhouette is mildly enlarged. No convincing pleural effusion.  No pneumothorax. IMPRESSION: 1. Well-positioned endotracheal tube, tip 3.5 cm above the Carina. 2. Worsened lung aeration consistent with increased interstitial pulmonary edema. Electronically Signed   By: Lajean Manes M.D.   On: 11/26/2017 11:50   Dg Chest Port 1 View  Result Date: 11/25/2017 CLINICAL DATA:  Respiratory failure EXAM: PORTABLE CHEST 1 VIEW COMPARISON:  11/24/2017 FINDINGS: Cardiac shadow is enlarged but stable. The gastric catheter is noted extending towards the stomach. The endotracheal tube is again noted above the thoracic inlet and certainly could be advanced several cm. The lungs are clear bilaterally. This  has improved significantly in the interval from the prior exam. No bony abnormality is noted. IMPRESSION: Significant improvement in vascular congestion and pulmonary edema. No focal infiltrate is seen. Tubes and lines as described. Electronically Signed   By: Inez Catalina M.D.   On: 11/25/2017 06:59   Dg Chest Port 1 View  Result Date: 11/24/2017 CLINICAL DATA:  Shortness of breath, post intubation EXAM: PORTABLE CHEST 1 VIEW COMPARISON:  05/30/2017 FINDINGS: Endotracheal tube tip possibly visible at the thoracic inlet. Poorly visible distal esophageal tubing, appears to course below the diaphragm but the tip is not well seen. Cardiomegaly with vascular congestion and moderate diffuse interstitial opacity concerning for pulmonary edema. No pneumothorax. IMPRESSION: 1. Tip of the endotracheal tube appears to be positioned at or slightly above the thoracic inlet 2. Poor visibility of the distal portion of the esophageal tube, appears to course below the diaphragm but tip is not included 3. Cardiomegaly with vascular congestion and moderate diffuse pulmonary edema Electronically Signed   By: Donavan Foil M.D.   On: 11/24/2017 02:48   Dg Abd Portable 1v  Result Date: 11/24/2017 CLINICAL DATA:  OG tube placement. EXAM: PORTABLE ABDOMEN - 1 VIEW COMPARISON:  None. FINDINGS: Interval placement of an OG tube with the tip likely in the gastric antrum. Nonobstructive bowel gas pattern. The lung bases are clear. IMPRESSION: OG tube with the tip likely in the gastric antrum. Electronically Signed   By: Titus Dubin M.D.   On: 11/24/2017 16:53     CBC Recent Labs  Lab 11/24/17 0202 11/26/17 0628 11/26/17 2045  WBC 7.8 7.5 8.4  HGB 13.5 12.5* 12.3*  HCT 41.3 38.3* 37.4*  PLT 226 148* 127*  MCV 97.2 95.1 95.1  MCH 31.7 31.2 31.3  MCHC 32.6 32.8 32.9  RDW 16.7* 16.2* 16.5*  LYMPHSABS 4.3*  --   --   MONOABS 0.6  --   --   EOSABS 0.1  --   --   BASOSABS 0.1  --   --     Chemistries  Recent Labs   Lab 11/24/17 0202 11/25/17 0438 11/26/17 0522 11/26/17 2045  NA 141 140 140 139  K 4.1 4.9 5.2* 4.0  CL 107 109 111 106  CO2 '25 22 23 26  ' GLUCOSE 358* 133* 104* 153*  BUN 36* 59* 62* 51*  CREATININE 1.62* 1.71* 1.69* 1.43*  CALCIUM 8.7* 8.7* 9.0 8.7*  MG  --  2.0  --  2.2   ------------------------------------------------------------------------------------------------------------------ estimated creatinine clearance is 60.5 mL/min (A) (by C-G formula based on SCr of 1.43 mg/dL (H)). ------------------------------------------------------------------------------------------------------------------ No results for input(s): HGBA1C in the last 72 hours. ------------------------------------------------------------------------------------------------------------------ Recent Labs    11/27/17 0524  TRIG 363*   ------------------------------------------------------------------------------------------------------------------ No results for input(s): TSH, T4TOTAL, T3FREE, THYROIDAB in the last 72 hours.  Invalid input(s): FREET3 ------------------------------------------------------------------------------------------------------------------ No results for input(s): VITAMINB12, FOLATE, FERRITIN, TIBC, IRON, RETICCTPCT in the last 72 hours.  Coagulation profile No results for input(s): INR, PROTIME in the last 168 hours.  No results for input(s): DDIMER in the last 72 hours.  Cardiac Enzymes Recent Labs  Lab 11/24/17 1049 11/24/17 1402 11/25/17 0438  TROPONINI 0.30* 0.13* 0.31*   ------------------------------------------------------------------------------------------------------------------ Invalid input(s): POCBNP    Assessment & Plan   This is a 67 year old male admitted for acute respiratory failure with hypoxia. 1.  Respiratory failure: Acute; with hypoxia due to pulmonary edema secondary to acute on chronic systolic heart failure.  Repeat echo shows EF 25% that is  significantly dropped cardiology consult Continue ventilator support, appreciate cardiology input 2.  Elevated troponin: Likely due to demand ischemia follow troponin levels 3.  COPD: Continue nebulizer therapy 4.  Diabetes mellitus type 2: Continue basal insulin therapy as well as sliding scale insulin while hospitalized.   5.  DVT prophylaxis: Lovenox 6.  GI prophylaxis: Initiate PPI 24 hours after intubation The patient is a full code.        Code Status Orders  (From admission, onward)        Start     Ordered   11/24/17 0334  Full code  Continuous     11/24/17 0333    Code Status History    Date Active Date Inactive Code Status Order ID Comments User Context   05/31/2017 0252 06/01/2017 2203 Full Code [id number]  Harrie Foreman, MD Inpatient   03/31/2016 2359 04/01/2016 1247 Full Code [id number]  Lance Coon, MD Inpatient           Consults intensivist  DVT Prophylaxis  Lovenox   Lab Results  Component Value Date   PLT 127 (L) 11/26/2017     Time Spent in minutes 35 minutes  Dustin Flock M.D on 11/27/2017 at 3:35 PM  Between 7am to 6pm - Pager - [phone]  After 6pm go to [email or web address] - Proofreader  [employer]   Office  [phone]

## 2017-11-27 NOTE — Progress Notes (Signed)
Pt seen on rounds this AM and care plan reviewed in detail and modified. Failed SBT with arrhythmias and pink frothy secretions. Also has moderate purulent secretions. Has severe cardiomyopathy.  Will initiate NTG patch (for preload reduction) and low dose beta blocker. One dose of furosemide given  Billy Fischer, MD PCCM service Mobile [phone] Pager [phone] 11/27/2017 1:31 PM

## 2017-11-28 ENCOUNTER — Inpatient Hospital Stay: Payer: Medicare Other

## 2017-11-28 DIAGNOSIS — J9601 Acute respiratory failure with hypoxia: Principal | ICD-10-CM

## 2017-11-28 DIAGNOSIS — I5043 Acute on chronic combined systolic (congestive) and diastolic (congestive) heart failure: Secondary | ICD-10-CM

## 2017-11-28 DIAGNOSIS — N183 Chronic kidney disease, stage 3 (moderate): Secondary | ICD-10-CM

## 2017-11-28 LAB — BLOOD GAS, ARTERIAL
Acid-Base Excess: 2.8 mmol/L — ABNORMAL HIGH (ref 0.0–2.0)
Bicarbonate: 29.7 mmol/L — ABNORMAL HIGH (ref 20.0–28.0)
FIO2: 0.36
O2 Saturation: 93.7 %
Patient temperature: 37
pCO2 arterial: 55 mmHg — ABNORMAL HIGH (ref 32.0–48.0)
pH, Arterial: 7.34 — ABNORMAL LOW (ref 7.350–7.450)
pO2, Arterial: 74 mmHg — ABNORMAL LOW (ref 83.0–108.0)

## 2017-11-28 LAB — CULTURE, RESPIRATORY W GRAM STAIN

## 2017-11-28 LAB — GLUCOSE, CAPILLARY
Glucose-Capillary: 122 mg/dL — ABNORMAL HIGH (ref 65–99)
Glucose-Capillary: 133 mg/dL — ABNORMAL HIGH (ref 65–99)
Glucose-Capillary: 125 mg/dL — ABNORMAL HIGH (ref 65–99)
Glucose-Capillary: 212 mg/dL — ABNORMAL HIGH (ref 65–99)
Glucose-Capillary: 131 mg/dL — ABNORMAL HIGH (ref 65–99)
Glucose-Capillary: 104 mg/dL — ABNORMAL HIGH (ref 65–99)

## 2017-11-28 LAB — CBC
HCT: 34.8 % — ABNORMAL LOW (ref 40.0–52.0)
Hemoglobin: 11.6 g/dL — ABNORMAL LOW (ref 13.0–18.0)
MCH: 31.5 pg (ref 26.0–34.0)
MCHC: 33.3 g/dL (ref 32.0–36.0)
MCV: 94.7 fL (ref 80.0–100.0)
Platelets: 136 10*3/uL — ABNORMAL LOW (ref 150–440)
RBC: 3.68 MIL/uL — ABNORMAL LOW (ref 4.40–5.90)
RDW: 16.1 % — ABNORMAL HIGH (ref 11.5–14.5)
WBC: 7.5 10*3/uL (ref 3.8–10.6)

## 2017-11-28 LAB — PROCALCITONIN: Procalcitonin: 2.19 ng/mL

## 2017-11-28 LAB — BASIC METABOLIC PANEL
Anion gap: 5 (ref 5–15)
BUN: 61 mg/dL — ABNORMAL HIGH (ref 6–20)
CO2: 26 mmol/L (ref 22–32)
Calcium: 8.6 mg/dL — ABNORMAL LOW (ref 8.9–10.3)
Chloride: 109 mmol/L (ref 101–111)
Creatinine, Ser: 1.4 mg/dL — ABNORMAL HIGH (ref 0.61–1.24)
GFR calc Af Amer: 59 mL/min — ABNORMAL LOW (ref >60)
GFR calc non Af Amer: 51 mL/min — ABNORMAL LOW (ref >60)
Glucose, Bld: 150 mg/dL — ABNORMAL HIGH (ref 65–99)
Potassium: 4 mmol/L (ref 3.5–5.1)
Sodium: 140 mmol/L (ref 135–145)

## 2017-11-28 MED ORDER — SENNOSIDES-DOCUSATE SODIUM 8.6-50 MG PO TABS
2.0000 | ORAL_TABLET | Freq: Two times a day (BID) | ORAL | Status: DC
  Administered 2017-11-28 – 2017-11-30 (×5): 2
  Filled 2017-11-28 (×6): qty 2

## 2017-11-28 MED ORDER — IPRATROPIUM-ALBUTEROL 0.5-2.5 (3) MG/3ML IN SOLN
3.0000 mL | RESPIRATORY_TRACT | Status: DC
  Administered 2017-11-29 (×3): 3 mL via RESPIRATORY_TRACT
  Filled 2017-11-28 (×3): qty 3

## 2017-11-28 MED ORDER — CEFAZOLIN SODIUM-DEXTROSE 2-4 GM/100ML-% IV SOLN
2.0000 g | Freq: Three times a day (TID) | INTRAVENOUS | Status: AC
  Administered 2017-11-28 – 2017-11-30 (×6): 2 g via INTRAVENOUS
  Filled 2017-11-28 (×9): qty 100

## 2017-11-28 MED ORDER — METHYLPREDNISOLONE SODIUM SUCC 40 MG IJ SOLR
40.0000 mg | Freq: Two times a day (BID) | INTRAMUSCULAR | Status: DC
  Administered 2017-11-28: 40 mg via INTRAVENOUS
  Filled 2017-11-28 (×2): qty 1

## 2017-11-28 MED ORDER — FENTANYL BOLUS VIA INFUSION
50.0000 ug | INTRAVENOUS | Status: DC | PRN
  Filled 2017-11-28: qty 50

## 2017-11-28 MED ORDER — BUDESONIDE 0.5 MG/2ML IN SUSP
0.5000 mg | Freq: Two times a day (BID) | RESPIRATORY_TRACT | Status: DC
  Administered 2017-11-28 – 2017-11-29 (×2): 0.5 mg via RESPIRATORY_TRACT
  Filled 2017-11-28 (×2): qty 2

## 2017-11-28 MED ORDER — LORAZEPAM 2 MG/ML IJ SOLN: 2.0000 mg | INTRAMUSCULAR | Status: DC | PRN

## 2017-11-28 MED ORDER — FENTANYL 2500MCG IN NS 250ML (10MCG/ML) PREMIX INFUSION
25.0000 ug/h | INTRAVENOUS | Status: DC
Start: 2017-11-28 — End: 2017-11-29
  Administered 2017-11-28: 100 ug/h via INTRAVENOUS
  Filled 2017-11-28: qty 250

## 2017-11-28 MED ORDER — FUROSEMIDE 10 MG/ML IJ SOLN
40.0000 mg | Freq: Once | INTRAMUSCULAR | Status: AC
  Administered 2017-11-28: 40 mg via INTRAVENOUS
  Filled 2017-11-28: qty 4

## 2017-11-28 MED ORDER — FENTANYL CITRATE (PF) 100 MCG/2ML IJ SOLN
50.0000 ug | Freq: Once | INTRAMUSCULAR | Status: AC
  Administered 2017-11-28: 50 ug via INTRAVENOUS

## 2017-11-28 NOTE — Progress Notes (Signed)
Patient is extubated to 4 lpm O2 Jorge Cruz

## 2017-11-28 NOTE — Progress Notes (Signed)
Pharmacy Antibiotic Note  Jorge Cruz is a 67 y.o. male admitted on 11/24/2017 with acute respiratory failure. Pharmacy has been consulted for cefazolin dosing. Sputum culture showing MSSA. Patient previously received 3 days on Vancomycin.  Plan: Initiate Cefazolin 2g IV q8h.  Height: 5\' 10"  (177.8 cm) Weight: 222 lb 10.6 oz (101 kg) IBW/kg (Calculated) : 73  Temp (24hrs), Avg:100.2 F (37.9 C), Min:96.6 F (35.9 C), Max:102.4 F (39.1 C)  Recent Labs  Lab 11/24/17 0202 11/25/17 0438 11/26/17 0522 11/26/17 0628 11/26/17 2045 11/28/17 0529  WBC 7.8  --   --  7.5 8.4 7.5  CREATININE 1.62* 1.71* 1.69*  --  1.43* 1.40*    Estimated Creatinine Clearance: 61.8 mL/min (A) (by C-G formula based on SCr of 1.4 mg/dL (H)).    No Known Allergies  Antimicrobials this admission: Cefepime 4/7 >> 4/8 Vancomycin 4/7  >> 4/9 Cefazolin 4/9 >>  Dose adjustments this admission: N/A  Microbiology results: 4/7 BCx: no growth 2 days 4/7 TA: MSSA 4/5 MRSA PCR: negative  Thank you for allowing pharmacy to be a part of this patient's care.  Myrtha Mantis 11/28/2017 1:40 PM

## 2017-11-28 NOTE — Progress Notes (Signed)
Progress Note  Patient Name: Jorge Cruz Date of Encounter: 11/28/2017  Primary Cardiologist: Nelva Bush, MD   Subjective   Intubated and sedated.  Inpatient Medications    Scheduled Meds: . carvedilol  3.125 mg Per Tube BID WC  . chlorhexidine gluconate (MEDLINE KIT)  15 mL Mouth Rinse BID  . docusate  100 mg Per Tube BID  . enoxaparin (LOVENOX) injection  40 mg Subcutaneous Q24H  . famotidine  20 mg Per Tube Daily  . feeding supplement (PRO-STAT SUGAR FREE 64)  60 mL Per Tube QID  . insulin aspart  0-9 Units Subcutaneous Q4H  . insulin glargine  30 Units Subcutaneous Q24H  . ipratropium-albuterol  3 mL Nebulization Q6H  . mouth rinse  15 mL Mouth Rinse 10 times per day  . multivitamin  15 mL Per Tube q1800  . nitroGLYCERIN  0.3 mg Transdermal Daily  . senna-docusate  2 tablet Per Tube BID   Continuous Infusions: . feeding supplement (VITAL HIGH PROTEIN) 15 mL/hr at 11/28/17 0600  . propofol (DIPRIVAN) infusion 50 mcg/kg/min (11/28/17 0926)  . vancomycin Stopped (11/27/17 2232)   PRN Meds: acetaminophen **OR** acetaminophen, fentaNYL (SUBLIMAZE) injection, [DISCONTINUED] ondansetron **OR** ondansetron (ZOFRAN) IV   Vital Signs    Vitals:   11/28/17 0808 11/28/17 0855 11/28/17 0900 11/28/17 1000  BP:  (!) 116/56 122/69 (!) 87/64  Pulse:  76 78 73  Resp:   20 20  Temp:      TempSrc:      SpO2: 100%  99% 98%  Weight:      Height:        Intake/Output Summary (Last 24 hours) at 11/28/2017 1022 Last data filed at 11/28/2017 0800 Gross per 24 hour  Intake 1228.93 ml  Output 3010 ml  Net -1781.07 ml   Filed Weights   11/24/17 0201 11/24/17 0431  Weight: 225 lb (102.1 kg) 222 lb 10.6 oz (101 kg)    Telemetry    NSR with isolated PAC's and PVC's - Personally Reviewed  ECG    No new tracing  Physical Exam   GEN: Untubated and sedated Neck: Unable to assess due to support devices. Cardiac: RRR w/o murmurs or rubs. Respiratory: Clear anteriorly. GI:  Soft, nontender, non-distended  MS: 1+ dependent edema in both legs. Neuro:  Intubated, sedated, and unresponsive. Psych: Intubated, sedated, and unresponsive.  Labs    Chemistry Recent Labs  Lab 11/26/17 0522 11/26/17 2045 11/28/17 0529  NA 140 139 140  K 5.2* 4.0 4.0  CL 111 106 109  CO2 '23 26 26  ' GLUCOSE 104* 153* 150*  BUN 62* 51* 61*  CREATININE 1.69* 1.43* 1.40*  CALCIUM 9.0 8.7* 8.6*  GFRNONAA 41* 50* 51*  GFRAA 47* 57* 59*  ANIONGAP '6 7 5     ' Hematology Recent Labs  Lab 11/26/17 0628 11/26/17 2045 11/28/17 0529  WBC 7.5 8.4 7.5  RBC 4.02* 3.94* 3.68*  HGB 12.5* 12.3* 11.6*  HCT 38.3* 37.4* 34.8*  MCV 95.1 95.1 94.7  MCH 31.2 31.3 31.5  MCHC 32.8 32.9 33.3  RDW 16.2* 16.5* 16.1*  PLT 148* 127* 136*    Cardiac Enzymes Recent Labs  Lab 11/24/17 0202 11/24/17 1049 11/24/17 1402 11/25/17 0438  TROPONINI 0.08* 0.30* 0.13* 0.31*   No results for input(s): TROPIPOC in the last 168 hours.   BNP Recent Labs  Lab 11/24/17 0202  BNP 519.0*     DDimer No results for input(s): DDIMER in the last 168 hours.  Radiology    Dg Chest 1 View  Result Date: 11/27/2017 CLINICAL DATA:  67 y/o  M; pulmonary edema. EXAM: CHEST  1 VIEW COMPARISON:  11/26/2017 chest radiograph FINDINGS: Stable cardiomegaly given projection and technique. Stable endotracheal tube 3.8 cm above the carina. Diminished interstitial pulmonary edema. No focal consolidation. No pleural effusion or pneumothorax. Bones are unremarkable. IMPRESSION: Diminished interstitial pulmonary edema. No new consolidation. Stable endotracheal tube. Electronically Signed   By: Kristine Garbe M.D.   On: 11/27/2017 04:38   Dg Chest Port 1 View  Result Date: 11/28/2017 CLINICAL DATA:  67 year old male with respiratory failure. Subsequent encounter. EXAM: PORTABLE CHEST 1 VIEW COMPARISON:  11/27/2017 chest x-ray. FINDINGS: Endotracheal tube tip 2.8 cm above the carina. Nasogastric tube courses below  the diaphragm. Tip is not included on the present exam. Mild cardiomegaly. Pulmonary vascular congestion greatest centrally and lower lobe region similar to prior exam. No pneumothorax detected. IMPRESSION: Similar appearance of mild pulmonary vascular congestion. Electronically Signed   By: Genia Del M.D.   On: 11/28/2017 06:54    Cardiac Studies   Echo (11/25/17): - Left ventricle: The cavity size was mildly dilated. There was   mild concentric hypertrophy. Systolic function was severely   reduced. The estimated ejection fraction was less than 20%.   Diffuse hypokinesis. Regional wall motion abnormalities cannot be   excluded. Features are consistent with a pseudonormal left   ventricular filling pattern, with concomitant abnormal relaxation   and increased filling pressure (grade 2 diastolic dysfunction). - Left atrium: The atrium was normal in size. - Right ventricle: Systolic function was mildly to moderately   reduced. - Pulmonary arteries: Systolic pressure was mildly elevated PA peak   pressure: 35 mm Hg (S).  Patient Profile     67 y.o. male NICM, combined syst/diast CHF, CKD III, HTN, DM, COPD, tob abuse, polysubstance abuse, noncompliance, and non-obstructive CAD, admitted with acute respiratory failure with hypoxia.  Assessment & Plan    Acute on chronic systolic and diastolic heart failure Volume assessment challenging, though oxygenation is good on minimal vent settings.  Mild dependent edema still present.  Continue gentle diuresis; will give furosemide 40 mg IV x 1 today and reassess kidney function tomorrow.  Continue current dose of carvedilol 3.125 mg BID.  Defer adding ACEI/ARB/ARNI in the setting of soft BP and CKD.  Acute hypoxic respiratory failure Likely multifactorial, including CHF and possible PNA.  Diurese as above.  Vent an antimicrobial management per PCCM.  Chronic kidney disease stage III Creatinine stable.  Gentle diuresis, as  above.  Avoid nephrotoxic agents.  For questions or updates, please contact [place] Please consult [email or web address] for contact info under [facility] Cardiology.     Signed, Nelva Bush, MD  11/28/2017, 10:22 AM

## 2017-11-28 NOTE — Progress Notes (Signed)
Nurse requested Spiritual Support for Jasmine December (wife of 3 years, together for 6, live near hospital, rest of family is in [place] ) as she expressed emotions as a result of Donavon's decline. Jasmine December expressed Edilberto stated "I'm tired." She understands he has been ill for some time and she does not want him to "suffer." Chaplain offered listening presence and prayer.     11/28/17 2100  Clinical Encounter Type  Visited With Patient and family together  Visit Type Spiritual support;Critical Care  Referral From Nurse  Spiritual Encounters  Spiritual Needs Prayer;Emotional  Stress Factors  Patient Stress Factors Health changes;Exhausted

## 2017-11-28 NOTE — Progress Notes (Signed)
[place] at [facility]                                                                                                                                                                                  Patient Demographics   Jorge Cruz, is a 67 y.o. male, DOB - 11-27-1950, NLZ:[id number]  Admit date - 11/24/2017   Admitting Physician Harrie Foreman, MD  Outpatient Primary MD for the patient is [name]   LOS - 4  Subjective: Patient continues to be on the ventilator    Review of Systems:   CONSTITUTIONAL: Intubated Vitals:   Vitals:   11/28/17 1000 11/28/17 1309 11/28/17 1400 11/28/17 1414  BP: (!) 87/64  (!) 170/85   Pulse: 73  (!) 107   Resp: 20  (!) 31   Temp:   (!) 101.3 F (38.5 C)   TempSrc:      SpO2: 98% 97%  96%  Weight:      Height:        Wt Readings from Last 3 Encounters:  11/24/17 101 kg (222 lb 10.6 oz)  09/13/17 101.8 kg (224 lb 6 oz)  07/26/17 100.8 kg (222 lb 4 oz)     Intake/Output Summary (Last 24 hours) at 11/28/2017 1515 Last data filed at 11/28/2017 0800 Gross per 24 hour  Intake 897.22 ml  Output 1460 ml  Net -562.78 ml    Physical Exam:   GENERAL: Critically ill-appearing HEAD, EYES, EARS, NOSE AND THROAT: Atraumatic, normocephalic. Extraocular muscles are intact. Pupils equal and reactive to light. Sclerae anicteric. No conjunctival injection. No oro-pharyngeal erythema.  NECK: Supple. There is no jugular venous distention. No bruits, no lymphadenopathy, no thyromegaly.  HEART: Regular rate and rhythm,. No murmurs, no rubs, no clicks.  LUNGS: Clear to auscultation bilaterally. No rales or rhonchi. No wheezes.  On the vent ABDOMEN: Soft, flat, nontender, nondistended. Has good bowel sounds. No hepatosplenomegaly appreciated.  EXTREMITIES: No evidence of any cyanosis, clubbing, or peripheral edema.  +2 pedal and radial pulses bilaterally.  NEUROLOGIC: Sedated SKIN: Moist and warm with no  rashes appreciated.  Psych: Sedated LN: No inguinal LN enlargement    Antibiotics   Anti-infectives (From admission, onward)   Start     Dose/Rate Route Frequency Ordered Stop   11/28/17 1400  ceFAZolin (ANCEF) IVPB 2g/100 mL premix     2 g 200 mL/hr over 30 Minutes Intravenous Every 8 hours 11/28/17 1214     11/27/17 0400  vancomycin (VANCOCIN) 1,250 mg in sodium chloride 0.9 % 250 mL IVPB  Status:  Discontinued     1,250 mg 166.7 mL/hr over 90 Minutes Intravenous  Every 18 hours 11/26/17 2034 11/28/17 1159   11/26/17 2100  ceFEPIme (MAXIPIME) 2 g in sodium chloride 0.9 % 100 mL IVPB  Status:  Discontinued     2 g 200 mL/hr over 30 Minutes Intravenous Every 12 hours 11/26/17 2030 11/27/17 1654   11/26/17 2100  vancomycin (VANCOCIN) IVPB 1000 mg/200 mL premix     1,000 mg 200 mL/hr over 60 Minutes Intravenous  Once 11/26/17 2030 11/26/17 2330      Medications   Scheduled Meds: . carvedilol  3.125 mg Per Tube BID WC  . chlorhexidine gluconate (MEDLINE KIT)  15 mL Mouth Rinse BID  . docusate  100 mg Per Tube BID  . enoxaparin (LOVENOX) injection  40 mg Subcutaneous Q24H  . famotidine  20 mg Per Tube Daily  . feeding supplement (PRO-STAT SUGAR FREE 64)  60 mL Per Tube QID  . insulin aspart  0-9 Units Subcutaneous Q4H  . insulin glargine  30 Units Subcutaneous Q24H  . ipratropium-albuterol  3 mL Nebulization Q6H  . mouth rinse  15 mL Mouth Rinse 10 times per day  . multivitamin  15 mL Per Tube q1800  . nitroGLYCERIN  0.3 mg Transdermal Daily  . senna-docusate  2 tablet Per Tube BID   Continuous Infusions: .  ceFAZolin (ANCEF) IV    . feeding supplement (VITAL HIGH PROTEIN) 15 mL/hr at 11/28/17 0600  . fentaNYL infusion INTRAVENOUS 100 mcg/hr (11/28/17 1430)  . propofol (DIPRIVAN) infusion Stopped (11/28/17 1429)   PRN Meds:.acetaminophen **OR** acetaminophen, fentaNYL, fentaNYL (SUBLIMAZE) injection, [DISCONTINUED] ondansetron **OR** ondansetron (ZOFRAN) IV   Data Review:    Micro Results Recent Results (from the past 240 hour(s))  MRSA PCR Screening     Status: None   Collection Time: 11/24/17  4:31 AM  Result Value Ref Range Status   MRSA by PCR NEGATIVE NEGATIVE Final    Comment:        The GeneXpert MRSA Assay (FDA approved for NASAL specimens only), is one component of a comprehensive MRSA colonization surveillance program. It is not intended to diagnose MRSA infection nor to guide or monitor treatment for MRSA infections. Performed at [facility], [address] [place], [place] [address]   Culture, respiratory (NON-Expectorated)     Status: None   Collection Time: 11/26/17  9:29 AM  Result Value Ref Range Status   Specimen Description   Final    TRACHEAL ASPIRATE Performed at [facility], [address], [place], [place] [address]    Special Requests   Final    NONE Performed at [facility], [address] [place], [place] [address]    Gram Stain   Final    MODERATE WBC PRESENT,BOTH PMN AND MONONUCLEAR NO SQUAMOUS EPITHELIAL CELLS SEEN ABUNDANT GRAM POSITIVE COCCI IN CLUSTERS Performed at [facility], [place] [address], [place], [place] [address]    Culture ABUNDANT STAPHYLOCOCCUS AUREUS  Final   Report Status 11/28/2017 FINAL  Final   Organism ID, Bacteria STAPHYLOCOCCUS AUREUS  Final      Susceptibility   Staphylococcus aureus - MIC*    CIPROFLOXACIN <=0.5 SENSITIVE Sensitive     ERYTHROMYCIN >=8 RESISTANT Resistant     GENTAMICIN <=0.5 SENSITIVE Sensitive     OXACILLIN 0.5 SENSITIVE Sensitive     TETRACYCLINE <=1 SENSITIVE Sensitive     VANCOMYCIN <=0.5 SENSITIVE Sensitive     TRIMETH/SULFA <=10 SENSITIVE Sensitive     CLINDAMYCIN <=0.25 SENSITIVE Sensitive     RIFAMPIN <=0.5  SENSITIVE Sensitive     Inducible Clindamycin NEGATIVE Sensitive     * ABUNDANT STAPHYLOCOCCUS AUREUS  CULTURE, BLOOD (ROUTINE X 2) w Reflex to ID Panel     Status: None (Preliminary result)   Collection Time:  11/26/17  2:48 PM  Result Value Ref Range Status   Specimen Description BLOOD RT HAND  Final   Special Requests   Final    BOTTLES DRAWN AEROBIC AND ANAEROBIC Blood Culture adequate volume   Culture   Final    NO GROWTH 2 DAYS Performed at [facility], [address] [place], [place] [address]    Report Status PENDING  Incomplete  CULTURE, BLOOD (ROUTINE X 2) w Reflex to ID Panel     Status: None (Preliminary result)   Collection Time: 11/26/17  2:48 PM  Result Value Ref Range Status   Specimen Description BLOOD LT HAND  Final   Special Requests   Final    BOTTLES DRAWN AEROBIC AND ANAEROBIC Blood Culture adequate volume   Culture   Final    NO GROWTH 2 DAYS Performed at [facility], [address] [place], [place] [address]    Report Status PENDING  Incomplete    Radiology Reports Dg Chest 1 View  Result Date: 11/27/2017 CLINICAL DATA:  67 y/o  M; pulmonary edema. EXAM: CHEST  1 VIEW COMPARISON:  11/26/2017 chest radiograph FINDINGS: Stable cardiomegaly given projection and technique. Stable endotracheal tube 3.8 cm above the carina. Diminished interstitial pulmonary edema. No focal consolidation. No pleural effusion or pneumothorax. Bones are unremarkable. IMPRESSION: Diminished interstitial pulmonary edema. No new consolidation. Stable endotracheal tube. Electronically Signed   By: Kristine Garbe M.D.   On: 11/27/2017 04:38   Dg Chest Port 1 View  Result Date: 11/28/2017 CLINICAL DATA:  67 year old male with respiratory failure. Subsequent encounter. EXAM: PORTABLE CHEST 1 VIEW COMPARISON:  11/27/2017 chest x-ray. FINDINGS: Endotracheal tube tip 2.8 cm above the carina. Nasogastric tube courses below the diaphragm. Tip is not included on the present exam. Mild cardiomegaly. Pulmonary vascular congestion greatest centrally and lower lobe region similar to prior exam. No pneumothorax detected. IMPRESSION: Similar appearance of mild pulmonary vascular  congestion. Electronically Signed   By: Genia Del M.D.   On: 11/28/2017 06:54   Dg Chest Port 1 View  Result Date: 11/26/2017 CLINICAL DATA:  Respiratory failure. Patient with hypercapnic respiratory failure. Presently on mechanical ventilationHx of CHF, smoker, COPD, diabetes, NICM EXAM: PORTABLE CHEST 1 VIEW COMPARISON:  11/25/2017 FINDINGS: New endotracheal tube tip projects 3.5 cm above the Carina. Nasal/orogastric tube is stable passing below the diaphragm into the stomach. Lungs demonstrate vascular congestion and interstitial thickening, increased when compared to the prior exam. Cardiac silhouette is mildly enlarged. No convincing pleural effusion.  No pneumothorax. IMPRESSION: 1. Well-positioned endotracheal tube, tip 3.5 cm above the Carina. 2. Worsened lung aeration consistent with increased interstitial pulmonary edema. Electronically Signed   By: Lajean Manes M.D.   On: 11/26/2017 11:50   Dg Chest Port 1 View  Result Date: 11/25/2017 CLINICAL DATA:  Respiratory failure EXAM: PORTABLE CHEST 1 VIEW COMPARISON:  11/24/2017 FINDINGS: Cardiac shadow is enlarged but stable. The gastric catheter is noted extending towards the stomach. The endotracheal tube is again noted above the thoracic inlet and certainly could be advanced several cm. The lungs are clear bilaterally. This has improved significantly in the interval from the prior exam. No bony abnormality is noted. IMPRESSION: Significant improvement in vascular congestion and pulmonary edema. No focal  infiltrate is seen. Tubes and lines as described. Electronically Signed   By: Inez Catalina M.D.   On: 11/25/2017 06:59   Dg Chest Port 1 View  Result Date: 11/24/2017 CLINICAL DATA:  Shortness of breath, post intubation EXAM: PORTABLE CHEST 1 VIEW COMPARISON:  05/30/2017 FINDINGS: Endotracheal tube tip possibly visible at the thoracic inlet. Poorly visible distal esophageal tubing, appears to course below the diaphragm but the tip is not well  seen. Cardiomegaly with vascular congestion and moderate diffuse interstitial opacity concerning for pulmonary edema. No pneumothorax. IMPRESSION: 1. Tip of the endotracheal tube appears to be positioned at or slightly above the thoracic inlet 2. Poor visibility of the distal portion of the esophageal tube, appears to course below the diaphragm but tip is not included 3. Cardiomegaly with vascular congestion and moderate diffuse pulmonary edema Electronically Signed   By: Donavan Foil M.D.   On: 11/24/2017 02:48   Dg Abd Portable 1v  Result Date: 11/24/2017 CLINICAL DATA:  OG tube placement. EXAM: PORTABLE ABDOMEN - 1 VIEW COMPARISON:  None. FINDINGS: Interval placement of an OG tube with the tip likely in the gastric antrum. Nonobstructive bowel gas pattern. The lung bases are clear. IMPRESSION: OG tube with the tip likely in the gastric antrum. Electronically Signed   By: Titus Dubin M.D.   On: 11/24/2017 16:53     CBC Recent Labs  Lab 11/24/17 0202 11/26/17 0628 11/26/17 2045 11/28/17 0529  WBC 7.8 7.5 8.4 7.5  HGB 13.5 12.5* 12.3* 11.6*  HCT 41.3 38.3* 37.4* 34.8*  PLT 226 148* 127* 136*  MCV 97.2 95.1 95.1 94.7  MCH 31.7 31.2 31.3 31.5  MCHC 32.6 32.8 32.9 33.3  RDW 16.7* 16.2* 16.5* 16.1*  LYMPHSABS 4.3*  --   --   --   MONOABS 0.6  --   --   --   EOSABS 0.1  --   --   --   BASOSABS 0.1  --   --   --     Chemistries  Recent Labs  Lab 11/24/17 0202 11/25/17 0438 11/26/17 0522 11/26/17 2045 11/28/17 0529  NA 141 140 140 139 140  K 4.1 4.9 5.2* 4.0 4.0  CL 107 109 111 106 109  CO2 _0 GLUCOSE 358* 133* 104* 153* 150*  BUN 36* 59* 62* 51* 61*  CREATININE 1.62* 1.71* 1.69* 1.43* 1.40*  CALCIUM 8.7* 8.7* 9.0 8.7* 8.6*  MG  --  2.0  --  2.2  --    ------------------------------------------------------------------------------------------------------------------ estimated creatinine clearance is 61.8 mL/min (A) (by C-G formula based on SCr of 1.4 mg/dL  (H)). ------------------------------------------------------------------------------------------------------------------ No results for input(s): HGBA1C in the last 72 hours. ------------------------------------------------------------------------------------------------------------------ Recent Labs    11/27/17 0524  TRIG 363*   ------------------------------------------------------------------------------------------------------------------ No results for input(s): TSH, T4TOTAL, T3FREE, THYROIDAB in the last 72 hours.  Invalid input(s): FREET3 ------------------------------------------------------------------------------------------------------------------ No results for input(s): VITAMINB12, FOLATE, FERRITIN, TIBC, IRON, RETICCTPCT in the last 72 hours.  Coagulation profile No results for input(s): INR, PROTIME in the last 168 hours.  No results for input(s): DDIMER in the last 72 hours.  Cardiac Enzymes Recent Labs  Lab 11/24/17 1049 11/24/17 1402 11/25/17 0438  TROPONINI 0.30* 0.13* 0.31*   ------------------------------------------------------------------------------------------------------------------ Invalid input(s): POCBNP    Assessment & Plan   This is a 67 year old male admitted for acute respiratory failure with hypoxia. 1.  Acute Respiratory failure: Acute; with hypoxia due to pulmonary edema secondary to acute on chronic systolic heart failure.  Repeat echo shows EF 25% that is significantly dropped  Continue ventilator support, appreciate cardiology input, IV Lasix dose per cardiology 2.    Possible pulmonary infection continue IV antibiotic 3.  COPD: Continue nebulizer therapy 4.  Diabetes mellitus type 2: Continue basal insulin therapy as well as sliding scale insulin while hospitalized.   5.  DVT prophylaxis: Lovenox 6.  GI prophylaxis: Initiate PPI 24 hours after intubation The patient is a full code.        Code Status Orders  (From admission,  onward)        Start     Ordered   11/24/17 0334  Full code  Continuous     11/24/17 0333    Code Status History    Date Active Date Inactive Code Status Order ID Comments User Context   05/31/2017 0252 06/01/2017 2203 Full Code [id number]  Harrie Foreman, MD Inpatient   03/31/2016 2359 04/01/2016 1247 Full Code [id number]  Lance Coon, MD Inpatient           Consults intensivist  DVT Prophylaxis  Lovenox   Lab Results  Component Value Date   PLT 136 (L) 11/28/2017     Time Spent in minutes 35 minutes  Dustin Flock M.D on 11/28/2017 at 3:15 PM  Between 7am to 6pm - Pager - [phone]  After 6pm go to [email or web address] - Proofreader  [employer]   Office  [phone]

## 2017-11-28 NOTE — Care Management (Addendum)
LTAC screen requested from [place] with [facility] and [place] with [employer]. Information provided regarding both facilities to patient's wife for review. At 1210P: I received notification from [place] with [facility] that patient does not have Medicare Part and they will not be able to accept. Part B is needed for LTAC.  No LTAC benefit.

## 2017-11-28 NOTE — Progress Notes (Signed)
1710 Good day. More alert. Extubated at 1708. Still groggy but follows commands and speaks slowly. Wife called and informed of extubation. Temperature up and down Tylenol x 1 for 101. Good output from Lasix given this morning.

## 2017-11-28 NOTE — Progress Notes (Signed)
[facility] Critical Care Medicine Progess Note    CC  follow up Resp failure  HPI Remains intubated,sedated Critically ill   VENTILATOR SETTINGS: Vent Mode: PRVC FiO2 (%):  [30 %] 30 % Set Rate:  [20 bmp-30 bmp] 20 bmp Vt Set:  [500 mL] 500 mL PEEP:  [5 cmH20] 5 cmH20 Plateau Pressure:  [22 cmH20] 22 cmH20  CINTAKE / OUTPUT:  Intake/Output Summary (Last 24 hours) at 11/28/2017 0417 Last data filed at 11/28/2017 0400 Gross per 24 hour  Intake 1339.19 ml  Output 2720 ml  Net -1380.81 ml    Name: Jorge Cruz MRN: [id number] DOB: Aug 30, 1950    ADMISSION DATE:  11/24/2017   SUBJECTIVE:   Pt currently on the ventilator, can not provide history or review of systems.   VITAL SIGNS: Temp:  [98.8 F (37.1 C)-102.4 F (39.1 C)] 98.8 F (37.1 C) (04/09 0400) Pulse Rate:  [79-99] 84 (04/09 0200) Resp:  [16-30] 23 (04/09 0400) BP: (103-129)/(48-75) 109/60 (04/09 0400) SpO2:  [92 %-100 %] 100 % (04/09 0313) FiO2 (%):  [30 %] 30 % (04/09 0313)   Review of Systems: Unobtainable due to crticall illness   Physical Examination:   VS: BP 109/60   Pulse 84   Temp 98.8 F (37.1 C)   Resp (!) 23   Ht 5\' 10"  (1.778 m)   Wt 222 lb 10.6 oz (101 kg)   SpO2 100%   BMI 31.95 kg/m   General Appearance:resp failure, orally itubated  Neuro:patient is presently sedated on propofol, limited exam at this time HEENT: oral endotracheal tube in place, oral gastric tube Pulmonary: normal breath sounds   CardiovascularNormal S1,S2.  No m/r/g.   Abdomen: positive bowel sounds, soft exam. Skin:   warm, no rashes, no ecchymosis  Extremities: normal, no cyanosis, clubbing. GCS<8T   LABORATORY PANEL:   CBC Recent Labs  Lab 11/26/17 2045  WBC 8.4  HGB 12.3*  HCT 37.4*  PLT 127*    Chemistries  Recent Labs  Lab 11/26/17 2045  NA 139  K 4.0  CL 106  CO2 26  GLUCOSE 153*  BUN 51*  CREATININE 1.43*  CALCIUM 8.7*  MG 2.2  PHOS 3.7    Recent Labs  Lab  11/27/17 0410 11/27/17 0735 11/27/17 1154 11/27/17 1627 11/27/17 1959 11/27/17 2342  GLUCAP 130* 112* 122* 209* 187* 193*   Recent Labs  Lab 11/24/17 0313 11/24/17 0614 11/25/17 0502  PHART 7.12* 7.31* 7.31*  PCO2ART 81* 49* 45  PO2ART 83 264* 104   No results for input(s): AST, ALT, ALKPHOS, BILITOT, ALBUMIN in the last 168 hours.  Cardiac Enzymes Recent Labs  Lab 11/25/17 0438  TROPONINI 0.31*    SYNOPSIS   67 year old gentleman with a past medical history remarkable for heart failure with reduced ejection fraction, last measured as 20-25%, COPD, diabetes, hypertension, polysubstance abuse,tobacco abuse was brought into the emergency department by EMS secondary to acute onset of shortness of breath. Patient with severe resp failure from acute and severe CHF exacerbation.    ASSESSMENT/PLAN   1.Severe Respiratory failure. Patient with hypercapnic respiratory failure. Presently on mechanical ventilation.  Chest x-ray did not show clear pneumonia more consistent with cardiomegaly and heart failure.   Diuresis as tolerated  2.Renal insufficiency. BUN and creatinine  this morning is 62 and 1.69 which is essentially stable.Marland Kitchen   3.Hyperglycemia. Placed on sliding scale with improved glucoses  Plan for SAT/SBT today    Critical Care Time devoted to patient care services described  in this note is 35 minutes.   Overall, patient is critically ill, prognosis is guarded.  Patient with Multiorgan failure and at high risk for cardiac arrest and death.    Lucie Leather, M.D.  [facility]  Medical Director [facility] Medical Director [facility] Cardio-Pulmonary Department

## 2017-11-28 NOTE — Progress Notes (Signed)
Discussed case with RT, patient with slight increased WOB NTS produced lots of secretions, DOUNEB given  Recommend aggressive chest PT, NTS, change dounebs every 4 hrs.  ABG 7.34/55 Start Solumedrol May need to use BiPAP     Lucie Leather, M.D.  [facility]  Medical Director [facility] Medical Director [facility] Cardio-Pulmonary Department

## 2017-11-29 DIAGNOSIS — J810 Acute pulmonary edema: Secondary | ICD-10-CM

## 2017-11-29 DIAGNOSIS — J15211 Pneumonia due to Methicillin susceptible Staphylococcus aureus: Secondary | ICD-10-CM

## 2017-11-29 DIAGNOSIS — J441 Chronic obstructive pulmonary disease with (acute) exacerbation: Secondary | ICD-10-CM

## 2017-11-29 DIAGNOSIS — J9602 Acute respiratory failure with hypercapnia: Secondary | ICD-10-CM

## 2017-11-29 DIAGNOSIS — I428 Other cardiomyopathies: Secondary | ICD-10-CM

## 2017-11-29 LAB — BRAIN NATRIURETIC PEPTIDE: B Natriuretic Peptide: 539 pg/mL — ABNORMAL HIGH (ref 0.0–100.0)

## 2017-11-29 LAB — GLUCOSE, CAPILLARY
Glucose-Capillary: 109 mg/dL — ABNORMAL HIGH (ref 65–99)
Glucose-Capillary: 118 mg/dL — ABNORMAL HIGH (ref 65–99)
Glucose-Capillary: 131 mg/dL — ABNORMAL HIGH (ref 65–99)
Glucose-Capillary: 204 mg/dL — ABNORMAL HIGH (ref 65–99)
Glucose-Capillary: 164 mg/dL — ABNORMAL HIGH (ref 65–99)

## 2017-11-29 MED ORDER — INSULIN ASPART 100 UNIT/ML ~~LOC~~ SOLN
0.0000 [IU] | Freq: Three times a day (TID) | SUBCUTANEOUS | Status: DC
  Administered 2017-11-29: 18:00:00 2 [IU] via SUBCUTANEOUS
  Administered 2017-11-29: 5 [IU] via SUBCUTANEOUS
  Administered 2017-12-01: 08:00:00 8 [IU] via SUBCUTANEOUS
  Filled 2017-11-29 (×3): qty 1

## 2017-11-29 MED ORDER — IPRATROPIUM-ALBUTEROL 0.5-2.5 (3) MG/3ML IN SOLN: 3.0000 mL | RESPIRATORY_TRACT | Status: DC | PRN

## 2017-11-29 MED ORDER — GUAIFENESIN-DM 100-10 MG/5ML PO SYRP
5.0000 mL | ORAL_SOLUTION | ORAL | Status: DC | PRN
  Administered 2017-11-29: 5 mL via ORAL
  Filled 2017-11-29 (×2): qty 5

## 2017-11-29 MED ORDER — ACETAMINOPHEN 325 MG PO TABS
650.0000 mg | ORAL_TABLET | Freq: Four times a day (QID) | ORAL | Status: DC | PRN
  Administered 2017-11-29: 650 mg via ORAL
  Filled 2017-11-29: qty 2

## 2017-11-29 MED ORDER — INSULIN ASPART 100 UNIT/ML ~~LOC~~ SOLN: 0.0000 [IU] | Freq: Every day | SUBCUTANEOUS | Status: DC

## 2017-11-29 MED ORDER — PREMIER PROTEIN SHAKE
11.0000 [oz_av] | Freq: Two times a day (BID) | ORAL | Status: DC
  Administered 2017-11-29: 11 [oz_av] via ORAL

## 2017-11-29 MED ORDER — CARVEDILOL 3.125 MG PO TABS
3.1250 mg | ORAL_TABLET | Freq: Two times a day (BID) | ORAL | Status: DC
  Administered 2017-11-29 – 2017-12-01 (×4): 3.125 mg via ORAL
  Filled 2017-11-29 (×4): qty 1

## 2017-11-29 MED ORDER — SACUBITRIL-VALSARTAN 24-26 MG PO TABS
1.0000 | ORAL_TABLET | Freq: Two times a day (BID) | ORAL | Status: DC
  Administered 2017-11-29 – 2017-12-01 (×5): 1 via ORAL
  Filled 2017-11-29 (×5): qty 1

## 2017-11-29 NOTE — Progress Notes (Signed)
Pt transferred to 1C, room 109.  Report called to [place], Charity fundraiser.  Pt on room air, chart and meds transported with him, no personal clothing or belongings in room, transported in his recliner.  Placed on tele box 38, CCMD notified of move and new box# by Clinical research associate.  Patient agreeable to transfer.  Opted to get in bed when he arrived to unit.  RN Josh in room 109 when writer left.

## 2017-11-29 NOTE — Progress Notes (Signed)
Pharmacy Antibiotic Note  Jorge Cruz is a 67 y.o. male admitted on 11/24/2017 with acute respiratory failure. Pharmacy has been consulted for cefazolin dosing. Sputum culture showing MSSA. Patient previously received 3 days on Vancomycin.  Plan: Initiate Cefazolin 2g IV q8h.  Height: 5\' 10"  (177.8 cm) Weight: 222 lb 10.6 oz (101 kg) IBW/kg (Calculated) : 73  Temp (24hrs), Avg:100.1 F (37.8 C), Min:93.9 F (34.4 C), Max:102.4 F (39.1 C)  Recent Labs  Lab 11/24/17 0202 11/25/17 0438 11/26/17 0522 11/26/17 0628 11/26/17 2045 11/28/17 0529  WBC 7.8  --   --  7.5 8.4 7.5  CREATININE 1.62* 1.71* 1.69*  --  1.43* 1.40*    Estimated Creatinine Clearance: 61.8 mL/min (A) (by C-G formula based on SCr of 1.4 mg/dL (H)).    No Known Allergies  Antimicrobials this admission: Cefepime 4/7 >> 4/8 Vancomycin 4/7  >> 4/9 Cefazolin 4/9 >>  Dose adjustments this admission: N/A  Microbiology results: 4/7 BCx: no growth 3 days 4/7 TA: MSSA 4/5 MRSA PCR: negative  Thank you for allowing pharmacy to be a part of this patient's care.  Simpson,Michael L 11/29/2017 9:03 AM

## 2017-11-29 NOTE — Progress Notes (Signed)
Nutrition Follow-up  DOCUMENTATION CODES:   Obesity unspecified  INTERVENTION:  Provide Premier Protein po BID, each supplement provides 160 kcal and 30 grams of protein.  Left "Heart Healthy, Consistent Carbohydrate Nutrition Therapy" handout from the Academy of [employer] Diet[employer] with patient and family to review per his request. He became tearful talking about his brother so was not able to proceed with full education. Left RD contact information and encouraged him to call with any questions.  Discussed patient's increased needs for calories and protein. Encouraged adequate intake of protein at meals.   NUTRITION DIAGNOSIS:   Increased nutrient needs related to catabolic illness(COPD, CHF, catabolic nature of critical illness requiring mechanical intubation) as evidenced by estimated needs.  New nutrition diagnosis.  GOAL:   Patient will meet greater than or equal to 90% of their needs  Progressing.  MONITOR:   PO intake, Supplement acceptance, Labs, Weight trends, I & O's  REASON FOR ASSESSMENT:   Ventilator    ASSESSMENT:   67 y.o. male with h/o COPD and congestive heart failure presents to the emergency department via EMS with acute respiratory distress.   -Patient was extubated on 4/9. OGT was removed.  Discussed with RN. Patient has a diet now. He is asking about what to eat for his heart healthy/carbohydrate modified diet.  Met with patient and 2 family members at bedside. Patient had finished <25% of tray at bedside. He reports his appetite is fine but he does not like the taste of the food. He would like Mrs. Dash seasoning but they did not provide him with any. Encouraged him to ask for Mrs. Dash with each meal order as it is allowed on his diet. He had a good appetite and intake PTA. He is amenable to drinking Premier Protein one daily to help meet protein needs.  Medications reviewed and include: carvedilol, Colace, Novolog 0-15 units TID, Novolog 0-5  units QHS, Lantus 30 units daily, senna-docusate, Ancef.  Labs reviewed: CBG 104-212 past 24 hrs, BNP 539. On 4/9 BUN 61, Creatinine 1.4.  I/O: 2325 mL UOP yesterday (1 mL/kg/hr)  No weight since admission to trend  Discussed on rounds.   Diet Order:  Diet heart healthy/carb modified Room service appropriate? Yes; Fluid consistency: Thin  EDUCATION NEEDS:   Education needs have been addressed  Skin:  Skin Assessment: Reviewed RN Assessment  Last BM:  11/29/2017 - small type 1  Height:   Ht Readings from Last 1 Encounters:  11/24/17 _0  (1.778 m)    Weight:   Wt Readings from Last 1 Encounters:  11/24/17 222 lb 10.6 oz (101 kg)    Ideal Body Weight:  75.4 kg  BMI:  Body mass index is 31.95 kg/m.  Estimated Nutritional Needs:   Kcal:  2160-2340 (MSJ x 1.2-1.3)  Protein:  110-130 grams (1.1-1.3 grams/kg)  Fluid:  1.9 L/day (25 mL/kg IBW)  Willey Blade, MS, RD, LDN Office: [phone] Pager: [phone] After Hours/Weekend Pager: [phone]

## 2017-11-29 NOTE — Progress Notes (Signed)
Progress Note  Patient Name: Jorge Cruz Date of Encounter: 11/29/2017  Primary Cardiologist: Yvonne Kendall, MD   Subjective   Extubated , communicative, reports feeling much better Hungry, wants to eat Denies any chest pain Feels his breathing is not at his baseline Sputum culture MSSA On antibiotics vancomycin ceftezolin  Discussed outpatient medications with him in detail Reports he goes to [facility] health care clinic gets all of his medications through there and including Entresto  Inpatient Medications    Scheduled Meds: . carvedilol  3.125 mg Oral BID WC  . docusate  100 mg Per Tube BID  . enoxaparin (LOVENOX) injection  40 mg Subcutaneous Q24H  . insulin aspart  0-15 Units Subcutaneous TID WC  . insulin aspart  0-5 Units Subcutaneous QHS  . insulin glargine  30 Units Subcutaneous Q24H  . nitroGLYCERIN  0.3 mg Transdermal Daily  . sacubitril-valsartan  1 tablet Oral BID  . senna-docusate  2 tablet Per Tube BID   Continuous Infusions: .  ceFAZolin (ANCEF) IV 2 g (11/29/17 4696)   PRN Meds: acetaminophen **OR** [DISCONTINUED] acetaminophen, ipratropium-albuterol, [DISCONTINUED] ondansetron **OR** ondansetron (ZOFRAN) IV   Vital Signs    Vitals:   11/29/17 0744 11/29/17 0800 11/29/17 0804 11/29/17 0900  BP:  131/75 131/75 (!) 146/73  Pulse:  94 91 91  Resp:  15  (!) 22  Temp:  99 F (37.2 C)  98.2 F (36.8 C)  TempSrc:  Bladder  Bladder  SpO2: 95% 95%  92%  Weight:      Height:        Intake/Output Summary (Last 24 hours) at 11/29/2017 1020 Last data filed at 11/29/2017 0800 Gross per 24 hour  Intake 352 ml  Output 2300 ml  Net -1948 ml   Filed Weights   11/24/17 0201 11/24/17 0431  Weight: 225 lb (102.1 kg) 222 lb 10.6 oz (101 kg)    Telemetry    Normal sinus rhythm- Personally Reviewed  ECG      Physical Exam   Constitutional:  oriented to person, place, and time. No distress.  HENT:  Head: Normocephalic and atraumatic.  Eyes:  no  discharge. No scleral icterus.  Neck: Normal range of motion. Neck supple. No JVD present.  Cardiovascular: Normal rate, regular rhythm, normal heart sounds and intact distal pulses. Exam reveals no gallop and no friction rub. No edema No murmur heard. Pulmonary/Chest: Effort normal and breath sounds normal. No stridor. No respiratory distress.  no wheezes.  no rales.  no tenderness.  Abdominal: Soft.  no distension.  no tenderness.  Musculoskeletal: Normal range of motion.  no  tenderness or deformity.  Neurological:  normal muscle tone. Coordination normal. No atrophy Skin: Skin is warm and dry. No rash noted. not diaphoretic.  Psychiatric:  normal mood and affect. behavior is normal. Thought content normal.    Labs    Chemistry Recent Labs  Lab 11/26/17 0522 11/26/17 2045 11/28/17 0529  NA 140 139 140  K 5.2* 4.0 4.0  CL 111 106 109  CO2 23 26 26   GLUCOSE 104* 153* 150*  BUN 62* 51* 61*  CREATININE 1.69* 1.43* 1.40*  CALCIUM 9.0 8.7* 8.6*  GFRNONAA 41* 50* 51*  GFRAA 47* 57* 59*  ANIONGAP 6 7 5      Hematology Recent Labs  Lab 11/26/17 0628 11/26/17 2045 11/28/17 0529  WBC 7.5 8.4 7.5  RBC 4.02* 3.94* 3.68*  HGB 12.5* 12.3* 11.6*  HCT 38.3* 37.4* 34.8*  MCV 95.1 95.1 94.7  MCH 31.2 31.3 31.5  MCHC 32.8 32.9 33.3  RDW 16.2* 16.5* 16.1*  PLT 148* 127* 136*    Cardiac Enzymes Recent Labs  Lab 11/24/17 0202 11/24/17 1049 11/24/17 1402 11/25/17 0438  TROPONINI 0.08* 0.30* 0.13* 0.31*   No results for input(s): TROPIPOC in the last 168 hours.   BNP Recent Labs  Lab 11/24/17 0202  BNP 519.0*     DDimer No results for input(s): DDIMER in the last 168 hours.   Radiology    Dg Chest Port 1 View  Result Date: 11/28/2017 CLINICAL DATA:  67 year old male with respiratory failure. Subsequent encounter. EXAM: PORTABLE CHEST 1 VIEW COMPARISON:  11/27/2017 chest x-ray. FINDINGS: Endotracheal tube tip 2.8 cm above the carina. Nasogastric tube courses below the  diaphragm. Tip is not included on the present exam. Mild cardiomegaly. Pulmonary vascular congestion greatest centrally and lower lobe region similar to prior exam. No pneumothorax detected. IMPRESSION: Similar appearance of mild pulmonary vascular congestion. Electronically Signed   By: Lacy Duverney M.D.   On: 11/28/2017 06:54    Cardiac Studies   Echo Left ventricle: The cavity size was mildly dilated. There was   mild concentric hypertrophy. Systolic function was severely   reduced. The estimated ejection fraction was less than 20%.   Diffuse hypokinesis. Regional wall motion abnormalities cannot be   excluded. Features are consistent with a pseudonormal left   ventricular filling pattern, with concomitant abnormal relaxation   and increased filling pressure (grade 2 diastolic dysfunction). - Left atrium: The atrium was normal in size. - Right ventricle: Systolic function was mildly to moderately   reduced. - Pulmonary arteries: Systolic pressure was mildly elevated PA peak   pressure: 35 mm Hg (S).  Patient Profile     67 y.o. male NICM, combined syst/diast CHF, CKD III, HTN, DM, COPD, tob abuse, polysubstance abuse, noncompliance, and non-obstructive CAD, admitted with acute respiratory failure with hypoxia.  Assessment & Plan    1) acute respiratory distress Extubated yesterday Suspect COPD exacerbation in the setting of MSSA sputum on aggressive antibiotic regimen pneumonia  2) chronic systolic and diastolic heart failure Not sure to what degree this is playing a role Does not look grossly fluid overloaded, suspect admission may be more pulmonary etiology Extubated, improved symptomatically Renal function stable Would restart entresto low-dose as he was taking as an outpatient Continue beta-blocker Gentle diuresis With close monitoring of renal function  3) Chronic kidney disease stage III We will restart outpatient medications Gentle diuresis  Long discussion  with him concerning outpatient medications Case discussed with Dr. Sung Amabile who agrees we should restart outpatient medications at this time Transfer to the floor  Total encounter time more than 35 minutes  Greater than 50% was spent in counseling and coordination of care with the patient   For questions or updates, please contact [facility] Please consult [email or web address] for contact info under Cardiology/STEMI.      Signed, Julien Nordmann, MD  11/29/2017, 10:20 AM

## 2017-11-29 NOTE — Progress Notes (Signed)
Pt on room air, O2 sats adequate, Up to [facility] for BM, foley DCd, pt urinating in urinal, A&O, would like to go to floor, writer has explained he may not get a floor bed today.  Wife at bedside most of shift.

## 2017-11-29 NOTE — Progress Notes (Signed)
Patient has done very well since extubation yesterday.  Presently comfortable on room air.  His cardiomyopathy is being managed by cardiology.  I have placed order for transfer to MedSurg floor with cardiac monitoring.  After transfer, PCCM will sign off. Please call if we can be of further assistance    Billy Fischer, MD PCCM service Mobile [phone] Pager [phone] 11/29/2017 3:24 PM

## 2017-11-29 NOTE — Progress Notes (Signed)
[employer] - [place] [facility]                                                                                                                                                                                  Patient Demographics   Jorge Cruz, is a 67 y.o. male, DOB - 09-Aug-1951, RUE:[id number]  Admit date - 11/24/2017   Admitting Physician Arnaldo Natal, MD  Outpatient Primary MD for the patient is [facility]   LOS - 5  Subjective: Patient extubated coughing otherwise feeling better    Review of Systems:      CONSTITUTIONAL: No documented fever. No fatigue, weakness. No weight gain, no weight loss.  EYES: No blurry or double vision.  ENT: No tinnitus. No postnasal drip. No redness of the oropharynx.  RESPIRATORY: Positive cough, no wheeze, no hemoptysis. No dyspnea.  CARDIOVASCULAR: No chest pain. No orthopnea. No palpitations. No syncope.  GASTROINTESTINAL: No nausea, no vomiting or diarrhea. No abdominal pain. No melena or hematochezia.  GENITOURINARY:  No urgency. No frequency. No dysuria. No hematuria. No obstructive symptoms. No discharge. No pain. No significant abnormal bleeding ENDOCRINE: No polyuria or nocturia. No heat or cold intolerance.  HEMATOLOGY: No anemia. No bruising. No bleeding. No purpura. No petechiae INTEGUMENTARY: No rashes. No lesions.  MUSCULOSKELETAL: No arthritis. No swelling. No gout.  NEUROLOGIC: No numbness, tingling, or ataxia. No seizure-type activity.  PSYCHIATRIC: No anxiety. No insomnia. No ADD.    Vitals:   Vitals:   11/29/17 0900 11/29/17 1000 11/29/17 1100 11/29/17 1200  BP: (!) 146/73 (!) 80/68 (!) 135/95 (!) 148/68  Pulse: 91 94 95 95  Resp: (!) 22 16 (!) 21 20  Temp: 98.2 F (36.8 C) 98.8 F (37.1 C) 99 F (37.2 C) 99.5 F (37.5 C)  TempSrc: Bladder   Bladder  SpO2: 92% 91% (!) 87% 92%  Weight:      Height:        Wt Readings from Last 3 Encounters:  11/24/17 101 kg (222 lb 10.6  oz)  09/13/17 101.8 kg (224 lb 6 oz)  07/26/17 100.8 kg (222 lb 4 oz)     Intake/Output Summary (Last 24 hours) at 11/29/2017 1515 Last data filed at 11/29/2017 1400 Gross per 24 hour  Intake 589 ml  Output 1625 ml  Net -1036 ml    Physical Exam:   GENERAL: No acute distress HEAD, EYES, EARS, NOSE AND THROAT: Atraumatic, normocephalic. Extraocular muscles are intact. Pupils equal and reactive to light. Sclerae anicteric. No conjunctival injection. No oro-pharyngeal erythema.  NECK: Supple. There is no jugular venous distention. No bruits, no lymphadenopathy, no thyromegaly.  HEART: Regular rate  and rhythm,. No murmurs, no rubs, no clicks.  LUNGS: Bilateral rhonchus breath sounds ABDOMEN: Soft, flat, nontender, nondistended. Has good bowel sounds. No hepatosplenomegaly appreciated.  EXTREMITIES: No evidence of any cyanosis, clubbing, or peripheral edema.  +2 pedal and radial pulses bilaterally.  NEUROLOGIC: Awake following all commands SKIN: Moist and warm with no rashes appreciated.  Psych: Not anxious or depressed LN: No inguinal LN enlargement    Antibiotics   Anti-infectives (From admission, onward)   Start     Dose/Rate Route Frequency Ordered Stop   11/28/17 1400  ceFAZolin (ANCEF) IVPB 2g/100 mL premix     2 g 200 mL/hr over 30 Minutes Intravenous Every 8 hours 11/28/17 1214     11/27/17 0400  vancomycin (VANCOCIN) 1,250 mg in sodium chloride 0.9 % 250 mL IVPB  Status:  Discontinued     1,250 mg 166.7 mL/hr over 90 Minutes Intravenous Every 18 hours 11/26/17 2034 11/28/17 1159   11/26/17 2100  ceFEPIme (MAXIPIME) 2 g in sodium chloride 0.9 % 100 mL IVPB  Status:  Discontinued     2 g 200 mL/hr over 30 Minutes Intravenous Every 12 hours 11/26/17 2030 11/27/17 1654   11/26/17 2100  vancomycin (VANCOCIN) IVPB 1000 mg/200 mL premix     1,000 mg 200 mL/hr over 60 Minutes Intravenous  Once 11/26/17 2030 11/26/17 2330      Medications   Scheduled Meds: . carvedilol   3.125 mg Oral BID WC  . docusate  100 mg Per Tube BID  . enoxaparin (LOVENOX) injection  40 mg Subcutaneous Q24H  . insulin aspart  0-15 Units Subcutaneous TID WC  . insulin aspart  0-5 Units Subcutaneous QHS  . insulin glargine  30 Units Subcutaneous Q24H  . nitroGLYCERIN  0.3 mg Transdermal Daily  . protein supplement shake  11 oz Oral BID BM  . sacubitril-valsartan  1 tablet Oral BID  . senna-docusate  2 tablet Per Tube BID   Continuous Infusions: .  ceFAZolin (ANCEF) IV Stopped (11/29/17 1427)   PRN Meds:.acetaminophen **OR** [DISCONTINUED] acetaminophen, ipratropium-albuterol, [DISCONTINUED] ondansetron **OR** ondansetron (ZOFRAN) IV   Data Review:   Micro Results Recent Results (from the past 240 hour(s))  MRSA PCR Screening     Status: None   Collection Time: 11/24/17  4:31 AM  Result Value Ref Range Status   MRSA by PCR NEGATIVE NEGATIVE Final    Comment:        The GeneXpert MRSA Assay (FDA approved for NASAL specimens only), is one component of a comprehensive MRSA colonization surveillance program. It is not intended to diagnose MRSA infection nor to guide or monitor treatment for MRSA infections. Performed at [facility], [address] [place], [place] [address]   Culture, respiratory (NON-Expectorated)     Status: None   Collection Time: 11/26/17  9:29 AM  Result Value Ref Range Status   Specimen Description   Final    TRACHEAL ASPIRATE Performed at [facility], [address], [place], [place] [address]    Special Requests   Final    NONE Performed at [facility], [address] [place], [place] [address]    Gram Stain   Final    MODERATE WBC PRESENT,BOTH PMN AND MONONUCLEAR NO SQUAMOUS EPITHELIAL CELLS SEEN ABUNDANT GRAM POSITIVE COCCI IN CLUSTERS Performed at [facility], [address], [place], [place] [address]    Culture ABUNDANT STAPHYLOCOCCUS AUREUS  Final   Report Status 11/28/2017 FINAL  Final  Organism ID, Bacteria STAPHYLOCOCCUS AUREUS  Final      Susceptibility   Staphylococcus aureus - MIC*    CIPROFLOXACIN <=0.5 SENSITIVE Sensitive     ERYTHROMYCIN >=8 RESISTANT Resistant     GENTAMICIN <=0.5 SENSITIVE Sensitive     OXACILLIN 0.5 SENSITIVE Sensitive     TETRACYCLINE <=1 SENSITIVE Sensitive     VANCOMYCIN <=0.5 SENSITIVE Sensitive     TRIMETH/SULFA <=10 SENSITIVE Sensitive     CLINDAMYCIN <=0.25 SENSITIVE Sensitive     RIFAMPIN <=0.5 SENSITIVE Sensitive     Inducible Clindamycin NEGATIVE Sensitive     * ABUNDANT STAPHYLOCOCCUS AUREUS  CULTURE, BLOOD (ROUTINE X 2) w Reflex to ID Panel     Status: None (Preliminary result)   Collection Time: 11/26/17  2:48 PM  Result Value Ref Range Status   Specimen Description BLOOD RT HAND  Final   Special Requests   Final    BOTTLES DRAWN AEROBIC AND ANAEROBIC Blood Culture adequate volume   Culture   Final    NO GROWTH 3 DAYS Performed at [facility], [address], [place], [place] [address]    Report Status PENDING  Incomplete  CULTURE, BLOOD (ROUTINE X 2) w Reflex to ID Panel     Status: None (Preliminary result)   Collection Time: 11/26/17  2:48 PM  Result Value Ref Range Status   Specimen Description BLOOD LT HAND  Final   Special Requests   Final    BOTTLES DRAWN AEROBIC AND ANAEROBIC Blood Culture adequate volume   Culture   Final    NO GROWTH 3 DAYS Performed at [facility], [address] [place], [place] [address]    Report Status PENDING  Incomplete    Radiology Reports Dg Chest 1 View  Result Date: 11/27/2017 CLINICAL DATA:  67 y/o  M; pulmonary edema. EXAM: CHEST  1 VIEW COMPARISON:  11/26/2017 chest radiograph FINDINGS: Stable cardiomegaly given projection and technique. Stable endotracheal tube 3.8 cm above the carina. Diminished interstitial pulmonary edema. No focal consolidation. No pleural effusion or pneumothorax. Bones are unremarkable. IMPRESSION: Diminished interstitial pulmonary  edema. No new consolidation. Stable endotracheal tube. Electronically Signed   By: Mitzi Hansen M.D.   On: 11/27/2017 04:38   Dg Chest Port 1 View  Result Date: 11/28/2017 CLINICAL DATA:  67 year old male with respiratory failure. Subsequent encounter. EXAM: PORTABLE CHEST 1 VIEW COMPARISON:  11/27/2017 chest x-ray. FINDINGS: Endotracheal tube tip 2.8 cm above the carina. Nasogastric tube courses below the diaphragm. Tip is not included on the present exam. Mild cardiomegaly. Pulmonary vascular congestion greatest centrally and lower lobe region similar to prior exam. No pneumothorax detected. IMPRESSION: Similar appearance of mild pulmonary vascular congestion. Electronically Signed   By: Lacy Duverney M.D.   On: 11/28/2017 06:54   Dg Chest Port 1 View  Result Date: 11/26/2017 CLINICAL DATA:  Respiratory failure. Patient with hypercapnic respiratory failure. Presently on mechanical ventilationHx of CHF, smoker, COPD, diabetes, NICM EXAM: PORTABLE CHEST 1 VIEW COMPARISON:  11/25/2017 FINDINGS: New endotracheal tube tip projects 3.5 cm above the Carina. Nasal/orogastric tube is stable passing below the diaphragm into the stomach. Lungs demonstrate vascular congestion and interstitial thickening, increased when compared to the prior exam. Cardiac silhouette is mildly enlarged. No convincing pleural effusion.  No pneumothorax. IMPRESSION: 1. Well-positioned endotracheal tube, tip 3.5 cm above the Carina. 2. Worsened lung aeration consistent with increased interstitial pulmonary edema. Electronically Signed   By: Amie Portland M.D.   On: 11/26/2017 11:50  Dg Chest Port 1 View  Result Date: 11/25/2017 CLINICAL DATA:  Respiratory failure EXAM: PORTABLE CHEST 1 VIEW COMPARISON:  11/24/2017 FINDINGS: Cardiac shadow is enlarged but stable. The gastric catheter is noted extending towards the stomach. The endotracheal tube is again noted above the thoracic inlet and certainly could be advanced several cm.  The lungs are clear bilaterally. This has improved significantly in the interval from the prior exam. No bony abnormality is noted. IMPRESSION: Significant improvement in vascular congestion and pulmonary edema. No focal infiltrate is seen. Tubes and lines as described. Electronically Signed   By: Alcide Clever M.D.   On: 11/25/2017 06:59   Dg Chest Port 1 View  Result Date: 11/24/2017 CLINICAL DATA:  Shortness of breath, post intubation EXAM: PORTABLE CHEST 1 VIEW COMPARISON:  05/30/2017 FINDINGS: Endotracheal tube tip possibly visible at the thoracic inlet. Poorly visible distal esophageal tubing, appears to course below the diaphragm but the tip is not well seen. Cardiomegaly with vascular congestion and moderate diffuse interstitial opacity concerning for pulmonary edema. No pneumothorax. IMPRESSION: 1. Tip of the endotracheal tube appears to be positioned at or slightly above the thoracic inlet 2. Poor visibility of the distal portion of the esophageal tube, appears to course below the diaphragm but tip is not included 3. Cardiomegaly with vascular congestion and moderate diffuse pulmonary edema Electronically Signed   By: Jasmine Pang M.D.   On: 11/24/2017 02:48   Dg Abd Portable 1v  Result Date: 11/24/2017 CLINICAL DATA:  OG tube placement. EXAM: PORTABLE ABDOMEN - 1 VIEW COMPARISON:  None. FINDINGS: Interval placement of an OG tube with the tip likely in the gastric antrum. Nonobstructive bowel gas pattern. The lung bases are clear. IMPRESSION: OG tube with the tip likely in the gastric antrum. Electronically Signed   By: Obie Dredge M.D.   On: 11/24/2017 16:53     CBC Recent Labs  Lab 11/24/17 0202 11/26/17 0628 11/26/17 2045 11/28/17 0529  WBC 7.8 7.5 8.4 7.5  HGB 13.5 12.5* 12.3* 11.6*  HCT 41.3 38.3* 37.4* 34.8*  PLT 226 148* 127* 136*  MCV 97.2 95.1 95.1 94.7  MCH 31.7 31.2 31.3 31.5  MCHC 32.6 32.8 32.9 33.3  RDW 16.7* 16.2* 16.5* 16.1*  LYMPHSABS 4.3*  --   --   --    MONOABS 0.6  --   --   --   EOSABS 0.1  --   --   --   BASOSABS 0.1  --   --   --     Chemistries  Recent Labs  Lab 11/24/17 0202 11/25/17 0438 11/26/17 0522 11/26/17 2045 11/28/17 0529  NA 141 140 140 139 140  K 4.1 4.9 5.2* 4.0 4.0  CL 107 109 111 106 109  CO2 25 22 23 26 26   GLUCOSE 358* 133* 104* 153* 150*  BUN 36* 59* 62* 51* 61*  CREATININE 1.62* 1.71* 1.69* 1.43* 1.40*  CALCIUM 8.7* 8.7* 9.0 8.7* 8.6*  MG  --  2.0  --  2.2  --    ------------------------------------------------------------------------------------------------------------------ estimated creatinine clearance is 61.8 mL/min (A) (by C-G formula based on SCr of 1.4 mg/dL (H)). ------------------------------------------------------------------------------------------------------------------ No results for input(s): HGBA1C in the last 72 hours. ------------------------------------------------------------------------------------------------------------------ Recent Labs    11/27/17 0524  TRIG 363*   ------------------------------------------------------------------------------------------------------------------ No results for input(s): TSH, T4TOTAL, T3FREE, THYROIDAB in the last 72 hours.  Invalid input(s): FREET3 ------------------------------------------------------------------------------------------------------------------ No results for input(s): VITAMINB12, FOLATE, FERRITIN, TIBC, IRON, RETICCTPCT in the last 72 hours.  Coagulation  profile No results for input(s): INR, PROTIME in the last 168 hours.  No results for input(s): DDIMER in the last 72 hours.  Cardiac Enzymes Recent Labs  Lab 11/24/17 1049 11/24/17 1402 11/25/17 0438  TROPONINI 0.30* 0.13* 0.31*   ------------------------------------------------------------------------------------------------------------------ Invalid input(s): POCBNP    Assessment & Plan   This is a 67 year old male admitted for acute respiratory failure  with hypoxia. 1.  Acute Respiratory failure: Acute; with hypoxia due to pulmonary edema secondary to acute on chronic systolic heart failure.  Repeat echo shows EF 25% that is significantly dropped  Continue ventilator support, cardiology following, continue Entresto 2.    Pneumonia continue Ancef 3.  COPD: Continue nebulizer therapy 4.  Diabetes mellitus type 2: Continue basal insulin therapy as well as sliding scale insulin while hospitalized.   5.  DVT prophylaxis: Lovenox 6.  GI prophylaxis: Initiate PPI 24 hours after intubation The patient is a full code.        Code Status Orders  (From admission, onward)        Start     Ordered   11/24/17 0334  Full code  Continuous     11/24/17 0333    Code Status History    Date Active Date Inactive Code Status Order ID Comments User Context   05/31/2017 0252 06/01/2017 2203 Full Code [id number]  Arnaldo Natal, MD Inpatient   03/31/2016 2359 04/01/2016 1247 Full Code [id number]  Oralia Manis, MD Inpatient           Consults intensivist  DVT Prophylaxis  Lovenox   Lab Results  Component Value Date   PLT 136 (L) 11/28/2017     Time Spent in minutes 35 minutes  Auburn Bilberry M.D on 11/29/2017 at 3:15 PM  Between 7am to 6pm - Pager - [phone]  After 6pm go to [email or web address] - Social research officer, government  [employer]   Office  [phone]

## 2017-11-29 NOTE — Care Management (Signed)
RNCM received notification that [facility] can accept patient without Medicare Part B but [facility] will not be able to. RNCM left confidential message for patient's wife at [phone] to return RNCM call when available.

## 2017-11-29 NOTE — Progress Notes (Signed)
Called by RN for pt resp. distress. Pt has copious amounts of oral secretions, increased WOB and excessive coughing. Pt given SVN, NTS'd for copious amounts of secretions and obtained an ABG. Pt doing much better and remains on 4L [place].

## 2017-11-30 ENCOUNTER — Ambulatory Visit: Payer: Medicaid Other | Admitting: Family

## 2017-11-30 DIAGNOSIS — Z9911 Dependence on respirator [ventilator] status: Secondary | ICD-10-CM

## 2017-11-30 DIAGNOSIS — I5042 Chronic combined systolic (congestive) and diastolic (congestive) heart failure: Secondary | ICD-10-CM

## 2017-11-30 LAB — GLUCOSE, CAPILLARY
Glucose-Capillary: 135 mg/dL — ABNORMAL HIGH (ref 65–99)
Glucose-Capillary: 89 mg/dL (ref 65–99)
Glucose-Capillary: 103 mg/dL — ABNORMAL HIGH (ref 65–99)
Glucose-Capillary: 137 mg/dL — ABNORMAL HIGH (ref 65–99)

## 2017-11-30 LAB — BASIC METABOLIC PANEL
Anion gap: 7 (ref 5–15)
BUN: 74 mg/dL — ABNORMAL HIGH (ref 6–20)
CO2: 27 mmol/L (ref 22–32)
Calcium: 8.5 mg/dL — ABNORMAL LOW (ref 8.9–10.3)
Chloride: 103 mmol/L (ref 101–111)
Creatinine, Ser: 1.43 mg/dL — ABNORMAL HIGH (ref 0.61–1.24)
GFR calc Af Amer: 57 mL/min — ABNORMAL LOW (ref >60)
GFR calc non Af Amer: 50 mL/min — ABNORMAL LOW (ref >60)
Glucose, Bld: 128 mg/dL — ABNORMAL HIGH (ref 65–99)
Potassium: 4.1 mmol/L (ref 3.5–5.1)
Sodium: 137 mmol/L (ref 135–145)

## 2017-11-30 LAB — CBC
HCT: 37 % — ABNORMAL LOW (ref 40.0–52.0)
Hemoglobin: 12.7 g/dL — ABNORMAL LOW (ref 13.0–18.0)
MCH: 32.3 pg (ref 26.0–34.0)
MCHC: 34.3 g/dL (ref 32.0–36.0)
MCV: 94.2 fL (ref 80.0–100.0)
Platelets: 178 10*3/uL (ref 150–440)
RBC: 3.92 MIL/uL — ABNORMAL LOW (ref 4.40–5.90)
RDW: 15.8 % — ABNORMAL HIGH (ref 11.5–14.5)
WBC: 9.1 10*3/uL (ref 3.8–10.6)

## 2017-11-30 MED ORDER — METHYLPREDNISOLONE SODIUM SUCC 125 MG IJ SOLR
60.0000 mg | Freq: Two times a day (BID) | INTRAMUSCULAR | Status: DC
  Administered 2017-11-30 – 2017-12-01 (×2): 60 mg via INTRAVENOUS
  Filled 2017-11-30 (×2): qty 2

## 2017-11-30 MED ORDER — GUAIFENESIN ER 600 MG PO TB12
600.0000 mg | ORAL_TABLET | Freq: Two times a day (BID) | ORAL | Status: DC
  Administered 2017-11-30 – 2017-12-01 (×3): 600 mg via ORAL
  Filled 2017-11-30 (×3): qty 1

## 2017-11-30 NOTE — Care Management Important Message (Signed)
Important Message  Patient Details  Name: Jorge Cruz MRN: [id number] Date of Birth: [birth year]   Medicare Important Message Given:  Yes    Olegario Messier A Tawnya Pujol 11/30/2017, 11:03 AM

## 2017-11-30 NOTE — Progress Notes (Signed)
Progress Note  Patient Name: Jorge Cruz Date of Encounter: 11/30/2017  Primary Cardiologist: Yvonne Kendall, MD   Subjective   Eating well this morning, no complaints Denies any chest pain Feels his breathing is at 85% of baseline Sputum culture MSSA On antibiotics vancomycin ceftezolin  goes to [facility] health care clinic gets all of his medications through there including [facility]  Inpatient Medications    Scheduled Meds: . carvedilol  3.125 mg Oral BID WC  . docusate  100 mg Per Tube BID  . enoxaparin (LOVENOX) injection  40 mg Subcutaneous Q24H  . guaiFENesin  600 mg Oral BID  . insulin aspart  0-15 Units Subcutaneous TID WC  . insulin aspart  0-5 Units Subcutaneous QHS  . insulin glargine  30 Units Subcutaneous Q24H  . nitroGLYCERIN  0.3 mg Transdermal Daily  . protein supplement shake  11 oz Oral BID BM  . sacubitril-valsartan  1 tablet Oral BID  . senna-docusate  2 tablet Per Tube BID   Continuous Infusions: .  ceFAZolin (ANCEF) IV Stopped (11/30/17 0714)   PRN Meds: acetaminophen **OR** [DISCONTINUED] acetaminophen, guaiFENesin-dextromethorphan, ipratropium-albuterol, [DISCONTINUED] ondansetron **OR** ondansetron (ZOFRAN) IV   Vital Signs    Vitals:   11/29/17 2013 11/30/17 0427 11/30/17 0826 11/30/17 1024  BP: (!) 144/76 139/81 126/74   Pulse: (!) 103 88 92   Resp: 18 18    Temp: (!) 100.4 F (38 C) 98.4 F (36.9 C) 98.7 F (37.1 C)   TempSrc: Oral Oral Oral   SpO2: 92% 98% 97% (!) 89%  Weight:  208 lb 14.4 oz (94.8 kg)    Height:        Intake/Output Summary (Last 24 hours) at 11/30/2017 1325 Last data filed at 11/30/2017 1046 Gross per 24 hour  Intake 440 ml  Output 100 ml  Net 340 ml   Filed Weights   11/24/17 0201 11/24/17 0431 11/30/17 0427  Weight: 225 lb (102.1 kg) 222 lb 10.6 oz (101 kg) 208 lb 14.4 oz (94.8 kg)    Telemetry    Normal sinus rhythm- Personally Reviewed  ECG      Physical Exam   Constitutional:  oriented  to person, place, and time. No distress.  HENT:  Head: Normocephalic and atraumatic.  Eyes:  no discharge. No scleral icterus.  Neck: Normal range of motion. Neck supple. No JVD present.  Cardiovascular: Normal rate, regular rhythm, normal heart sounds and intact distal pulses. Exam reveals no gallop and no friction rub. No edema No murmur heard. Pulmonary/Chest: Rales at the right base otherwise clear. Moderately decreased breath sounds throughout  no stridor. No respiratory distress.  no wheezes.  no rales.  no tenderness.  Abdominal: Soft.  no distension.  no tenderness.  Musculoskeletal: Normal range of motion.  no  tenderness or deformity.  Neurological:  normal muscle tone. Coordination normal. No atrophy Skin: Skin is warm and dry. No rash noted. not diaphoretic.  Psychiatric:  normal mood and affect. behavior is normal. Thought content normal.    Labs    Chemistry Recent Labs  Lab 11/26/17 2045 11/28/17 0529 11/30/17 0414  NA 139 140 137  K 4.0 4.0 4.1  CL 106 109 103  CO2 26 26 27   GLUCOSE 153* 150* 128*  BUN 51* 61* 74*  CREATININE 1.43* 1.40* 1.43*  CALCIUM 8.7* 8.6* 8.5*  GFRNONAA 50* 51* 50*  GFRAA 57* 59* 57*  ANIONGAP 7 5 7      Hematology Recent Labs  Lab 11/26/17 2045 11/28/17  0529 11/30/17 0414  WBC 8.4 7.5 9.1  RBC 3.94* 3.68* 3.92*  HGB 12.3* 11.6* 12.7*  HCT 37.4* 34.8* 37.0*  MCV 95.1 94.7 94.2  MCH 31.3 31.5 32.3  MCHC 32.9 33.3 34.3  RDW 16.5* 16.1* 15.8*  PLT 127* 136* 178    Cardiac Enzymes Recent Labs  Lab 11/24/17 0202 11/24/17 1049 11/24/17 1402 11/25/17 0438  TROPONINI 0.08* 0.30* 0.13* 0.31*   No results for input(s): TROPIPOC in the last 168 hours.   BNP Recent Labs  Lab 11/24/17 0202 11/29/17 0959  BNP 519.0* 539.0*     DDimer No results for input(s): DDIMER in the last 168 hours.   Radiology    No results found.  Cardiac Studies   Echo Left ventricle: The cavity size was mildly dilated. There was    mild concentric hypertrophy. Systolic function was severely   reduced. The estimated ejection fraction was less than 20%.   Diffuse hypokinesis. Regional wall motion abnormalities cannot be   excluded. Features are consistent with a pseudonormal left   ventricular filling pattern, with concomitant abnormal relaxation   and increased filling pressure (grade 2 diastolic dysfunction). - Left atrium: The atrium was normal in size. - Right ventricle: Systolic function was mildly to moderately   reduced. - Pulmonary arteries: Systolic pressure was mildly elevated PA peak   pressure: 35 mm Hg (S).  Patient Profile     67 y.o. male NICM, combined syst/diast CHF, CKD III, HTN, DM, COPD, tob abuse, polysubstance abuse, noncompliance, and non-obstructive CAD, admitted with acute respiratory failure with hypoxia.  Assessment & Plan    1) acute respiratory distress Extubated 2 days ago COPD exacerbation in the setting of MSSA sputum on aggressive antibiotic regimen pneumonia Smoking cessation recommended  2) chronic systolic and diastolic heart failure Does not look grossly fluid overloaded, suspect admission may be more pulmonary etiology --Would cut back on Lasix, restart outpatient regimen given elevated BUN Restart all outpatient medications  ----Follow-up with [facility] cardiology  3) Chronic kidney disease stage III restart outpatient medications    Total encounter time more than 25 minutes  Greater than 50% was spent in counseling and coordination of care with the patient   For questions or updates, please contact [facility] Please consult [email or web address] for contact info under Cardiology/STEMI.      Signed, Julien Nordmann, MD  11/30/2017, 1:25 PM

## 2017-11-30 NOTE — Progress Notes (Signed)
[employer] - [place] [facility]                                                                                                                                                                                  Patient Demographics   Jorge Cruz, is a 67 y.o. male, DOB - January 29, 1951, ZOX:[id number]  Admit date - 11/24/2017   Admitting Physician Arnaldo Natal, MD  Outpatient Primary MD for the patient is [facility], [facility]   LOS - 6  Subjective: Feeling very weak, breathing better   Review of Systems:      CONSTITUTIONAL: No documented fever. + fatigue, weakness. No weight gain, no weight loss.  EYES: No blurry or double vision.  ENT: No tinnitus. No postnasal drip. No redness of the oropharynx.  RESPIRATORY: Positive cough, no wheeze, no hemoptysis. + dyspnea.  CARDIOVASCULAR: No chest pain. No orthopnea. No palpitations. No syncope.  GASTROINTESTINAL: No nausea, no vomiting or diarrhea. No abdominal pain. No melena or hematochezia.  GENITOURINARY:  No urgency. No frequency. No dysuria. No hematuria. No obstructive symptoms. No discharge. No pain. No significant abnormal bleeding ENDOCRINE: No polyuria or nocturia. No heat or cold intolerance.  HEMATOLOGY: No anemia. No bruising. No bleeding. No purpura. No petechiae INTEGUMENTARY: No rashes. No lesions.  MUSCULOSKELETAL: No arthritis. No swelling. No gout.  NEUROLOGIC: No numbness, tingling, or ataxia. No seizure-type activity.  PSYCHIATRIC: No anxiety. No insomnia. No ADD.    Vitals:   Vitals:   11/29/17 2013 11/30/17 0427 11/30/17 0826 11/30/17 1024  BP: (!) 144/76 139/81 126/74   Pulse: (!) 103 88 92   Resp: 18 18    Temp: (!) 100.4 F (38 C) 98.4 F (36.9 C) 98.7 F (37.1 C)   TempSrc: Oral Oral Oral   SpO2: 92% 98% 97% (!) 89%  Weight:  94.8 kg (208 lb 14.4 oz)    Height:        Wt Readings from Last 3 Encounters:  11/30/17 94.8 kg (208 lb 14.4 oz)  09/13/17 101.8 kg (224 lb 6  oz)  07/26/17 100.8 kg (222 lb 4 oz)     Intake/Output Summary (Last 24 hours) at 11/30/2017 1234 Last data filed at 11/30/2017 1046 Gross per 24 hour  Intake 440 ml  Output 100 ml  Net 340 ml    Physical Exam:   GENERAL: No acute distress HEAD, EYES, EARS, NOSE AND THROAT: Atraumatic, normocephalic. Extraocular muscles are intact. Pupils equal and reactive to light. Sclerae anicteric. No conjunctival injection. No oro-pharyngeal erythema.  NECK: Supple. There is no jugular venous distention. No bruits, no lymphadenopathy, no thyromegaly.  HEART: Regular rate and rhythm,. No  murmurs, no rubs, no clicks.  LUNGS: Bilateral rhonchus breath sounds ABDOMEN: Soft, flat, nontender, nondistended. Has good bowel sounds. No hepatosplenomegaly appreciated.  EXTREMITIES: No evidence of any cyanosis, clubbing, or peripheral edema.  +2 pedal and radial pulses bilaterally.  NEUROLOGIC: Awake following all commands SKIN: Moist and warm with no rashes appreciated.  Psych: Not anxious or depressed LN: No inguinal LN enlargement    Antibiotics   Anti-infectives (From admission, onward)   Start     Dose/Rate Route Frequency Ordered Stop   11/28/17 1400  ceFAZolin (ANCEF) IVPB 2g/100 mL premix     2 g 200 mL/hr over 30 Minutes Intravenous Every 8 hours 11/28/17 1214 11/30/17 2159   11/27/17 0400  vancomycin (VANCOCIN) 1,250 mg in sodium chloride 0.9 % 250 mL IVPB  Status:  Discontinued     1,250 mg 166.7 mL/hr over 90 Minutes Intravenous Every 18 hours 11/26/17 2034 11/28/17 1159   11/26/17 2100  ceFEPIme (MAXIPIME) 2 g in sodium chloride 0.9 % 100 mL IVPB  Status:  Discontinued     2 g 200 mL/hr over 30 Minutes Intravenous Every 12 hours 11/26/17 2030 11/27/17 1654   11/26/17 2100  vancomycin (VANCOCIN) IVPB 1000 mg/200 mL premix     1,000 mg 200 mL/hr over 60 Minutes Intravenous  Once 11/26/17 2030 11/26/17 2330      Medications   Scheduled Meds: . carvedilol  3.125 mg Oral BID WC  .  docusate  100 mg Per Tube BID  . enoxaparin (LOVENOX) injection  40 mg Subcutaneous Q24H  . guaiFENesin  600 mg Oral BID  . insulin aspart  0-15 Units Subcutaneous TID WC  . insulin aspart  0-5 Units Subcutaneous QHS  . insulin glargine  30 Units Subcutaneous Q24H  . nitroGLYCERIN  0.3 mg Transdermal Daily  . protein supplement shake  11 oz Oral BID BM  . sacubitril-valsartan  1 tablet Oral BID  . senna-docusate  2 tablet Per Tube BID   Continuous Infusions: .  ceFAZolin (ANCEF) IV Stopped (11/30/17 0714)   PRN Meds:.acetaminophen **OR** [DISCONTINUED] acetaminophen, guaiFENesin-dextromethorphan, ipratropium-albuterol, [DISCONTINUED] ondansetron **OR** ondansetron (ZOFRAN) IV   Data Review:   Micro Results Recent Results (from the past 240 hour(s))  MRSA PCR Screening     Status: None   Collection Time: 11/24/17  4:31 AM  Result Value Ref Range Status   MRSA by PCR NEGATIVE NEGATIVE Final    Comment:        The GeneXpert MRSA Assay (FDA approved for NASAL specimens only), is one component of a comprehensive MRSA colonization surveillance program. It is not intended to diagnose MRSA infection nor to guide or monitor treatment for MRSA infections. Performed at [facility], [address] [place], [place] [address]   Culture, respiratory (NON-Expectorated)     Status: None   Collection Time: 11/26/17  9:29 AM  Result Value Ref Range Status   Specimen Description   Final    TRACHEAL ASPIRATE Performed at [facility], [address] [place], [place] [address]    Special Requests   Final    NONE Performed at [facility], [address] [place], [place] [address]    Gram Stain   Final    MODERATE WBC PRESENT,BOTH PMN AND MONONUCLEAR NO SQUAMOUS EPITHELIAL CELLS SEEN ABUNDANT GRAM POSITIVE COCCI IN CLUSTERS Performed at [facility], [address] [place], [place] [address]    Culture ABUNDANT STAPHYLOCOCCUS AUREUS  Final    Report Status  11/28/2017 FINAL  Final   Organism ID, Bacteria STAPHYLOCOCCUS AUREUS  Final      Susceptibility   Staphylococcus aureus - MIC*    CIPROFLOXACIN <=0.5 SENSITIVE Sensitive     ERYTHROMYCIN >=8 RESISTANT Resistant     GENTAMICIN <=0.5 SENSITIVE Sensitive     OXACILLIN 0.5 SENSITIVE Sensitive     TETRACYCLINE <=1 SENSITIVE Sensitive     VANCOMYCIN <=0.5 SENSITIVE Sensitive     TRIMETH/SULFA <=10 SENSITIVE Sensitive     CLINDAMYCIN <=0.25 SENSITIVE Sensitive     RIFAMPIN <=0.5 SENSITIVE Sensitive     Inducible Clindamycin NEGATIVE Sensitive     * ABUNDANT STAPHYLOCOCCUS AUREUS  CULTURE, BLOOD (ROUTINE X 2) w Reflex to ID Panel     Status: None (Preliminary result)   Collection Time: 11/26/17  2:48 PM  Result Value Ref Range Status   Specimen Description BLOOD RT HAND  Final   Special Requests   Final    BOTTLES DRAWN AEROBIC AND ANAEROBIC Blood Culture adequate volume   Culture   Final    NO GROWTH 4 DAYS Performed at [facility], [address] [place], [place] [address]    Report Status PENDING  Incomplete  CULTURE, BLOOD (ROUTINE X 2) w Reflex to ID Panel     Status: None (Preliminary result)   Collection Time: 11/26/17  2:48 PM  Result Value Ref Range Status   Specimen Description BLOOD LT HAND  Final   Special Requests   Final    BOTTLES DRAWN AEROBIC AND ANAEROBIC Blood Culture adequate volume   Culture   Final    NO GROWTH 4 DAYS Performed at [facility], [address] [place], [place] [address]    Report Status PENDING  Incomplete    Radiology Reports Dg Chest 1 View  Result Date: 11/27/2017 CLINICAL DATA:  67 y/o  M; pulmonary edema. EXAM: CHEST  1 VIEW COMPARISON:  11/26/2017 chest radiograph FINDINGS: Stable cardiomegaly given projection and technique. Stable endotracheal tube 3.8 cm above the carina. Diminished interstitial pulmonary edema. No focal consolidation. No pleural effusion or pneumothorax. Bones are unremarkable.  IMPRESSION: Diminished interstitial pulmonary edema. No new consolidation. Stable endotracheal tube. Electronically Signed   By: Mitzi Hansen M.D.   On: 11/27/2017 04:38   Dg Chest Port 1 View  Result Date: 11/28/2017 CLINICAL DATA:  67 year old male with respiratory failure. Subsequent encounter. EXAM: PORTABLE CHEST 1 VIEW COMPARISON:  11/27/2017 chest x-ray. FINDINGS: Endotracheal tube tip 2.8 cm above the carina. Nasogastric tube courses below the diaphragm. Tip is not included on the present exam. Mild cardiomegaly. Pulmonary vascular congestion greatest centrally and lower lobe region similar to prior exam. No pneumothorax detected. IMPRESSION: Similar appearance of mild pulmonary vascular congestion. Electronically Signed   By: Lacy Duverney M.D.   On: 11/28/2017 06:54   Dg Chest Port 1 View  Result Date: 11/26/2017 CLINICAL DATA:  Respiratory failure. Patient with hypercapnic respiratory failure. Presently on mechanical ventilationHx of CHF, smoker, COPD, diabetes, NICM EXAM: PORTABLE CHEST 1 VIEW COMPARISON:  11/25/2017 FINDINGS: New endotracheal tube tip projects 3.5 cm above the Carina. Nasal/orogastric tube is stable passing below the diaphragm into the stomach. Lungs demonstrate vascular congestion and interstitial thickening, increased when compared to the prior exam. Cardiac silhouette is mildly enlarged. No convincing pleural effusion.  No pneumothorax. IMPRESSION: 1. Well-positioned endotracheal tube, tip 3.5 cm above the Carina. 2. Worsened lung aeration consistent with increased interstitial pulmonary edema. Electronically Signed   By: Renard Hamper.D.  On: 11/26/2017 11:50   Dg Chest Port 1 View  Result Date: 11/25/2017 CLINICAL DATA:  Respiratory failure EXAM: PORTABLE CHEST 1 VIEW COMPARISON:  11/24/2017 FINDINGS: Cardiac shadow is enlarged but stable. The gastric catheter is noted extending towards the stomach. The endotracheal tube is again noted above the thoracic  inlet and certainly could be advanced several cm. The lungs are clear bilaterally. This has improved significantly in the interval from the prior exam. No bony abnormality is noted. IMPRESSION: Significant improvement in vascular congestion and pulmonary edema. No focal infiltrate is seen. Tubes and lines as described. Electronically Signed   By: Alcide Clever M.D.   On: 11/25/2017 06:59   Dg Chest Port 1 View  Result Date: 11/24/2017 CLINICAL DATA:  Shortness of breath, post intubation EXAM: PORTABLE CHEST 1 VIEW COMPARISON:  05/30/2017 FINDINGS: Endotracheal tube tip possibly visible at the thoracic inlet. Poorly visible distal esophageal tubing, appears to course below the diaphragm but the tip is not well seen. Cardiomegaly with vascular congestion and moderate diffuse interstitial opacity concerning for pulmonary edema. No pneumothorax. IMPRESSION: 1. Tip of the endotracheal tube appears to be positioned at or slightly above the thoracic inlet 2. Poor visibility of the distal portion of the esophageal tube, appears to course below the diaphragm but tip is not included 3. Cardiomegaly with vascular congestion and moderate diffuse pulmonary edema Electronically Signed   By: Jasmine Pang M.D.   On: 11/24/2017 02:48   Dg Abd Portable 1v  Result Date: 11/24/2017 CLINICAL DATA:  OG tube placement. EXAM: PORTABLE ABDOMEN - 1 VIEW COMPARISON:  None. FINDINGS: Interval placement of an OG tube with the tip likely in the gastric antrum. Nonobstructive bowel gas pattern. The lung bases are clear. IMPRESSION: OG tube with the tip likely in the gastric antrum. Electronically Signed   By: Obie Dredge M.D.   On: 11/24/2017 16:53     CBC Recent Labs  Lab 11/24/17 0202 11/26/17 0628 11/26/17 2045 11/28/17 0529 11/30/17 0414  WBC 7.8 7.5 8.4 7.5 9.1  HGB 13.5 12.5* 12.3* 11.6* 12.7*  HCT 41.3 38.3* 37.4* 34.8* 37.0*  PLT 226 148* 127* 136* 178  MCV 97.2 95.1 95.1 94.7 94.2  MCH 31.7 31.2 31.3 31.5 32.3   MCHC 32.6 32.8 32.9 33.3 34.3  RDW 16.7* 16.2* 16.5* 16.1* 15.8*  LYMPHSABS 4.3*  --   --   --   --   MONOABS 0.6  --   --   --   --   EOSABS 0.1  --   --   --   --   BASOSABS 0.1  --   --   --   --     Chemistries  Recent Labs  Lab 11/25/17 0438 11/26/17 0522 11/26/17 2045 11/28/17 0529 11/30/17 0414  NA 140 140 139 140 137  K 4.9 5.2* 4.0 4.0 4.1  CL 109 111 106 109 103  CO2 22 23 26 26 27   GLUCOSE 133* 104* 153* 150* 128*  BUN 59* 62* 51* 61* 74*  CREATININE 1.71* 1.69* 1.43* 1.40* 1.43*  CALCIUM 8.7* 9.0 8.7* 8.6* 8.5*  MG 2.0  --  2.2  --   --    ------------------------------------------------------------------------------------------------------------------ estimated creatinine clearance is 58.7 mL/min (A) (by C-G formula based on SCr of 1.43 mg/dL (H)). ------------------------------------------------------------------------------------------------------------------ No results for input(s): HGBA1C in the last 72 hours. ------------------------------------------------------------------------------------------------------------------ No results for input(s): CHOL, HDL, LDLCALC, TRIG, CHOLHDL, LDLDIRECT in the last 72 hours. ------------------------------------------------------------------------------------------------------------------ No results for input(s): TSH,  T4TOTAL, T3FREE, THYROIDAB in the last 72 hours.  Invalid input(s): FREET3 ------------------------------------------------------------------------------------------------------------------ No results for input(s): VITAMINB12, FOLATE, FERRITIN, TIBC, IRON, RETICCTPCT in the last 72 hours.  Coagulation profile No results for input(s): INR, PROTIME in the last 168 hours.  No results for input(s): DDIMER in the last 72 hours.  Cardiac Enzymes Recent Labs  Lab 11/24/17 1049 11/24/17 1402 11/25/17 0438  TROPONINI 0.30* 0.13* 0.31*    ------------------------------------------------------------------------------------------------------------------ Invalid input(s): POCBNP    Assessment & Plan   This is a 67 year old male admitted for acute respiratory failure with hypoxia. 1.  Acute Respiratory failure: Acute; with hypoxia due to pulmonary edema secondary to acute on chronic systolic heart failure. cardiology following, continue Entresto try to wean oxygen  2.    Pneumonia continue Ancef  3.  COPD: Continue nebulizer therapy 4.  Diabetes mellitus type 2: Continue basal insulin therapy as well as sliding scale insulin while hospitalized.   5.  DVT prophylaxis: Lovenox 6.  GI prophylaxis: Initiate PPI 24 hours after intubation The patient is a full code.    Possible discharge tommorow     Code Status Orders  (From admission, onward)        Start     Ordered   11/24/17 0334  Full code  Continuous     11/24/17 0333    Code Status History    Date Active Date Inactive Code Status Order ID Comments User Context   05/31/2017 0252 06/01/2017 2203 Full Code [id number]  Arnaldo Natal, MD Inpatient   03/31/2016 2359 04/01/2016 1247 Full Code [id number]  Oralia Manis, MD Inpatient           Consults intensivist  DVT Prophylaxis  Lovenox   Lab Results  Component Value Date   PLT 178 11/30/2017     Time Spent in minutes 35 minutes  Auburn Bilberry M.D on 11/30/2017 at 12:34 PM  Between 7am to 6pm - Pager - [phone]  After 6pm go to [email or web address] - Social research officer, government  [employer]   Office  [phone]

## 2017-11-30 NOTE — Evaluation (Signed)
Physical Therapy Evaluation Patient Details Name: Jorge Cruz MRN: [id number] DOB: July 08, 1951 Today's Date: 11/30/2017   History of Present Illness  presented to ER secondary to worsening SOB; admitted with acute respiratory failure secondary to pulmonary edema related to new-onset of CHF.  Intubated 4/5-4/9; weaned to 2L with clearance to wean to RA as appropriate.  Clinical Impression  Upon evaluation, patient alert and oriented; follows all commands and demonstrates good safety awareness/insight.  Bilat UE/LE strength and ROM mildly deconditioned due to acute illness, but functional for basic transfers and mobility.  Able to complete bed mobility with mod indep; sit/stand, basic transfers and gait (200') with RW, cga/min assist.  Slow and deliberate, but no overt buckling or LOB noted.  Very minimal SOB with exertion, sats 89%on RA; improve to 91% within 30 second seated rest.  Do recommend continued use of RW due to higher-level balance deficits and for energy conservation with activity. Patient pleased with performance and eager to progress as able. Would benefit from skilled PT to address above deficits and promote optimal return to PLOF; Recommend transition to HHPT upon discharge from acute hospitalization.     Follow Up Recommendations Home health PT    Equipment Recommendations  Rolling walker with 5" wheels    Recommendations for Other Services       Precautions / Restrictions Precautions Precautions: Fall Restrictions Weight Bearing Restrictions: No      Mobility  Bed Mobility Overal bed mobility: Modified Independent Bed Mobility: Supine to Sit              Transfers Overall transfer level: Needs assistance Equipment used: Rolling walker (2 wheeled) Transfers: Sit to/from Stand Sit to Stand: Min guard         General transfer comment: cuing for hand placement, multiple attempts to complete  Ambulation/Gait Ambulation/Gait assistance: Min  guard Ambulation Distance (Feet): 200 Feet Assistive device: Rolling walker (2 wheeled)       General Gait Details: reciprocal stepping pattern with slow, deliberate, performance. Mild lateral sway with gait efforts, but no significant buckling or LOB noted (stability improved throughout distance).  Very minimal SOB with exertion, sats 89%on RA; improve to 91% within 30 second seated rest.  Stairs            Wheelchair Mobility    Modified Rankin (Stroke Patients Only)       Balance Overall balance assessment: Needs assistance Sitting-balance support: No upper extremity supported;Feet supported Sitting balance-Leahy Scale: Good     Standing balance support: Bilateral upper extremity supported Standing balance-Leahy Scale: Fair                               Pertinent Vitals/Pain Pain Assessment: No/denies pain    Home Living Family/patient expects to be discharged to:: Private residence Living Arrangements: Spouse/significant other Available Help at Discharge: Family Type of Home: House Home Access: Stairs to enter Entrance Stairs-Rails: None Entrance Stairs-Number of Steps: 1 Home Layout: One level Home Equipment: None      Prior Function Level of Independence: Independent         Comments: Indep with ADLs, household and community mobilization without assist device; working full-time in maintenance at [employer]   Dominant Hand: Right    Extremity/Trunk Assessment   Upper Extremity Assessment Upper Extremity Assessment: Overall WFL for tasks assessed    Lower Extremity Assessment Lower Extremity Assessment: Overall WFL for tasks assessed(grossly  4/5 throughout)       Communication   Communication: No difficulties  Cognition Arousal/Alertness: Awake/alert Behavior During Therapy: WFL for tasks assessed/performed Overall Cognitive Status: Within Functional Limits for tasks assessed                                         General Comments      Exercises Other Exercises Other Exercises: Toilet transfer, ambulatory with RW, cga/min assist; cuing for safety with surface changes and obstacles. Other Exercises: Set up for ADL while seated in recliner (alarm in place), needs in reach; CNA informed/aware and to assist patient as needed.   Assessment/Plan    PT Assessment Patient needs continued PT services  PT Problem List Decreased strength;Decreased range of motion;Decreased cognition;Decreased activity tolerance;Decreased balance;Decreased knowledge of use of DME;Decreased coordination;Decreased mobility;Decreased safety awareness;Cardiopulmonary status limiting activity;Decreased knowledge of precautions       PT Treatment Interventions DME instruction;Gait training;Stair training;Functional mobility training;Balance training;Therapeutic exercise;Therapeutic activities;Patient/family education    PT Goals (Current goals can be found in the Care Plan section)  Acute Rehab PT Goals Patient Stated Goal: to return home tomorrow PT Goal Formulation: With patient Time For Goal Achievement: 12/14/17 Potential to Achieve Goals: Good    Frequency Min 2X/week   Barriers to discharge Decreased caregiver support      Co-evaluation               AM-PAC PT "6 Clicks" Daily Activity  Outcome Measure Difficulty turning over in bed (including adjusting bedclothes, sheets and blankets)?: None Difficulty moving from lying on back to sitting on the side of the bed? : None Difficulty sitting down on and standing up from a chair with arms (e.g., wheelchair, bedside commode, etc,.)?: None Help needed moving to and from a bed to chair (including a wheelchair)?: A Little Help needed walking in hospital room?: A Little Help needed climbing 3-5 steps with a railing? : A Little 6 Click Score: 21    End of Session Equipment Utilized During Treatment: Gait belt;Oxygen Activity Tolerance: Patient  tolerated treatment well Patient left: in chair;with call bell/phone within reach;with chair alarm set Nurse Communication: Mobility status PT Visit Diagnosis: Muscle weakness (generalized) (M62.81);Difficulty in walking, not elsewhere classified (R26.2)    Time: 6962-9528 PT Time Calculation (min) (ACUTE ONLY): 33 min   Charges:   PT Evaluation $PT Eval Moderate Complexity: 1 Mod PT Treatments $Gait Training: 8-22 mins $Therapeutic Activity: 8-22 mins   PT G Codes:        Aasia Peavler H. Manson Passey, PT, DPT, NCS 11/30/17, 10:33 AM [phone]

## 2017-12-01 ENCOUNTER — Telehealth: Payer: Self-pay | Admitting: Internal Medicine

## 2017-12-01 LAB — CBC
HCT: 41.1 % (ref 40.0–52.0)
Hemoglobin: 13.7 g/dL (ref 13.0–18.0)
MCH: 31.4 pg (ref 26.0–34.0)
MCHC: 33.4 g/dL (ref 32.0–36.0)
MCV: 94.1 fL (ref 80.0–100.0)
Platelets: 211 10*3/uL (ref 150–440)
RBC: 4.37 MIL/uL — ABNORMAL LOW (ref 4.40–5.90)
RDW: 15.8 % — ABNORMAL HIGH (ref 11.5–14.5)
WBC: 7.2 10*3/uL (ref 3.8–10.6)

## 2017-12-01 LAB — BASIC METABOLIC PANEL
Anion gap: 9 (ref 5–15)
BUN: 72 mg/dL — ABNORMAL HIGH (ref 6–20)
CO2: 25 mmol/L (ref 22–32)
Calcium: 8.5 mg/dL — ABNORMAL LOW (ref 8.9–10.3)
Chloride: 98 mmol/L — ABNORMAL LOW (ref 101–111)
Creatinine, Ser: 1.28 mg/dL — ABNORMAL HIGH (ref 0.61–1.24)
GFR calc Af Amer: >60 mL/min (ref >60)
GFR calc non Af Amer: 57 mL/min — ABNORMAL LOW (ref >60)
Glucose, Bld: 266 mg/dL — ABNORMAL HIGH (ref 65–99)
Potassium: 4.2 mmol/L (ref 3.5–5.1)
Sodium: 132 mmol/L — ABNORMAL LOW (ref 135–145)

## 2017-12-01 LAB — CULTURE, BLOOD (ROUTINE X 2)
Culture: NO GROWTH
Culture: NO GROWTH
Special Requests: ADEQUATE
Special Requests: ADEQUATE

## 2017-12-01 LAB — GLUCOSE, CAPILLARY: Glucose-Capillary: 255 mg/dL — ABNORMAL HIGH (ref 65–99)

## 2017-12-01 MED ORDER — GUAIFENESIN-DM 100-10 MG/5ML PO SYRP: 5.0000 mL | ORAL_SOLUTION | ORAL | 0 refills | Status: DC | PRN

## 2017-12-01 MED ORDER — GUAIFENESIN ER 600 MG PO TB12: 600.0000 mg | ORAL_TABLET | Freq: Two times a day (BID) | ORAL | 0 refills | Status: AC

## 2017-12-01 MED ORDER — CEFUROXIME AXETIL 500 MG PO TABS: 500.0000 mg | ORAL_TABLET | Freq: Two times a day (BID) | ORAL | 0 refills | Status: DC

## 2017-12-01 MED ORDER — GUAIFENESIN ER 600 MG PO TB12: 600.0000 mg | ORAL_TABLET | Freq: Two times a day (BID) | ORAL | 0 refills | Status: DC

## 2017-12-01 MED ORDER — IPRATROPIUM-ALBUTEROL 0.5-2.5 (3) MG/3ML IN SOLN: 3.0000 mL | RESPIRATORY_TRACT | 2 refills | Status: DC | PRN

## 2017-12-01 MED ORDER — FLUTICASONE-SALMETEROL 250-50 MCG/DOSE IN AEPB: 1.0000 | INHALATION_SPRAY | Freq: Two times a day (BID) | RESPIRATORY_TRACT | 11 refills | Status: DC

## 2017-12-01 MED ORDER — INSULIN DETEMIR 100 UNIT/ML ~~LOC~~ SOLN: 30.0000 [IU] | Freq: Every day | SUBCUTANEOUS | 11 refills | Status: DC

## 2017-12-01 MED ORDER — CEFUROXIME AXETIL 500 MG PO TABS: 500.0000 mg | ORAL_TABLET | Freq: Two times a day (BID) | ORAL | 0 refills | Status: AC

## 2017-12-01 NOTE — Telephone Encounter (Signed)
TCM....  Patient is being discharged   They saw Dr End   They are scheduled to see Dr End  on 12/13/17   They were seen for CHF  They need to be seen within 1 week  Pt is on the  wait list   Please call

## 2017-12-01 NOTE — Telephone Encounter (Signed)
Currently admitted at this time. 

## 2017-12-01 NOTE — Discharge Summary (Signed)
[facility] - [place] at [facility], 67 y.o., DOB 08-Aug-1951, MRN [id number]. Admission date: 11/24/2017 Discharge Date 12/01/2017 [facility] Admitting Physician Arnaldo Natal, MD  Admission Diagnosis  SOB  Discharge Diagnosis   Active Problems:   Acute respiratory failure with hypoxia (HCC) MSSA pneumonia Acute on chronic systolic CHF Acute COPD exasperation Diabetes type II     Hospital Course  Patient 67 year old male admitted with acute respiratory failure with hypoxia.  He was thought to have a combination of acute CHF and COPD exasperation as well as pneumonia.  Patient required ventilation for a few days and subsequently extubated.  Now he is doing much better.  Cardiology felt that his presentation was more pulmonary related than CHF.  He does have significant systolic dysfunction.  He was started on Entresto.  Patient doing much better            Consults  cardiology, pulmonary critical care  Significant Tests:  See full reports for all details    Dg Chest 1 View  Result Date: 11/27/2017 CLINICAL DATA:  67 y/o  M; pulmonary edema. EXAM: CHEST  1 VIEW COMPARISON:  11/26/2017 chest radiograph FINDINGS: Stable cardiomegaly given projection and technique. Stable endotracheal tube 3.8 cm above the carina. Diminished interstitial pulmonary edema. No focal consolidation. No pleural effusion or pneumothorax. Bones are unremarkable. IMPRESSION: Diminished interstitial pulmonary edema. No new consolidation. Stable endotracheal tube. Electronically Signed   By: Mitzi Hansen M.D.   On: 11/27/2017 04:38   Dg Chest Port 1 View  Result Date: 11/28/2017 CLINICAL DATA:  67 year old male with respiratory failure. Subsequent encounter. EXAM: PORTABLE CHEST 1 VIEW COMPARISON:  11/27/2017 chest x-ray. FINDINGS: Endotracheal tube tip 2.8 cm above the carina. Nasogastric tube courses below the diaphragm. Tip is not  included on the present exam. Mild cardiomegaly. Pulmonary vascular congestion greatest centrally and lower lobe region similar to prior exam. No pneumothorax detected. IMPRESSION: Similar appearance of mild pulmonary vascular congestion. Electronically Signed   By: Lacy Duverney M.D.   On: 11/28/2017 06:54   Dg Chest Port 1 View  Result Date: 11/26/2017 CLINICAL DATA:  Respiratory failure. Patient with hypercapnic respiratory failure. Presently on mechanical ventilationHx of CHF, smoker, COPD, diabetes, NICM EXAM: PORTABLE CHEST 1 VIEW COMPARISON:  11/25/2017 FINDINGS: New endotracheal tube tip projects 3.5 cm above the Carina. Nasal/orogastric tube is stable passing below the diaphragm into the stomach. Lungs demonstrate vascular congestion and interstitial thickening, increased when compared to the prior exam. Cardiac silhouette is mildly enlarged. No convincing pleural effusion.  No pneumothorax. IMPRESSION: 1. Well-positioned endotracheal tube, tip 3.5 cm above the Carina. 2. Worsened lung aeration consistent with increased interstitial pulmonary edema. Electronically Signed   By: Amie Portland M.D.   On: 11/26/2017 11:50   Dg Chest Port 1 View  Result Date: 11/25/2017 CLINICAL DATA:  Respiratory failure EXAM: PORTABLE CHEST 1 VIEW COMPARISON:  11/24/2017 FINDINGS: Cardiac shadow is enlarged but stable. The gastric catheter is noted extending towards the stomach. The endotracheal tube is again noted above the thoracic inlet and certainly could be advanced several cm. The lungs are clear bilaterally. This has improved significantly in the interval from the prior exam. No bony abnormality is noted. IMPRESSION: Significant improvement in vascular congestion and pulmonary edema. No focal infiltrate is seen. Tubes and lines as described. Electronically Signed   By: Alcide Clever M.D.   On: 11/25/2017 06:59   Dg Chest [facility] 1 View  Result  Date: 11/24/2017 CLINICAL DATA:  Shortness of breath, post intubation  EXAM: PORTABLE CHEST 1 VIEW COMPARISON:  05/30/2017 FINDINGS: Endotracheal tube tip possibly visible at the thoracic inlet. Poorly visible distal esophageal tubing, appears to course below the diaphragm but the tip is not well seen. Cardiomegaly with vascular congestion and moderate diffuse interstitial opacity concerning for pulmonary edema. No pneumothorax. IMPRESSION: 1. Tip of the endotracheal tube appears to be positioned at or slightly above the thoracic inlet 2. Poor visibility of the distal portion of the esophageal tube, appears to course below the diaphragm but tip is not included 3. Cardiomegaly with vascular congestion and moderate diffuse pulmonary edema Electronically Signed   By: Jasmine Pang M.D.   On: 11/24/2017 02:48   Dg Abd Portable 1v  Result Date: 11/24/2017 CLINICAL DATA:  OG tube placement. EXAM: PORTABLE ABDOMEN - 1 VIEW COMPARISON:  None. FINDINGS: Interval placement of an OG tube with the tip likely in the gastric antrum. Nonobstructive bowel gas pattern. The lung bases are clear. IMPRESSION: OG tube with the tip likely in the gastric antrum. Electronically Signed   By: Obie Dredge M.D.   On: 11/24/2017 16:53       Today   Subjective:   Jorge Cruz feels much better denies any complaints Objective:   Blood pressure 135/69, pulse 79, temperature 97.7 F (36.5 C), temperature source Oral, resp. rate 18, height 5\' 10"  (1.778 m), weight 98.5 kg (217 lb 1.6 oz), SpO2 97 %.  .  Intake/Output Summary (Last 24 hours) at 12/01/2017 1424 Last data filed at 12/01/2017 0944 Gross per 24 hour  Intake 480 ml  Output -  Net 480 ml    Exam VITAL SIGNS: Blood pressure 135/69, pulse 79, temperature 97.7 F (36.5 C), temperature source Oral, resp. rate 18, height 5\' 10"  (1.778 m), weight 98.5 kg (217 lb 1.6 oz), SpO2 97 %.  GENERAL:  67 y.o.-year-old patient lying in the bed with no acute distress.  EYES: Pupils equal, round, reactive to light and accommodation. No scleral  icterus. Extraocular muscles intact.  HEENT: Head atraumatic, normocephalic. Oropharynx and nasopharynx clear.  NECK:  Supple, no jugular venous distention. No thyroid enlargement, no tenderness.  LUNGS: Normal breath sounds bilaterally, no wheezing, rales,rhonchi or crepitation. No use of accessory muscles of respiration.  CARDIOVASCULAR: S1, S2 normal. No murmurs, rubs, or gallops.  ABDOMEN: Soft, nontender, nondistended. Bowel sounds present. No organomegaly or mass.  EXTREMITIES: No pedal edema, cyanosis, or clubbing.  NEUROLOGIC: Cranial nerves II through XII are intact. Muscle strength 5/5 in all extremities. Sensation intact. Gait not checked.  PSYCHIATRIC: The patient is alert and oriented x 3.  SKIN: No obvious rash, lesion, or ulcer.   Data Review     CBC w Diff:  Lab Results  Component Value Date   WBC 7.2 12/01/2017   HGB 13.7 12/01/2017   HCT 41.1 12/01/2017   PLT 211 12/01/2017   LYMPHOPCT 56 11/24/2017   MONOPCT 8 11/24/2017   EOSPCT 1 11/24/2017   BASOPCT 1 11/24/2017   CMP:  Lab Results  Component Value Date   NA 132 (L) 12/01/2017   NA 141 05/03/2016   K 4.2 12/01/2017   CL 98 (L) 12/01/2017   CO2 25 12/01/2017   BUN 72 (H) 12/01/2017   BUN 21 05/03/2016   CREATININE 1.28 (H) 12/01/2017  .  Micro Results Recent Results (from the past 240 hour(s))  MRSA PCR Screening     Status: None   Collection Time:  11/24/17  4:31 AM  Result Value Ref Range Status   MRSA by PCR NEGATIVE NEGATIVE Final    Comment:        The GeneXpert MRSA Assay (FDA approved for NASAL specimens only), is one component of a comprehensive MRSA colonization surveillance program. It is not intended to diagnose MRSA infection nor to guide or monitor treatment for MRSA infections. Performed at [facility], [address] [place], [place] [address]   Culture, respiratory (NON-Expectorated)     Status: None   Collection Time: 11/26/17  9:29 AM  Result Value Ref Range  Status   Specimen Description   Final    TRACHEAL ASPIRATE Performed at [facility], [address] [place], [place] [address]    Special Requests   Final    NONE Performed at [facility], [address] [place], [place] [address]    Gram Stain   Final    MODERATE WBC PRESENT,BOTH PMN AND MONONUCLEAR NO SQUAMOUS EPITHELIAL CELLS SEEN ABUNDANT GRAM POSITIVE COCCI IN CLUSTERS Performed at [facility], [address], [place], [place] [address]    Culture ABUNDANT STAPHYLOCOCCUS AUREUS  Final   Report Status 11/28/2017 FINAL  Final   Organism ID, Bacteria STAPHYLOCOCCUS AUREUS  Final      Susceptibility   Staphylococcus aureus - MIC*    CIPROFLOXACIN <=0.5 SENSITIVE Sensitive     ERYTHROMYCIN >=8 RESISTANT Resistant     GENTAMICIN <=0.5 SENSITIVE Sensitive     OXACILLIN 0.5 SENSITIVE Sensitive     TETRACYCLINE <=1 SENSITIVE Sensitive     VANCOMYCIN <=0.5 SENSITIVE Sensitive     TRIMETH/SULFA <=10 SENSITIVE Sensitive     CLINDAMYCIN <=0.25 SENSITIVE Sensitive     RIFAMPIN <=0.5 SENSITIVE Sensitive     Inducible Clindamycin NEGATIVE Sensitive     * ABUNDANT STAPHYLOCOCCUS AUREUS  CULTURE, BLOOD (ROUTINE X 2) w Reflex to ID Panel     Status: None   Collection Time: 11/26/17  2:48 PM  Result Value Ref Range Status   Specimen Description BLOOD RT HAND  Final   Special Requests   Final    BOTTLES DRAWN AEROBIC AND ANAEROBIC Blood Culture adequate volume   Culture   Final    NO GROWTH 5 DAYS Performed at [facility], [address] [place], [place] [address]    Report Status 12/01/2017 FINAL  Final  CULTURE, BLOOD (ROUTINE X 2) w Reflex to ID Panel     Status: None   Collection Time: 11/26/17  2:48 PM  Result Value Ref Range Status   Specimen Description BLOOD LT HAND  Final   Special Requests   Final    BOTTLES DRAWN AEROBIC AND ANAEROBIC Blood Culture adequate volume   Culture   Final    NO GROWTH 5 DAYS Performed at [facility], [address], [place], [place] [address]    Report Status 12/01/2017 FINAL  Final        Code Status Orders  (From admission, onward)        Start     Ordered   11/24/17 0334  Full code  Continuous     11/24/17 0333    Code Status History    Date Active Date Inactive Code Status Order ID Comments User Context   05/31/2017 0252 06/01/2017 2203 Full Code [id number]  Arnaldo Natal, MD Inpatient   03/31/2016 2359 04/01/2016 1247 Full Code [id number]  Oralia Manis, MD Inpatient  Follow-up Information    Toy Cookey, FNP. Go on 12/15/2017.   Specialty:  Family Medicine Why:  @11 :20 AM Contact information: [address] [place] [address] [phone]        Yvonne Kendall, MD. Go on 12/13/2017.   Specialty:  Cardiology Why:  @1 :40 PM Contact information: [address] [place] [address] [phone]           Discharge Medications   Allergies as of 12/01/2017   No Known Allergies     Medication List    STOP taking these medications   potassium chloride SA 20 MEQ tablet Commonly known as:  K-DUR,KLOR-CON     TAKE these medications   albuterol 108 (90 Base) MCG/ACT inhaler Commonly known as:  PROVENTIL HFA;VENTOLIN HFA Inhale 2 puffs into the lungs every 6 (six) hours as needed for wheezing or shortness of breath.   aspirin EC 81 MG tablet Take 1 tablet (81 mg total) by mouth daily.   atorvastatin 80 MG tablet Commonly known as:  LIPITOR Take 80 mg by mouth daily.   carvedilol 3.125 MG tablet Commonly known as:  COREG Take 3.125 mg by mouth 2 (two) times daily with a meal.   cefUROXime 500 MG tablet Commonly known as:  CEFTIN Take 1 tablet (500 mg total) by mouth 2 (two) times daily for 4 days.   Fluticasone-Salmeterol 250-50 MCG/DOSE Aepb Commonly known as:  ADVAIR DISKUS Inhale 1 puff into the lungs 2 (two) times daily.   furosemide 20 MG tablet Commonly known as:  LASIX Take 1  tablet (20 mg total) by mouth daily.   guaiFENesin 600 MG 12 hr tablet Commonly known as:  MUCINEX Take 1 tablet (600 mg total) by mouth 2 (two) times daily for 7 days.   guaiFENesin-dextromethorphan 100-10 MG/5ML syrup Commonly known as:  ROBITUSSIN DM Take 5 mLs by mouth every 4 (four) hours as needed for cough.   insulin detemir 100 UNIT/ML injection Commonly known as:  LEVEMIR Inject 0.3 mLs (30 Units total) into the skin at bedtime. What changed:  how much to take   ipratropium-albuterol 0.5-2.5 (3) MG/3ML Soln Commonly known as:  DUONEB Take 3 mLs by nebulization every 4 (four) hours as needed.   losartan 25 MG tablet Commonly known as:  COZAAR Take 25 mg by mouth daily.   metFORMIN 1000 MG tablet Commonly known as:  GLUCOPHAGE Take 1 tablet by mouth 2 (two) times daily.   nitroGLYCERIN 0.4 MG SL tablet Commonly known as:  NITROSTAT Place 0.4 mg under the tongue every 5 (five) minutes as needed for chest pain.   sacubitril-valsartan 24-26 MG Commonly known as:  ENTRESTO Take 1 tablet by mouth 2 (two) times daily.            Durable Medical Equipment  (From admission, onward)        Start     Ordered   12/01/17 0939  For home use only DME Walker rolling  [facility])  Once    Question:  Patient needs a walker to treat with the following condition  Answer:  Unsteady gait   12/01/17 0939   12/01/17 0937  For home use only DME Nebulizer machine  Once    Question:  Patient needs a nebulizer to treat with the following condition  Answer:  COPD (chronic obstructive pulmonary disease) (HCC)   12/01/17 0937         Total Time in preparing paper work, data evaluation and todays exam -  35 minutes  Auburn Bilberry M.D on 12/01/2017 at 2:24 PM [facility]   Office  [phone]

## 2017-12-01 NOTE — Care Management (Signed)
Discharge to home today per Dr. Allena Katz. Physical therapy evaluation completed, Recommending home with home health/physical therapy. Wife chose [facility]. Dominic Pea representative updated. Rolling walker and nebulizer per [facility]. Family will transport Gwenette Greet RN MSN CCM Care Management [phone]

## 2017-12-03 DIAGNOSIS — F17210 Nicotine dependence, cigarettes, uncomplicated: Secondary | ICD-10-CM | POA: Diagnosis not present

## 2017-12-03 DIAGNOSIS — J449 Chronic obstructive pulmonary disease, unspecified: Secondary | ICD-10-CM | POA: Diagnosis not present

## 2017-12-03 DIAGNOSIS — I110 Hypertensive heart disease with heart failure: Secondary | ICD-10-CM | POA: Diagnosis not present

## 2017-12-03 DIAGNOSIS — Z794 Long term (current) use of insulin: Secondary | ICD-10-CM | POA: Diagnosis not present

## 2017-12-03 DIAGNOSIS — E119 Type 2 diabetes mellitus without complications: Secondary | ICD-10-CM | POA: Diagnosis not present

## 2017-12-03 DIAGNOSIS — I5022 Chronic systolic (congestive) heart failure: Secondary | ICD-10-CM | POA: Diagnosis not present

## 2017-12-03 DIAGNOSIS — I429 Cardiomyopathy, unspecified: Secondary | ICD-10-CM | POA: Diagnosis not present

## 2017-12-04 NOTE — Telephone Encounter (Signed)
No answer. Left message to call back.   

## 2017-12-05 DIAGNOSIS — J449 Chronic obstructive pulmonary disease, unspecified: Secondary | ICD-10-CM | POA: Diagnosis not present

## 2017-12-05 DIAGNOSIS — I110 Hypertensive heart disease with heart failure: Secondary | ICD-10-CM | POA: Diagnosis not present

## 2017-12-05 DIAGNOSIS — F17210 Nicotine dependence, cigarettes, uncomplicated: Secondary | ICD-10-CM | POA: Diagnosis not present

## 2017-12-05 DIAGNOSIS — I5022 Chronic systolic (congestive) heart failure: Secondary | ICD-10-CM | POA: Diagnosis not present

## 2017-12-05 DIAGNOSIS — I429 Cardiomyopathy, unspecified: Secondary | ICD-10-CM | POA: Diagnosis not present

## 2017-12-05 DIAGNOSIS — E119 Type 2 diabetes mellitus without complications: Secondary | ICD-10-CM | POA: Diagnosis not present

## 2017-12-05 NOTE — Telephone Encounter (Signed)
Patient answered but in an area where unable to hear me well. He will call us back later.

## 2017-12-06 NOTE — Telephone Encounter (Signed)
3rd attempt to reach patient for TCM.

## 2017-12-08 DIAGNOSIS — I429 Cardiomyopathy, unspecified: Secondary | ICD-10-CM | POA: Diagnosis not present

## 2017-12-08 DIAGNOSIS — F17210 Nicotine dependence, cigarettes, uncomplicated: Secondary | ICD-10-CM | POA: Diagnosis not present

## 2017-12-08 DIAGNOSIS — E119 Type 2 diabetes mellitus without complications: Secondary | ICD-10-CM | POA: Diagnosis not present

## 2017-12-08 DIAGNOSIS — I110 Hypertensive heart disease with heart failure: Secondary | ICD-10-CM | POA: Diagnosis not present

## 2017-12-08 DIAGNOSIS — J449 Chronic obstructive pulmonary disease, unspecified: Secondary | ICD-10-CM | POA: Diagnosis not present

## 2017-12-08 DIAGNOSIS — I5022 Chronic systolic (congestive) heart failure: Secondary | ICD-10-CM | POA: Diagnosis not present

## 2017-12-09 NOTE — Progress Notes (Deleted)
Patient ID: Jorge Cruz, male    DOB: 08/20/1951, 67 y.o.   MRN: [id number]  HPI  Jorge Cruz is a 67 y/o male with a history of DM, COPD, HTN, current tobacco use and chronic heart failure.  Echo report from 11/25/17 reviewed and showed an EF of 20-25% along with a mildly elevated PA pressure of 35 mm Hg. Echo report from 04/01/16 was reviewed and shows an EF of 20-25% along with mild Jorge. Cardiac catheterization was done 06/01/17 which showed an EF of 20% along with mild nonobstructive CAD. RHC also done which showed normal filling pressures, normal pulmonary pressure and moderately reduced cardiac output. Cardiac output was 3.82 with a cardiac index of 1.83.  Admitted 11/24/17 due to HF/ COPD exacerbation. Initially needed intubation. Cardiology consult obtained. Discharged after 7 days.   He presents today for a follow-up visit with a chief complaint of    Past Medical History:  Diagnosis Date  . Chronic combined systolic (congestive) and diastolic (congestive) heart failure (HCC)    a. 03/2014 Echo: EF 45%, glob HK; b. 03/2016 Echo: EF 20-25%, diff HK, Gr1 DD, mild Jorge; b. 11/2017 Echo: EF 20%, diff HK, Gr2 DD, mildly to mod reduced RV fxn, PASP .  . CKD (chronic kidney disease), stage III (HCC)   . COPD (chronic obstructive pulmonary disease) (HCC)   . Diabetes mellitus without complication (HCC)   . Hypertensive heart disease   . NICM (nonischemic cardiomyopathy) (HCC)    a. 03/2014 Echo: EF 45%, glob HK, mild conc LVH; b. 03/2014 MV: possible mild ischemia, superimposed on small inf infarct, EF 36%; c. 03/2016 MV: EF <30%, no ischemia; d. 03/2016 Echo: EF 20-25%, diff HK, Gr1 DD, mild Jorge; e. 05/2017 Cath: nonobs dzs, EF 20%; f. 11/2017 Echo: EF 20%, diff HK, Gr2 DD.  . Non-obstructive CAD (coronary artery disease)    a. 05/2017 Cath: LM nl, LAD 44m, LCX 61m, RCA 47m, EF 20%.  . Polysubstance abuse (HCC)   . Tobacco abuse    Past Surgical History:  Procedure Laterality Date  . RIGHT/LEFT  HEART CATH AND CORONARY ANGIOGRAPHY N/A 06/01/2017   Procedure: RIGHT/LEFT HEART CATH AND CORONARY ANGIOGRAPHY;  Surgeon: Iran Ouch, MD;  Location: [facility] INVASIVE CV LAB;  Service: Cardiovascular;  Laterality: N/A;   Family History  Problem Relation Age of Onset  . Diabetes Mother   . Diabetes Father    Social History   Tobacco Use  . Smoking status: Current Every Day Smoker    Packs/day: 1.00    Years: 50.00    Pack years: 50.00  . Smokeless tobacco: Never Used  Substance Use Topics  . Alcohol use: No   No Known Allergies   Review of Systems  Constitutional: Negative for appetite change and fatigue.  HENT: Positive for rhinorrhea. Negative for congestion and sore throat.   Eyes: Negative.   Respiratory: Negative for cough, chest tightness and shortness of breath.   Cardiovascular: Negative for chest pain, palpitations and leg swelling.  Gastrointestinal: Negative for abdominal distention and abdominal pain.  Endocrine: Negative.   Genitourinary: Negative.   Musculoskeletal: Negative for back pain and neck pain.  Skin: Negative.   Allergic/Immunologic: Negative.   Neurological: Negative for dizziness and light-headedness.  Hematological: Negative for adenopathy. Does not bruise/bleed easily.  Psychiatric/Behavioral: Negative for dysphoric mood and sleep disturbance (sleeping on 2 pillows). The patient is not nervous/anxious.      Physical Exam  Constitutional: He is oriented to person, place, and  time. He appears well-developed and well-nourished.  HENT:  Head: Normocephalic and atraumatic.  Neck: Normal range of motion. Neck supple. No JVD present.  Cardiovascular: Normal rate and regular rhythm.  Pulmonary/Chest: Effort normal. He has no wheezes. He has no rales.  Abdominal: Soft. He exhibits no distension. There is no tenderness.  Musculoskeletal: He exhibits no edema or tenderness.  Neurological: He is alert and oriented to person, place, and time.  Skin:  Skin is warm and dry.  Psychiatric: He has a normal mood and affect. His behavior is normal. Thought content normal.  Nursing note and vitals reviewed.   Assessment & Plan:  1: Chronic heart failure with reduced ejection fraction- - NYHA class I - euvolemic today - weighing daily. Reminded  to call for an overnight weight gain of >2 pounds or a weekly weight gain of >5 pounds - weight stable since the last time he was here - not adding salt and has been reading food labels. Reviewed the importance of keeping sodium intake to 2000mg  daily.  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 Has to walk up 30 steps numerous times a night & can do so without difficulty - BNP 11/29/17 was 539.0  2: HTN- - BP looks good today - BMP from 12/01/17 reviewed and shows sodium 132, potassium 4.2 and GFR >60 - says that he saw his PCP at [facility] ~ 1 week ago  3: DM- - did not check his glucose yesterday or today - A1c on 05/31/17 was 7.6%, down from 9.9% back in August 2017  4:Tobacco use-  - smokes ~ 1 pack of cigarettes daily - complete cessation discussed for 3 minutes with the patient  Patient did not bring his medications nor a list. Each medication was verbally reviewed with the patient and he was encouraged to bring the bottles to every visit to confirm accuracy of list.

## 2017-12-11 DIAGNOSIS — I110 Hypertensive heart disease with heart failure: Secondary | ICD-10-CM | POA: Diagnosis not present

## 2017-12-11 DIAGNOSIS — J449 Chronic obstructive pulmonary disease, unspecified: Secondary | ICD-10-CM | POA: Diagnosis not present

## 2017-12-11 DIAGNOSIS — I429 Cardiomyopathy, unspecified: Secondary | ICD-10-CM | POA: Diagnosis not present

## 2017-12-11 DIAGNOSIS — E119 Type 2 diabetes mellitus without complications: Secondary | ICD-10-CM | POA: Diagnosis not present

## 2017-12-11 DIAGNOSIS — F17210 Nicotine dependence, cigarettes, uncomplicated: Secondary | ICD-10-CM | POA: Diagnosis not present

## 2017-12-11 DIAGNOSIS — I5022 Chronic systolic (congestive) heart failure: Secondary | ICD-10-CM | POA: Diagnosis not present

## 2017-12-13 ENCOUNTER — Ambulatory Visit (INDEPENDENT_AMBULATORY_CARE_PROVIDER_SITE_OTHER): Payer: Medicaid Other | Admitting: Internal Medicine

## 2017-12-13 ENCOUNTER — Encounter: Payer: Self-pay | Admitting: Internal Medicine

## 2017-12-13 VITALS — BP 110/58 | HR 77 | Ht 68.0 in | Wt 212.0 lb

## 2017-12-13 DIAGNOSIS — E1159 Type 2 diabetes mellitus with other circulatory complications: Secondary | ICD-10-CM

## 2017-12-13 DIAGNOSIS — I2510 Atherosclerotic heart disease of native coronary artery without angina pectoris: Secondary | ICD-10-CM | POA: Diagnosis not present

## 2017-12-13 DIAGNOSIS — Z794 Long term (current) use of insulin: Secondary | ICD-10-CM

## 2017-12-13 DIAGNOSIS — I5022 Chronic systolic (congestive) heart failure: Secondary | ICD-10-CM

## 2017-12-13 DIAGNOSIS — E782 Mixed hyperlipidemia: Secondary | ICD-10-CM | POA: Diagnosis not present

## 2017-12-13 MED ORDER — CARVEDILOL 3.125 MG PO TABS: 3.1250 mg | ORAL_TABLET | Freq: Two times a day (BID) | ORAL | 3 refills | Status: DC

## 2017-12-13 NOTE — Patient Instructions (Signed)
Medication Instructions:  Your physician has recommended you make the following change in your medication:  1- STOP Losartan. 2- START Carvedilol 3.125 mg by mouth two times a day.   Labwork: none  Testing/Procedures: none  Follow-Up: Your physician recommends that you schedule a follow-up appointment in: 5 WEEKS WITH CHRIS OR RYAN.  If you need a refill on your cardiac medications before your next appointment, please call your pharmacy.

## 2017-12-13 NOTE — Progress Notes (Signed)
Follow-up Outpatient Visit Date: 12/13/2017  Primary Care Provider: Inc, [facility] [address] [place] [address]  Chief Complaint: Follow-up systolic heart failure  HPI:  [name] is a 67 y.o. year-old male with history of chronic systolic and diastolic heart failure secondary to nonischemic cardiomyopathy, nonobstructive coronary artery disease, hypertension, diabetes mellitus, chronic kidney disease stage III, COPD, polysubstance abuse, and medication noncompliance who presents for follow-up of heart failure.  He was hospitalized earlier this month due to acute shortness of breath secondary to acute decompensated heart failure with pulmonary edema.  He was intubated and mechanically ventilated.  He is only been seen once in our office in 04/2016 (hospital follow-up).  Since then, he was evaluated by [facility] cardiology in 05/2017.  Today, Mr. Tabar reports feeling well.  His breathing is back to baseline.  He denies chest pain, shortness of breath, palpitations, lightheadedness, orthopnea, and edema.  He has been compliant with his medications and is weighing himself regularly at home.  His weight has been stable or slightly decreased since his recent hospitalization.  He is avoiding salt.  He would like to continue following up with [place] rather than returning to [facility].  He notes that carvedilol was recently stopped by his PCP, though he is unsure why.  Interestingly, he is currently on both Entresto and losartan.  --------------------------------------------------------------------------------------------------  Past Medical History:  Diagnosis Date  . Chronic combined systolic (congestive) and diastolic (congestive) heart failure (HCC)    a. 03/2014 Echo: EF 45%, glob HK; b. 03/2016 Echo: EF 20-25%, diff HK, Gr1 DD, mild MR; b. 11/2017 Echo: EF 20%, diff HK, Gr2 DD, mildly to mod reduced RV fxn, PASP .  . CKD (chronic kidney disease), stage III (HCC)   . COPD (chronic  obstructive pulmonary disease) (HCC)   . Diabetes mellitus without complication (HCC)   . Hypertensive heart disease   . NICM (nonischemic cardiomyopathy) (HCC)    a. 03/2014 Echo: EF 45%, glob HK, mild conc LVH; b. 03/2014 MV: possible mild ischemia, superimposed on small inf infarct, EF 36%; c. 03/2016 MV: EF <30%, no ischemia; d. 03/2016 Echo: EF 20-25%, diff HK, Gr1 DD, mild MR; e. 05/2017 Cath: nonobs dzs, EF 20%; f. 11/2017 Echo: EF 20%, diff HK, Gr2 DD.  . Non-obstructive CAD (coronary artery disease)    a. 05/2017 Cath: LM nl, LAD 74m, LCX 47m, RCA 40m, EF 20%.  . Polysubstance abuse (HCC)   . Tobacco abuse    Past Surgical History:  Procedure Laterality Date  . RIGHT/LEFT HEART CATH AND CORONARY ANGIOGRAPHY N/A 06/01/2017   Procedure: RIGHT/LEFT HEART CATH AND CORONARY ANGIOGRAPHY;  Surgeon: Iran Ouch, MD;  Location: [facility] INVASIVE CV LAB;  Service: Cardiovascular;  Laterality: N/A;    Past medical and surgical history were reviewed and updated in EPIC.  Current Meds  Medication Sig  . albuterol (PROVENTIL HFA;VENTOLIN HFA) 108 (90 Base) MCG/ACT inhaler Inhale 2 puffs into the lungs every 6 (six) hours as needed for wheezing or shortness of breath.  Marland Kitchen aspirin EC 81 MG tablet Take 1 tablet (81 mg total) by mouth daily.  Marland Kitchen atorvastatin (LIPITOR) 80 MG tablet Take 80 mg by mouth daily.  . Fluticasone-Salmeterol (ADVAIR DISKUS) 250-50 MCG/DOSE AEPB Inhale 1 puff into the lungs 2 (two) times daily.  . furosemide (LASIX) 20 MG tablet Take 1 tablet (20 mg total) by mouth daily.  Marland Kitchen guaiFENesin-dextromethorphan (ROBITUSSIN DM) 100-10 MG/5ML syrup Take 5 mLs by mouth every 4 (four) hours  as needed for cough.  . insulin detemir (LEVEMIR) 100 UNIT/ML injection Inject 0.3 mLs (30 Units total) into the skin at bedtime.  Marland Kitchen ipratropium-albuterol (DUONEB) 0.5-2.5 (3) MG/3ML SOLN Take 3 mLs by nebulization every 4 (four) hours as needed.  . metFORMIN (GLUCOPHAGE) 1000 MG tablet Take 1 tablet by  mouth 2 (two) times daily.  . nitroGLYCERIN (NITROSTAT) 0.4 MG SL tablet Place 0.4 mg under the tongue every 5 (five) minutes as needed for chest pain.  . sacubitril-valsartan (ENTRESTO) 24-26 MG Take 1 tablet by mouth 2 (two) times daily.  . [DISCONTINUED] losartan (COZAAR) 25 MG tablet Take 25 mg by mouth daily.    Allergies: Patient has no known allergies.  Social History   Tobacco Use  . Smoking status: Current Some Day Smoker    Packs/day: 0.25    Years: 50.00    Pack years: 12.50  . Smokeless tobacco: Never Used  Substance Use Topics  . Alcohol use: No  . Drug use: No    Family History  Problem Relation Age of Onset  . Diabetes Mother   . Diabetes Father     Review of Systems: A 12-system review of systems was performed and was negative except as noted in the HPI.  --------------------------------------------------------------------------------------------------  Physical Exam: BP (!) 110/58 (BP Location: Left Arm, Patient Position: Sitting, Cuff Size: Normal)   Pulse 77   Ht 5\' 8"  (1.727 m)   Wt 212 lb (96.2 kg)   BMI 32.23 kg/m   General: NAD. HEENT: No conjunctival pallor or scleral icterus. Moist mucous membranes.  OP clear. Neck: Supple without lymphadenopathy, thyromegaly, JVD, or HJR. Lungs: Normal work of breathing. Clear to auscultation bilaterally without wheezes or crackles. Heart: Regular rate and rhythm without murmurs, rubs, or gallops. Non-displaced PMI. Abd: Bowel sounds present. Soft, NT/ND without hepatosplenomegaly Ext: No lower extremity edema. Radial, PT, and DP pulses are 2+ bilaterally. Skin: Warm and dry without rash.  EKG: NSR with frequent PACs.  Poor R wave progression.  Lab Results  Component Value Date   WBC 7.2 12/01/2017   HGB 13.7 12/01/2017   HCT 41.1 12/01/2017   MCV 94.1 12/01/2017   PLT 211 12/01/2017    Lab Results  Component Value Date   NA 132 (L) 12/01/2017   K 4.2 12/01/2017   CL 98 (L) 12/01/2017   CO2 25  12/01/2017   BUN 72 (H) 12/01/2017   CREATININE 1.28 (H) 12/01/2017   GLUCOSE 266 (H) 12/01/2017    Lab Results  Component Value Date   CHOL 151 04/01/2016   HDL 46 04/01/2016   LDLCALC 99 04/01/2016   TRIG 363 (H) 11/27/2017   CHOLHDL 3.3 04/01/2016    --------------------------------------------------------------------------------------------------  ASSESSMENT AND PLAN: Chronic systolic heart failure secondary to nonischemic cardiomyopathy Mr. Sperbeck appears euvolemic and well compensated on exam today with NYHA class II heart failure symptoms.  His weight is down about 5 pounds since his recent hospitalization.  We have agreed to restart carvedilol 3.125 mg twice daily.  I will discontinue losartan, as there is no indication for him to be on losartan and Entresto at the same time.  Carvedilol and Entresto should be uptitrated, as tolerated to maximal doses.  Addition of spironolactone will also need to be considered if Mr. Hoard is able to remain compliant with follow-up.  I encouraged him to maintain sodium restriction and to continue his current dose of furosemide 20 mg daily.  Nonobstructive coronary artery disease Mild disease of up to 30%  noted during catheterization in 05/2017.  Continue medical therapy to prevent progression.  No angina reported by Mr. Picha.  Hyperlipidemia Given multivessel CAD, albeit only mild to moderate, we will continue with high intensity statin therapy and low-dose aspirin to prevent progression of disease.  Type 2 diabetes mellitus Currently, Mr. Nine is on metformin and insulin detemir.  Given chronic systolic heart failure, addition of empagliflozin should be considered due to its beneficial cardiac effects.  I will defer this medication change to Mr. Gladue's PCP.  Follow-up: Return to clinic in 6 weeks.  Yvonne Kendall, MD 12/14/2017 8:54 PM

## 2017-12-14 ENCOUNTER — Encounter: Payer: Self-pay | Admitting: Internal Medicine

## 2017-12-14 ENCOUNTER — Ambulatory Visit: Payer: Medicaid Other | Admitting: Family

## 2017-12-14 NOTE — Addendum Note (Signed)
Addended by: Jatinder Mcdonagh A on: 12/14/2017 09:14 PM   Modules accepted: Level of Service

## 2017-12-15 DIAGNOSIS — E119 Type 2 diabetes mellitus without complications: Secondary | ICD-10-CM | POA: Diagnosis not present

## 2017-12-15 DIAGNOSIS — I5022 Chronic systolic (congestive) heart failure: Secondary | ICD-10-CM | POA: Diagnosis not present

## 2017-12-15 DIAGNOSIS — I110 Hypertensive heart disease with heart failure: Secondary | ICD-10-CM | POA: Diagnosis not present

## 2017-12-15 DIAGNOSIS — J449 Chronic obstructive pulmonary disease, unspecified: Secondary | ICD-10-CM | POA: Diagnosis not present

## 2017-12-15 DIAGNOSIS — F17210 Nicotine dependence, cigarettes, uncomplicated: Secondary | ICD-10-CM | POA: Diagnosis not present

## 2017-12-15 DIAGNOSIS — I429 Cardiomyopathy, unspecified: Secondary | ICD-10-CM | POA: Diagnosis not present

## 2017-12-18 DIAGNOSIS — E119 Type 2 diabetes mellitus without complications: Secondary | ICD-10-CM | POA: Diagnosis not present

## 2017-12-18 DIAGNOSIS — I429 Cardiomyopathy, unspecified: Secondary | ICD-10-CM | POA: Diagnosis not present

## 2017-12-18 DIAGNOSIS — I110 Hypertensive heart disease with heart failure: Secondary | ICD-10-CM | POA: Diagnosis not present

## 2017-12-18 DIAGNOSIS — J449 Chronic obstructive pulmonary disease, unspecified: Secondary | ICD-10-CM | POA: Diagnosis not present

## 2017-12-18 DIAGNOSIS — F17210 Nicotine dependence, cigarettes, uncomplicated: Secondary | ICD-10-CM | POA: Diagnosis not present

## 2017-12-18 DIAGNOSIS — I5022 Chronic systolic (congestive) heart failure: Secondary | ICD-10-CM | POA: Diagnosis not present

## 2017-12-18 NOTE — Progress Notes (Signed)
Patient ID: Jorge Cruz, male    DOB: January 22, 1951, 67 y.o.   MRN: [id number]  HPI  [name] is a 67 y/o male with a history of DM, COPD, HTN, current tobacco use and chronic heart failure.  Echo report from 11/25/17 reviewed and showed an EF of 20-25% along with a mildly elevated PA pressure of 35 mm Hg. Echo report from 04/01/16 was reviewed and shows an EF of 20-25% along with mild MR. Cardiac catheterization was done 06/01/17 which showed an EF of 20% along with mild nonobstructive CAD. RHC also done which showed normal filling pressures, normal pulmonary pressure and moderately reduced cardiac output. Cardiac output was 3.82 with a cardiac index of 1.83.  Admitted 11/24/17 due to HF/ COPD exacerbation. Initially needed intubation. Cardiology consult obtained. Discharged after 7 days.   He presents today for a follow-up visit with a chief complaint of light-headedness. He says that this occurs intermittently and did occur this morning. He says this has been present for several years and just occurs occasionally. He has associated neck pain and rhinorrhea along with this. He denies any difficulty sleeping, abdominal distention, palpitations, pedal edema, chest pain, shortness of breath, cough, fatigue or weight gain.   Past Medical History:  Diagnosis Date  . Chronic combined systolic (congestive) and diastolic (congestive) heart failure (HCC)    a. 03/2014 Echo: EF 45%, glob HK; b. 03/2016 Echo: EF 20-25%, diff HK, Gr1 DD, mild MR; b. 11/2017 Echo: EF 20%, diff HK, Gr2 DD, mildly to mod reduced RV fxn, PASP .  . CKD (chronic kidney disease), stage III (HCC)   . COPD (chronic obstructive pulmonary disease) (HCC)   . Diabetes mellitus without complication (HCC)   . Hypertensive heart disease   . NICM (nonischemic cardiomyopathy) (HCC)    a. 03/2014 Echo: EF 45%, glob HK, mild conc LVH; b. 03/2014 MV: possible mild ischemia, superimposed on small inf infarct, EF 36%; c. 03/2016 MV: EF <30%, no  ischemia; d. 03/2016 Echo: EF 20-25%, diff HK, Gr1 DD, mild MR; e. 05/2017 Cath: nonobs dzs, EF 20%; f. 11/2017 Echo: EF 20%, diff HK, Gr2 DD.  . Non-obstructive CAD (coronary artery disease)    a. 05/2017 Cath: LM nl, LAD 41m, LCX 50m, RCA 75m, EF 20%.  . Polysubstance abuse (HCC)   . Tobacco abuse    Past Surgical History:  Procedure Laterality Date  . RIGHT/LEFT HEART CATH AND CORONARY ANGIOGRAPHY N/A 06/01/2017   Procedure: RIGHT/LEFT HEART CATH AND CORONARY ANGIOGRAPHY;  Surgeon: Iran Ouch, MD;  Location: [facility] INVASIVE CV LAB;  Service: Cardiovascular;  Laterality: N/A;   Family History  Problem Relation Age of Onset  . Diabetes Mother   . Diabetes Father    Social History   Tobacco Use  . Smoking status: Current Some Day Smoker    Packs/day: 0.25    Years: 50.00    Pack years: 12.50  . Smokeless tobacco: Never Used  Substance Use Topics  . Alcohol use: No   No Known Allergies  Prior to Admission medications   Medication Sig Start Date End Date Taking? Authorizing Provider  albuterol (PROVENTIL HFA;VENTOLIN HFA) 108 (90 Base) MCG/ACT inhaler Inhale 2 puffs into the lungs every 6 (six) hours as needed for wheezing or shortness of breath.   Yes [provider]  aspirin EC 81 MG tablet Take 1 tablet (81 mg total) by mouth daily. 04/01/16  Yes Enedina Finner, MD  atorvastatin (LIPITOR) 80 MG tablet Take 80 mg by  mouth daily.   Yes [provider]  carvedilol (COREG) 3.125 MG tablet Take 1 tablet (3.125 mg total) by mouth 2 (two) times daily. 12/13/17 03/13/18 Yes End, [name], MD  Fluticasone-Salmeterol (ADVAIR DISKUS) 250-50 MCG/DOSE AEPB Inhale 1 puff into the lungs 2 (two) times daily. 12/01/17 12/01/18 Yes Auburn Bilberry, MD  furosemide (LASIX) 20 MG tablet Take 1 tablet (20 mg total) by mouth daily. 05/03/16 05/30/18 Yes End, [name], MD  guaiFENesin-dextromethorphan (ROBITUSSIN DM) 100-10 MG/5ML syrup Take 5 mLs by mouth every 4 (four) hours as  needed for cough. 12/01/17  Yes Auburn Bilberry, MD  insulin detemir (LEVEMIR) 100 UNIT/ML injection Inject 0.3 mLs (30 Units total) into the skin at bedtime. Patient taking differently: Inject 35 Units into the skin at bedtime.  12/01/17  Yes Auburn Bilberry, MD  ipratropium-albuterol (DUONEB) 0.5-2.5 (3) MG/3ML SOLN Take 3 mLs by nebulization every 4 (four) hours as needed. 12/01/17  Yes Auburn Bilberry, MD  metFORMIN (GLUCOPHAGE) 1000 MG tablet Take 1 tablet by mouth 2 (two) times daily.   Yes [provider]  nitroGLYCERIN (NITROSTAT) 0.4 MG SL tablet Place 0.4 mg under the tongue every 5 (five) minutes as needed for chest pain.   Yes [provider]  sacubitril-valsartan (ENTRESTO) 24-26 MG Take 1 tablet by mouth 2 (two) times daily. 07/26/17  Yes Delma Freeze, FNP    Review of Systems  Constitutional: Negative for appetite change and fatigue.  HENT: Positive for rhinorrhea. Negative for congestion and sore throat.   Eyes: Negative.   Respiratory: Negative for cough, chest tightness and shortness of breath.   Cardiovascular: Negative for chest pain, palpitations and leg swelling.  Gastrointestinal: Negative for abdominal distention and abdominal pain.  Endocrine: Negative.   Genitourinary: Negative.   Musculoskeletal: Positive for neck pain (at times). Negative for back pain.  Skin: Negative.   Allergic/Immunologic: Negative.   Neurological: Positive for light-headedness (this morning). Negative for dizziness.  Hematological: Negative for adenopathy. Does not bruise/bleed easily.  Psychiatric/Behavioral: Negative for dysphoric mood and sleep disturbance (sleeping on 2 pillows). The patient is not nervous/anxious.    Vitals:   12/20/17 0940  BP: 96/81  Pulse: 78  Resp: 18  SpO2: 99%  Weight: 212 lb 6 oz (96.3 kg)  Height: 5\' 8"  (1.727 m)   Wt Readings from Last 3 Encounters:  12/20/17 212 lb 6 oz (96.3 kg)  12/13/17 212 lb (96.2 kg)  12/01/17 217 lb 1.6 oz  (98.5 kg)   Lab Results  Component Value Date   CREATININE 1.28 (H) 12/01/2017   CREATININE 1.43 (H) 11/30/2017   CREATININE 1.40 (H) 11/28/2017    Physical Exam  Constitutional: He is oriented to person, place, and time. He appears well-developed and well-nourished.  HENT:  Head: Normocephalic and atraumatic.  Neck: Normal range of motion. Neck supple. No JVD present.  Cardiovascular: Normal rate and regular rhythm.  Pulmonary/Chest: Effort normal. He has no wheezes. He has no rales.  Abdominal: Soft. He exhibits no distension. There is no tenderness.  Musculoskeletal: He exhibits no edema or tenderness.  Neurological: He is alert and oriented to person, place, and time.  Skin: Skin is warm and dry.  Psychiatric: He has a normal mood and affect. His behavior is normal. Thought content normal.  Nursing note and vitals reviewed.   Assessment & Plan:  1: Chronic heart failure with reduced ejection fraction- - NYHA class I - euvolemic today - weighing daily. Reminded  to call for an overnight weight gain  of >2 pounds or a weekly weight gain of >5 pounds - weight stable since the last time he was here - not adding salt and has been reading food labels. Reviewed the importance of keeping sodium intake to 2000mg  daily.  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 and says that he doesn't have any difficulty in doing the job - BNP 11/29/17 was 539.0 - PharmD reconciled medications with the patient  2: HTN- - BP low today so will not titrate up carvediolol or entresto at this time - BMP from 12/01/17 reviewed and shows sodium 132, potassium 4.2 and GFR >60 - says that he saw his PCP at [facility] a few weeks ago and returns 01/01/18  3: DM- - nonfasting glucose in clinic today was 151 - A1c on 05/31/17 was 7.6%,  down from 9.9% back in August 2017  4:Tobacco use-  - smokes 2 cigarettes daily - complete cessation discussed for 3 minutes with the patient  Patient did not bring his medications nor a list. Each medication was verbally reviewed with the patient and he was encouraged to bring the bottles to every visit to confirm accuracy of list.  Return in 3 months or sooner for any questions/problems before then.

## 2017-12-20 ENCOUNTER — Encounter: Payer: Self-pay | Admitting: Family

## 2017-12-20 ENCOUNTER — Ambulatory Visit: Payer: Medicare Other | Attending: Family | Admitting: Family

## 2017-12-20 VITALS — BP 96/81 | HR 78 | Resp 18 | Ht 68.0 in | Wt 212.4 lb

## 2017-12-20 DIAGNOSIS — J449 Chronic obstructive pulmonary disease, unspecified: Secondary | ICD-10-CM | POA: Insufficient documentation

## 2017-12-20 DIAGNOSIS — I10 Essential (primary) hypertension: Secondary | ICD-10-CM

## 2017-12-20 DIAGNOSIS — Z79899 Other long term (current) drug therapy: Secondary | ICD-10-CM | POA: Insufficient documentation

## 2017-12-20 DIAGNOSIS — I2510 Atherosclerotic heart disease of native coronary artery without angina pectoris: Secondary | ICD-10-CM | POA: Insufficient documentation

## 2017-12-20 DIAGNOSIS — N183 Chronic kidney disease, stage 3 (moderate): Secondary | ICD-10-CM | POA: Insufficient documentation

## 2017-12-20 DIAGNOSIS — I130 Hypertensive heart and chronic kidney disease with heart failure and stage 1 through stage 4 chronic kidney disease, or unspecified chronic kidney disease: Secondary | ICD-10-CM | POA: Diagnosis not present

## 2017-12-20 DIAGNOSIS — Z7982 Long term (current) use of aspirin: Secondary | ICD-10-CM | POA: Insufficient documentation

## 2017-12-20 DIAGNOSIS — Z833 Family history of diabetes mellitus: Secondary | ICD-10-CM | POA: Diagnosis not present

## 2017-12-20 DIAGNOSIS — E1122 Type 2 diabetes mellitus with diabetic chronic kidney disease: Secondary | ICD-10-CM | POA: Diagnosis not present

## 2017-12-20 DIAGNOSIS — Z720 Tobacco use: Secondary | ICD-10-CM

## 2017-12-20 DIAGNOSIS — Z794 Long term (current) use of insulin: Secondary | ICD-10-CM

## 2017-12-20 DIAGNOSIS — I428 Other cardiomyopathies: Secondary | ICD-10-CM | POA: Insufficient documentation

## 2017-12-20 DIAGNOSIS — I5022 Chronic systolic (congestive) heart failure: Secondary | ICD-10-CM | POA: Diagnosis not present

## 2017-12-20 DIAGNOSIS — F17210 Nicotine dependence, cigarettes, uncomplicated: Secondary | ICD-10-CM | POA: Insufficient documentation

## 2017-12-20 DIAGNOSIS — E119 Type 2 diabetes mellitus without complications: Secondary | ICD-10-CM

## 2017-12-20 DIAGNOSIS — I509 Heart failure, unspecified: Secondary | ICD-10-CM | POA: Diagnosis present

## 2017-12-20 LAB — GLUCOSE, CAPILLARY: Glucose-Capillary: 151 mg/dL — ABNORMAL HIGH (ref 65–99)

## 2017-12-20 NOTE — Patient Instructions (Addendum)
Continue weighing daily and call for an overnight weight gain of > 2 pounds or a weekly weight gain of >5 pounds.    Smoking Cessation Quitting smoking is important to your health and has many advantages. However, it is not always easy to quit since nicotine is a very addictive drug. Oftentimes, people try 3 times or more before being able to quit. This document explains the best ways for you to prepare to quit smoking. Quitting takes hard work and a lot of effort, but you can do it. ADVANTAGES OF QUITTING SMOKING  You will live longer, feel better, and live better.  Your body will feel the impact of quitting smoking almost immediately.  Within 20 minutes, blood pressure decreases. Your pulse returns to its normal level.  After 8 hours, carbon monoxide levels in the blood return to normal. Your oxygen level increases.  After 24 hours, the chance of having a heart attack starts to decrease. Your breath, hair, and body stop smelling like smoke.  After 48 hours, damaged nerve endings begin to recover. Your sense of taste and smell improve.  After 72 hours, the body is virtually free of nicotine. Your bronchial tubes relax and breathing becomes easier.  After 2 to 12 weeks, lungs can hold more air. Exercise becomes easier and circulation improves.  The risk of having a heart attack, stroke, cancer, or lung disease is greatly reduced.  After 1 year, the risk of coronary heart disease is cut in half.  After 5 years, the risk of stroke falls to the same as a nonsmoker.  After 10 years, the risk of lung cancer is cut in half and the risk of other cancers decreases significantly.  After 15 years, the risk of coronary heart disease drops, usually to the level of a nonsmoker.  If you are pregnant, quitting smoking will improve your chances of having a healthy baby.  The people you live with, especially any children, will be healthier.  You will have extra money to spend on things other  than cigarettes. QUESTIONS TO THINK ABOUT BEFORE ATTEMPTING TO QUIT You may want to talk about your answers with your health care provider.  Why do you want to quit?  If you tried to quit in the past, what helped and what did not?  What will be the most difficult situations for you after you quit? How will you plan to handle them?  Who can help you through the tough times? Your family? Friends? A health care provider?  What pleasures do you get from smoking? What ways can you still get pleasure if you quit? Here are some questions to ask your health care provider:  How can you help me to be successful at quitting?  What medicine do you think would be best for me and how should I take it?  What should I do if I need more help?  What is smoking withdrawal like? How can I get information on withdrawal? GET READY  Set a quit date.  Change your environment by getting rid of all cigarettes, ashtrays, matches, and lighters in your home, car, or work. Do not let people smoke in your home.  Review your past attempts to quit. Think about what worked and what did not. GET SUPPORT AND ENCOURAGEMENT You have a better chance of being successful if you have help. You can get support in many ways.  Tell your family, friends, and coworkers that you are going to quit and need their support. Ask   them not to smoke around you.  Get individual, group, or telephone counseling and support. Programs are available at local hospitals and health centers. Call your local health department for information about programs in your area.  Spiritual beliefs and practices may help some smokers quit.  Download a "quit meter" on your computer to keep track of quit statistics, such as how long you have gone without smoking, cigarettes not smoked, and money saved.  Get a self-help book about quitting smoking and staying off tobacco. LEARN NEW SKILLS AND BEHAVIORS  Distract yourself from urges to smoke. Talk to  someone, go for a walk, or occupy your time with a task.  Change your normal routine. Take a different route to work. Drink tea instead of coffee. Eat breakfast in a different place.  Reduce your stress. Take a hot bath, exercise, or read a book.  Plan something enjoyable to do every day. Reward yourself for not smoking.  Explore interactive web-based programs that specialize in helping you quit. GET MEDICINE AND USE IT CORRECTLY Medicines can help you stop smoking and decrease the urge to smoke. Combining medicine with the above behavioral methods and support can greatly increase your chances of successfully quitting smoking.  Nicotine replacement therapy helps deliver nicotine to your body without the negative effects and risks of smoking. Nicotine replacement therapy includes nicotine gum, lozenges, inhalers, nasal sprays, and skin patches. Some may be available over-the-counter and others require a prescription.  Antidepressant medicine helps people abstain from smoking, but how this works is unknown. This medicine is available by prescription.  Nicotinic receptor partial agonist medicine simulates the effect of nicotine in your brain. This medicine is available by prescription. Ask your health care provider for advice about which medicines to use and how to use them based on your health history. Your health care provider will tell you what side effects to look out for if you choose to be on a medicine or therapy. Carefully read the information on the package. Do not use any other product containing nicotine while using a nicotine replacement product.  RELAPSE OR DIFFICULT SITUATIONS Most relapses occur within the first 3 months after quitting. Do not be discouraged if you start smoking again. Remember, most people try several times before finally quitting. You may have symptoms of withdrawal because your body is used to nicotine. You may crave cigarettes, be irritable, feel very hungry, cough  often, get headaches, or have difficulty concentrating. The withdrawal symptoms are only temporary. They are strongest when you first quit, but they will go away within 10-14 days. To reduce the chances of relapse, try to:  Avoid drinking alcohol. Drinking lowers your chances of successfully quitting.  Reduce the amount of caffeine you consume. Once you quit smoking, the amount of caffeine in your body increases and can give you symptoms, such as a rapid heartbeat, sweating, and anxiety.  Avoid smokers because they can make you want to smoke.  Do not let weight gain distract you. Many smokers will gain weight when they quit, usually less than 10 pounds. Eat a healthy diet and stay active. You can always lose the weight gained after you quit.  Find ways to improve your mood other than smoking. FOR MORE INFORMATION  www.smokefree.gov  Document Released: 08/02/2001 Document Revised: 12/23/2013 Document Reviewed: 11/17/2011 ExitCare Patient Information 2015 [employer]. This information is not intended to replace advice given to you by your health care provider. Make sure you discuss any questions you have with your   health care provider.  

## 2017-12-21 DIAGNOSIS — I110 Hypertensive heart disease with heart failure: Secondary | ICD-10-CM | POA: Diagnosis not present

## 2017-12-21 DIAGNOSIS — I429 Cardiomyopathy, unspecified: Secondary | ICD-10-CM | POA: Diagnosis not present

## 2017-12-21 DIAGNOSIS — J449 Chronic obstructive pulmonary disease, unspecified: Secondary | ICD-10-CM | POA: Diagnosis not present

## 2017-12-21 DIAGNOSIS — F17210 Nicotine dependence, cigarettes, uncomplicated: Secondary | ICD-10-CM | POA: Diagnosis not present

## 2017-12-21 DIAGNOSIS — E119 Type 2 diabetes mellitus without complications: Secondary | ICD-10-CM | POA: Diagnosis not present

## 2017-12-21 DIAGNOSIS — I5022 Chronic systolic (congestive) heart failure: Secondary | ICD-10-CM | POA: Diagnosis not present

## 2018-01-17 ENCOUNTER — Ambulatory Visit (INDEPENDENT_AMBULATORY_CARE_PROVIDER_SITE_OTHER): Payer: Medicare Other | Admitting: Nurse Practitioner

## 2018-01-17 ENCOUNTER — Encounter: Payer: Self-pay | Admitting: Nurse Practitioner

## 2018-01-17 VITALS — BP 122/60 | HR 81 | Ht 68.0 in | Wt 214.2 lb

## 2018-01-17 DIAGNOSIS — I2510 Atherosclerotic heart disease of native coronary artery without angina pectoris: Secondary | ICD-10-CM | POA: Diagnosis not present

## 2018-01-17 DIAGNOSIS — I5042 Chronic combined systolic (congestive) and diastolic (congestive) heart failure: Secondary | ICD-10-CM

## 2018-01-17 DIAGNOSIS — I428 Other cardiomyopathies: Secondary | ICD-10-CM | POA: Diagnosis not present

## 2018-01-17 MED ORDER — SACUBITRIL-VALSARTAN 49-51 MG PO TABS: 1.0000 | ORAL_TABLET | Freq: Two times a day (BID) | ORAL | 6 refills | Status: DC

## 2018-01-17 NOTE — Patient Instructions (Addendum)
Medication Instructions: - Your physician has recommended you make the following change in your medication:   1) INCREASE Entresto 49/51 mg - take 1 tablet by mouth twice daily  (you may take Entresto 24/26 mg- 2 tablets twice daily until you use them up)  Labwork: - none ordered  Procedures/Testing: - none ordered  Follow-Up:  - follow up with Clarisa Kindred, NP as scheduled- 03/13/18  - Your physician recommends that you schedule a follow-up appointment in: 4 months with Dr. Okey Dupre.   Any Additional Special Instructions Will Be Listed Below (If Applicable).     If you need a refill on your cardiac medications before your next appointment, please call your pharmacy.

## 2018-01-17 NOTE — Progress Notes (Signed)
Office Visit    Patient Name: Jorge Cruz Date of Encounter: 01/17/2018  Primary Care Provider:  [facility] Primary Cardiologist:  Yvonne Kendall, MD  Chief Complaint    67 y/o ? with a history of chronic combined systolic and diastolic congestive heart failure, stage III chronic kidney disease, COPD, diabetes, hypertension, nonischemic cardia myopathy, nonobstructive CAD, and remote tobacco and substance abuse, who presents for follow-up.  Past Medical History    Past Medical History:  Diagnosis Date  . Chronic combined systolic (congestive) and diastolic (congestive) heart failure (HCC)    a. 03/2014 Echo: EF 45%, glob HK; b. 03/2016 Echo: EF 20-25%, diff HK, Gr1 DD, mild MR; b. 11/2017 Echo: EF 20%, diff HK, Gr2 DD, mildly to mod reduced RV fxn, PASP .  . CKD (chronic kidney disease), stage III (HCC)   . COPD (chronic obstructive pulmonary disease) (HCC)   . Diabetes mellitus without complication (HCC)   . Hypertensive heart disease   . NICM (nonischemic cardiomyopathy) (HCC)    a. 03/2014 Echo: EF 45%, glob HK, mild conc LVH; b. 03/2014 MV: possible mild ischemia, superimposed on small inf infarct, EF 36%; c. 03/2016 MV: EF <30%, no ischemia; d. 03/2016 Echo: EF 20-25%, diff HK, Gr1 DD, mild MR; e. 05/2017 Cath: nonobs dzs, EF 20%; f. 11/2017 Echo: EF 20%, diff HK, Gr2 DD.  . Non-obstructive CAD (coronary artery disease)    a. 05/2017 Cath: LM nl, LAD 64m, LCX 62m, RCA 32m, EF 20%.  . Polysubstance abuse (HCC)   . Tobacco abuse    Past Surgical History:  Procedure Laterality Date  . RIGHT/LEFT HEART CATH AND CORONARY ANGIOGRAPHY N/A 06/01/2017   Procedure: RIGHT/LEFT HEART CATH AND CORONARY ANGIOGRAPHY;  Surgeon: Iran Ouch, MD;  Location: [facility] INVASIVE CV LAB;  Service: Cardiovascular;  Laterality: N/A;    Allergies  No Known Allergies  History of Present Illness    67 year old male with the above complex past medical history including  chronic combined systolic and diastolic congestive heart failure, nonischemic cardiomyopathy, nonobstructive CAD, hypertension, diabetes, stage III chronic kidney disease, COPD, and remote polysubstance abuse.  He was hospitalized in April with dyspnea and heart failure and briefly required intubation and ventilation.  He followed up in late April, at which time he was stable and placed on carvedilol 3.125 mg twice daily.  He was seen in heart failure clinic on May 1, at which time he was stable.  Blood pressure was soft and they were not able to titrate Entresto.  Since then, he is continued to do well.  Weight has been stable at home.  He denies chest pain, dyspnea, palpitations, PND, orthopnea, dizziness, syncope, edema, or early satiety.  He reports compliance with medications.  He tries to watch his salt closely.  Home Medications    Prior to Admission medications   Medication Sig Start Date End Date Taking? Authorizing Provider  albuterol (PROVENTIL HFA;VENTOLIN HFA) 108 (90 Base) MCG/ACT inhaler Inhale 2 puffs into the lungs every 6 (six) hours as needed for wheezing or shortness of breath.    [provider]  aspirin EC 81 MG tablet Take 1 tablet (81 mg total) by mouth daily. 04/01/16   Enedina Finner, MD  atorvastatin (LIPITOR) 80 MG tablet Take 80 mg by mouth daily.    [provider]  carvedilol (COREG) 3.125 MG tablet Take 1 tablet (3.125 mg total) by mouth 2 (two) times daily. 12/13/17 03/13/18  End, [name], MD  Fluticasone-Salmeterol (  ADVAIR DISKUS) 250-50 MCG/DOSE AEPB Inhale 1 puff into the lungs 2 (two) times daily. 12/01/17 12/01/18  Auburn Bilberry, MD  furosemide (LASIX) 20 MG tablet Take 1 tablet (20 mg total) by mouth daily. 05/03/16 05/30/18  End, [name], MD  guaiFENesin-dextromethorphan (ROBITUSSIN DM) 100-10 MG/5ML syrup Take 5 mLs by mouth every 4 (four) hours as needed for cough. 12/01/17   Auburn Bilberry, MD  insulin detemir (LEVEMIR) 100 UNIT/ML injection  Inject 0.3 mLs (30 Units total) into the skin at bedtime. Patient taking differently: Inject 35 Units into the skin at bedtime.  12/01/17   Auburn Bilberry, MD  ipratropium-albuterol (DUONEB) 0.5-2.5 (3) MG/3ML SOLN Take 3 mLs by nebulization every 4 (four) hours as needed. 12/01/17   Auburn Bilberry, MD  metFORMIN (GLUCOPHAGE) 1000 MG tablet Take 1 tablet by mouth 2 (two) times daily.    [provider]  nitroGLYCERIN (NITROSTAT) 0.4 MG SL tablet Place 0.4 mg under the tongue every 5 (five) minutes as needed for chest pain.    [provider]  sacubitril-valsartan (ENTRESTO) 24-26 MG Take 1 tablet by mouth 2 (two) times daily. 07/26/17   Delma Freeze, FNP    Review of Systems    He denies chest pain, palpitations, dyspnea, pnd, orthopnea, n, v, dizziness, syncope, edema, weight gain, or early satiety.  All other systems reviewed and are otherwise negative except as noted above.  Physical Exam    VS:  BP 122/60 (BP Location: Left Arm, Patient Position: Sitting, Cuff Size: Normal)   Pulse 81   Ht 5\' 8"  (1.727 m)   Wt 214 lb 4 oz (97.2 kg)   BMI 32.58 kg/m  , BMI Body mass index is 32.58 kg/m. GEN: Well nourished, well developed, in no acute distress.  HEENT: normal.  Neck: Supple, no JVD, carotid bruits, or masses. Cardiac: RRR, no murmurs, rubs, or gallops. No clubbing, cyanosis, edema.  Radials/DP/PT 2+ and equal bilaterally.  Respiratory:  Respirations regular and unlabored, clear to auscultation bilaterally. GI: Soft, nontender, nondistended, BS + x 4. MS: no deformity or atrophy. Skin: warm and dry, no rash. Neuro:  Strength and sensation are intact. Psych: Normal affect.  Accessory Clinical Findings    ECG -regular sinus rhythm, PAC, 81, poor R wave progression no acute changes.  Assessment & Plan    1.  Chronic combined systolic diastolic congestive heart failure/nonischemic cardia myopathy: Euvolemic on exam.  Reports compliance with meds and diet.   Weight relatively stable at home.  He remains on beta-blocker, Entresto, and low-dose Lasix therapy.  Blood pressure stable today and will take this opportunity to increase his Entresto to the 49-51 dose.  Follow-up basic metabolic panel in a week.  We will continue to consider spironolactone initiation in the future if able.  2.  Nonobstructive CAD: Catheterization October 2018 showing minimal disease.  No chest pain.  Continue aspirin and statin therapy.  3.  Hyperlipidemia: Last LDL was 99 in August 2017.  Continue statin therapy with plan for follow-up lipids and LFTs in the future.  4.  Type 2 diabetes mellitus: Remains on metformin and Levemir.  We did briefly discussed that he would likely benefit from initiation of in the future. jardiance.  Defer to primary care.  5.  Disposition: He has follow-up in heart failure clinic in July.  We will plan on follow-up here in September.  Nicolasa Ducking, NP 01/17/2018, 6:10 PM

## 2018-01-30 ENCOUNTER — Other Ambulatory Visit: Payer: Self-pay | Admitting: Family

## 2018-01-31 ENCOUNTER — Other Ambulatory Visit: Payer: Self-pay

## 2018-01-31 MED ORDER — SACUBITRIL-VALSARTAN 49-51 MG PO TABS: 1.0000 | ORAL_TABLET | Freq: Two times a day (BID) | ORAL | 3 refills | Status: DC

## 2018-01-31 NOTE — Telephone Encounter (Signed)
Requested Prescriptions   Signed Prescriptions Disp Refills  . sacubitril-valsartan (ENTRESTO) 49-51 MG 180 tablet 3    Sig: Take 1 tablet by mouth 2 (two) times daily.    Authorizing Provider: BERGE, CHRISTOPHER RONALD    Ordering User: LOPEZ, MARINA C    

## 2018-01-31 NOTE — Telephone Encounter (Signed)
*  STAT* If patient is at the pharmacy, call can be transferred to refill team.   1. Which medications need to be refilled? (please list name of each medication and dose if known) Entresto  2. Which pharmacy/location (including street and city if local pharmacy) is medication to be sent to? [facility] [place]   3. Do they need a 30 day or 90 day supply? 90   

## 2018-01-31 NOTE — Telephone Encounter (Signed)
Requested Prescriptions   Signed Prescriptions Disp Refills  . sacubitril-valsartan (ENTRESTO) 49-51 MG 180 tablet 3    Sig: Take 1 tablet by mouth 2 (two) times daily.    Authorizing Provider: Creig Hines    Ordering User: Kendrick Fries

## 2018-03-13 ENCOUNTER — Ambulatory Visit: Payer: Medicare Other | Admitting: Family

## 2018-03-13 ENCOUNTER — Telehealth: Payer: Self-pay | Admitting: Family

## 2018-03-13 NOTE — Progress Notes (Deleted)
Patient ID: Jorge Cruz, male    DOB: 02/24/1951, 67 y.o.   MRN: [id number]  HPI  Jorge Cruz is a 67 y/o male with a history of DM, COPD, HTN, current tobacco use and chronic heart failure.  Echo report from 11/25/17 reviewed and showed an EF of 20-25% along with a mildly elevated PA pressure of 35 mm Hg. Echo report from 04/01/16 was reviewed and shows an EF of 20-25% along with mild Jorge. Cardiac catheterization was done 06/01/17 which showed an EF of 20% along with mild nonobstructive CAD. RHC also done which showed normal filling pressures, normal pulmonary pressure and moderately reduced cardiac output. Cardiac output was 3.82 with a cardiac index of 1.83.  Admitted 11/24/17 due to HF/ COPD exacerbation. Initially needed intubation. Cardiology consult obtained. Discharged after 7 days.   He presents today for a follow-up visit with a chief complaint of  Past Medical History:  Diagnosis Date  . Chronic combined systolic (congestive) and diastolic (congestive) heart failure (HCC)    a. 03/2014 Echo: EF 45%, glob HK; b. 03/2016 Echo: EF 20-25%, diff HK, Gr1 DD, mild Jorge; b. 11/2017 Echo: EF 20%, diff HK, Gr2 DD, mildly to mod reduced RV fxn, PASP .  . CKD (chronic kidney disease), stage III (HCC)   . COPD (chronic obstructive pulmonary disease) (HCC)   . Diabetes mellitus without complication (HCC)   . Hypertensive heart disease   . NICM (nonischemic cardiomyopathy) (HCC)    a. 03/2014 Echo: EF 45%, glob HK, mild conc LVH; b. 03/2014 MV: possible mild ischemia, superimposed on small inf infarct, EF 36%; c. 03/2016 MV: EF <30%, no ischemia; d. 03/2016 Echo: EF 20-25%, diff HK, Gr1 DD, mild Jorge; e. 05/2017 Cath: nonobs dzs, EF 20%; f. 11/2017 Echo: EF 20%, diff HK, Gr2 DD.  . Non-obstructive CAD (coronary artery disease)    a. 05/2017 Cath: LM nl, LAD 42m, LCX 40m, RCA 52m, EF 20%.  . Polysubstance abuse (HCC)   . Tobacco abuse    Past Surgical History:  Procedure Laterality Date  . RIGHT/LEFT  HEART CATH AND CORONARY ANGIOGRAPHY N/A 06/01/2017   Procedure: RIGHT/LEFT HEART CATH AND CORONARY ANGIOGRAPHY;  Surgeon: Iran Ouch, MD;  Location: [facility] INVASIVE CV LAB;  Service: Cardiovascular;  Laterality: N/A;   Family History  Problem Relation Age of Onset  . Diabetes Mother   . Diabetes Father    Social History   Tobacco Use  . Smoking status: Current Some Day Smoker    Packs/day: 0.25    Years: 50.00    Pack years: 12.50  . Smokeless tobacco: Never Used  Substance Use Topics  . Alcohol use: No   No Known Allergies    Review of Systems  Constitutional: Negative for appetite change and fatigue.  HENT: Positive for rhinorrhea. Negative for congestion and sore throat.   Eyes: Negative.   Respiratory: Negative for cough, chest tightness and shortness of breath.   Cardiovascular: Negative for chest pain, palpitations and leg swelling.  Gastrointestinal: Negative for abdominal distention and abdominal pain.  Endocrine: Negative.   Genitourinary: Negative.   Musculoskeletal: Positive for neck pain (at times). Negative for back pain.  Skin: Negative.   Allergic/Immunologic: Negative.   Neurological: Positive for light-headedness (this morning). Negative for dizziness.  Hematological: Negative for adenopathy. Does not bruise/bleed easily.  Psychiatric/Behavioral: Negative for dysphoric mood and sleep disturbance (sleeping on 2 pillows). The patient is not nervous/anxious.      Physical Exam  Constitutional: He is  oriented to person, place, and time. He appears well-developed and well-nourished.  HENT:  Head: Normocephalic and atraumatic.  Neck: Normal range of motion. Neck supple. No JVD present.  Cardiovascular: Normal rate and regular rhythm.  Pulmonary/Chest: Effort normal. He has no wheezes. He has no rales.  Abdominal: Soft. He exhibits no distension. There is no tenderness.  Musculoskeletal: He exhibits no edema or tenderness.  Neurological: He is alert  and oriented to person, place, and time.  Skin: Skin is warm and dry.  Psychiatric: He has a normal mood and affect. His behavior is normal. Thought content normal.  Nursing note and vitals reviewed.   Assessment & Plan:  1: Chronic heart failure with reduced ejection fraction- - NYHA class I - euvolemic today - weighing daily. Reminded  to call for an overnight weight gain of >2 pounds or a weekly weight gain of >5 pounds - weight stable since the last time he was here - not adding salt and has been reading food labels. Reviewed the importance of keeping sodium intake to 2000mg  daily.  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 and says that he doesn't have any difficulty in doing the job - BNP 11/29/17 was 539.0 - PharmD reconciled medications with the patient  2: HTN- - BP low today so will not titrate up carvediolol or entresto at this time - BMP from 12/01/17 reviewed and shows sodium 132, potassium 4.2 and GFR >60 - says that he saw his PCP at [facility] a few weeks ago and returns 01/01/18  3: DM- - nonfasting glucose in clinic today was  - A1c on 05/31/17 was 7.6%, down from 9.9% back in August 2017  4:Tobacco use-  - smokes 2 cigarettes daily - complete cessation discussed for 3 minutes with the patient  Patient did not bring his medications nor a list. Each medication was verbally reviewed with the patient and he was encouraged to bring the bottles to every visit to confirm accuracy of list.

## 2018-03-13 NOTE — Telephone Encounter (Signed)
Patient did not show for his Heart Failure Clinic appointment on 03/13/18. Will attempt to reschedule.

## 2018-03-22 DIAGNOSIS — T148XXA Other injury of unspecified body region, initial encounter: Secondary | ICD-10-CM | POA: Diagnosis not present

## 2018-03-22 DIAGNOSIS — S61451A Open bite of right hand, initial encounter: Secondary | ICD-10-CM | POA: Diagnosis not present

## 2018-03-22 DIAGNOSIS — F17200 Nicotine dependence, unspecified, uncomplicated: Secondary | ICD-10-CM | POA: Diagnosis not present

## 2018-03-22 DIAGNOSIS — W540XXA Bitten by dog, initial encounter: Secondary | ICD-10-CM | POA: Diagnosis not present

## 2018-04-09 ENCOUNTER — Ambulatory Visit: Payer: Medicare Other | Attending: Family | Admitting: Family

## 2018-04-09 ENCOUNTER — Encounter: Payer: Self-pay | Admitting: Family

## 2018-04-09 VITALS — BP 113/58 | HR 72 | Resp 18 | Ht 68.0 in | Wt 217.2 lb

## 2018-04-09 DIAGNOSIS — I2510 Atherosclerotic heart disease of native coronary artery without angina pectoris: Secondary | ICD-10-CM | POA: Diagnosis not present

## 2018-04-09 DIAGNOSIS — I5022 Chronic systolic (congestive) heart failure: Secondary | ICD-10-CM

## 2018-04-09 DIAGNOSIS — E1122 Type 2 diabetes mellitus with diabetic chronic kidney disease: Secondary | ICD-10-CM | POA: Diagnosis not present

## 2018-04-09 DIAGNOSIS — I130 Hypertensive heart and chronic kidney disease with heart failure and stage 1 through stage 4 chronic kidney disease, or unspecified chronic kidney disease: Secondary | ICD-10-CM | POA: Diagnosis not present

## 2018-04-09 DIAGNOSIS — I5042 Chronic combined systolic (congestive) and diastolic (congestive) heart failure: Secondary | ICD-10-CM | POA: Insufficient documentation

## 2018-04-09 DIAGNOSIS — Z79899 Other long term (current) drug therapy: Secondary | ICD-10-CM | POA: Diagnosis not present

## 2018-04-09 DIAGNOSIS — N183 Chronic kidney disease, stage 3 (moderate): Secondary | ICD-10-CM | POA: Diagnosis not present

## 2018-04-09 DIAGNOSIS — I10 Essential (primary) hypertension: Secondary | ICD-10-CM

## 2018-04-09 DIAGNOSIS — F17210 Nicotine dependence, cigarettes, uncomplicated: Secondary | ICD-10-CM | POA: Diagnosis not present

## 2018-04-09 DIAGNOSIS — Z7982 Long term (current) use of aspirin: Secondary | ICD-10-CM | POA: Diagnosis not present

## 2018-04-09 DIAGNOSIS — I428 Other cardiomyopathies: Secondary | ICD-10-CM | POA: Insufficient documentation

## 2018-04-09 DIAGNOSIS — Z7984 Long term (current) use of oral hypoglycemic drugs: Secondary | ICD-10-CM | POA: Diagnosis not present

## 2018-04-09 DIAGNOSIS — J449 Chronic obstructive pulmonary disease, unspecified: Secondary | ICD-10-CM | POA: Diagnosis not present

## 2018-04-09 DIAGNOSIS — E119 Type 2 diabetes mellitus without complications: Secondary | ICD-10-CM

## 2018-04-09 DIAGNOSIS — Z794 Long term (current) use of insulin: Secondary | ICD-10-CM

## 2018-04-09 DIAGNOSIS — Z720 Tobacco use: Secondary | ICD-10-CM

## 2018-04-09 NOTE — Patient Instructions (Addendum)
Resume weighing daily and call for an overnight weight gain of > 2 pounds or a weekly weight gain of >5 pounds. 

## 2018-04-09 NOTE — Progress Notes (Signed)
Patient ID: Jorge Cruz, male    DOB: 23-Oct-1950, 67 y.o.   MRN: [id number]  HPI  Jorge Cruz is a 67 y/o male with a history of DM, COPD, HTN, current tobacco use and chronic heart failure.  Echo report from 11/25/17 reviewed and showed an EF of 20-25% along with a mildly elevated PA pressure of 35 mm Hg. Echo report from 04/01/16 was reviewed and shows an EF of 20-25% along with mild Jorge. Cardiac catheterization was done 06/01/17 which showed an EF of 20% along with mild nonobstructive CAD. RHC also done which showed normal filling pressures, normal pulmonary pressure and moderately reduced cardiac output. Cardiac output was 3.82 with a cardiac index of 1.83.  Was in the ED 03/22/18 due to a puncture wound where he was treated and released. Admitted 11/24/17 due to HF/ COPD exacerbation. Initially needed intubation. Cardiology consult obtained. Discharged after 7 days.   He presents today for a follow-up visit with a chief complaint of intermittent light-headedness. He says that this has been present for months and tends to occur if he changes positions too quickly. He has associated neck pain, rare chest pain and gradual weight gain along with this. He denies any difficulty sleeping, abdominal distention, palpitations, edema, shortness of breath, cough or fatigue. Has recently moved into his brother's house due to financial reasons and admits that it's been a difficult adjustment. He is hoping this is for short-term.   Past Medical History:  Diagnosis Date  . Chronic combined systolic (congestive) and diastolic (congestive) heart failure (HCC)    a. 03/2014 Echo: EF 45%, glob HK; b. 03/2016 Echo: EF 20-25%, diff HK, Gr1 DD, mild Jorge; b. 11/2017 Echo: EF 20%, diff HK, Gr2 DD, mildly to mod reduced RV fxn, PASP .  . CKD (chronic kidney disease), stage III (HCC)   . COPD (chronic obstructive pulmonary disease) (HCC)   . Diabetes mellitus without complication (HCC)   . Hypertensive heart disease   . NICM  (nonischemic cardiomyopathy) (HCC)    a. 03/2014 Echo: EF 45%, glob HK, mild conc LVH; b. 03/2014 MV: possible mild ischemia, superimposed on small inf infarct, EF 36%; c. 03/2016 MV: EF <30%, no ischemia; d. 03/2016 Echo: EF 20-25%, diff HK, Gr1 DD, mild Jorge; e. 05/2017 Cath: nonobs dzs, EF 20%; f. 11/2017 Echo: EF 20%, diff HK, Gr2 DD.  . Non-obstructive CAD (coronary artery disease)    a. 05/2017 Cath: LM nl, LAD 36m, LCX 36m, RCA 12m, EF 20%.  . Polysubstance abuse (HCC)   . Tobacco abuse    Past Surgical History:  Procedure Laterality Date  . RIGHT/LEFT HEART CATH AND CORONARY ANGIOGRAPHY N/A 06/01/2017   Procedure: RIGHT/LEFT HEART CATH AND CORONARY ANGIOGRAPHY;  Surgeon: Iran Ouch, MD;  Location: [facility] INVASIVE CV LAB;  Service: Cardiovascular;  Laterality: N/A;   Family History  Problem Relation Age of Onset  . Diabetes Mother   . Diabetes Father    Social History   Tobacco Use  . Smoking status: Current Some Day Smoker    Packs/day: 0.25    Years: 50.00    Pack years: 12.50  . Smokeless tobacco: Never Used  Substance Use Topics  . Alcohol use: No   No Known Allergies  Prior to Admission medications   Medication Sig Start Date End Date Taking? Authorizing Provider  albuterol (PROVENTIL HFA;VENTOLIN HFA) 108 (90 Base) MCG/ACT inhaler Inhale 2 puffs into the lungs every 6 (six) hours as needed for wheezing or shortness of  breath.   Yes [provider]  aspirin EC 81 MG tablet Take 1 tablet (81 mg total) by mouth daily. 04/01/16  Yes Enedina Finner, MD  atorvastatin (LIPITOR) 80 MG tablet Take 80 mg by mouth daily.   Yes [provider]  carvedilol (COREG) 3.125 MG tablet Take 3.125 mg by mouth 2 (two) times daily with a meal.   Yes [provider]  furosemide (LASIX) 20 MG tablet Take 1 tablet (20 mg total) by mouth daily. 05/03/16 05/30/18 Yes End, [name], MD  ipratropium-albuterol (DUONEB) 0.5-2.5 (3) MG/3ML SOLN Take 3 mLs by nebulization every  4 (four) hours as needed. 12/01/17  Yes Auburn Bilberry, MD  metFORMIN (GLUCOPHAGE) 1000 MG tablet Take 1 tablet by mouth 2 (two) times daily.   Yes [provider]  sacubitril-valsartan (ENTRESTO) 49-51 MG Take 1 tablet by mouth 2 (two) times daily. 01/31/18  Yes Creig Hines, NP  carvedilol (COREG) 3.125 MG tablet Take 1 tablet (3.125 mg total) by mouth 2 (two) times daily. 12/13/17 03/13/18  End, [name], MD  Fluticasone-Salmeterol (ADVAIR DISKUS) 250-50 MCG/DOSE AEPB Inhale 1 puff into the lungs 2 (two) times daily. Patient not taking: Reported on 04/09/2018 12/01/17 12/01/18  Auburn Bilberry, MD  nitroGLYCERIN (NITROSTAT) 0.4 MG SL tablet Place 0.4 mg under the tongue every 5 (five) minutes as needed for chest pain.    [provider]    Review of Systems  Constitutional: Negative for appetite change and fatigue.  HENT: Positive for rhinorrhea. Negative for congestion and sore throat.   Eyes: Negative.   Respiratory: Negative for cough, chest tightness and shortness of breath.   Cardiovascular: Positive for chest pain (on occasion). Negative for palpitations and leg swelling.  Gastrointestinal: Negative for abdominal distention and abdominal pain.  Endocrine: Negative.   Genitourinary: Negative.   Musculoskeletal: Positive for neck pain (at times). Negative for back pain.  Skin: Negative.   Allergic/Immunologic: Negative.   Neurological: Positive for light-headedness (on occasion). Negative for dizziness.  Hematological: Negative for adenopathy. Does not bruise/bleed easily.  Psychiatric/Behavioral: Negative for dysphoric mood and sleep disturbance (sleeping on 2 pillows). The patient is not nervous/anxious.    Vitals:   04/09/18 1034  BP: (!) 113/58  Pulse: 72  Resp: 18  SpO2: 99%  Weight: 217 lb 4 oz (98.5 kg)  Height: 5\' 8"  (1.727 m)   Wt Readings from Last 3 Encounters:  04/09/18 217 lb 4 oz (98.5 kg)  01/17/18 214 lb 4 oz (97.2 kg)  12/20/17  212 lb 6 oz (96.3 kg)   Lab Results  Component Value Date   CREATININE 1.28 (H) 12/01/2017   CREATININE 1.43 (H) 11/30/2017   CREATININE 1.40 (H) 11/28/2017    Physical Exam  Constitutional: He is oriented to person, place, and time. He appears well-developed and well-nourished.  HENT:  Head: Normocephalic and atraumatic.  Neck: Normal range of motion. Neck supple. No JVD present.  Cardiovascular: Normal rate and regular rhythm.  Pulmonary/Chest: Effort normal. He has no wheezes. He has no rales.  Abdominal: Soft. He exhibits no distension. There is no tenderness.  Musculoskeletal: He exhibits no edema or tenderness.  Neurological: He is alert and oriented to person, place, and time.  Skin: Skin is warm and dry.  Psychiatric: He has a normal mood and affect. His behavior is normal. Thought content normal.  Nursing note and vitals reviewed.   Assessment & Plan:  1: Chronic heart failure with reduced ejection fraction- - NYHA class I - euvolemic  today - hasn't been weighing daily as his scales got boxed up with recent move. Encouraged to resume weighing and to call for an overnight weight gain of >2 pounds or a weekly weight gain of >5 pounds - weight up 5 pounds since he was last here 3 months ago - not adding salt and has been reading food labels. Reviewed the importance of keeping sodium intake to 2000mg  daily.  - saw cardiology Brion Aliment) 01/17/18 - his work had declined so had to move in with his brother - BNP 11/29/17 was 539.0 - PharmD reconciled medications with the patient  2: HTN- - BP good although on the low side - BP may not tolerate titration of entresto or carvedilol - BMP from 12/01/17 reviewed and shows sodium 132, potassium 4.2 and GFR >60 - says that he saw his PCP at [facility] a few months ago  3: DM- - nonfasting glucose at home was 129 - A1c on 05/31/17 was 7.6%, down from 9.9% back in August 2017  4:Tobacco use-  - smoking <1 ppd of  cigarettes; says that he's been smoking more due to the stress of having to move - complete cessation discussed for 3 minutes with the patient  Patient did not bring his medications nor a list. Each medication was verbally reviewed with the patient and he was encouraged to bring the bottles to every visit to confirm accuracy of list.  Return in 6 months or sooner for any questions/problems before then.

## 2018-05-15 NOTE — Progress Notes (Signed)
Follow-up Outpatient Visit Date: 05/16/2018  Primary Care Provider: [facility], [facility] [address] [place] [address]  Chief Complaint: Follow-up heart failure  HPI:  Mr. Depaulo is a 67 y.o. year-old male with history of chronic systolic and diastolic heart failure secondary to nonischemic cardiomyopathy, nonobstructive coronary artery disease, hypertension, diabetes mellitus, chronic kidney disease stage III, COPD, polysubstance abuse, and medication noncompliance, who presents for follow-up of heart failure.  He was last seen in our office in May by Ward Givens, NP, at which time he was doing well.  Entresto was increased to 49-51 mg twice daily.  He was seen in the ED for a dog bite on the right hand in early August.  He was subsequently seen by Clarisa Kindred, NP, in the heart failure clinic, at which time he was doing well.  Today, Mr. Saco reports that he continues to do well.  He is back to work without any limitations.  He denies chest pain, shortness of breath, palpitations, lightheadedness, orthopnea, PND, and edema.  He is tolerating increased dose of Entresto well.  --------------------------------------------------------------------------------------------------  Past Medical History:  Diagnosis Date  . Chronic combined systolic (congestive) and diastolic (congestive) heart failure (HCC)    a. 03/2014 Echo: EF 45%, glob HK; b. 03/2016 Echo: EF 20-25%, diff HK, Gr1 DD, mild MR; b. 11/2017 Echo: EF 20%, diff HK, Gr2 DD, mildly to mod reduced RV fxn, PASP .  . CKD (chronic kidney disease), stage III (HCC)   . COPD (chronic obstructive pulmonary disease) (HCC)   . Diabetes mellitus without complication (HCC)   . Hypertensive heart disease   . NICM (nonischemic cardiomyopathy) (HCC)    a. 03/2014 Echo: EF 45%, glob HK, mild conc LVH; b. 03/2014 MV: possible mild ischemia, superimposed on small inf infarct, EF 36%; c. 03/2016 MV: EF <30%, no ischemia; d. 03/2016 Echo:  EF 20-25%, diff HK, Gr1 DD, mild MR; e. 05/2017 Cath: nonobs dzs, EF 20%; f. 11/2017 Echo: EF 20%, diff HK, Gr2 DD.  . Non-obstructive CAD (coronary artery disease)    a. 05/2017 Cath: LM nl, LAD 11m, LCX 73m, RCA 42m, EF 20%.  . Polysubstance abuse (HCC)   . Tobacco abuse    Past Surgical History:  Procedure Laterality Date  . RIGHT/LEFT HEART CATH AND CORONARY ANGIOGRAPHY N/A 06/01/2017   Procedure: RIGHT/LEFT HEART CATH AND CORONARY ANGIOGRAPHY;  Surgeon: Iran Ouch, MD;  Location: [facility] INVASIVE CV LAB;  Service: Cardiovascular;  Laterality: N/A;    Current Meds  Medication Sig  . albuterol (PROVENTIL HFA;VENTOLIN HFA) 108 (90 Base) MCG/ACT inhaler Inhale 2 puffs into the lungs every 6 (six) hours as needed for wheezing or shortness of breath.  Marland Kitchen aspirin EC 81 MG tablet Take 1 tablet (81 mg total) by mouth daily.  Marland Kitchen atorvastatin (LIPITOR) 80 MG tablet Take 80 mg by mouth daily.  . carvedilol (COREG) 3.125 MG tablet Take 3.125 mg by mouth 2 (two) times daily with a meal.  . Fluticasone-Salmeterol (ADVAIR DISKUS) 250-50 MCG/DOSE AEPB Inhale 1 puff into the lungs 2 (two) times daily.  . furosemide (LASIX) 20 MG tablet Take 1 tablet (20 mg total) by mouth daily.  Marland Kitchen ipratropium-albuterol (DUONEB) 0.5-2.5 (3) MG/3ML SOLN Take 3 mLs by nebulization every 4 (four) hours as needed.  . metFORMIN (GLUCOPHAGE) 1000 MG tablet Take 1 tablet by mouth 2 (two) times daily.  . nitroGLYCERIN (NITROSTAT) 0.4 MG SL tablet Place 0.4 mg under the tongue every 5 (five) minutes as  needed for chest pain.  . sacubitril-valsartan (ENTRESTO) 49-51 MG Take 1 tablet by mouth 2 (two) times daily.    Allergies: Patient has no known allergies.  Social History   Tobacco Use  . Smoking status: Current Some Day Smoker    Packs/day: 0.25    Years: 50.00    Pack years: 12.50  . Smokeless tobacco: Never Used  Substance Use Topics  . Alcohol use: No  . Drug use: No    Family History  Problem Relation Age  of Onset  . Diabetes Mother   . Diabetes Father     Review of Systems: A 12-system review of systems was performed and was negative except as noted in the HPI.  --------------------------------------------------------------------------------------------------  Physical Exam: BP 124/64 (BP Location: Left Arm, Patient Position: Sitting, Cuff Size: Normal)   Pulse 63   Ht 5' 8.5" (1.74 m)   Wt 216 lb 8 oz (98.2 kg)   BMI 32.44 kg/m   General: NAD. HEENT: No conjunctival pallor or scleral icterus. Moist mucous membranes.  OP clear. Neck: Supple without lymphadenopathy, thyromegaly, JVD, or HJR. Lungs: Normal work of breathing. Clear to auscultation bilaterally without wheezes or crackles. Heart: Regular rate and rhythm without murmurs, rubs, or gallops. Abd: Bowel sounds present. Soft, NT/ND without hepatosplenomegaly Ext: No lower extremity edema. Radial, PT, and DP pulses are 2+ bilaterally. Skin: Warm and dry without rash.  EKG: Normal sinus rhythm with isolated PVC and inferior T wave inversions.  Lab Results  Component Value Date   WBC 7.2 12/01/2017   HGB 13.7 12/01/2017   HCT 41.1 12/01/2017   MCV 94.1 12/01/2017   PLT 211 12/01/2017    Lab Results  Component Value Date   NA 132 (L) 12/01/2017   K 4.2 12/01/2017   CL 98 (L) 12/01/2017   CO2 25 12/01/2017   BUN 72 (H) 12/01/2017   CREATININE 1.28 (H) 12/01/2017   GLUCOSE 266 (H) 12/01/2017    Lab Results  Component Value Date   CHOL 151 04/01/2016   HDL 46 04/01/2016   LDLCALC 99 04/01/2016   TRIG 363 (H) 11/27/2017   CHOLHDL 3.3 04/01/2016    --------------------------------------------------------------------------------------------------  ASSESSMENT AND PLAN: Chronic systolic heart failure secondary to nonischemic cardiomyopathy Mr. Cheyney appears euvolemic and well compensated with NYHA class I-II symptoms.  He is tolerating carvedilol and Entresto well.  Low baseline resting heart rate precludes up  titration of carvedilol.  We have agreed to add spironolactone 25 mg daily.  I will check a basic metabolic panel today and again in 1 week.  If he tolerates this, future escalation of Entresto could be considered.  Given minimal symptoms and nonischemic substrate, we will defer ICD evaluation.  Nonobstructive coronary artery disease No symptoms of angina.  Continue with medical therapy and primary prevention, including low-dose aspirin and atorvastatin.  Follow-up: Return to clinic in 3 months.  Yvonne Kendall, MD 05/16/2018 11:37 AM

## 2018-05-16 ENCOUNTER — Ambulatory Visit (INDEPENDENT_AMBULATORY_CARE_PROVIDER_SITE_OTHER): Payer: Medicare Other | Admitting: Internal Medicine

## 2018-05-16 VITALS — BP 124/64 | HR 63 | Ht 68.5 in | Wt 216.5 lb

## 2018-05-16 DIAGNOSIS — I5022 Chronic systolic (congestive) heart failure: Secondary | ICD-10-CM | POA: Diagnosis not present

## 2018-05-16 DIAGNOSIS — I10 Essential (primary) hypertension: Secondary | ICD-10-CM | POA: Diagnosis not present

## 2018-05-16 DIAGNOSIS — I2510 Atherosclerotic heart disease of native coronary artery without angina pectoris: Secondary | ICD-10-CM

## 2018-05-16 DIAGNOSIS — I255 Ischemic cardiomyopathy: Secondary | ICD-10-CM

## 2018-05-16 MED ORDER — SPIRONOLACTONE 25 MG PO TABS: 25.0000 mg | ORAL_TABLET | Freq: Every day | ORAL | 3 refills | Status: DC

## 2018-05-16 NOTE — Patient Instructions (Signed)
Medication Instructions:  Your physician has recommended you make the following change in your medication:  1- START Spironolactone 25 mg (1 tablet) by mouth once a day.   Labwork: Your physician recommends that you return for lab work in: TODAY - BMET.   Your physician recommends that you return for lab work in: 1 WEEK FOR BMET. - ON May 23, 2018. - Please go to the [facility]. You will check in at the front desk to the right as you walk into the atrium. Valet Parking is offered if needed.   Testing/Procedures: NONE   Follow-Up: Your physician recommends that you schedule a follow-up appointment in: 3 MONTHS WITH DR END OR APP.  If you need a refill on your cardiac medications before your next appointment, please call your pharmacy.

## 2018-05-17 ENCOUNTER — Encounter: Payer: Self-pay | Admitting: Internal Medicine

## 2018-05-17 DIAGNOSIS — I2510 Atherosclerotic heart disease of native coronary artery without angina pectoris: Secondary | ICD-10-CM | POA: Insufficient documentation

## 2018-05-17 LAB — BASIC METABOLIC PANEL
BUN/Creatinine Ratio: 22 (ref 10–24)
BUN: 26 mg/dL (ref 8–27)
CO2: 24 mmol/L (ref 20–29)
Calcium: 9.5 mg/dL (ref 8.6–10.2)
Chloride: 103 mmol/L (ref 96–106)
Creatinine, Ser: 1.2 mg/dL (ref 0.76–1.27)
GFR calc Af Amer: 72 mL/min/{1.73_m2} (ref >59)
GFR calc non Af Amer: 63 mL/min/{1.73_m2} (ref >59)
Glucose: 122 mg/dL — ABNORMAL HIGH (ref 65–99)
Potassium: 4.8 mmol/L (ref 3.5–5.2)
Sodium: 141 mmol/L (ref 134–144)

## 2018-05-21 ENCOUNTER — Telehealth: Payer: Self-pay | Admitting: *Deleted

## 2018-05-21 NOTE — Telephone Encounter (Signed)
-----   Message from Yvonne Kendall, MD sent at 05/17/2018  7:17 AM EDT ----- Labs normal.  Okay to start spironolactone and recheck BMP in 1 week.

## 2018-05-21 NOTE — Telephone Encounter (Signed)
Results called to pt. Pt verbalized understanding of results and to get lab work on October 2 at the [facility] as planned when patient was at last appointment.

## 2018-05-22 DIAGNOSIS — Z7984 Long term (current) use of oral hypoglycemic drugs: Secondary | ICD-10-CM | POA: Diagnosis not present

## 2018-05-22 DIAGNOSIS — F17200 Nicotine dependence, unspecified, uncomplicated: Secondary | ICD-10-CM | POA: Diagnosis not present

## 2018-05-22 DIAGNOSIS — Z79899 Other long term (current) drug therapy: Secondary | ICD-10-CM | POA: Diagnosis not present

## 2018-05-22 DIAGNOSIS — Z7982 Long term (current) use of aspirin: Secondary | ICD-10-CM | POA: Diagnosis not present

## 2018-05-22 DIAGNOSIS — R05 Cough: Secondary | ICD-10-CM | POA: Diagnosis not present

## 2018-05-23 ENCOUNTER — Other Ambulatory Visit
Admission: RE | Admit: 2018-05-23 | Discharge: 2018-05-23 | Disposition: A | Discharge status: Home / Self Care | Payer: Medicare Other | Source: Ambulatory Visit | Attending: Internal Medicine | Admitting: Internal Medicine

## 2018-05-23 DIAGNOSIS — I5022 Chronic systolic (congestive) heart failure: Secondary | ICD-10-CM | POA: Insufficient documentation

## 2018-05-23 DIAGNOSIS — I255 Ischemic cardiomyopathy: Secondary | ICD-10-CM | POA: Diagnosis not present

## 2018-05-23 LAB — BASIC METABOLIC PANEL
Anion gap: 8 (ref 5–15)
BUN: 21 mg/dL (ref 8–23)
CO2: 27 mmol/L (ref 22–32)
Calcium: 9.1 mg/dL (ref 8.9–10.3)
Chloride: 105 mmol/L (ref 98–111)
Creatinine, Ser: 1.12 mg/dL (ref 0.61–1.24)
GFR calc Af Amer: >60 mL/min (ref >60)
GFR calc non Af Amer: >60 mL/min (ref >60)
Glucose, Bld: 97 mg/dL (ref 70–99)
Potassium: 4.1 mmol/L (ref 3.5–5.1)
Sodium: 140 mmol/L (ref 135–145)

## 2018-08-07 NOTE — Progress Notes (Deleted)
Follow-up Outpatient Visit Date: 08/10/2018  Primary Care Provider: Toy Cookey, FNP [address] [place] [address]  Chief Complaint: ***  HPI:  Jorge Cruz is a 67 y.o. year-old male with history of history of chronic systolic and diastolic heart failure secondary to nonischemic cardiomyopathy,nonobstructive coronary artery disease, hypertension, diabetes mellitus, chronic kidney disease stage III, COPD, polysubstance abuse, and medication noncompliance, who presents for follow-up of heart failure.  I last saw [name] in September, at which time he was doing well.  We agreed to start spironolactone 25 mg daily to optimize evidence-based heart failure therapy.  --------------------------------------------------------------------------------------------------  Past Medical History:  Diagnosis Date  . Chronic combined systolic (congestive) and diastolic (congestive) heart failure (HCC)    a. 03/2014 Echo: EF 45%, glob HK; b. 03/2016 Echo: EF 20-25%, diff HK, Gr1 DD, mild MR; b. 11/2017 Echo: EF 20%, diff HK, Gr2 DD, mildly to mod reduced RV fxn, PASP .  . CKD (chronic kidney disease), stage III (HCC)   . COPD (chronic obstructive pulmonary disease) (HCC)   . Diabetes mellitus without complication (HCC)   . Hypertensive heart disease   . NICM (nonischemic cardiomyopathy) (HCC)    a. 03/2014 Echo: EF 45%, glob HK, mild conc LVH; b. 03/2014 MV: possible mild ischemia, superimposed on small inf infarct, EF 36%; c. 03/2016 MV: EF <30%, no ischemia; d. 03/2016 Echo: EF 20-25%, diff HK, Gr1 DD, mild MR; e. 05/2017 Cath: nonobs dzs, EF 20%; f. 11/2017 Echo: EF 20%, diff HK, Gr2 DD.  . Non-obstructive CAD (coronary artery disease)    a. 05/2017 Cath: LM nl, LAD 62m, LCX 68m, RCA 77m, EF 20%.  . Polysubstance abuse (HCC)   . Tobacco abuse    Past Surgical History:  Procedure Laterality Date  . RIGHT/LEFT HEART CATH AND CORONARY ANGIOGRAPHY N/A 06/01/2017   Procedure: RIGHT/LEFT HEART  CATH AND CORONARY ANGIOGRAPHY;  Surgeon: Iran Ouch, MD;  Location: [facility] INVASIVE CV LAB;  Service: Cardiovascular;  Laterality: N/A;    No outpatient medications have been marked as taking for the 08/10/18 encounter (Appointment) with Jorge Cruz, [name], MD.    Allergies: Patient has no known allergies.  Social History   Tobacco Use  . Smoking status: Current Some Day Smoker    Packs/day: 0.25    Years: 50.00    Pack years: 12.50  . Smokeless tobacco: Never Used  Substance Use Topics  . Alcohol use: No  . Drug use: No    Family History  Problem Relation Age of Onset  . Diabetes Mother   . Diabetes Father     Review of Systems: A 12-system review of systems was performed and was negative except as noted in the HPI.  --------------------------------------------------------------------------------------------------  Physical Exam: There were no vitals taken for this visit.  General:  *** HEENT: No conjunctival pallor or scleral icterus. Moist mucous membranes.  OP clear. Neck: Supple without lymphadenopathy, thyromegaly, JVD, or HJR. No carotid bruit. Lungs: Normal work of breathing. Clear to auscultation bilaterally without wheezes or crackles. Heart: Regular rate and rhythm without murmurs, rubs, or gallops. Non-displaced PMI. Abd: Bowel sounds present. Soft, NT/ND without hepatosplenomegaly Ext: No lower extremity edema. Radial, PT, and DP pulses are 2+ bilaterally. Skin: Warm and dry without rash.  EKG:  ***  Lab Results  Component Value Date   WBC 7.2 12/01/2017   HGB 13.7 12/01/2017   HCT 41.1 12/01/2017   MCV 94.1 12/01/2017   PLT 211 12/01/2017    Lab Results  Component Value Date   NA 140 05/23/2018   K 4.1 05/23/2018   CL 105 05/23/2018   CO2 27 05/23/2018   BUN 21 05/23/2018   CREATININE 1.12 05/23/2018   GLUCOSE 97 05/23/2018    Lab Results  Component Value Date   CHOL 151 04/01/2016   HDL 46 04/01/2016   LDLCALC 99 04/01/2016    TRIG 363 (H) 11/27/2017   CHOLHDL 3.3 04/01/2016    --------------------------------------------------------------------------------------------------  ASSESSMENT AND PLAN: Yvonne Kendall, MD 08/07/2018 9:14 PM

## 2018-08-10 ENCOUNTER — Telehealth: Payer: Self-pay | Admitting: Internal Medicine

## 2018-08-10 ENCOUNTER — Ambulatory Visit: Payer: Medicare Other | Admitting: Internal Medicine

## 2018-08-10 NOTE — Telephone Encounter (Signed)
Patient wants a referral from Dr. Okey Dupre to a local card provider in [place] as he struggling with Visits to see him in [place]

## 2018-08-10 NOTE — Telephone Encounter (Signed)
No answer. Left message to call back.   

## 2018-08-13 NOTE — Telephone Encounter (Signed)
Attempted to reach patient. No answer and mailbox is full. Closing encounter.

## 2018-09-09 DIAGNOSIS — J9622 Acute and chronic respiratory failure with hypercapnia: Secondary | ICD-10-CM | POA: Diagnosis present

## 2018-09-09 DIAGNOSIS — I2510 Atherosclerotic heart disease of native coronary artery without angina pectoris: Secondary | ICD-10-CM | POA: Diagnosis present

## 2018-09-09 DIAGNOSIS — I959 Hypotension, unspecified: Secondary | ICD-10-CM | POA: Diagnosis not present

## 2018-09-09 DIAGNOSIS — E1165 Type 2 diabetes mellitus with hyperglycemia: Secondary | ICD-10-CM | POA: Diagnosis present

## 2018-09-09 DIAGNOSIS — I5020 Unspecified systolic (congestive) heart failure: Secondary | ICD-10-CM | POA: Insufficient documentation

## 2018-09-09 DIAGNOSIS — N183 Chronic kidney disease, stage 3 (moderate): Secondary | ICD-10-CM | POA: Diagnosis not present

## 2018-09-09 DIAGNOSIS — R918 Other nonspecific abnormal finding of lung field: Secondary | ICD-10-CM | POA: Diagnosis not present

## 2018-09-09 DIAGNOSIS — R402432 Glasgow coma scale score 3-8, at arrival to emergency department: Secondary | ICD-10-CM | POA: Diagnosis present

## 2018-09-09 DIAGNOSIS — J101 Influenza due to other identified influenza virus with other respiratory manifestations: Secondary | ICD-10-CM | POA: Diagnosis not present

## 2018-09-09 DIAGNOSIS — R0681 Apnea, not elsewhere classified: Secondary | ICD-10-CM | POA: Diagnosis not present

## 2018-09-09 DIAGNOSIS — E872 Acidosis: Secondary | ICD-10-CM | POA: Diagnosis not present

## 2018-09-09 DIAGNOSIS — J9621 Acute and chronic respiratory failure with hypoxia: Secondary | ICD-10-CM | POA: Diagnosis present

## 2018-09-09 DIAGNOSIS — I130 Hypertensive heart and chronic kidney disease with heart failure and stage 1 through stage 4 chronic kidney disease, or unspecified chronic kidney disease: Secondary | ICD-10-CM | POA: Diagnosis not present

## 2018-09-09 DIAGNOSIS — J811 Chronic pulmonary edema: Secondary | ICD-10-CM | POA: Diagnosis not present

## 2018-09-09 DIAGNOSIS — R000 Tachycardia, unspecified: Secondary | ICD-10-CM | POA: Diagnosis not present

## 2018-09-09 DIAGNOSIS — R740 Nonspecific elevation of levels of transaminase and lactic acid dehydrogenase [LDH]: Secondary | ICD-10-CM | POA: Diagnosis not present

## 2018-09-09 DIAGNOSIS — I10 Essential (primary) hypertension: Secondary | ICD-10-CM | POA: Diagnosis not present

## 2018-09-09 DIAGNOSIS — J9601 Acute respiratory failure with hypoxia: Secondary | ICD-10-CM | POA: Diagnosis not present

## 2018-09-09 DIAGNOSIS — F17210 Nicotine dependence, cigarettes, uncomplicated: Secondary | ICD-10-CM | POA: Diagnosis not present

## 2018-09-09 DIAGNOSIS — G9340 Encephalopathy, unspecified: Secondary | ICD-10-CM | POA: Diagnosis not present

## 2018-09-09 DIAGNOSIS — J9602 Acute respiratory failure with hypercapnia: Secondary | ICD-10-CM | POA: Diagnosis not present

## 2018-09-09 DIAGNOSIS — J1000 Influenza due to other identified influenza virus with unspecified type of pneumonia: Secondary | ICD-10-CM | POA: Diagnosis not present

## 2018-09-09 DIAGNOSIS — F17200 Nicotine dependence, unspecified, uncomplicated: Secondary | ICD-10-CM | POA: Diagnosis present

## 2018-09-09 DIAGNOSIS — E785 Hyperlipidemia, unspecified: Secondary | ICD-10-CM | POA: Diagnosis not present

## 2018-09-09 DIAGNOSIS — Z720 Tobacco use: Secondary | ICD-10-CM | POA: Diagnosis not present

## 2018-09-09 DIAGNOSIS — Z794 Long term (current) use of insulin: Secondary | ICD-10-CM | POA: Diagnosis not present

## 2018-09-09 DIAGNOSIS — R0602 Shortness of breath: Secondary | ICD-10-CM | POA: Diagnosis not present

## 2018-09-09 DIAGNOSIS — I5043 Acute on chronic combined systolic (congestive) and diastolic (congestive) heart failure: Secondary | ICD-10-CM | POA: Diagnosis not present

## 2018-09-09 DIAGNOSIS — Z7982 Long term (current) use of aspirin: Secondary | ICD-10-CM | POA: Diagnosis not present

## 2018-09-09 DIAGNOSIS — E1122 Type 2 diabetes mellitus with diabetic chronic kidney disease: Secondary | ICD-10-CM | POA: Diagnosis present

## 2018-09-09 DIAGNOSIS — I5040 Unspecified combined systolic (congestive) and diastolic (congestive) heart failure: Secondary | ICD-10-CM | POA: Diagnosis not present

## 2018-09-09 DIAGNOSIS — I428 Other cardiomyopathies: Secondary | ICD-10-CM | POA: Diagnosis present

## 2018-09-09 DIAGNOSIS — J810 Acute pulmonary edema: Secondary | ICD-10-CM | POA: Diagnosis not present

## 2018-09-09 DIAGNOSIS — R1312 Dysphagia, oropharyngeal phase: Secondary | ICD-10-CM | POA: Diagnosis present

## 2018-09-09 DIAGNOSIS — N179 Acute kidney failure, unspecified: Secondary | ICD-10-CM | POA: Diagnosis not present

## 2018-09-09 DIAGNOSIS — J09X2 Influenza due to identified novel influenza A virus with other respiratory manifestations: Secondary | ICD-10-CM | POA: Diagnosis not present

## 2018-09-09 DIAGNOSIS — R7989 Other specified abnormal findings of blood chemistry: Secondary | ICD-10-CM | POA: Diagnosis not present

## 2018-09-09 DIAGNOSIS — E871 Hypo-osmolality and hyponatremia: Secondary | ICD-10-CM | POA: Diagnosis present

## 2018-10-09 NOTE — Progress Notes (Deleted)
Patient ID: Jorge Cruz, male    DOB: 1951-04-03, 68 y.o.   MRN: [id number]  HPI  Mr Chaudhry is a 68 y/o male with a history of DM, COPD, HTN, current tobacco use and chronic heart failure.  Echo report from 11/25/17 reviewed and showed an EF of 20-25% along with a mildly elevated PA pressure of 35 mm Hg. Echo report from 04/01/16 was reviewed and shows an EF of 20-25% along with mild MR. Cardiac catheterization was done 06/01/17 which showed an EF of 20% along with mild nonobstructive CAD. RHC also done which showed normal filling pressures, normal pulmonary pressure and moderately reduced cardiac output. Cardiac output was 3.82 with a cardiac index of 1.83.  Has not been admitted or been in the ED in the last 6 months.   He presents today for a follow-up visit with a chief complaint of  Past Medical History:  Diagnosis Date  . Chronic combined systolic (congestive) and diastolic (congestive) heart failure (HCC)    a. 03/2014 Echo: EF 45%, glob HK; b. 03/2016 Echo: EF 20-25%, diff HK, Gr1 DD, mild MR; b. 11/2017 Echo: EF 20%, diff HK, Gr2 DD, mildly to mod reduced RV fxn, PASP .  . CKD (chronic kidney disease), stage III (HCC)   . COPD (chronic obstructive pulmonary disease) (HCC)   . Diabetes mellitus without complication (HCC)   . Hypertensive heart disease   . NICM (nonischemic cardiomyopathy) (HCC)    a. 03/2014 Echo: EF 45%, glob HK, mild conc LVH; b. 03/2014 MV: possible mild ischemia, superimposed on small inf infarct, EF 36%; c. 03/2016 MV: EF <30%, no ischemia; d. 03/2016 Echo: EF 20-25%, diff HK, Gr1 DD, mild MR; e. 05/2017 Cath: nonobs dzs, EF 20%; f. 11/2017 Echo: EF 20%, diff HK, Gr2 DD.  . Non-obstructive CAD (coronary artery disease)    a. 05/2017 Cath: LM nl, LAD 56m, LCX 60m, RCA 39m, EF 20%.  . Polysubstance abuse (HCC)   . Tobacco abuse    Past Surgical History:  Procedure Laterality Date  . RIGHT/LEFT HEART CATH AND CORONARY ANGIOGRAPHY N/A 06/01/2017   Procedure:  RIGHT/LEFT HEART CATH AND CORONARY ANGIOGRAPHY;  Surgeon: Iran Ouch, MD;  Location: [facility] INVASIVE CV LAB;  Service: Cardiovascular;  Laterality: N/A;   Family History  Problem Relation Age of Onset  . Diabetes Mother   . Diabetes Father    Social History   Tobacco Use  . Smoking status: Current Some Day Smoker    Packs/day: 0.25    Years: 50.00    Pack years: 12.50  . Smokeless tobacco: Never Used  Substance Use Topics  . Alcohol use: No   No Known Allergies    Review of Systems  Constitutional: Negative for appetite change and fatigue.  HENT: Positive for rhinorrhea. Negative for congestion and sore throat.   Eyes: Negative.   Respiratory: Negative for cough, chest tightness and shortness of breath.   Cardiovascular: Positive for chest pain (on occasion). Negative for palpitations and leg swelling.  Gastrointestinal: Negative for abdominal distention and abdominal pain.  Endocrine: Negative.   Genitourinary: Negative.   Musculoskeletal: Positive for neck pain (at times). Negative for back pain.  Skin: Negative.   Allergic/Immunologic: Negative.   Neurological: Positive for light-headedness (on occasion). Negative for dizziness.  Hematological: Negative for adenopathy. Does not bruise/bleed easily.  Psychiatric/Behavioral: Negative for dysphoric mood and sleep disturbance (sleeping on 2 pillows). The patient is not nervous/anxious.      Physical Exam  Constitutional: He  is oriented to person, place, and time. He appears well-developed and well-nourished.  HENT:  Head: Normocephalic and atraumatic.  Neck: Normal range of motion. Neck supple. No JVD present.  Cardiovascular: Normal rate and regular rhythm.  Pulmonary/Chest: Effort normal. He has no wheezes. He has no rales.  Abdominal: Soft. He exhibits no distension. There is no abdominal tenderness.  Musculoskeletal:        General: No tenderness or edema.  Neurological: He is alert and oriented to person,  place, and time.  Skin: Skin is warm and dry.  Psychiatric: He has a normal mood and affect. His behavior is normal. Thought content normal.  Nursing note and vitals reviewed.   Assessment & Plan:  1: Chronic heart failure with reduced ejection fraction- - NYHA class I - euvolemic today - hasn't been weighing daily as his scales got boxed up with recent move. Encouraged to resume weighing and to call for an overnight weight gain of >2 pounds or a weekly weight gain of >5 pounds - weight  - not adding salt and has been reading food labels. Reviewed the importance of keeping sodium intake to 2000mg  daily.  - saw cardiology Brion Aliment) 01/17/18 - his work had declined so had to move in with his brother - BNP 11/29/17 was 539.0 - PharmD reconciled medications with the patient  2: HTN- - BP  - BP may not tolerate titration of entresto or carvedilol - BMP from 05/23/18 reviewed and shows sodium 140, potassium 4.1, creatinine 1.12 and GFR >60 - says that he saw his PCP at [facility] a few months ago  3: DM- - nonfasting glucose at home was  - A1c on 05/31/17 was 7.6%, down from 9.9% back in August 2017  4:Tobacco use-  - smoking <1 ppd of cigarettes; says that he's been smoking more due to the stress of having to move - complete cessation discussed for 3 minutes with the patient  Patient did not bring his medications nor a list. Each medication was verbally reviewed with the patient and he was encouraged to bring the bottles to every visit to confirm accuracy of list.

## 2018-10-10 ENCOUNTER — Telehealth: Payer: Self-pay | Admitting: Family

## 2018-10-10 ENCOUNTER — Ambulatory Visit: Payer: Medicare Other | Admitting: Family

## 2018-10-10 NOTE — Telephone Encounter (Signed)
Patient did not show for his [facility] appointment on 10/10/2018. Will attempt to reschedule.  

## 2018-10-15 ENCOUNTER — Ambulatory Visit: Payer: Medicare Other | Admitting: Family

## 2018-10-15 DIAGNOSIS — I509 Heart failure, unspecified: Secondary | ICD-10-CM | POA: Diagnosis not present

## 2018-10-15 DIAGNOSIS — E1140 Type 2 diabetes mellitus with diabetic neuropathy, unspecified: Secondary | ICD-10-CM | POA: Diagnosis not present

## 2018-10-15 DIAGNOSIS — I10 Essential (primary) hypertension: Secondary | ICD-10-CM | POA: Diagnosis not present

## 2018-11-21 ENCOUNTER — Telehealth: Payer: Self-pay | Admitting: Nurse Practitioner

## 2018-11-21 NOTE — Telephone Encounter (Signed)
New Message   Pt c/o medication issue:  1. Name of Medication: sacubitril-valsartan (ENTRESTO) 49-51 MG   2. How are you currently taking this medication (dosage and times per day)? Take 1 tablet by mouth 2 (two) times daily.  3. Are you having a reaction (difficulty breathing--STAT)?   4. What is your medication issue? A prior authorization is needed for medication. Pharmacy will be faxing over the request.

## 2018-11-22 NOTE — Telephone Encounter (Signed)
I spoke with pharmacist concerning pt's Entresto. PA attempted through covermymeds could not verify pt. Pharmacist was able to assist pt with his refills and pt is good on refills and doesn't need a PA at this time.

## 2018-11-23 ENCOUNTER — Telehealth: Payer: Self-pay | Admitting: Nurse Practitioner

## 2018-11-23 NOTE — Telephone Encounter (Signed)
Called pharmacy to verify patients insurance information for PA.  Pharmacist stated that she fixed the problem and there was no PA needed

## 2018-11-23 NOTE — Telephone Encounter (Signed)
REFERENCE KEY [id number] Please call regarding PA for Entresto.

## 2019-01-18 ENCOUNTER — Telehealth: Payer: Self-pay | Admitting: Internal Medicine

## 2019-01-18 NOTE — Telephone Encounter (Signed)
Patient unable to get transportation for samples at this time.    Patient wants to know if we can expedite PA for entresto as he has been out for so long.    Patient advised to call [employer] and discuss timeframe for approval and also call [facility] to get an emergency dose of entresto

## 2019-01-18 NOTE — Telephone Encounter (Signed)
Second time submitting pt's PA unable to identify pt's eligibility. I will have to return call to pharmacy.

## 2019-01-18 NOTE — Telephone Encounter (Signed)
Patient spouse calling States that patient's pharmacy says patient will need a PA for [facility] medication Patient's pharmacy is [facility] in [place], phone number [phone] Patient has been without medication for 10 days  Please advise

## 2019-01-18 NOTE — Telephone Encounter (Signed)
Could someone check if we have entresto samples available for this pt? Entresto 49-51 mg tablet BID.

## 2019-01-18 NOTE — Telephone Encounter (Signed)
I have spoke to pt to verify pt's insurance information due to unable to verify pt through covermymeds. I spoke with pharmacy to verify pt's insurance information and PA number not available. I returned pharmacy call to see if there is a glitch in there system or if they could check information on file due to unable to verify pt through covermymeds to process with PA.  Pt has had issues with receiving medication before due to PA needed but pharmacy not running pt's medication through insurance appropriately.   PA had to be authorized through covermymeds by pharmacy.

## 2019-01-18 NOTE — Telephone Encounter (Signed)
Pt's insurance through [place] [id number] Group [id number] Rx Bin# [id number] Rx Group: [phone]

## 2019-01-22 NOTE — Telephone Encounter (Signed)
P.A has been approved through cover my meds. °

## 2019-10-10 ENCOUNTER — Emergency Department: Payer: Medicare PPO

## 2019-10-10 ENCOUNTER — Inpatient Hospital Stay
Admission: EM | Admit: 2019-10-10 | Discharge: 2019-10-13 | DRG: 208 | Disposition: A | Discharge status: Home / Self Care | Payer: Medicare PPO | Attending: Internal Medicine | Admitting: Internal Medicine

## 2019-10-10 ENCOUNTER — Other Ambulatory Visit: Payer: Self-pay

## 2019-10-10 ENCOUNTER — Encounter: Payer: Self-pay | Admitting: Emergency Medicine

## 2019-10-10 DIAGNOSIS — Z91128 Patient's intentional underdosing of medication regimen for other reason: Secondary | ICD-10-CM

## 2019-10-10 DIAGNOSIS — Z7982 Long term (current) use of aspirin: Secondary | ICD-10-CM | POA: Diagnosis not present

## 2019-10-10 DIAGNOSIS — I130 Hypertensive heart and chronic kidney disease with heart failure and stage 1 through stage 4 chronic kidney disease, or unspecified chronic kidney disease: Secondary | ICD-10-CM | POA: Diagnosis present

## 2019-10-10 DIAGNOSIS — I5023 Acute on chronic systolic (congestive) heart failure: Secondary | ICD-10-CM | POA: Diagnosis not present

## 2019-10-10 DIAGNOSIS — F17210 Nicotine dependence, cigarettes, uncomplicated: Secondary | ICD-10-CM | POA: Diagnosis present

## 2019-10-10 DIAGNOSIS — E1122 Type 2 diabetes mellitus with diabetic chronic kidney disease: Secondary | ICD-10-CM | POA: Diagnosis present

## 2019-10-10 DIAGNOSIS — I509 Heart failure, unspecified: Secondary | ICD-10-CM

## 2019-10-10 DIAGNOSIS — Z7984 Long term (current) use of oral hypoglycemic drugs: Secondary | ICD-10-CM

## 2019-10-10 DIAGNOSIS — N179 Acute kidney failure, unspecified: Secondary | ICD-10-CM | POA: Diagnosis present

## 2019-10-10 DIAGNOSIS — J9601 Acute respiratory failure with hypoxia: Secondary | ICD-10-CM

## 2019-10-10 DIAGNOSIS — I2510 Atherosclerotic heart disease of native coronary artery without angina pectoris: Secondary | ICD-10-CM | POA: Diagnosis present

## 2019-10-10 DIAGNOSIS — I248 Other forms of acute ischemic heart disease: Secondary | ICD-10-CM | POA: Diagnosis present

## 2019-10-10 DIAGNOSIS — Z9111 Patient's noncompliance with dietary regimen: Secondary | ICD-10-CM

## 2019-10-10 DIAGNOSIS — I161 Hypertensive emergency: Secondary | ICD-10-CM | POA: Diagnosis present

## 2019-10-10 DIAGNOSIS — I5043 Acute on chronic combined systolic (congestive) and diastolic (congestive) heart failure: Secondary | ICD-10-CM

## 2019-10-10 DIAGNOSIS — J441 Chronic obstructive pulmonary disease with (acute) exacerbation: Secondary | ICD-10-CM | POA: Diagnosis present

## 2019-10-10 DIAGNOSIS — I5042 Chronic combined systolic (congestive) and diastolic (congestive) heart failure: Secondary | ICD-10-CM | POA: Diagnosis present

## 2019-10-10 DIAGNOSIS — N1832 Chronic kidney disease, stage 3b: Secondary | ICD-10-CM | POA: Diagnosis present

## 2019-10-10 DIAGNOSIS — Z79899 Other long term (current) drug therapy: Secondary | ICD-10-CM | POA: Diagnosis not present

## 2019-10-10 DIAGNOSIS — G9340 Encephalopathy, unspecified: Secondary | ICD-10-CM | POA: Diagnosis present

## 2019-10-10 DIAGNOSIS — I428 Other cardiomyopathies: Secondary | ICD-10-CM | POA: Diagnosis present

## 2019-10-10 DIAGNOSIS — J9620 Acute and chronic respiratory failure, unspecified whether with hypoxia or hypercapnia: Secondary | ICD-10-CM | POA: Diagnosis present

## 2019-10-10 DIAGNOSIS — E785 Hyperlipidemia, unspecified: Secondary | ICD-10-CM | POA: Diagnosis present

## 2019-10-10 DIAGNOSIS — J9621 Acute and chronic respiratory failure with hypoxia: Secondary | ICD-10-CM | POA: Diagnosis present

## 2019-10-10 DIAGNOSIS — Z9114 Patient's other noncompliance with medication regimen: Secondary | ICD-10-CM | POA: Diagnosis not present

## 2019-10-10 DIAGNOSIS — J9622 Acute and chronic respiratory failure with hypercapnia: Secondary | ICD-10-CM | POA: Diagnosis present

## 2019-10-10 DIAGNOSIS — T50916A Underdosing of multiple unspecified drugs, medicaments and biological substances, initial encounter: Secondary | ICD-10-CM | POA: Diagnosis present

## 2019-10-10 DIAGNOSIS — Z833 Family history of diabetes mellitus: Secondary | ICD-10-CM

## 2019-10-10 DIAGNOSIS — E1165 Type 2 diabetes mellitus with hyperglycemia: Secondary | ICD-10-CM

## 2019-10-10 DIAGNOSIS — I501 Left ventricular failure: Secondary | ICD-10-CM

## 2019-10-10 DIAGNOSIS — Z20822 Contact with and (suspected) exposure to covid-19: Secondary | ICD-10-CM | POA: Diagnosis present

## 2019-10-10 DIAGNOSIS — Z7951 Long term (current) use of inhaled steroids: Secondary | ICD-10-CM | POA: Diagnosis not present

## 2019-10-10 DIAGNOSIS — Y92009 Unspecified place in unspecified non-institutional (private) residence as the place of occurrence of the external cause: Secondary | ICD-10-CM

## 2019-10-10 DIAGNOSIS — J9602 Acute respiratory failure with hypercapnia: Secondary | ICD-10-CM

## 2019-10-10 DIAGNOSIS — I5022 Chronic systolic (congestive) heart failure: Secondary | ICD-10-CM | POA: Diagnosis present

## 2019-10-10 DIAGNOSIS — J449 Chronic obstructive pulmonary disease, unspecified: Secondary | ICD-10-CM | POA: Diagnosis present

## 2019-10-10 LAB — CBC WITH DIFFERENTIAL/PLATELET
Abs Immature Granulocytes: 0.02 10*3/uL (ref 0.00–0.07)
Basophils Absolute: 0.1 10*3/uL (ref 0.0–0.1)
Basophils Relative: 1 %
Eosinophils Absolute: 0.1 10*3/uL (ref 0.0–0.5)
Eosinophils Relative: 1 %
HCT: 46.9 % (ref 39.0–52.0)
Hemoglobin: 14.6 g/dL (ref 13.0–17.0)
Immature Granulocytes: 0 %
Lymphocytes Relative: 55 %
Lymphs Abs: 6.1 10*3/uL — ABNORMAL HIGH (ref 0.7–4.0)
MCH: 30.5 pg (ref 26.0–34.0)
MCHC: 31.1 g/dL (ref 30.0–36.0)
MCV: 98.1 fL (ref 80.0–100.0)
Monocytes Absolute: 1.1 10*3/uL — ABNORMAL HIGH (ref 0.1–1.0)
Monocytes Relative: 10 %
Neutro Abs: 3.7 10*3/uL (ref 1.7–7.7)
Neutrophils Relative %: 33 %
Platelets: 254 10*3/uL (ref 150–400)
RBC: 4.78 MIL/uL (ref 4.22–5.81)
RDW: 15 % (ref 11.5–15.5)
WBC: 11.1 10*3/uL — ABNORMAL HIGH (ref 4.0–10.5)
nRBC: 0 % (ref 0.0–0.2)

## 2019-10-10 LAB — BASIC METABOLIC PANEL
Anion gap: 9 (ref 5–15)
BUN: 26 mg/dL — ABNORMAL HIGH (ref 8–23)
CO2: 27 mmol/L (ref 22–32)
Calcium: 9.1 mg/dL (ref 8.9–10.3)
Chloride: 105 mmol/L (ref 98–111)
Creatinine, Ser: 1.77 mg/dL — ABNORMAL HIGH (ref 0.61–1.24)
GFR calc Af Amer: 45 mL/min — ABNORMAL LOW (ref >60)
GFR calc non Af Amer: 39 mL/min — ABNORMAL LOW (ref >60)
Glucose, Bld: 279 mg/dL — ABNORMAL HIGH (ref 70–99)
Potassium: 4.5 mmol/L (ref 3.5–5.1)
Sodium: 141 mmol/L (ref 135–145)

## 2019-10-10 LAB — BLOOD GAS, ARTERIAL
Acid-base deficit: 2 mmol/L (ref 0.0–2.0)
Bicarbonate: 27.7 mmol/L (ref 20.0–28.0)
FIO2: 100
MECHVT: 550 mL
O2 Saturation: 99.9 %
PEEP: 5 cmH2O
Patient temperature: 37
RATE: 22 resp/min
pCO2 arterial: 71 mmHg (ref 32.0–48.0)
pH, Arterial: 7.2 — ABNORMAL LOW (ref 7.350–7.450)
pO2, Arterial: 402 mmHg — ABNORMAL HIGH (ref 83.0–108.0)

## 2019-10-10 LAB — URINE DRUG SCREEN, QUALITATIVE (ARMC ONLY)
Amphetamines, Ur Screen: NOT DETECTED
Barbiturates, Ur Screen: NOT DETECTED
Benzodiazepine, Ur Scrn: NOT DETECTED
Cannabinoid 50 Ng, Ur ~~LOC~~: NOT DETECTED
Cocaine Metabolite,Ur ~~LOC~~: NOT DETECTED
MDMA (Ecstasy)Ur Screen: NOT DETECTED
Methadone Scn, Ur: NOT DETECTED
Opiate, Ur Screen: NOT DETECTED
Phencyclidine (PCP) Ur S: NOT DETECTED
Tricyclic, Ur Screen: NOT DETECTED

## 2019-10-10 LAB — GLUCOSE, CAPILLARY: Glucose-Capillary: 179 mg/dL — ABNORMAL HIGH (ref 70–99)

## 2019-10-10 LAB — BRAIN NATRIURETIC PEPTIDE: B Natriuretic Peptide: 582 pg/mL — ABNORMAL HIGH (ref 0.0–100.0)

## 2019-10-10 LAB — POC SARS CORONAVIRUS 2 AG: SARS Coronavirus 2 Ag: NEGATIVE

## 2019-10-10 LAB — TROPONIN I (HIGH SENSITIVITY): Troponin I (High Sensitivity): 105 ng/L (ref <18)

## 2019-10-10 MED ORDER — FUROSEMIDE 10 MG/ML IJ SOLN
40.0000 mg | Freq: Once | INTRAMUSCULAR | Status: AC
  Administered 2019-10-10: 40 mg via INTRAVENOUS
  Filled 2019-10-10: qty 4

## 2019-10-10 MED ORDER — ETOMIDATE 2 MG/ML IV SOLN
20.0000 mg | Freq: Once | INTRAVENOUS | Status: AC
  Administered 2019-10-10: 20 mg via INTRAVENOUS

## 2019-10-10 MED ORDER — PROPOFOL 1000 MG/100ML IV EMUL
INTRAVENOUS | Status: AC
  Filled 2019-10-10: qty 100

## 2019-10-10 MED ORDER — NITROGLYCERIN IN D5W 200-5 MCG/ML-% IV SOLN
0.0000 ug/min | INTRAVENOUS | Status: DC
  Administered 2019-10-10: 20 ug/min via INTRAVENOUS
  Filled 2019-10-10: qty 250

## 2019-10-10 MED ORDER — METHYLPREDNISOLONE SODIUM SUCC 125 MG IJ SOLR
125.0000 mg | Freq: Once | INTRAMUSCULAR | Status: AC
  Administered 2019-10-10: 125 mg via INTRAVENOUS
  Filled 2019-10-10: qty 2

## 2019-10-10 MED ORDER — CARVEDILOL 3.125 MG PO TABS
3.1250 mg | ORAL_TABLET | Freq: Two times a day (BID) | ORAL | Status: DC
  Administered 2019-10-10: 3.125 mg
  Filled 2019-10-10: qty 1

## 2019-10-10 MED ORDER — PROPOFOL 1000 MG/100ML IV EMUL
5.0000 ug/kg/min | INTRAVENOUS | Status: DC
  Administered 2019-10-10: 20 ug/kg/min via INTRAVENOUS
  Administered 2019-10-11: 25 ug/kg/min via INTRAVENOUS
  Filled 2019-10-10: qty 100

## 2019-10-10 MED ORDER — FENTANYL CITRATE (PF) 100 MCG/2ML IJ SOLN
25.0000 ug | INTRAMUSCULAR | Status: DC | PRN
  Administered 2019-10-11: 50 ug via INTRAVENOUS
  Administered 2019-10-11 (×2): 100 ug via INTRAVENOUS
  Filled 2019-10-10 (×2): qty 2

## 2019-10-10 MED ORDER — IPRATROPIUM-ALBUTEROL 0.5-2.5 (3) MG/3ML IN SOLN
3.0000 mL | Freq: Once | RESPIRATORY_TRACT | Status: AC
  Administered 2019-10-10: 3 mL via RESPIRATORY_TRACT
  Filled 2019-10-10: qty 3

## 2019-10-10 MED ORDER — FENTANYL CITRATE (PF) 100 MCG/2ML IJ SOLN
25.0000 ug | INTRAMUSCULAR | Status: DC | PRN
  Filled 2019-10-10: qty 2

## 2019-10-10 MED ORDER — ACETAMINOPHEN 325 MG PO TABS: 650.0000 mg | ORAL_TABLET | ORAL | Status: DC | PRN

## 2019-10-10 MED ORDER — CHLORHEXIDINE GLUCONATE CLOTH 2 % EX PADS: 6.0000 | MEDICATED_PAD | Freq: Every day | CUTANEOUS | Status: DC

## 2019-10-10 MED ORDER — INSULIN ASPART 100 UNIT/ML ~~LOC~~ SOLN
0.0000 [IU] | SUBCUTANEOUS | Status: DC
  Administered 2019-10-10 – 2019-10-11 (×2): 2 [IU] via SUBCUTANEOUS
  Administered 2019-10-11 (×2): 1 [IU] via SUBCUTANEOUS
  Administered 2019-10-11: 2 [IU] via SUBCUTANEOUS
  Administered 2019-10-11 – 2019-10-12 (×2): 1 [IU] via SUBCUTANEOUS
  Administered 2019-10-12: 2 [IU] via SUBCUTANEOUS
  Administered 2019-10-12: 5 [IU] via SUBCUTANEOUS
  Administered 2019-10-12: 1 [IU] via SUBCUTANEOUS
  Administered 2019-10-12: 3 [IU] via SUBCUTANEOUS
  Filled 2019-10-10 (×11): qty 1

## 2019-10-10 MED ORDER — IPRATROPIUM-ALBUTEROL 0.5-2.5 (3) MG/3ML IN SOLN: 3.0000 mL | Freq: Four times a day (QID) | RESPIRATORY_TRACT | Status: DC | PRN

## 2019-10-10 MED ORDER — CARVEDILOL 6.25 MG PO TABS: 3.1250 mg | ORAL_TABLET | Freq: Two times a day (BID) | ORAL | Status: DC

## 2019-10-10 MED ORDER — ENOXAPARIN SODIUM 40 MG/0.4ML ~~LOC~~ SOLN
40.0000 mg | SUBCUTANEOUS | Status: DC
  Administered 2019-10-11 – 2019-10-13 (×3): 40 mg via SUBCUTANEOUS
  Filled 2019-10-10 (×3): qty 0.4

## 2019-10-10 MED ORDER — BUDESONIDE 0.5 MG/2ML IN SUSP
0.5000 mg | Freq: Two times a day (BID) | RESPIRATORY_TRACT | Status: DC
  Administered 2019-10-11 – 2019-10-13 (×6): 0.5 mg via RESPIRATORY_TRACT
  Filled 2019-10-10 (×6): qty 2

## 2019-10-10 MED ORDER — IPRATROPIUM-ALBUTEROL 0.5-2.5 (3) MG/3ML IN SOLN
3.0000 mL | Freq: Four times a day (QID) | RESPIRATORY_TRACT | Status: DC
  Administered 2019-10-11 (×3): 3 mL via RESPIRATORY_TRACT
  Filled 2019-10-10 (×3): qty 3

## 2019-10-10 MED ORDER — HYDRALAZINE HCL 20 MG/ML IJ SOLN
10.0000 mg | INTRAMUSCULAR | Status: DC | PRN
  Administered 2019-10-12: 10 mg via INTRAVENOUS
  Filled 2019-10-10: qty 1

## 2019-10-10 MED ORDER — VECURONIUM BROMIDE 10 MG IV SOLR
10.0000 mg | Freq: Once | INTRAVENOUS | Status: AC
  Administered 2019-10-10: 10 mg via INTRAVENOUS
  Filled 2019-10-10: qty 10

## 2019-10-10 MED ORDER — LORAZEPAM 2 MG/ML IJ SOLN
INTRAMUSCULAR | Status: AC
  Filled 2019-10-10: qty 1

## 2019-10-10 MED ORDER — ATORVASTATIN CALCIUM 80 MG PO TABS
80.0000 mg | ORAL_TABLET | Freq: Every day | ORAL | Status: DC
  Administered 2019-10-11 – 2019-10-13 (×3): 80 mg via ORAL
  Filled 2019-10-10: qty 4
  Filled 2019-10-10: qty 1
  Filled 2019-10-10: qty 4

## 2019-10-10 MED ORDER — VECURONIUM BROMIDE 10 MG IV SOLR
10.0000 mg | Freq: Once | INTRAVENOUS | Status: AC
  Administered 2019-10-10: 10 mg via INTRAVENOUS

## 2019-10-10 MED ORDER — FAMOTIDINE IN NACL 20-0.9 MG/50ML-% IV SOLN
20.0000 mg | Freq: Two times a day (BID) | INTRAVENOUS | Status: DC
  Administered 2019-10-10 – 2019-10-11 (×3): 20 mg via INTRAVENOUS
  Filled 2019-10-10 (×3): qty 50

## 2019-10-10 MED ORDER — METHYLPREDNISOLONE SODIUM SUCC 40 MG IJ SOLR
40.0000 mg | Freq: Two times a day (BID) | INTRAMUSCULAR | Status: DC
  Administered 2019-10-11 – 2019-10-12 (×3): 40 mg via INTRAVENOUS
  Filled 2019-10-10 (×3): qty 1

## 2019-10-10 MED ORDER — LORAZEPAM 2 MG/ML IJ SOLN
1.0000 mg | Freq: Once | INTRAMUSCULAR | Status: AC
  Administered 2019-10-10: 2 mg via INTRAVENOUS

## 2019-10-10 MED ORDER — SUCCINYLCHOLINE CHLORIDE 20 MG/ML IJ SOLN
100.0000 mg | Freq: Once | INTRAMUSCULAR | Status: AC
  Administered 2019-10-10: 100 mg via INTRAVENOUS

## 2019-10-10 MED ORDER — ASPIRIN 81 MG PO CHEW
81.0000 mg | CHEWABLE_TABLET | Freq: Every day | ORAL | Status: DC
  Administered 2019-10-11 – 2019-10-13 (×3): 81 mg
  Filled 2019-10-10 (×3): qty 1

## 2019-10-10 NOTE — Progress Notes (Signed)
Pharmacy:  CCM protocol for antibiotic adjustment due to renal fxn.    Pharmacy will monitor and adjust as warranted.   Valrie Hart, PharmD Clinical Pharmacist

## 2019-10-10 NOTE — H&P (Signed)
Name: Jorge Cruz MRN: [id number] DOB: Dec 18, 1950    ADMISSION DATE:  10/10/2019 CONSULTATION DATE: 10/10/2019  REFERRING MD : Dr. Jimmye Norman   CHIEF COMPLAINT: Respiratory Distress   BRIEF PATIENT DESCRIPTION:  69 yo male admitted with acute encephalopathy and acute on chronic hypercapnic respiratory failure secondary to AECOPD and flash pulmonary edema in the setting of hypertensive emergency requiring mechanical intubation   SIGNIFICANT EVENTS/STUDIES:  02/18: Pt admitted with acute on chronic hypercapnic respiratory failure requiring mechanical intubation   HISTORY OF PRESENT ILLNESS:   This is a 69 yo male with a PMH of Tobacco Abuse, Polysubstance Abuse, Non-Obstructive CAD, Nonischemic Cardiomyopathy, COPD, Stage III CKD, and Chronic Combined Systolic/Diastolic CHF.  He [facility] to [facility] ER on 02/18 via EMS with respiratory distress, O2 sats 75% on RA.  En route to the ER EMS administered 3 inch nitropaste due to severe hypertension, albuterol treatment x1 dose, and duoneb x1 dose.  Upon arrival to the ER pt alert/disoriented, diaphoretic, and tachypneic with labored respirations.  Therefore, he required mechanical intubation.  Due to sbp 170's/dbp's 100's nitroglycerin gtt initiated.  Rapid COVID-19 negative, however CXR concerning for acute vital/atypical respiratory infection.  Lab results revealed glucose 279, BUN 26, creatinine 1.77, BNP 582.0, troponin 105, wbc 11.1, urine drug screen negative, and post intubation abg pH 7.20/pCO2 71/pO2 402/bicarb 27.7.  He received 40 mg iv lasix x1 dose, duoneb treatment, and 125 mg iv solumedrol.  PCCM team contact for ICU admission.    PAST MEDICAL HISTORY :   has a past medical history of Chronic combined systolic (congestive) and diastolic (congestive) heart failure ([place]), CKD (chronic kidney disease), stage III, COPD (chronic obstructive pulmonary disease) ([place]), Diabetes mellitus without complication ([place]), Hypertensive heart disease, NICM  (nonischemic cardiomyopathy) ([place]), Non-obstructive CAD (coronary artery disease), Polysubstance abuse ([place]), and Tobacco abuse.  has a past surgical history that includes RIGHT/LEFT HEART CATH AND CORONARY ANGIOGRAPHY (N/A, 06/01/2017). Prior to Admission medications   Medication Sig Start Date End Date Taking? Authorizing Provider  albuterol (PROVENTIL HFA;VENTOLIN HFA) 108 (90 Base) MCG/ACT inhaler Inhale 2 puffs into the lungs every 6 (six) hours as needed for wheezing or shortness of breath.    [provider]  aspirin EC 81 MG tablet Take 1 tablet (81 mg total) by mouth daily. 04/01/16   Fritzi Mandes, MD  atorvastatin (LIPITOR) 80 MG tablet Take 80 mg by mouth daily.    [provider]  carvedilol (COREG) 3.125 MG tablet Take 1 tablet (3.125 mg total) by mouth 2 (two) times daily. 12/13/17 03/13/18  End, Harrell Gave, MD  carvedilol (COREG) 3.125 MG tablet Take 3.125 mg by mouth 2 (two) times daily with a meal.    [provider]  Fluticasone-Salmeterol (ADVAIR DISKUS) 250-50 MCG/DOSE AEPB Inhale 1 puff into the lungs 2 (two) times daily. 12/01/17 12/01/18  Dustin Flock, MD  furosemide (LASIX) 20 MG tablet Take 1 tablet (20 mg total) by mouth daily. 05/03/16 05/30/18  End, Harrell Gave, MD  ipratropium-albuterol (DUONEB) 0.5-2.5 (3) MG/3ML SOLN Take 3 mLs by nebulization every 4 (four) hours as needed. 12/01/17   Dustin Flock, MD  metFORMIN (GLUCOPHAGE) 1000 MG tablet Take 1 tablet by mouth 2 (two) times daily.    [provider]  nitroGLYCERIN (NITROSTAT) 0.4 MG SL tablet Place 0.4 mg under the tongue every 5 (five) minutes as needed for chest pain.    [provider]  sacubitril-valsartan (ENTRESTO) 49-51 MG Take 1 tablet by mouth 2 (two) times daily. 01/31/18  Theora Gianotti, NP  spironolactone (ALDACTONE) 25 MG tablet Take 1 tablet (25 mg total) by mouth daily. 05/16/18 08/14/18  End, Harrell Gave, MD   No Known Allergies  FAMILY HISTORY:   family history includes Diabetes in his father and mother. SOCIAL HISTORY:  reports that he has been smoking. He has a 12.50 pack-year smoking history. He has never used smokeless tobacco. He reports that he does not drink alcohol or use drugs.  REVIEW OF SYSTEMS:   Unable to assess pt intubated   SUBJECTIVE:  Unable to assess pt intubated   VITAL SIGNS: Temp:  [97.5 F (36.4 C)-99.2 F (37.3 C)] 97.5 F (36.4 C) (02/18 2245) Pulse Rate:  [68-145] 68 (02/18 2246) Resp:  [16-27] 22 (02/18 2246) BP: (59-218)/(47-137) 165/77 (02/18 2246) SpO2:  [73 %-100 %] 100 % (02/18 2246) FiO2 (%):  [50 %-100 %] 50 % (02/18 2145) Weight:  [81.6 kg] 81.6 kg (02/18 2122)  PHYSICAL EXAMINATION: General: acutely ill appearing male, NAD mechanically intubated  Neuro: sedated, not following commands, PERRL  HEENT: supple, no JVD  Cardiovascular: nsr, rrr, no R/G  Lungs: diminished throughout, even, non labored  Abdomen: +BS x4, soft, non tender, non distended  Musculoskeletal: normal bulk and tone, no edema Skin: intact no rashes or lesions present   Recent Labs  Lab 10/10/19 2133  NA 141  K 4.5  CL 105  CO2 27  BUN 26*  CREATININE 1.77*  GLUCOSE 279*   Recent Labs  Lab 10/10/19 2133  HGB 14.6  HCT 46.9  WBC 11.1*  PLT 254   DG Abdomen 1 View  Result Date: 10/10/2019 CLINICAL DATA:  69 year old male found at nursing home in respiratory distress. Enteric tube placement. EXAM: ABDOMEN - 1 VIEW COMPARISON:  Portable chest from the same time. FINDINGS: Portable AP semi upright view at 2156 hours. Enteric tube courses in the midline into the upper abdomen. The tip is not identified. It remains over the midline at the level of the upper lumbar spine. Small volume of gastric air also near the midline. Stable visible lungs from the portable chest including coarse bilateral interstitial opacity. IMPRESSION: Enteric tube courses to the abdomen and remains in the midline, most likely in the  stomach although the tip is not included. Electronically Signed   By: Genevie Ann M.D.   On: 10/10/2019 22:26   DG Chest Port 1 View  Result Date: 10/10/2019 CLINICAL DATA:  69 year old male found at nursing home in respiratory distress. Intubated, enteric tube placement. EXAM: PORTABLE CHEST 1 VIEW COMPARISON:  Portable chest 12/08/2017 and earlier. FINDINGS: Portable AP views at 2151 hours. Endotracheal tube tip in good position between the level the clavicles and carina. Enteric tube courses to the abdomen, tip not included. Larger lung volumes. Cardiomegaly less apparent. Other mediastinal contours are within normal limits. No pneumothorax or pleural effusion, but there is widespread new coarse bilateral pulmonary interstitial opacity. No areas of consolidation identified. No acute osseous abnormality identified. IMPRESSION: 1. Endotracheal tube in good position. 2. New widespread bilateral pulmonary interstitial opacity. Consider acute viral/atypical respiratory infection. No pleural effusion. Electronically Signed   By: Genevie Ann M.D.   On: 10/10/2019 22:08    ASSESSMENT / PLAN:  Acute on chronic hypercapnic respiratory failure secondary to AECOPD and flash pulmonary edema in setting of hypertensive emergency Mechanical Intubation  Full vent support for now-vent settings reviewed and established SBT once all parameters met  VAP bundle implemented  IV steroids for now wean as tolerated  Scheduled and prn bronchodilator therapy   Hypertensive emergency  Acute on chronic combined systolic/diastolic CHF  Elevated troponin likely demand ischemia in setting of acute on chronic respiratory failure  Hx: Nonischemic Cardiomyopathy Continuous telemetry monitoring  Trend troponin's Echo pending  Continue outpatient carvedilol, aspirin, and atorvastatin  Prn hydralazine to maintain sbp <170 and/or dbp <100  Acute on chronic renal failure Trend BMP  Replace electrolytes as indicated  Monitor UOP   Avoid nephrotoxic medications   Type II Diabetes Mellitus  CBG's q4hrs SSI   Acute encephalopathy secondary to hypercapnia Hx: Polysubstance Abuse  Maintain RASS goal -1 to -2 Propofol gtt and prn fentanyl to maintain RASS goal  WUA daily  If mentation does not improve will order CT Head   Best Practice: VTE px: subq lovenox SUP px: iv pepcid  Diet: keep NPO for now if pt remains intubated over the next 24 hrs will start TF's  Marda Stalker, [place] Pager [phone] (please enter 7 digits) PCCM Consult Pager [phone] (please enter 7 digits)

## 2019-10-10 NOTE — ED Triage Notes (Signed)
Pt to ED via EMS from home called as critical patient c/o respiratory distress, found to be 75% RA, CO2 60, 30 RR, HR 151, BP 300/160.  EMS reports wheezing upper lobes and tight lower lobes, hx of HTN and CHF and has not taken medications for 2 weeks.  Given 3in nitro paste, 1 albuterol, 1 duoneb in en route.  Pt presents extremely diaphoretic, labored and tachypnic, alert but disoriented.  Dr. Mayford Knife at bedside upon arrival.

## 2019-10-10 NOTE — ED Notes (Signed)
Butch, RN bolus 10 mg mcg of Propofol from bag

## 2019-10-10 NOTE — ED Notes (Signed)
Butch, RN bolus 20 mcg Propofol from bag

## 2019-10-10 NOTE — ED Provider Notes (Addendum)
Department of Emergency Medicine   Chief Complaint: unresponsive, respiratory distress  Level V Caveat: Respiratory distress, poorly responsive  History of present illness: Patient arrived by EMS in acute respiratory distress.  Reportedly he had been markedly hypertension to 300 systolic, was hypoxic on a nonrebreather and marginally responsive.  No further information is available.   ROS: Unable to obtain, Level V caveat  Scheduled Meds: . furosemide  40 mg Intravenous Once   Continuous Infusions: . nitroGLYCERIN    . propofol    . propofol (DIPRIVAN) infusion 20 mcg/kg/min (10/10/19 2138)   PRN Meds:. Past Medical History:  Diagnosis Date  . Chronic combined systolic (congestive) and diastolic (congestive) heart failure (HCC)    a. 03/2014 Echo: EF 45%, glob HK; b. 03/2016 Echo: EF 20-25%, diff HK, Gr1 DD, mild MR; b. 11/2017 Echo: EF 20%, diff HK, Gr2 DD, mildly to mod reduced RV fxn, PASP .  . CKD (chronic kidney disease), stage III   . COPD (chronic obstructive pulmonary disease) (HCC)   . Diabetes mellitus without complication (HCC)   . Hypertensive heart disease   . NICM (nonischemic cardiomyopathy) (HCC)    a. 03/2014 Echo: EF 45%, glob HK, mild conc LVH; b. 03/2014 MV: possible mild ischemia, superimposed on small inf infarct, EF 36%; c. 03/2016 MV: EF <30%, no ischemia; d. 03/2016 Echo: EF 20-25%, diff HK, Gr1 DD, mild MR; e. 05/2017 Cath: nonobs dzs, EF 20%; f. 11/2017 Echo: EF 20%, diff HK, Gr2 DD.  . Non-obstructive CAD (coronary artery disease)    a. 05/2017 Cath: LM nl, LAD 21m, LCX 35m, RCA 31m, EF 20%.  . Polysubstance abuse (HCC)   . Tobacco abuse    Past Surgical History:  Procedure Laterality Date  . RIGHT/LEFT HEART CATH AND CORONARY ANGIOGRAPHY N/A 06/01/2017   Procedure: RIGHT/LEFT HEART CATH AND CORONARY ANGIOGRAPHY;  Surgeon: Iran Ouch, MD;  Location: [facility] INVASIVE CV [facility];  Service: Cardiovascular;  Laterality: N/A;   Social History    Socioeconomic History  . Marital status: Married    Spouse name: Not on file  . Number of children: Not on file  . Years of education: Not on file  . Highest education level: Not on file  Occupational History  . Not on file  Tobacco Use  . Smoking status: Current Some Day Smoker    Packs/day: 0.25    Years: 50.00    Pack years: 12.50  . Smokeless tobacco: Never Used  Substance and Sexual Activity  . Alcohol use: No  . Drug use: No  . Sexual activity: Not on file  Other Topics Concern  . Not on file  Social History Narrative  . Not on file   Social Determinants of Health   Financial Resource Strain:   . Difficulty of Paying Living Expenses: Not on file  Food Insecurity:   . Worried About Programme researcher, broadcasting/film/video in the Last Year: Not on file  . Ran Out of Food in the Last Year: Not on file  Transportation Needs:   . Lack of Transportation (Medical): Not on file  . Lack of Transportation (Non-Medical): Not on file  Physical Activity:   . Days of Exercise per Week: Not on file  . Minutes of Exercise per Session: Not on file  Stress:   . Feeling of Stress : Not on file  Social Connections:   . Frequency of Communication with Friends and Family: Not on file  . Frequency of Social Gatherings with Friends and Family:  Not on file  . Attends Religious Services: Not on file  . Active Member of Clubs or Organizations: Not on file  . Attends Archivist Meetings: Not on file  . Marital Status: Not on file  Intimate Partner Violence:   . Fear of Current or Ex-Partner: Not on file  . Emotionally Abused: Not on file  . Physically Abused: Not on file  . Sexually Abused: Not on file   No Known Allergies  Last set of Vital Signs (not current) Vitals:   10/10/19 2121  SpO2: (!) 78%     Labs Reviewed  CBC WITH DIFFERENTIAL/PLATELET - Abnormal; Notable for the following components:      Result Value   WBC 11.1 (*)    Lymphs Abs 6.1 (*)    Monocytes Absolute 1.1 (*)     All other components within normal limits  BASIC METABOLIC PANEL - Abnormal; Notable for the following components:   Glucose, Bld 279 (*)    BUN 26 (*)    Creatinine, Ser 1.77 (*)    GFR calc non Af Amer 39 (*)    GFR calc Af Amer 45 (*)    All other components within normal limits  BRAIN NATRIURETIC PEPTIDE - Abnormal; Notable for the following components:   B Natriuretic Peptide 582.0 (*)    All other components within normal limits  BLOOD GAS, ARTERIAL - Abnormal; Notable for the following components:   pH, Arterial 7.20 (*)    pCO2 arterial 71 (*)    pO2, Arterial 402 (*)    All other components within normal limits  TROPONIN I (HIGH SENSITIVITY) - Abnormal; Notable for the following components:   Troponin I (High Sensitivity) 105 (*)    All other components within normal limits  POC SARS CORONAVIRUS 2 AG  POC SARS CORONAVIRUS 2 AG -  ED   EKG: Interpreted by me, atrial tachycardia with rate of 145 bpm, repolarization abnormality, nonspecific ST segment changes, long QT, normal axis  Repeat EKG interpreted by me, sinus tachycardia the rate of 126 bpm, borderline PR interval, normal axis, long QT, artifact  Repeat EKG interpreted by me, sinus tachycardia the rate of 134 bpm, PACs, long QT, normal axis  Physical Exam  Gen: Poorly responsive, severe tachypnea Cardiovascular: Tachycardia, normal S1-S2 Resp: apneic. Breath sounds equal bilaterally, some wheezing with bilateral rales are noted Abd: nondistended, nontender, normal bowel sounds Neuro: Patient localizes to pain but is confused and not following commands HEENT: No blood in posterior pharynx, frothy sputum from his mouth is noted Neck: No crepitus, marked JVD is noted Musculoskeletal: No deformity  Skin: warm  Procedures  INTUBATION Performed by: Laurence Aly Required items: required blood products, implants, devices, and special equipment available Patient identity confirmed: provided demographic data  and hospital-assigned identification number Time out: Immediately prior to procedure a "time out" was called to verify the correct patient, procedure, equipment, support staff and site/side marked as required. Indications: Acute respiratory failure Intubation method: RSI Preoxygenation: 70% BVM Sedatives: 20 mg of etomidate Paralytic: 100 mg of succinylcholine Tube Size: 7.5 cuffed Post-procedure assessment: chest rise and ETCO2 monitor Breath sounds: equal and absent over the epigastrium Tube secured by Respiratory Therapy Patient tolerated the procedure well with no immediate complications.  OG tube placement OG tube was placed by me to 60 cm with return of gastric secretions.  It was placed on intermittent suction.  Extensive frothy gastric secretions.   CRITICAL CARE Performed by: Laurence Aly Total  critical care time: 30 Critical care time was exclusive of separately billable procedures and treating other patients. Critical care was necessary to treat or prevent imminent or life-threatening deterioration. Critical care was time spent personally by me on the following activities: development of treatment plan with patient and/or surrogate as well as nursing, discussions with consultants, evaluation of patient's response to treatment, examination of patient, obtaining history from patient or surrogate, ordering and performing treatments and interventions, ordering and review of laboratory studies, ordering and review of radiographic studies, pulse oximetry and re-evaluation of patient's condition.  Radiology: Chest x-ray, 1 view abdomen IMPRESSION: 1. Endotracheal tube in good position. 2. New widespread bilateral pulmonary interstitial opacity. Consider acute viral/atypical respiratory infection. No pleural effusion.  Medical Decision making  Patient presents with apparently acute decompensated CHF with respiratory failure, severe hypoxia and expected severe hypertension.   He underwent rapid sequence intubation as dictated above.  Meds were given by left external EJ IV placed by me.  Assessment and Plan  Acute respiratory failure with hypercarbia and hypoxemia, CHF, COPD exacerbation  Patient was intubated on arrival with altered mental status and severe respiratory distress with hypercarbia and hypoxia.  Likely combination of acute decompensated CHF and COPD.  We initially started him on a nitroglycerin drip as well as placed him on a propofol drip for sedation.  In addition we had a Foley catheter placed, we gave him IV Lasix.  Vital signs appear to be improving at this time.  I will discuss with the intensivist for admission.   Emily Filbert, MD 10/10/19 2214    Emily Filbert, MD 10/10/19 2217    Emily Filbert, MD 10/10/19 2225

## 2019-10-11 ENCOUNTER — Inpatient Hospital Stay
Admit: 2019-10-11 | Discharge: 2019-10-11 | Disposition: A | Discharge status: Home / Self Care | Payer: Medicare PPO | Attending: Critical Care Medicine | Admitting: Critical Care Medicine

## 2019-10-11 ENCOUNTER — Inpatient Hospital Stay: Payer: Medicare PPO

## 2019-10-11 DIAGNOSIS — I5023 Acute on chronic systolic (congestive) heart failure: Secondary | ICD-10-CM

## 2019-10-11 LAB — TROPONIN I (HIGH SENSITIVITY)
Troponin I (High Sensitivity): 106 ng/L (ref <18)
Troponin I (High Sensitivity): 472 ng/L (ref <18)
Troponin I (High Sensitivity): 424 ng/L (ref <18)
Troponin I (High Sensitivity): 410 ng/L (ref <18)

## 2019-10-11 LAB — BLOOD GAS, ARTERIAL
Acid-Base Excess: 2.5 mmol/L — ABNORMAL HIGH (ref 0.0–2.0)
Bicarbonate: 28.4 mmol/L — ABNORMAL HIGH (ref 20.0–28.0)
FIO2: 0.35
MECHVT: 550 mL
Mechanical Rate: 26
O2 Saturation: 96.3 %
PEEP: 5 cmH2O
Patient temperature: 37
RATE: 26 resp/min
pCO2 arterial: 48 mmHg (ref 32.0–48.0)
pH, Arterial: 7.38 (ref 7.350–7.450)
pO2, Arterial: 86 mmHg (ref 83.0–108.0)

## 2019-10-11 LAB — HIV ANTIBODY (ROUTINE TESTING W REFLEX): HIV Screen 4th Generation wRfx: NONREACTIVE

## 2019-10-11 LAB — RESPIRATORY PANEL BY RT PCR (FLU A&B, COVID)
Influenza A by PCR: NEGATIVE
Influenza B by PCR: NEGATIVE
SARS Coronavirus 2 by RT PCR: NEGATIVE

## 2019-10-11 LAB — BASIC METABOLIC PANEL
Anion gap: 7 (ref 5–15)
BUN: 27 mg/dL — ABNORMAL HIGH (ref 8–23)
CO2: 29 mmol/L (ref 22–32)
Calcium: 9.2 mg/dL (ref 8.9–10.3)
Chloride: 106 mmol/L (ref 98–111)
Creatinine, Ser: 1.84 mg/dL — ABNORMAL HIGH (ref 0.61–1.24)
GFR calc Af Amer: 43 mL/min — ABNORMAL LOW (ref >60)
GFR calc non Af Amer: 37 mL/min — ABNORMAL LOW (ref >60)
Glucose, Bld: 145 mg/dL — ABNORMAL HIGH (ref 70–99)
Potassium: 5 mmol/L (ref 3.5–5.1)
Sodium: 142 mmol/L (ref 135–145)

## 2019-10-11 LAB — HEMOGLOBIN A1C
Hgb A1c MFr Bld: 6.1 % — ABNORMAL HIGH (ref 4.8–5.6)
Mean Plasma Glucose: 128.37 mg/dL

## 2019-10-11 LAB — GLUCOSE, CAPILLARY
Glucose-Capillary: 108 mg/dL — ABNORMAL HIGH (ref 70–99)
Glucose-Capillary: 135 mg/dL — ABNORMAL HIGH (ref 70–99)
Glucose-Capillary: 154 mg/dL — ABNORMAL HIGH (ref 70–99)
Glucose-Capillary: 176 mg/dL — ABNORMAL HIGH (ref 70–99)
Glucose-Capillary: 146 mg/dL — ABNORMAL HIGH (ref 70–99)
Glucose-Capillary: 142 mg/dL — ABNORMAL HIGH (ref 70–99)

## 2019-10-11 LAB — HEPATIC FUNCTION PANEL
ALT: 34 U/L (ref 0–44)
AST: 34 U/L (ref 15–41)
Albumin: 4 g/dL (ref 3.5–5.0)
Alkaline Phosphatase: 61 U/L (ref 38–126)
Bilirubin, Direct: <0.1 mg/dL (ref 0.0–0.2)
Total Bilirubin: 0.7 mg/dL (ref 0.3–1.2)
Total Protein: 7.6 g/dL (ref 6.5–8.1)

## 2019-10-11 LAB — CBC
HCT: 44.1 % (ref 39.0–52.0)
Hemoglobin: 14 g/dL (ref 13.0–17.0)
MCH: 30.8 pg (ref 26.0–34.0)
MCHC: 31.7 g/dL (ref 30.0–36.0)
MCV: 96.9 fL (ref 80.0–100.0)
Platelets: 251 10*3/uL (ref 150–400)
RBC: 4.55 MIL/uL (ref 4.22–5.81)
RDW: 14.9 % (ref 11.5–15.5)
WBC: 6.1 10*3/uL (ref 4.0–10.5)
nRBC: 0 % (ref 0.0–0.2)

## 2019-10-11 LAB — ECHOCARDIOGRAM COMPLETE
Height: 70 in
Weight: 2880 oz

## 2019-10-11 LAB — MRSA PCR SCREENING: MRSA by PCR: NEGATIVE

## 2019-10-11 LAB — PROCALCITONIN: Procalcitonin: 0.72 ng/mL

## 2019-10-11 LAB — PHOSPHORUS
Phosphorus: 1.6 mg/dL — ABNORMAL LOW (ref 2.5–4.6)
Phosphorus: 4.8 mg/dL — ABNORMAL HIGH (ref 2.5–4.6)

## 2019-10-11 LAB — MAGNESIUM: Magnesium: 1.9 mg/dL (ref 1.7–2.4)

## 2019-10-11 MED ORDER — ADULT MULTIVITAMIN W/MINERALS CH
1.0000 | ORAL_TABLET | Freq: Every day | ORAL | Status: DC
  Administered 2019-10-11 – 2019-10-13 (×3): 1
  Filled 2019-10-11 (×3): qty 1

## 2019-10-11 MED ORDER — FOLIC ACID 5 MG/ML IJ SOLN: 1.0000 mg | Freq: Every day | INTRAMUSCULAR | Status: DC

## 2019-10-11 MED ORDER — CARVEDILOL 3.125 MG PO TABS
3.1250 mg | ORAL_TABLET | Freq: Two times a day (BID) | ORAL | Status: DC
  Administered 2019-10-12 – 2019-10-13 (×3): 3.125 mg
  Filled 2019-10-11 (×3): qty 1

## 2019-10-11 MED ORDER — ORAL CARE MOUTH RINSE
15.0000 mL | OROMUCOSAL | Status: DC
  Administered 2019-10-11 (×3): 15 mL via OROMUCOSAL

## 2019-10-11 MED ORDER — FUROSEMIDE 10 MG/ML IJ SOLN
40.0000 mg | Freq: Once | INTRAMUSCULAR | Status: AC
  Administered 2019-10-11: 40 mg via INTRAVENOUS
  Filled 2019-10-11: qty 4

## 2019-10-11 MED ORDER — THIAMINE HCL 100 MG PO TABS
100.0000 mg | ORAL_TABLET | Freq: Every day | ORAL | Status: DC
  Administered 2019-10-11 – 2019-10-13 (×3): 100 mg via ORAL
  Filled 2019-10-11 (×3): qty 1

## 2019-10-11 MED ORDER — CHLORHEXIDINE GLUCONATE 0.12% ORAL RINSE (MEDLINE KIT)
15.0000 mL | Freq: Two times a day (BID) | OROMUCOSAL | Status: DC
  Administered 2019-10-11: 15 mL via OROMUCOSAL

## 2019-10-11 MED ORDER — IPRATROPIUM-ALBUTEROL 0.5-2.5 (3) MG/3ML IN SOLN
3.0000 mL | Freq: Three times a day (TID) | RESPIRATORY_TRACT | Status: DC
  Administered 2019-10-11 – 2019-10-13 (×6): 3 mL via RESPIRATORY_TRACT
  Filled 2019-10-11 (×6): qty 3

## 2019-10-11 MED ORDER — THIAMINE HCL 100 MG/ML IJ SOLN: 100.0000 mg | Freq: Every day | INTRAMUSCULAR | Status: DC

## 2019-10-11 MED ORDER — FOLIC ACID 1 MG PO TABS
1.0000 mg | ORAL_TABLET | Freq: Every day | ORAL | Status: DC
  Administered 2019-10-11 – 2019-10-13 (×3): 1 mg via ORAL
  Filled 2019-10-11 (×3): qty 1

## 2019-10-11 MED ORDER — SODIUM PHOSPHATES 45 MMOLE/15ML IV SOLN
30.0000 mmol | Freq: Once | INTRAVENOUS | Status: AC
  Administered 2019-10-11: 30 mmol via INTRAVENOUS
  Filled 2019-10-11: qty 10

## 2019-10-11 MED ORDER — DEXMEDETOMIDINE HCL IN NACL 400 MCG/100ML IV SOLN
0.4000 ug/kg/h | INTRAVENOUS | Status: DC
  Administered 2019-10-11: 0.4 ug/kg/h via INTRAVENOUS
  Filled 2019-10-11: qty 100

## 2019-10-11 NOTE — Progress Notes (Signed)
eLink Physician-Brief Progress Note Patient Name: Jorge Cruz DOB: 05/17/1951 MRN: [id number]   Date of Service  10/11/2019  HPI/Events of Note  Patient admitted through the ED with acute on chronic respiratory failure requiring intubation and mechanical ventilation. Contributing factors to acute failure include COPD exacerbation, uncontrolled hypertension, acute pulmonary edema, an infectious etiology as contributing factor also needs to be ruled out.  eICU Interventions  New Patient Evaluation completed.        Thomasene Lot Warnell Rasnic 10/11/2019, 12:46 AM

## 2019-10-11 NOTE — Progress Notes (Signed)
Pt extubated per md order, placed on 2L [place], no signs of respiratory distress, will continue to monitor

## 2019-10-11 NOTE — Progress Notes (Addendum)
Pt intubated most of the day and weaned to room air. Extubated at 2:14 pm by RT O2 stats 96-100% room air.  40 of Lasix given at 0800 total urine output for the day is , Dr. Marcos Eke aware

## 2019-10-11 NOTE — Progress Notes (Signed)
Follow up - Critical Care Medicine Note  Patient Details:    Jorge Cruz is an 69 y.o. 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  Lines, Airways, Drains: Urethral Catheter Mimi, NT Temperature probe 16 Fr. (Active)  Indication for Insertion or Continuance of Catheter Therapy based on hourly urine output monitoring and documentation for critical condition (NOT STRICT I&O) 10/11/19 1628  Site Assessment Clean;Intact 10/11/19 1945  Catheter Maintenance Bag below level of bladder;Catheter secured;Drainage bag/tubing not touching floor;Insertion date on drainage bag;No dependent loops;Seal intact 10/11/19 1945  Collection Container Standard drainage bag 10/11/19 1945  Securement Method Securing device (Describe) 10/11/19 1945  Urinary Catheter Interventions (if applicable) Unclamped 10/11/19 1945  Output (mL) 525 mL 10/11/19 1741    Anti-infectives:  Anti-infectives (From admission, onward)   None      Microbiology: Results for orders placed or performed during the hospital encounter of 10/10/19  Respiratory Panel by RT PCR (Flu A&B, Covid) - Nasopharyngeal Swab     Status: None   Collection Time: 10/10/19  9:34 PM   Specimen: Nasopharyngeal Swab  Result Value Ref Range Status   SARS Coronavirus 2 by RT PCR NEGATIVE NEGATIVE Final    Comment: (NOTE) SARS-CoV-2 target nucleic acids are NOT DETECTED. The SARS-CoV-2 RNA is generally detectable in upper respiratoy specimens during the acute phase of infection. The lowest concentration of SARS-CoV-2 viral copies this assay can detect is 131 copies/mL. A negative result does not preclude SARS-Cov-2 infection and should not be used as the sole basis for treatment or other patient management decisions. A negative result may occur with  improper specimen  collection/handling, submission of specimen other than nasopharyngeal swab, presence of viral mutation(s) within the areas targeted by this assay, and inadequate number of viral copies (<131 copies/mL). A negative result must be combined with clinical observations, patient history, and epidemiological information. The expected result is Negative. Fact Sheet for Patients:  https://www.moore.com/ Fact Sheet for Healthcare Providers:  https://www.young.biz/ This test is not yet ap proved or cleared by the [place] FDA and  has been authorized for detection and/or diagnosis of SARS-CoV-2 by FDA under an Emergency Use Authorization (EUA). This EUA will remain  in effect (meaning this test can be used) for the duration of the COVID-19 declaration under Section 564(b)(1) of the Act, 21 U.S.C. section 360bbb-3(b)(1), unless the authorization is terminated or revoked sooner.    Influenza A by PCR NEGATIVE NEGATIVE Final   Influenza B by PCR NEGATIVE NEGATIVE Final    Comment: (NOTE) The Xpert Xpress SARS-CoV-2/FLU/RSV assay is intended as an aid in  the diagnosis of influenza from Nasopharyngeal swab specimens and  should not be used as a sole basis for treatment. Nasal washings and  aspirates are unacceptable for Xpert Xpress SARS-CoV-2/FLU/RSV  testing. Fact Sheet for Patients: https://www.moore.com/ Fact Sheet for Healthcare Providers: https://www.young.biz/ This test is not yet approved or cleared by the [place] FDA and  has been authorized for detection and/or diagnosis of SARS-CoV-2 by  FDA under an Emergency Use Authorization (EUA). This EUA will remain  in effect (meaning this test can be used) for the duration of the  Covid-19 declaration under Section 564(b)(1) of the Act, 21  U.S.C. section 360bbb-3(b)(1), unless the authorization is  terminated or revoked. Performed at [facility],  [address] [place], [place] [address]   MRSA  PCR Screening     Status: None   Collection Time: 10/10/19 11:50 PM   Specimen: Nasal Mucosa; Nasopharyngeal  Result Value Ref Range Status   MRSA by PCR NEGATIVE NEGATIVE Final    Comment:        The GeneXpert MRSA Assay (FDA approved for NASAL specimens only), is one component of a comprehensive MRSA colonization surveillance program. It is not intended to diagnose MRSA infection nor to guide or monitor treatment for MRSA infections. Performed at [facility], [address] [place], [place] [address]   Culture, respiratory (non-expectorated)     Status: None (Preliminary result)   Collection Time: 10/11/19  1:10 AM   Specimen: Tracheal Aspirate; Respiratory  Result Value Ref Range Status   Specimen Description   Final    TRACHEAL ASPIRATE Performed at [facility], [address] [place], [place] [address]    Special Requests   Final    NONE Performed at [facility], [address] [place], [place] [address]    Gram Stain   Final    RARE WBC PRESENT,BOTH PMN AND MONONUCLEAR ABUNDANT GRAM POSITIVE COCCI IN PAIRS IN CHAINS FEW GRAM NEGATIVE RODS Performed at [facility], [address], [place], [place] [address]    Culture PENDING  Incomplete   Report Status PENDING  Incomplete    Best Practice/Protocols:  VTE Prophylaxis: Lovenox (prophylaxtic dose) GI Prophylaxis: Antihistamine Continous Sedation  Events: 02/18: Pt admitted with acute on chronic hypercapnic respiratory failure requiring mechanical intubation  02/19: Pulmonary edema resolved, patient tolerating SBT, will proceed with extubation  Studies: DG Abdomen 1 View  Result Date: 10/10/2019 CLINICAL DATA:  69 year old male found at nursing home in respiratory distress. Enteric tube placement. EXAM: ABDOMEN - 1 VIEW COMPARISON:  Portable chest from the same time. FINDINGS: Portable AP semi upright view at 2156  hours. Enteric tube courses in the midline into the upper abdomen. The tip is not identified. It remains over the midline at the level of the upper lumbar spine. Small volume of gastric air also near the midline. Stable visible lungs from the portable chest including coarse bilateral interstitial opacity. IMPRESSION: Enteric tube courses to the abdomen and remains in the midline, most likely in the stomach although the tip is not included. Electronically Signed   By: Odessa Fleming M.D.   On: 10/10/2019 22:26   DG Chest Port 1 View  Result Date: 10/11/2019 CLINICAL DATA:  Acute on chronic respiratory failure. EXAM: PORTABLE CHEST 1 VIEW COMPARISON:  Radiograph yesterday. FINDINGS: Endotracheal tube tip 4.2 cm from the carina. Enteric tube tip below the diaphragm not included in the field of view. Mild cardiomegaly with unchanged mediastinal contours. Improving interstitial opacities and Kerley B-lines, likely improving pulmonary edema, mild residual at the bases. No pneumothorax. IMPRESSION: 1. Improving interstitial opacities from prior exam, favor improving pulmonary edema over atypical infection. 2. Stable cardiomegaly. 3. Stable support apparatus. Electronically Signed   By: Narda Rutherford M.D.   On: 10/11/2019 04:19   DG Chest Port 1 View  Result Date: 10/10/2019 CLINICAL DATA:  69 year old male found at nursing home in respiratory distress. Intubated, enteric tube placement. EXAM: PORTABLE CHEST 1 VIEW COMPARISON:  Portable chest 12/08/2017 and earlier. FINDINGS: Portable AP views at 2151 hours. Endotracheal tube tip in good position between the level the clavicles and carina. Enteric tube courses to the abdomen, tip not included. Larger lung volumes. Cardiomegaly less apparent. Other mediastinal contours are within normal limits. No  pneumothorax or pleural effusion, but there is widespread new coarse bilateral pulmonary interstitial opacity. No areas of consolidation identified. No acute osseous  abnormality identified. IMPRESSION: 1. Endotracheal tube in good position. 2. New widespread bilateral pulmonary interstitial opacity. Consider acute viral/atypical respiratory infection. No pleural effusion. Electronically Signed   By: Odessa Fleming M.D.   On: 10/10/2019 22:08   ECHOCARDIOGRAM COMPLETE  Result Date: 10/11/2019    ECHOCARDIOGRAM REPORT   Patient Name:   MEHDI MEYERHOFER Date of Exam: 10/11/2019 Medical Rec #:  [id number]   Height:       70.0 in Accession #:    [id number]  Weight:       180.0 lb Date of Birth:  03/11/1951   BSA:          2.00 m Patient Age:    68 years    BP:           93/61 mmHg Patient Gender: M           HR:           77 bpm. Exam Location:  [facility] Procedure: 2D Echo, Cardiac Doppler and Color Doppler Indications:     Acute respiratory insufficiency 518.82  History:         Patient has prior history of Echocardiogram examinations, most                  recent 11/25/2017. COPD; Risk Factors:Hypertension and Diabetes.                  NICM, polysubstance abuse, tobacco abuse.  Sonographer:     Cristela Blue RDCS (AE) Referring Phys:  [id number] Eugenie Norrie Diagnosing Phys: Alwyn Pea MD IMPRESSIONS  1. Left ventricular ejection fraction, by estimation, is 30 to 35%. The left ventricle has moderately decreased function. The left ventricle demonstrates global hypokinesis. The left ventricular internal cavity size was moderately dilated. Left ventricular diastolic parameters were normal.  2. Right ventricular systolic function is low normal. The right ventricular size is mildly enlarged. There is normal pulmonary artery systolic pressure.  3. The mitral valve is normal in structure and function. Trivial mitral valve regurgitation.  4. The aortic valve is normal in structure and function. Aortic valve regurgitation is trivial. FINDINGS  Left Ventricle: Left ventricular ejection fraction, by estimation, is 30 to 35%. The left ventricle has moderately decreased function. The left ventricle  demonstrates global hypokinesis. The left ventricular internal cavity size was moderately dilated. There is no left ventricular hypertrophy. Left ventricular diastolic parameters were normal. Right Ventricle: The right ventricular size is mildly enlarged. No increase in right ventricular wall thickness. Right ventricular systolic function is low normal. There is normal pulmonary artery systolic pressure. The tricuspid regurgitant velocity is 1.48 m/s, and with an assumed right atrial pressure of 10 mmHg, the estimated right ventricular systolic pressure is 18.8 mmHg. Left Atrium: Left atrial size was normal in size. Right Atrium: Right atrial size was normal in size. Pericardium: There is no evidence of pericardial effusion. Mitral Valve: The mitral valve is normal in structure and function. Trivial mitral valve regurgitation. Tricuspid Valve: The tricuspid valve is normal in structure. Tricuspid valve regurgitation is trivial. Aortic Valve: The aortic valve is normal in structure and function. Aortic valve regurgitation is trivial. Aortic valve mean gradient measures 3.0 mmHg. Aortic valve peak gradient measures 5.3 mmHg. Aortic valve area, by VTI measures 1.83 cm. Pulmonic Valve: The pulmonic valve was not assessed. Pulmonic valve regurgitation is not  visualized. Aorta: The aortic root is normal in size and structure. IAS/Shunts: No atrial level shunt detected by color flow Doppler.  LEFT VENTRICLE PLAX 2D LVIDd:         5.96 cm      Diastology LVIDs:         5.37 cm      LV e' lateral: 2.72 cm/s LV PW:         1.47 cm      LV e' medial:  4.24 cm/s LV IVS:        0.96 cm LVOT diam:     2.10 cm LV SV:         42.26 ml LV SV Index:   18.70 LVOT Area:     3.46 cm  LV Volumes (MOD) LV vol d, MOD A2C: 179.0 ml LV vol d, MOD A4C: 170.0 ml LV vol s, MOD A2C: 114.0 ml LV vol s, MOD A4C: 147.0 ml LV SV MOD A2C:     65.0 ml LV SV MOD A4C:     170.0 ml LV SV MOD BP:      40.8 ml RIGHT VENTRICLE RV Basal diam:  4.20 cm RV S  prime:     10.60 cm/s TAPSE (M-mode): 2.8 cm LEFT ATRIUM           Index       RIGHT ATRIUM           Index LA diam:      3.00 cm 1.50 cm/m  RA Area:     25.40 cm LA Vol (A2C): 26.3 ml 13.18 ml/m RA Volume:   86.60 ml  43.39 ml/m LA Vol (A4C): 34.3 ml 17.19 ml/m  AORTIC VALVE                   PULMONIC VALVE AV Area (Vmax):    1.56 cm    PV Vmax:        0.62 m/s AV Area (Vmean):   1.53 cm    PV Peak grad:   1.5 mmHg AV Area (VTI):     1.83 cm    RVOT Peak grad: 2 mmHg AV Vmax:           115.00 cm/s AV Vmean:          80.600 cm/s AV VTI:            0.230 m AV Peak Grad:      5.3 mmHg AV Mean Grad:      3.0 mmHg LVOT Vmax:         51.70 cm/s LVOT Vmean:        35.500 cm/s LVOT VTI:          0.122 m LVOT/AV VTI ratio: 0.53  AORTA Ao Root diam: 2.70 cm TRICUSPID VALVE TR Peak grad:   8.8 mmHg TR Vmax:        148.00 cm/s  SHUNTS Systemic VTI:  0.12 m Systemic Diam: 2.10 cm Dwayne Salome Arnt MD Electronically signed by Alwyn Pea MD Signature Date/Time: 10/11/2019/3:10:19 PM    Final     Consults:    Subjective:    Overnight Issues: Diuresed well, SAT performed, follows commands.  Appears to be ready for extubation.  Objective:  Vital signs for last 24 hours: Temp:  [97.3 F (36.3 C)-99.2 F (37.3 C)] 97.7 F (36.5 C) (02/19 2000) Pulse Rate:  [67-145] 77 (02/19 2000) Resp:  [16-27] 16 (02/19 2000) BP: (59-218)/(47-137) 121/67 (02/19 2000) SpO2:  [  73 %-100 %] 91 % (02/19 2000) FiO2 (%):  [24 %-100 %] 24 % (02/19 1346) Weight:  [81.6 kg] 81.6 kg (02/18 2122)  Hemodynamic parameters for last 24 hours:    Intake/Output from previous day: 02/18 0701 - 02/19 0700 In: 121.4 [I.V.:71.4; IV Piggyback:50] Out: 1500 [Urine:1500]  Intake/Output this shift: No intake/output data recorded.  Vent settings for last 24 hours: Vent Mode: Spontaneous FiO2 (%):  [24 %-100 %] 24 % Set Rate:  [22 bmp-26 bmp] 26 bmp Vt Set:  [550 mL] 550 mL PEEP:  [5 cmH20] 5 cmH20 Pressure Support:  [5  cmH20] 5 cmH20 Plateau Pressure:  [18 cmH20-19 cmH20] 18 cmH20  Physical Exam:  General:  Lightly sedated, intubated mechanically ventilated. Neuro: sedated,following commands, PERRL  HEENT:  Conjunctiva pink, no scleral icterus.  Orotracheally intubated. Neck: Supple, no JVD, trachea midline.  Cardiovascular: nsr, rrr, no R/G  Lungs:  Coarse breath sounds, no other adventitious sounds.  Nonlabored. Abdomen: +BS x4, soft, non tender, non distended  Musculoskeletal: normal bulk and tone, no edema Skin: intact no rashes or lesions present   Assessment/Plan:  Acute on chronic hypercapnic respiratory failure secondary to AECOPD and flash pulmonary edema in setting of hypertensive emergency Mechanical Intubation  SBT today, anticipate extubation VAP bundle implemented  No indication for IV steroids, no bronchospasm, discontinue Scheduled and prn bronchodilator therapy, continue  Hypertensive emergency  Acute on chronic combined systolic/diastolic CHF  Elevated troponin likely demand ischemia in setting of acute on chronic respiratory failure  Hx: Nonischemic Cardiomyopathy Continuous telemetry monitoring  Trend troponin's Echo: LVEF 30 to 35%, global hypokinesis Continue outpatient carvedilol, aspirin, and atorvastatin  PRN hydralazine to maintain sbp <170 and/or dbp <100  Acute on chronic renal failure Trend BMP  Replace electrolytes as indicated  Monitor UOP  Avoid nephrotoxic medications   Type II Diabetes Mellitus  CBG's q4hrs SSI  Transition to standing dose insulin once extubated  Acute encephalopathy secondary to hypercapnia Hx: Polysubstance Abuse  Maintain RASS goal 0 during SBT for anticipated extubation Withhold propofol gtt and prn fentanyl anticipated extubation WUA daily  Mentation has improved, patient following commands.    LOS: 1 day   Additional comments: Discussed during multidisciplinary rounds.  Anticipate extubation today possible transfer to  regular medical ward tomorrow.  Critical Care Total Time*: 40 Minutes  C. Derrill Kay, MD [facility] 10/11/2019  *Care during the described time interval was provided by me and/or other providers on the critical care team.  I have reviewed this patient's available data, including medical history, events of note, physical examination and test results as part of my evaluation.

## 2019-10-11 NOTE — Progress Notes (Signed)
*  PRELIMINARY RESULTS* Echocardiogram 2D Echocardiogram has been performed.  Jorge Cruz 10/11/2019, 2:21 PM 

## 2019-10-11 NOTE — Progress Notes (Signed)
Initial Nutrition Assessment  DOCUMENTATION CODES:   Not applicable  INTERVENTION:   Once tube feeds initiated, recommend:  Vital 1.5 @40ml /hr + Prostat 30ml QID via tube   Free water flushes 20ml q4 hours to maintain tube patency   Regimen provides 1840kcal/day 125g/day protein and 974ml/day free water  MVI, thiamine and folic acid daily in setting of substance abuse  Pt likely at moderate refeed risk; recommend monitor K, Mg and P labs daily after tube feed initiation   NUTRITION DIAGNOSIS:   Inadequate oral intake related to inability to eat(pt sedated and ventilated) as evidenced by NPO status.  GOAL:   Provide needs based on ASPEN/SCCM guidelines  MONITOR:   Vent status, Labs, Weight trends, Skin, I & O's  REASON FOR ASSESSMENT:   Ventilator    ASSESSMENT:   69 yo male with a PMH of Tobacco Abuse, Polysubstance Abuse, Non-Obstructive CAD, Nonischemic Cardiomyopathy, COPD, Stage III CKD, and Chronic Combined Systolic/Diastolic CHF.  He presented to [facility] ER on 02/18 via EMS with respiratory distress and AMS requiring intubation   Pt sedated and ventilated. OGT in place. Suspect pt with decreased appetite and oral intake at baseline r/t substance abuse. Per chart, pt down 31lbs(15%) over the past 3 months if admit weight is correct; this would be significant. Pt likely at moderate refeed risk.    Medications reviewed and include: aspirin, lovenox, folic acid, insulin, solu-medrol, MVI, thiamine, precedex, pepcid   Labs reviewed: K 5.0 wnl, BUN 27(H), creat 1.84(H), P 4.8(H), Mg 1.9 wnl  Patient is currently intubated on ventilator support MV: 8.0 L/min Temp (24hrs), Avg:98 F (36.7 C), Min:97.3 F (36.3 C), Max:99.2 F (37.3 C)  Propofol: none   MAP- >18mmHg  UOP- 77m  NUTRITION - FOCUSED PHYSICAL EXAM:    Most Recent Value  Orbital Region  No depletion  Upper Arm Region  No depletion  Thoracic and Lumbar Region  No depletion  Buccal Region  No  depletion  Temple Region  Moderate depletion  Clavicle Bone Region  No depletion  Clavicle and Acromion Bone Region  No depletion  Scapular Bone Region  No depletion  Dorsal Hand  No depletion  Patellar Region  No depletion  Anterior Thigh Region  No depletion  Posterior Calf Region  No depletion  Edema (RD Assessment)  None  Hair  Reviewed  Eyes  Reviewed  Mouth  Reviewed  Skin  Reviewed  Nails  Reviewed     Diet Order:   Diet Order            Diet NPO time specified  Diet effective now             EDUCATION NEEDS:   No education needs have been identified at this time  Skin:  Skin Assessment: Reviewed RN Assessment  Last BM:  pta  Height:   Ht Readings from Last 1 Encounters:  10/10/19 5\' 10"  (1.778 m)    Weight:   Wt Readings from Last 1 Encounters:  10/10/19 81.6 kg    Ideal Body Weight:  75.4 kg  BMI:  Body mass index is 25.83 kg/m.  Estimated Nutritional Needs:   Kcal:  1800kcal/day  Protein:  110-130g/day  Fluid:  >1.9L/day  MS, RD, LDN Contact information available in Amion

## 2019-10-12 ENCOUNTER — Encounter: Payer: Self-pay | Admitting: Internal Medicine

## 2019-10-12 DIAGNOSIS — J9622 Acute and chronic respiratory failure with hypercapnia: Secondary | ICD-10-CM

## 2019-10-12 DIAGNOSIS — J441 Chronic obstructive pulmonary disease with (acute) exacerbation: Secondary | ICD-10-CM

## 2019-10-12 LAB — CBC WITH DIFFERENTIAL/PLATELET
Abs Immature Granulocytes: 0.04 10*3/uL (ref 0.00–0.07)
Basophils Absolute: 0 10*3/uL (ref 0.0–0.1)
Basophils Relative: 0 %
Eosinophils Absolute: 0 10*3/uL (ref 0.0–0.5)
Eosinophils Relative: 0 %
HCT: 39 % (ref 39.0–52.0)
Hemoglobin: 13.1 g/dL (ref 13.0–17.0)
Immature Granulocytes: 0 %
Lymphocytes Relative: 8 %
Lymphs Abs: 1 10*3/uL (ref 0.7–4.0)
MCH: 31.3 pg (ref 26.0–34.0)
MCHC: 33.6 g/dL (ref 30.0–36.0)
MCV: 93.3 fL (ref 80.0–100.0)
Monocytes Absolute: 0.8 10*3/uL (ref 0.1–1.0)
Monocytes Relative: 7 %
Neutro Abs: 9.5 10*3/uL — ABNORMAL HIGH (ref 1.7–7.7)
Neutrophils Relative %: 85 %
Platelets: 243 10*3/uL (ref 150–400)
RBC: 4.18 MIL/uL — ABNORMAL LOW (ref 4.22–5.81)
RDW: 15.2 % (ref 11.5–15.5)
WBC: 11.4 10*3/uL — ABNORMAL HIGH (ref 4.0–10.5)
nRBC: 0 % (ref 0.0–0.2)

## 2019-10-12 LAB — BASIC METABOLIC PANEL
Anion gap: 10 (ref 5–15)
BUN: 46 mg/dL — ABNORMAL HIGH (ref 8–23)
CO2: 24 mmol/L (ref 22–32)
Calcium: 8.8 mg/dL — ABNORMAL LOW (ref 8.9–10.3)
Chloride: 107 mmol/L (ref 98–111)
Creatinine, Ser: 1.58 mg/dL — ABNORMAL HIGH (ref 0.61–1.24)
GFR calc Af Amer: 51 mL/min — ABNORMAL LOW (ref >60)
GFR calc non Af Amer: 44 mL/min — ABNORMAL LOW (ref >60)
Glucose, Bld: 209 mg/dL — ABNORMAL HIGH (ref 70–99)
Potassium: 4.2 mmol/L (ref 3.5–5.1)
Sodium: 141 mmol/L (ref 135–145)

## 2019-10-12 LAB — GLUCOSE, CAPILLARY
Glucose-Capillary: 130 mg/dL — ABNORMAL HIGH (ref 70–99)
Glucose-Capillary: 139 mg/dL — ABNORMAL HIGH (ref 70–99)
Glucose-Capillary: 158 mg/dL — ABNORMAL HIGH (ref 70–99)
Glucose-Capillary: 188 mg/dL — ABNORMAL HIGH (ref 70–99)
Glucose-Capillary: 212 mg/dL — ABNORMAL HIGH (ref 70–99)
Glucose-Capillary: 258 mg/dL — ABNORMAL HIGH (ref 70–99)

## 2019-10-12 LAB — PHOSPHORUS: Phosphorus: 3.3 mg/dL (ref 2.5–4.6)

## 2019-10-12 LAB — MAGNESIUM: Magnesium: 1.9 mg/dL (ref 1.7–2.4)

## 2019-10-12 MED ORDER — INSULIN ASPART 100 UNIT/ML ~~LOC~~ SOLN: 0.0000 [IU] | Freq: Every day | SUBCUTANEOUS | Status: DC

## 2019-10-12 MED ORDER — FUROSEMIDE 10 MG/ML IJ SOLN: 40.0000 mg | Freq: Two times a day (BID) | INTRAMUSCULAR | Status: DC

## 2019-10-12 MED ORDER — FAMOTIDINE 20 MG PO TABS
20.0000 mg | ORAL_TABLET | Freq: Two times a day (BID) | ORAL | Status: DC
  Administered 2019-10-12: 20 mg via ORAL
  Filled 2019-10-12: qty 1

## 2019-10-12 MED ORDER — SACUBITRIL-VALSARTAN 49-51 MG PO TABS
1.0000 | ORAL_TABLET | Freq: Two times a day (BID) | ORAL | Status: DC
  Filled 2019-10-12: qty 1

## 2019-10-12 MED ORDER — FUROSEMIDE 10 MG/ML IJ SOLN
40.0000 mg | Freq: Every day | INTRAMUSCULAR | Status: DC
  Administered 2019-10-12 – 2019-10-13 (×2): 40 mg via INTRAVENOUS
  Filled 2019-10-12 (×2): qty 4

## 2019-10-12 MED ORDER — SACUBITRIL-VALSARTAN 49-51 MG PO TABS
1.0000 | ORAL_TABLET | Freq: Two times a day (BID) | ORAL | Status: DC
  Administered 2019-10-13: 1 via ORAL
  Filled 2019-10-12 (×2): qty 1

## 2019-10-12 MED ORDER — INSULIN ASPART 100 UNIT/ML ~~LOC~~ SOLN: 0.0000 [IU] | Freq: Three times a day (TID) | SUBCUTANEOUS | Status: DC

## 2019-10-12 MED ORDER — SPIRONOLACTONE 25 MG PO TABS
25.0000 mg | ORAL_TABLET | Freq: Every day | ORAL | Status: DC
  Administered 2019-10-12 – 2019-10-13 (×2): 25 mg via ORAL
  Filled 2019-10-12 (×2): qty 1

## 2019-10-12 NOTE — Progress Notes (Addendum)
Progress Note    Jorge Cruz  ZOX:[id number] DOB: Sep 27, 1950  DOA: 10/10/2019 PCP: Toy Cookey, FNP      Brief Narrative:    Medical records reviewed and are as summarized below:  Jorge Cruz is an 69 y.o. 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  He presented to [facility] ER on 02/18 via EMS with respiratory distress, O2 sats 75% on RA.  En route to the ER EMS administered 3 inch nitropaste due to severe hypertension, albuterol treatment x1 dose, and duoneb x1 dose.  Upon arrival to the ER pt alert/disoriented, diaphoretic, and tachypneic with labored respirations.  Therefore, he required mechanical intubation.  Due to sbp 170's/dbp's 100's nitroglycerin gtt initiated.        Assessment/Plan:   Principal Problem:   Acute on chronic respiratory failure (HCC) Active Problems:   COPD exacerbation (HCC)   Acute on chronic combined systolic and diastolic CHF (congestive heart failure) (HCC)  Acute hypoxemic and acute on chronic hypercapnic respiratory failure: S/p liberation from mechanical ventilation on 10/11/2019.  Elevated troponin/acute exacerbation of chronic systolic and diastolic CHF: Improved.  Start oral Lasix today.  Resume home medications (Aldactone and Entresto) tomorrow.  Elevated troponins may be due to demand ischemia.  Consulted cardiologist for further evaluation.  2D echo on 10/11/2019 was 30 to 35%.  2D echo in November 2020 showed EF estimated at 20%.  Hypertensive emergency: BP has improved.  Continue antihypertensives  COPD exacerbation: Continue IV steroids and bronchodilators  Type II DM: Metformin on hold.  NovoLog as needed for hyperglycemia  AKI on CKD stage IIIb: Creatinine improved.  Monitor BMP.  Body mass index is 29.13 kg/m.   Family Communication/Anticipated D/C date and plan/Code Status   DVT  prophylaxis: Lovenox Code Status: Full code Family Communication: Plan discussed with patient Disposition Plan: Possible discharge to home tomorrow      Subjective:   No complaints.  No shortness of breath, chest pain, cough or wheezing.  He said he had stopped taking his water pills for about a week because he ran out medicine  Objective:    Vitals:   10/12/19 0853 10/12/19 0931 10/12/19 1137 10/12/19 1142  BP: (!) 165/61 138/79 (!) 152/99 (!) 156/91  Pulse: 99 97 92 81  Resp:  Temp:  98.3 F (36.8 C) 97.8 F (36.6 C) 97.7 F (36.5 C)  TempSrc:  Oral Oral Oral  SpO2:  100% 100% 94%  Weight:      Height:        Intake/Output Summary (Last 24 hours) at 10/12/2019 1333 Last data filed at 10/12/2019 0809 Gross per 24 hour  Intake 564.38 ml  Output 1350 ml  Net -785.62 ml   Filed Weights   10/10/19 2122 10/12/19 0331  Weight: 81.6 kg 92.1 kg    Exam:  GEN: NAD SKIN: No rash EYES: EOMI ENT: MMM CV: RRR PULM: bibasilar rales, no wheezing ABD: soft, ND, NT, +BS CNS: AAO x 3, non focal EXT: No edema or tenderness   Data Reviewed:   I have personally reviewed following labs and imaging studies:  Labs: Labs show the following:   Basic Metabolic Panel: Recent Labs  Lab 10/10/19 2133 10/10/19 2133 10/11/19 0215 10/11/19 1242 10/12/19 0455  NA 141  --  142  --  141  K 4.5   < > 5.0  --  4.2  CL 105  --  106  --  107  CO2 27  --  29  --  24  GLUCOSE 279*  --  145*  --  209*  BUN 26*  --  27*  --  46*  CREATININE 1.77*  --  1.84*  --  1.58*  CALCIUM 9.1  --  9.2  --  8.8*  MG  --   --  1.9  --  1.9  PHOS  --   --  1.6* 4.8* 3.3   < > = values in this interval not displayed.   GFR Estimated Creatinine Clearance: 51 mL/min (A) (by C-G formula based on SCr of 1.58 mg/dL (H)). Liver Function Tests: Recent Labs  Lab 10/11/19 0506  AST 34  ALT 34  ALKPHOS 61  BILITOT 0.7  PROT 7.6  ALBUMIN 4.0   No results for input(s): LIPASE,  AMYLASE in the last 168 hours. No results for input(s): AMMONIA in the last 168 hours. Coagulation profile No results for input(s): INR, PROTIME in the last 168 hours.  CBC: Recent Labs  Lab 10/10/19 2133 10/11/19 0215 10/12/19 0455  WBC 11.1* 6.1 11.4*  NEUTROABS 3.7  --  9.5*  HGB 14.6 14.0 13.1  HCT 46.9 44.1 39.0  MCV 98.1 96.9 93.3  PLT 254 251 243   Cardiac Enzymes: No results for input(s): CKTOTAL, CKMB, CKMBINDEX, TROPONINI in the last 168 hours. BNP (last 3 results) No results for input(s): PROBNP in the last 8760 hours. CBG: Recent Labs  Lab 10/11/19 2316 10/12/19 0000 10/12/19 0323 10/12/19 0712 10/12/19 1142  GLUCAP 142* 158* 258* 130* 139*   D-Dimer: No results for input(s): DDIMER in the last 72 hours. Hgb A1c: Recent Labs    10/11/19 0506  HGBA1C 6.1*   Lipid Profile: No results for input(s): CHOL, HDL, LDLCALC, TRIG, CHOLHDL, LDLDIRECT in the last 72 hours. Thyroid function studies: No results for input(s): TSH, T4TOTAL, T3FREE, THYROIDAB in the last 72 hours.  Invalid input(s): FREET3 Anemia work up: No results for input(s): VITAMINB12, FOLATE, FERRITIN, TIBC, IRON, RETICCTPCT in the last 72 hours. Sepsis Labs: Recent Labs  Lab 10/10/19 2133 10/11/19 0215 10/11/19 0506 10/12/19 0455  PROCALCITON  --   --  0.72  --   WBC 11.1* 6.1  --  11.4*    Microbiology Recent Results (from the past 240 hour(s))  Respiratory Panel by RT PCR (Flu A&B, Covid) - Nasopharyngeal Swab     Status: None   Collection Time: 10/10/19  9:34 PM   Specimen: Nasopharyngeal Swab  Result Value Ref Range Status   SARS Coronavirus 2 by RT PCR NEGATIVE NEGATIVE Final    Comment: (NOTE) SARS-CoV-2 target nucleic acids are NOT DETECTED. The SARS-CoV-2 RNA is generally detectable in upper respiratoy specimens during the acute phase of infection. The lowest concentration of SARS-CoV-2 viral copies this assay can detect is 131 copies/mL. A negative result does not  preclude SARS-Cov-2 infection and should not be used as the sole basis for treatment or other patient management decisions. A negative result may occur with  improper specimen collection/handling, submission of specimen other than nasopharyngeal swab, presence of viral mutation(s) within the areas targeted by this assay, and inadequate number of viral copies (<131 copies/mL). A negative result must be combined with clinical observations, patient history, and epidemiological information. The expected result is Negative. Fact Sheet for Patients:  https://www.moore.com/ Fact Sheet for Healthcare Providers:  https://www.young.biz/ This test is not yet ap proved or  cleared by the [place] and  has been authorized for detection and/or diagnosis of SARS-CoV-2 by FDA under an Emergency Use Authorization (EUA). This EUA will remain  in effect (meaning this test can be used) for the duration of the COVID-19 declaration under Section 564(b)(1) of the Act, 21 U.S.C. section 360bbb-3(b)(1), unless the authorization is terminated or revoked sooner.    Influenza A by PCR NEGATIVE NEGATIVE Final   Influenza B by PCR NEGATIVE NEGATIVE Final    Comment: (NOTE) The Xpert Xpress SARS-CoV-2/FLU/RSV assay is intended as an aid in  the diagnosis of influenza from Nasopharyngeal swab specimens and  should not be used as a sole basis for treatment. Nasal washings and  aspirates are unacceptable for Xpert Xpress SARS-CoV-2/FLU/RSV  testing. Fact Sheet for Patients: PinkCheek.be Fact Sheet for Healthcare Providers: GravelBags.it This test is not yet approved or cleared by the [place] FDA and  has been authorized for detection and/or diagnosis of SARS-CoV-2 by  FDA under an Emergency Use Authorization (EUA). This EUA will remain  in effect (meaning this test can be used) for the duration of the   Covid-19 declaration under Section 564(b)(1) of the Act, 21  U.S.C. section 360bbb-3(b)(1), unless the authorization is  terminated or revoked. Performed at [facility], [address] [place], [place] [address]   MRSA PCR Screening     Status: None   Collection Time: 10/10/19 11:50 PM   Specimen: Nasal Mucosa; Nasopharyngeal  Result Value Ref Range Status   MRSA by PCR NEGATIVE NEGATIVE Final    Comment:        The GeneXpert MRSA Assay (FDA approved for NASAL specimens only), is one component of a comprehensive MRSA colonization surveillance program. It is not intended to diagnose MRSA infection nor to guide or monitor treatment for MRSA infections. Performed at [facility], [address] [place], [place] [address]   Culture, respiratory (non-expectorated)     Status: None (Preliminary result)   Collection Time: 10/11/19  1:10 AM   Specimen: Tracheal Aspirate; Respiratory  Result Value Ref Range Status   Specimen Description   Final    TRACHEAL ASPIRATE Performed at [facility], [address], [place], [place] [address]    Special Requests   Final    NONE Performed at [facility], [place], [place] [address]    Gram Stain   Final    RARE WBC PRESENT,BOTH PMN AND MONONUCLEAR ABUNDANT GRAM POSITIVE COCCI IN PAIRS IN CHAINS FEW GRAM NEGATIVE RODS    Culture   Final    CULTURE REINCUBATED FOR BETTER GROWTH Performed at [facility], [place] [address], [place], [place] [address]    Report Status PENDING  Incomplete    Procedures and diagnostic studies:  DG Abdomen 1 View  Result Date: 10/10/2019 CLINICAL DATA:  69 year old male found at nursing home in respiratory distress. Enteric tube placement. EXAM: ABDOMEN - 1 VIEW COMPARISON:  Portable chest from the same time. FINDINGS: Portable AP semi upright view at 2156 hours. Enteric tube courses in the midline into the upper abdomen. The tip is not  identified. It remains over the midline at the level of the upper lumbar spine. Small volume of gastric air also near the midline. Stable visible lungs from the portable chest including coarse bilateral interstitial opacity. IMPRESSION: Enteric tube courses to the abdomen and remains in the midline, most likely in the stomach although the tip is not included. Electronically  Signed   By: Odessa Fleming M.D.   On: 10/10/2019 22:26   DG Chest Port 1 View  Result Date: 10/11/2019 CLINICAL DATA:  Acute on chronic respiratory failure. EXAM: PORTABLE CHEST 1 VIEW COMPARISON:  Radiograph yesterday. FINDINGS: Endotracheal tube tip 4.2 cm from the carina. Enteric tube tip below the diaphragm not included in the field of view. Mild cardiomegaly with unchanged mediastinal contours. Improving interstitial opacities and Kerley B-lines, likely improving pulmonary edema, mild residual at the bases. No pneumothorax. IMPRESSION: 1. Improving interstitial opacities from prior exam, favor improving pulmonary edema over atypical infection. 2. Stable cardiomegaly. 3. Stable support apparatus. Electronically Signed   By: Narda Rutherford M.D.   On: 10/11/2019 04:19   DG Chest Port 1 View  Result Date: 10/10/2019 CLINICAL DATA:  69 year old male found at nursing home in respiratory distress. Intubated, enteric tube placement. EXAM: PORTABLE CHEST 1 VIEW COMPARISON:  Portable chest 12/08/2017 and earlier. FINDINGS: Portable AP views at 2151 hours. Endotracheal tube tip in good position between the level the clavicles and carina. Enteric tube courses to the abdomen, tip not included. Larger lung volumes. Cardiomegaly less apparent. Other mediastinal contours are within normal limits. No pneumothorax or pleural effusion, but there is widespread new coarse bilateral pulmonary interstitial opacity. No areas of consolidation identified. No acute osseous abnormality identified. IMPRESSION: 1. Endotracheal tube in good position. 2. New widespread  bilateral pulmonary interstitial opacity. Consider acute viral/atypical respiratory infection. No pleural effusion. Electronically Signed   By: Odessa Fleming M.D.   On: 10/10/2019 22:08   ECHOCARDIOGRAM COMPLETE  Result Date: 10/11/2019    ECHOCARDIOGRAM REPORT   Patient Name:   Jorge Cruz Date of Exam: 10/11/2019 Medical Rec #:  [id number]   Height:       70.0 in Accession #:    [id number]  Weight:       180.0 lb Date of Birth:  01-19-1951   BSA:          2.00 m Patient Age:    68 years    BP:           93/61 mmHg Patient Gender: M           HR:           77 bpm. Exam Location:  [facility] Procedure: 2D Echo, Cardiac Doppler and Color Doppler Indications:     Acute respiratory insufficiency 518.82  History:         Patient has prior history of Echocardiogram examinations, most                  recent 11/25/2017. COPD; Risk Factors:Hypertension and Diabetes.                  NICM, polysubstance abuse, tobacco abuse.  Sonographer:     Cristela Blue RDCS (AE) Referring Phys:  [id number] Eugenie Norrie Diagnosing Phys: Alwyn Pea MD IMPRESSIONS  1. Left ventricular ejection fraction, by estimation, is 30 to 35%. The left ventricle has moderately decreased function. The left ventricle demonstrates global hypokinesis. The left ventricular internal cavity size was moderately dilated. Left ventricular diastolic parameters were normal.  2. Right ventricular systolic function is low normal. The right ventricular size is mildly enlarged. There is normal pulmonary artery systolic pressure.  3. The mitral valve is normal in structure and function. Trivial mitral valve regurgitation.  4. The aortic valve is normal in structure and function. Aortic valve regurgitation is trivial. FINDINGS  Left Ventricle: Left ventricular ejection  fraction, by estimation, is 30 to 35%. The left ventricle has moderately decreased function. The left ventricle demonstrates global hypokinesis. The left ventricular internal cavity size was moderately dilated.  There is no left ventricular hypertrophy. Left ventricular diastolic parameters were normal. Right Ventricle: The right ventricular size is mildly enlarged. No increase in right ventricular wall thickness. Right ventricular systolic function is low normal. There is normal pulmonary artery systolic pressure. The tricuspid regurgitant velocity is 1.48 m/s, and with an assumed right atrial pressure of 10 mmHg, the estimated right ventricular systolic pressure is 18.8 mmHg. Left Atrium: Left atrial size was normal in size. Right Atrium: Right atrial size was normal in size. Pericardium: There is no evidence of pericardial effusion. Mitral Valve: The mitral valve is normal in structure and function. Trivial mitral valve regurgitation. Tricuspid Valve: The tricuspid valve is normal in structure. Tricuspid valve regurgitation is trivial. Aortic Valve: The aortic valve is normal in structure and function. Aortic valve regurgitation is trivial. Aortic valve mean gradient measures 3.0 mmHg. Aortic valve peak gradient measures 5.3 mmHg. Aortic valve area, by VTI measures 1.83 cm. Pulmonic Valve: The pulmonic valve was not assessed. Pulmonic valve regurgitation is not visualized. Aorta: The aortic root is normal in size and structure. IAS/Shunts: No atrial level shunt detected by color flow Doppler.  LEFT VENTRICLE PLAX 2D LVIDd:         5.96 cm      Diastology LVIDs:         5.37 cm      LV e' lateral: 2.72 cm/s LV PW:         1.47 cm      LV e' medial:  4.24 cm/s LV IVS:        0.96 cm LVOT diam:     2.10 cm LV SV:         42.26 ml LV SV Index:   18.70 LVOT Area:     3.46 cm  LV Volumes (MOD) LV vol d, MOD A2C: 179.0 ml LV vol d, MOD A4C: 170.0 ml LV vol s, MOD A2C: 114.0 ml LV vol s, MOD A4C: 147.0 ml LV SV MOD A2C:     65.0 ml LV SV MOD A4C:     170.0 ml LV SV MOD BP:      40.8 ml RIGHT VENTRICLE RV Basal diam:  4.20 cm RV S prime:     10.60 cm/s TAPSE (M-mode): 2.8 cm LEFT ATRIUM           Index       RIGHT ATRIUM            Index LA diam:      3.00 cm 1.50 cm/m  RA Area:     25.40 cm LA Vol (A2C): 26.3 ml 13.18 ml/m RA Volume:   86.60 ml  43.39 ml/m LA Vol (A4C): 34.3 ml 17.19 ml/m  AORTIC VALVE                   PULMONIC VALVE AV Area (Vmax):    1.56 cm    PV Vmax:        0.62 m/s AV Area (Vmean):   1.53 cm    PV Peak grad:   1.5 mmHg AV Area (VTI):     1.83 cm    RVOT Peak grad: 2 mmHg AV Vmax:           115.00 cm/s AV Vmean:  80.600 cm/s AV VTI:            0.230 m AV Peak Grad:      5.3 mmHg AV Mean Grad:      3.0 mmHg LVOT Vmax:         51.70 cm/s LVOT Vmean:        35.500 cm/s LVOT VTI:          0.122 m LVOT/AV VTI ratio: 0.53  AORTA Ao Root diam: 2.70 cm TRICUSPID VALVE TR Peak grad:   8.8 mmHg TR Vmax:        148.00 cm/s  SHUNTS Systemic VTI:  0.12 m Systemic Diam: 2.10 cm Jorge Salome Arnt MD Electronically signed by Alwyn Pea MD Signature Date/Time: 10/11/2019/3:10:19 PM    Final     Medications:   . aspirin  81 mg Per Tube Daily  . atorvastatin  80 mg Oral Daily  . budesonide (PULMICORT) nebulizer solution  0.5 mg Nebulization BID  . carvedilol  3.125 mg Per Tube BID  . enoxaparin (LOVENOX) injection  40 mg Subcutaneous Q24H  . folic acid  1 mg Oral Daily  . furosemide  40 mg Intravenous Daily  . insulin aspart  0-9 Units Subcutaneous Q4H  . ipratropium-albuterol  3 mL Nebulization TID  . multivitamin with minerals  1 tablet Per Tube Daily  . [START ON 10/13/2019] sacubitril-valsartan  1 tablet Oral BID  . spironolactone  25 mg Oral Daily  . thiamine  100 mg Oral Daily   Continuous Infusions:   LOS: 2 days   Jorge Cruz  [facility]     10/12/2019, 1:33 PM

## 2019-10-12 NOTE — Progress Notes (Signed)
Pt transferred to 240A by NT and Runner, broadcasting/film/video. Report given to Morrie Sheldon RN at 0900 prior to transportation.

## 2019-10-12 NOTE — Consult Note (Signed)
Cardiology Consultation:   Patient ID: Jorge Cruz MRN: [id number]; DOB: September 25, 1950  Admit date: 10/10/2019 Date of Consult: 10/12/2019  Primary Care Provider: Toy Cookey, FNP Primary Cardiologist: [place] Lee, Diona Fanti at [place] Primary Electrophysiologist:  Huel Coventry    Patient Profile:   Jorge Cruz is a 69 y.o. male with a hx of   who is being seen today for the evaluation of  CHF  at the request of  Dr. Myriam Forehand  .  History of Present Illness:   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  He he has nonobstructive coronary artery disease, hypertension, diabetes mellitus, chronic kidney disease stage III, COPD, polysubstance abuse.  He is previously seen by Dr. Okey Dupre in [place].  More recently has been seen at the [facility] system.  He was last seen by [facility] doctors in January, 2021.  He was scheduled to have an AICD placed but he did not because he became homeless again .  He admits to using a whole lot more salt than he should.  He also admits to running out of his medications approximately week before his admission.  He denies any chest pain.   The day of admission he started having severe shortness of breath and coughing.  He called EMS.  He made it to the door and then passed out.  He had respiratory failure, was intubated and mechanically ventilated.  He has been diuresed approximately 2.2 L is feeling quite a bit better.  He is back living in [place].  Thinks he will follow up here in [facility]    Heart Pathway Score:     Past Medical History:  Diagnosis Date  . Chronic combined systolic (congestive) and diastolic (congestive) heart failure (HCC)    a. 03/2014 Echo: EF 45%, glob HK; b. 03/2016 Echo: EF 20-25%, diff HK, Gr1 DD, mild MR; b. 11/2017 Echo: EF 20%, diff HK, Gr2 DD, mildly to mod reduced RV fxn, PASP .  . CKD (chronic kidney disease), stage  III   . COPD (chronic obstructive pulmonary disease) (HCC)   . Diabetes mellitus without complication (HCC)   . Hypertensive heart disease   . NICM (nonischemic cardiomyopathy) (HCC)    a. 03/2014 Echo: EF 45%, glob HK, mild conc LVH; b. 03/2014 MV: possible mild ischemia, superimposed on small inf infarct, EF 36%; c. 03/2016 MV: EF <30%, no ischemia; d. 03/2016 Echo: EF 20-25%, diff HK, Gr1 DD, mild MR; e. 05/2017 Cath: nonobs dzs, EF 20%; f. 11/2017 Echo: EF 20%, diff HK, Gr2 DD.  . Non-obstructive CAD (coronary artery disease)    a. 05/2017 Cath: LM nl, LAD 96m, LCX 33m, RCA 79m, EF 20%.  . Polysubstance abuse (HCC)   . Tobacco abuse     Past Surgical History:  Procedure Laterality Date  . RIGHT/LEFT HEART CATH AND CORONARY ANGIOGRAPHY N/A 06/01/2017   Procedure: RIGHT/LEFT HEART CATH AND CORONARY ANGIOGRAPHY;  Surgeon: Iran Ouch, MD;  Location: [facility] INVASIVE CV LAB;  Service: Cardiovascular;  Laterality: N/A;     Home Medications:  Prior to Admission medications   Medication Sig Start Date End Date Taking? Authorizing Provider  albuterol (PROVENTIL HFA;VENTOLIN HFA) 108 (90 Base) MCG/ACT inhaler Inhale 2 puffs into the lungs every 6 (six) hours as needed for wheezing or shortness of breath.    [provider]  aspirin EC 81 MG tablet Take 1 tablet (81 mg total)  by mouth daily. 04/01/16   Fritzi Mandes, MD  atorvastatin (LIPITOR) 80 MG tablet Take 80 mg by mouth daily.    [provider]  carvedilol (COREG) 3.125 MG tablet Take 1 tablet (3.125 mg total) by mouth 2 (two) times daily. 12/13/17 03/13/18  End, Harrell Gave, MD  carvedilol (COREG) 3.125 MG tablet Take 3.125 mg by mouth 2 (two) times daily with a meal.    [provider]  Fluticasone-Salmeterol (ADVAIR DISKUS) 250-50 MCG/DOSE AEPB Inhale 1 puff into the lungs 2 (two) times daily. 12/01/17 12/01/18  Dustin Flock, MD  furosemide (LASIX) 20 MG tablet Take 1 tablet (20 mg total) by mouth daily. 05/03/16  05/30/18  End, Harrell Gave, MD  ipratropium-albuterol (DUONEB) 0.5-2.5 (3) MG/3ML SOLN Take 3 mLs by nebulization every 4 (four) hours as needed. 12/01/17   Dustin Flock, MD  metFORMIN (GLUCOPHAGE) 1000 MG tablet Take 1 tablet by mouth 2 (two) times daily.    [provider]  nitroGLYCERIN (NITROSTAT) 0.4 MG SL tablet Place 0.4 mg under the tongue every 5 (five) minutes as needed for chest pain.    [provider]  sacubitril-valsartan (ENTRESTO) 49-51 MG Take 1 tablet by mouth 2 (two) times daily. 01/31/18   Theora Gianotti, NP  spironolactone (ALDACTONE) 25 MG tablet Take 1 tablet (25 mg total) by mouth daily. 05/16/18 08/14/18  End, Harrell Gave, MD    Inpatient Medications: Scheduled Meds: . aspirin  81 mg Per Tube Daily  . atorvastatin  80 mg Oral Daily  . budesonide (PULMICORT) nebulizer solution  0.5 mg Nebulization BID  . carvedilol  3.125 mg Per Tube BID  . enoxaparin (LOVENOX) injection  40 mg Subcutaneous Q24H  . folic acid  1 mg Oral Daily  . furosemide  40 mg Intravenous Daily  . insulin aspart  0-9 Units Subcutaneous Q4H  . ipratropium-albuterol  3 mL Nebulization TID  . multivitamin with minerals  1 tablet Per Tube Daily  . [START ON 10/13/2019] sacubitril-valsartan  1 tablet Oral BID  . spironolactone  25 mg Oral Daily  . thiamine  100 mg Oral Daily   Continuous Infusions:  PRN Meds: acetaminophen, hydrALAZINE  Allergies:   No Known Allergies  Social History:   Social History   Socioeconomic History  . Marital status: Married    Spouse name: Not on file  . Number of children: Not on file  . Years of education: Not on file  . Highest education level: Not on file  Occupational History  . Not on file  Tobacco Use  . Smoking status: Current Some Day Smoker    Packs/day: 0.25    Years: 50.00    Pack years: 12.50  . Smokeless tobacco: Never Used  Substance and Sexual Activity  . Alcohol use: No  . Drug use: No  . Sexual activity:  Not on file  Other Topics Concern  . Not on file  Social History Narrative  . Not on file   Social Determinants of Health   Financial Resource Strain:   . Difficulty of Paying Living Expenses: Not on file  Food Insecurity:   . Worried About Charity fundraiser in the Last Year: Not on file  . Ran Out of Food in the Last Year: Not on file  Transportation Needs:   . Lack of Transportation (Medical): Not on file  . Lack of Transportation (Non-Medical): Not on file  Physical Activity:   . Days of Exercise per Week: Not on file  . Minutes of  Exercise per Session: Not on file  Stress:   . Feeling of Stress : Not on file  Social Connections:   . Frequency of Communication with Friends and Family: Not on file  . Frequency of Social Gatherings with Friends and Family: Not on file  . Attends Religious Services: Not on file  . Active Member of Clubs or Organizations: Not on file  . Attends Banker Meetings: Not on file  . Marital Status: Not on file  Intimate Partner Violence:   . Fear of Current or Ex-Partner: Not on file  . Emotionally Abused: Not on file  . Physically Abused: Not on file  . Sexually Abused: Not on file    Family History:    Family History  Problem Relation Age of Onset  . Diabetes Mother   . Diabetes Father      ROS:  Please see the history of present illness.   All other ROS reviewed and negative.     Physical Exam/Data:   Vitals:   10/12/19 0853 10/12/19 0931 10/12/19 1137 10/12/19 1142  BP: (!) 165/61 138/79 (!) 152/99 (!) 156/91  Pulse: 99 97 92 81  Resp:  20 19 20   Temp:  98.3 F (36.8 C) 97.8 F (36.6 C) 97.7 F (36.5 C)  TempSrc:  Oral Oral Oral  SpO2:  100% 100% 94%  Weight:      Height:        Intake/Output Summary (Last 24 hours) at 10/12/2019 1432 Last data filed at 10/12/2019 0809 Gross per 24 hour  Intake 564.38 ml  Output 1350 ml  Net -785.62 ml   Last 3 Weights 10/12/2019 10/10/2019 05/16/2018  Weight (lbs) 203 lb  0.7 oz 180 lb 216 lb 8 oz  Weight (kg) 92.1 kg 81.647 kg 98.204 kg     Body mass index is 29.13 kg/m.  General:  Middle age male,  NAD  HEENT: normal Lymph: no adenopathy Neck: no JVD Endocrine:  No thryomegaly Vascular: No carotid bruits; FA pulses 2+ bilaterally without bruits  Cardiac:  normal S1, S2; RRR; no murmur  Lungs:  clear to auscultation bilaterally, no wheezing, rhonchi or rales  Abd: soft, nontender, no hepatomegaly  Ext: no edema Musculoskeletal:  No deformities, BUE and BLE strength normal and equal Skin: warm and dry  Neuro:  CNs 2-12 intact, no focal abnormalities noted Psych:  Normal affect   EKG:  The EKG was personally reviewed and demonstrates:   Sinus tach ,  NS St changes.   Telemetry:   NSR at 82.    Relevant CV Studies:   Laboratory Data:  High Sensitivity Troponin:   Recent Labs  Lab 10/10/19 2133 10/11/19 0020 10/11/19 0506 10/11/19 0822 10/11/19 0953  TROPONINIHS 105* 106* 472* 424* 410*     Chemistry Recent Labs  Lab 10/10/19 2133 10/11/19 0215 10/12/19 0455  NA 141 142 141  K 4.5 5.0 4.2  CL 105 106 107  CO2 27 29 24   GLUCOSE 279* 145* 209*  BUN 26* 27* 46*  CREATININE 1.77* 1.84* 1.58*  CALCIUM 9.1 9.2 8.8*  GFRNONAA 39* 37* 44*  GFRAA 45* 43* 51*  ANIONGAP 9 7 10     Recent Labs  Lab 10/11/19 0506  PROT 7.6  ALBUMIN 4.0  AST 34  ALT 34  ALKPHOS 61  BILITOT 0.7   Hematology Recent Labs  Lab 10/10/19 2133 10/11/19 0215 10/12/19 0455  WBC 11.1* 6.1 11.4*  RBC 4.78 4.55 4.18*  HGB 14.6 14.0 13.1  HCT 46.9 44.1 39.0  MCV 98.1 96.9 93.3  MCH 30.5 30.8 31.3  MCHC 31.1 31.7 33.6  RDW 15.0 14.9 15.2  PLT 254 251 243   BNP Recent Labs  Lab 10/10/19 2133  BNP 582.0*    DDimer No results for input(s): DDIMER in the last 168 hours.   Radiology/Studies:  DG Abdomen 1 View  Result Date: 10/10/2019 CLINICAL DATA:  68 year old male found at nursing home in respiratory distress. Enteric tube placement. EXAM:  ABDOMEN - 1 VIEW COMPARISON:  Portable chest from the same time. FINDINGS: Portable AP semi upright view at 2156 hours. Enteric tube courses in the midline into the upper abdomen. The tip is not identified. It remains over the midline at the level of the upper lumbar spine. Small volume of gastric air also near the midline. Stable visible lungs from the portable chest including coarse bilateral interstitial opacity. IMPRESSION: Enteric tube courses to the abdomen and remains in the midline, most likely in the stomach although the tip is not included. Electronically Signed   By: Odessa Fleming M.D.   On: 10/10/2019 22:26   DG Chest Port 1 View  Result Date: 10/11/2019 CLINICAL DATA:  Acute on chronic respiratory failure. EXAM: PORTABLE CHEST 1 VIEW COMPARISON:  Radiograph yesterday. FINDINGS: Endotracheal tube tip 4.2 cm from the carina. Enteric tube tip below the diaphragm not included in the field of view. Mild cardiomegaly with unchanged mediastinal contours. Improving interstitial opacities and Kerley B-lines, likely improving pulmonary edema, mild residual at the bases. No pneumothorax. IMPRESSION: 1. Improving interstitial opacities from prior exam, favor improving pulmonary edema over atypical infection. 2. Stable cardiomegaly. 3. Stable support apparatus. Electronically Signed   By: Narda Rutherford M.D.   On: 10/11/2019 04:19   DG Chest Port 1 View  Result Date: 10/10/2019 CLINICAL DATA:  69 year old male found at nursing home in respiratory distress. Intubated, enteric tube placement. EXAM: PORTABLE CHEST 1 VIEW COMPARISON:  Portable chest 12/08/2017 and earlier. FINDINGS: Portable AP views at 2151 hours. Endotracheal tube tip in good position between the level the clavicles and carina. Enteric tube courses to the abdomen, tip not included. Larger lung volumes. Cardiomegaly less apparent. Other mediastinal contours are within normal limits. No pneumothorax or pleural effusion, but there is widespread new  coarse bilateral pulmonary interstitial opacity. No areas of consolidation identified. No acute osseous abnormality identified. IMPRESSION: 1. Endotracheal tube in good position. 2. New widespread bilateral pulmonary interstitial opacity. Consider acute viral/atypical respiratory infection. No pleural effusion. Electronically Signed   By: Odessa Fleming M.D.   On: 10/10/2019 22:08   ECHOCARDIOGRAM COMPLETE  Result Date: 10/11/2019    ECHOCARDIOGRAM REPORT   Patient Name:   ALCARIO TINKEY Date of Exam: 10/11/2019 Medical Rec #:  [id number]   Height:       70.0 in Accession #:    [id number]  Weight:       180.0 lb Date of Birth:  [birth year]   BSA:          2.00 m Patient Age:    68 years    BP:           93/61 mmHg Patient Gender: M           HR:           77 bpm. Exam Location:  [facility] Procedure: 2D Echo, Cardiac Doppler and Color Doppler Indications:     Acute respiratory insufficiency 518.82  History:         Patient  has prior history of Echocardiogram examinations, most                  recent 11/25/2017. COPD; Risk Factors:Hypertension and Diabetes.                  NICM, polysubstance abuse, tobacco abuse.  Sonographer:     Cristela Blue RDCS (AE) Referring Phys:  [id number] Eugenie Norrie Diagnosing Phys: Alwyn Pea MD IMPRESSIONS  1. Left ventricular ejection fraction, by estimation, is 30 to 35%. The left ventricle has moderately decreased function. The left ventricle demonstrates global hypokinesis. The left ventricular internal cavity size was moderately dilated. Left ventricular diastolic parameters were normal.  2. Right ventricular systolic function is low normal. The right ventricular size is mildly enlarged. There is normal pulmonary artery systolic pressure.  3. The mitral valve is normal in structure and function. Trivial mitral valve regurgitation.  4. The aortic valve is normal in structure and function. Aortic valve regurgitation is trivial. FINDINGS  Left Ventricle: Left ventricular ejection fraction, by  estimation, is 30 to 35%. The left ventricle has moderately decreased function. The left ventricle demonstrates global hypokinesis. The left ventricular internal cavity size was moderately dilated. There is no left ventricular hypertrophy. Left ventricular diastolic parameters were normal. Right Ventricle: The right ventricular size is mildly enlarged. No increase in right ventricular wall thickness. Right ventricular systolic function is low normal. There is normal pulmonary artery systolic pressure. The tricuspid regurgitant velocity is 1.48 m/s, and with an assumed right atrial pressure of 10 mmHg, the estimated right ventricular systolic pressure is 18.8 mmHg. Left Atrium: Left atrial size was normal in size. Right Atrium: Right atrial size was normal in size. Pericardium: There is no evidence of pericardial effusion. Mitral Valve: The mitral valve is normal in structure and function. Trivial mitral valve regurgitation. Tricuspid Valve: The tricuspid valve is normal in structure. Tricuspid valve regurgitation is trivial. Aortic Valve: The aortic valve is normal in structure and function. Aortic valve regurgitation is trivial. Aortic valve mean gradient measures 3.0 mmHg. Aortic valve peak gradient measures 5.3 mmHg. Aortic valve area, by VTI measures 1.83 cm. Pulmonic Valve: The pulmonic valve was not assessed. Pulmonic valve regurgitation is not visualized. Aorta: The aortic root is normal in size and structure. IAS/Shunts: No atrial level shunt detected by color flow Doppler.  LEFT VENTRICLE PLAX 2D LVIDd:         5.96 cm      Diastology LVIDs:         5.37 cm      LV e' lateral: 2.72 cm/s LV PW:         1.47 cm      LV e' medial:  4.24 cm/s LV IVS:        0.96 cm LVOT diam:     2.10 cm LV SV:         42.26 ml LV SV Index:   18.70 LVOT Area:     3.46 cm  LV Volumes (MOD) LV vol d, MOD A2C: 179.0 ml LV vol d, MOD A4C: 170.0 ml LV vol s, MOD A2C: 114.0 ml LV vol s, MOD A4C: 147.0 ml LV SV MOD A2C:     65.0 ml  LV SV MOD A4C:     170.0 ml LV SV MOD BP:      40.8 ml RIGHT VENTRICLE RV Basal diam:  4.20 cm RV S prime:     10.60 cm/s TAPSE (M-mode): 2.8 cm LEFT  ATRIUM           Index       RIGHT ATRIUM           Index LA diam:      3.00 cm 1.50 cm/m  RA Area:     25.40 cm LA Vol (A2C): 26.3 ml 13.18 ml/m RA Volume:   86.60 ml  43.39 ml/m LA Vol (A4C): 34.3 ml 17.19 ml/m  AORTIC VALVE                   PULMONIC VALVE AV Area (Vmax):    1.56 cm    PV Vmax:        0.62 m/s AV Area (Vmean):   1.53 cm    PV Peak grad:   1.5 mmHg AV Area (VTI):     1.83 cm    RVOT Peak grad: 2 mmHg AV Vmax:           115.00 cm/s AV Vmean:          80.600 cm/s AV VTI:            0.230 m AV Peak Grad:      5.3 mmHg AV Mean Grad:      3.0 mmHg LVOT Vmax:         51.70 cm/s LVOT Vmean:        35.500 cm/s LVOT VTI:          0.122 m LVOT/AV VTI ratio: 0.53  AORTA Ao Root diam: 2.70 cm TRICUSPID VALVE TR Peak grad:   8.8 mmHg TR Vmax:        148.00 cm/s  SHUNTS Systemic VTI:  0.12 m Systemic Diam: 2.10 cm Dwayne Salome Arnt MD Electronically signed by Alwyn Pea MD Signature Date/Time: 10/11/2019/3:10:19 PM    Final      Assessment and Plan:   1. Acute on chronic combined systolic and diastolic congestive heart failure: Mr. Snoke is admitted with flash pulmonary edema.  He admits to eating more salt than he should and he also states that he had run out of his medicine approximately week before admission.  He did not have any episodes of chest pain prior to admission.  Troponin levels were mildly elevated but the trend is fairly flat but more consistent with CHF than  acute coronary syndrome.  Echo from yesterday suggest an inferiolateral wall motion.  I reviewed the echo from 2019 and the inferior wall contractility appears to be basically unchanged.   He is back to baseline from my standpoint.  He should be able to be discharged in the next several days     2.  DM:   Per primary team .   3.  Hyperlipidemia :  Cont  atorvastatin   4.  Acute on chronic renal insufficiency:   Plans per MI        For questions or updates, please contact [facility] Please consult [email or web address] for contact info under     Signed, Kristeen Miss, MD  10/12/2019 2:32 PM

## 2019-10-13 LAB — BASIC METABOLIC PANEL
Anion gap: 7 (ref 5–15)
BUN: 43 mg/dL — ABNORMAL HIGH (ref 8–23)
CO2: 28 mmol/L (ref 22–32)
Calcium: 8.8 mg/dL — ABNORMAL LOW (ref 8.9–10.3)
Chloride: 107 mmol/L (ref 98–111)
Creatinine, Ser: 1.11 mg/dL (ref 0.61–1.24)
GFR calc Af Amer: >60 mL/min (ref >60)
GFR calc non Af Amer: >60 mL/min (ref >60)
Glucose, Bld: 167 mg/dL — ABNORMAL HIGH (ref 70–99)
Potassium: 4.1 mmol/L (ref 3.5–5.1)
Sodium: 142 mmol/L (ref 135–145)

## 2019-10-13 LAB — CULTURE, RESPIRATORY W GRAM STAIN: Culture: NORMAL

## 2019-10-13 LAB — GLUCOSE, CAPILLARY: Glucose-Capillary: 99 mg/dL (ref 70–99)

## 2019-10-13 LAB — MAGNESIUM: Magnesium: 2.2 mg/dL (ref 1.7–2.4)

## 2019-10-13 MED ORDER — SPIRONOLACTONE 25 MG PO TABS: 25.0000 mg | ORAL_TABLET | Freq: Every day | ORAL | 0 refills | Status: DC

## 2019-10-13 MED ORDER — FUROSEMIDE 20 MG PO TABS: 20.0000 mg | ORAL_TABLET | Freq: Every day | ORAL | 0 refills | Status: DC

## 2019-10-13 NOTE — Plan of Care (Signed)
  Problem: Education: Goal: Knowledge of General Education information will improve Description Including pain rating scale, medication(s)/side effects and non-pharmacologic comfort measures Outcome: Adequate for Discharge   Problem: Health Behavior/Discharge Planning: Goal: Ability to manage health-related needs will improve Outcome: Adequate for Discharge   Problem: Clinical Measurements: Goal: Ability to maintain clinical measurements within normal limits will improve Outcome: Adequate for Discharge Goal: Will remain free from infection Outcome: Adequate for Discharge Goal: Diagnostic test results will improve Outcome: Adequate for Discharge Goal: Respiratory complications will improve Outcome: Adequate for Discharge Goal: Cardiovascular complication will be avoided Outcome: Adequate for Discharge   Problem: Activity: Goal: Risk for activity intolerance will decrease Outcome: Adequate for Discharge   Problem: Nutrition: Goal: Adequate nutrition will be maintained Outcome: Adequate for Discharge   Problem: Nutrition: Goal: Adequate nutrition will be maintained Outcome: Adequate for Discharge   Problem: Coping: Goal: Level of anxiety will decrease Outcome: Adequate for Discharge   Problem: Elimination: Goal: Will not experience complications related to bowel motility Outcome: Adequate for Discharge Goal: Will not experience complications related to urinary retention Outcome: Adequate for Discharge   Problem: Pain Managment: Goal: General experience of comfort will improve Outcome: Adequate for Discharge   Problem: Safety: Goal: Ability to remain free from injury will improve Outcome: Adequate for Discharge   Problem: Skin Integrity: Goal: Risk for impaired skin integrity will decrease Outcome: Adequate for Discharge   

## 2019-10-13 NOTE — TOC Transition Note (Signed)
Transition of Care [facility]) - CM/SW Discharge Note   Patient Details  Name: Jorge Cruz MRN: [id number] Date of Birth: [birth year]  [facility] of [facility] CM/SW Contact:  Dominic Pea, LCSW Phone Number: 10/13/2019, 12:49 PM   Clinical Narrative:    Received verbal consult for transportation need. Spoke with patient at bedside to address transition of care needs. Patient has his own room in a "boarding house-type" setting. Patient now has access to his medication, but did not previously due to moving to a new residence and medicine being mailed to previous residence. Patient confirmed his son brought his medication from the previous address to the current address and has not additional medication needs. Patient feels safe in his home.   Patient states he did have transportation arranged earlier this morning, but patient was not ready for discharge and relative was not able to wait for later discharge time. Patient without any financial means to pay for taxi and buses not available at this time. Taxi voucher provided to [employer]. No further TOC needs identified.   Final next level of care: Home/Self Care Barriers to Discharge: Barriers Resolved   Patient Goals and CMS Choice Patient states their goals for this hospitalization and ongoing recovery are:: Do better at home      Discharge Placement                       Discharge Plan and Services In-house Referral: Clinical Social Work Discharge Planning Services: Other - See comment(Transportation)                                 Social Determinants of Health (SDOH) Interventions     Readmission Risk Interventions No flowsheet data found.

## 2019-10-13 NOTE — Discharge Summary (Addendum)
Physician Discharge Summary  Jorge Cruz OTL:[id number] DOB: Apr 01, 1951 DOA: 10/10/2019  PCP: Toy Cookey, FNP  Admit date: 10/10/2019 Discharge date: 10/13/2019  Discharge disposition: Home   Recommendations for Outpatient Follow-Up:   Follow-up with PCP and cardiologist as an outpatient   Discharge Diagnosis:   Principal Problem:   Acute on chronic respiratory failure (HCC) Active Problems:   COPD exacerbation (HCC)   Acute on chronic combined systolic and diastolic CHF (congestive heart failure) (HCC)    Discharge Condition: Stable.  Diet recommendation: Low-salt diet  Code status: Full code.    Hospital Course:   Mr. Meilech Jorge Cruz is an 69 y.o. 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 He presented to [facility] ER on 02/18 via EMS with respiratory distress, O2 sats 75% on RA. En route to the ER EMS administered 3 inch nitropaste due to severe hypertension, albuterol treatment x1 dose, and duoneb x1 dose. Upon arrival to the ER pt alert/disoriented, diaphoretic, and tachypneic with labored respirations. Therefore, he required  intubation and mechanical ventilation. Due to sbp 170's/dbp's 100's nitroglycerin gtt initiated.  He was treated with IV Lasix.  He was also treated with steroids and bronchodilators for probable COPD exacerbation.  He had acute kidney injury on admission but this improved prior to discharge.  AKI was probably due to cardiorenal syndrome.  His condition improved and he was liberated from mechanical ventilator and transferred from the ICU to the medical floor for further management.  2D echo showed EF of 30 to 35% which is an improvement from previous EF.  His troponins were mildly elevated on admission.  Cardiologist was consulted because of elevated troponins and acute exacerbation of CHF.  Cardiologist was of the  view that elevated troponins were due to demand ischemia from acute CHF and respiratory failure.  Patient said he had run out of Lasix and so had not taken it for about a week prior to admission.  He has a history of cocaine and alcohol abuse.  However, he said that he had not used alcohol or cocaine for over 6 months.  His condition has improved and he is deemed stable for discharge to home today.  The importance of medical adherence was emphasized.     Discharge Exam:   Vitals:   10/13/19 0419 10/13/19 0734  BP: 132/71 124/72  Pulse: 82 63  Resp: 20 17  Temp: 98.3 F (36.8 C) 97.6 F (36.4 C)  SpO2: 100% 100%   Vitals:   10/12/19 1718 10/12/19 1954 10/13/19 0419 10/13/19 0734  BP: (!) 172/84 (!) 142/75 132/71 124/72  Pulse: 85 93 82 63  Resp: 19 20 20 17   Temp: 97.7 F (36.5 C) 98.3 F (36.8 C) 98.3 F (36.8 C) 97.6 F (36.4 C)  TempSrc: Oral Oral Oral Oral  SpO2: 100% 97% 100% 100%  Weight:   91.4 kg   Height:         GEN: NAD SKIN: No rash EYES: EOMI ENT: MMM CV: RRR PULM: CTA B ABD: soft, ND, NT, +BS CNS: AAO x 3, non focal EXT: No edema or tenderness   The results of significant diagnostics from this hospitalization (including imaging, microbiology, ancillary and laboratory) are listed below for reference.     Procedures and Diagnostic Studies:   DG Chest Port 1 View  Result Date: 10/11/2019 CLINICAL DATA:  Acute on chronic respiratory failure. EXAM: PORTABLE CHEST 1  VIEW COMPARISON:  Radiograph yesterday. FINDINGS: Endotracheal tube tip 4.2 cm from the carina. Enteric tube tip below the diaphragm not included in the field of view. Mild cardiomegaly with unchanged mediastinal contours. Improving interstitial opacities and Kerley B-lines, likely improving pulmonary edema, mild residual at the bases. No pneumothorax. IMPRESSION: 1. Improving interstitial opacities from prior exam, favor improving pulmonary edema over atypical infection. 2. Stable  cardiomegaly. 3. Stable support apparatus. Electronically Signed   By: Narda Rutherford M.D.   On: 10/11/2019 04:19   ECHOCARDIOGRAM COMPLETE  Result Date: 10/11/2019    ECHOCARDIOGRAM REPORT   Patient Name:   Jorge Cruz Date of Exam: 10/11/2019 Medical Rec #:  [id number]   Height:       70.0 in Accession #:    [id number]  Weight:       180.0 lb Date of Birth:  [birth year]   BSA:          2.00 m Patient Age:    68 years    BP:           93/61 mmHg Patient Gender: M           HR:           77 bpm. Exam Location:  [facility] Procedure: 2D Echo, Cardiac Doppler and Color Doppler Indications:     Acute respiratory insufficiency 518.82  History:         Patient has prior history of Echocardiogram examinations, most                  recent 11/25/2017. COPD; Risk Factors:Hypertension and Diabetes.                  NICM, polysubstance abuse, tobacco abuse.  Sonographer:     Cristela Blue RDCS (AE) Referring Phys:  [id number] Eugenie Norrie Diagnosing Phys: Alwyn Pea MD IMPRESSIONS  1. Left ventricular ejection fraction, by estimation, is 30 to 35%. The left ventricle has moderately decreased function. The left ventricle demonstrates global hypokinesis. The left ventricular internal cavity size was moderately dilated. Left ventricular diastolic parameters were normal.  2. Right ventricular systolic function is low normal. The right ventricular size is mildly enlarged. There is normal pulmonary artery systolic pressure.  3. The mitral valve is normal in structure and function. Trivial mitral valve regurgitation.  4. The aortic valve is normal in structure and function. Aortic valve regurgitation is trivial. FINDINGS  Left Ventricle: Left ventricular ejection fraction, by estimation, is 30 to 35%. The left ventricle has moderately decreased function. The left ventricle demonstrates global hypokinesis. The left ventricular internal cavity size was moderately dilated. There is no left ventricular hypertrophy. Left ventricular  diastolic parameters were normal. Right Ventricle: The right ventricular size is mildly enlarged. No increase in right ventricular wall thickness. Right ventricular systolic function is low normal. There is normal pulmonary artery systolic pressure. The tricuspid regurgitant velocity is 1.48 m/s, and with an assumed right atrial pressure of 10 mmHg, the estimated right ventricular systolic pressure is 18.8 mmHg. Left Atrium: Left atrial size was normal in size. Right Atrium: Right atrial size was normal in size. Pericardium: There is no evidence of pericardial effusion. Mitral Valve: The mitral valve is normal in structure and function. Trivial mitral valve regurgitation. Tricuspid Valve: The tricuspid valve is normal in structure. Tricuspid valve regurgitation is trivial. Aortic Valve: The aortic valve is normal in structure and function. Aortic valve regurgitation is trivial. Aortic valve mean gradient measures 3.0 mmHg. Aortic valve  peak gradient measures 5.3 mmHg. Aortic valve area, by VTI measures 1.83 cm. Pulmonic Valve: The pulmonic valve was not assessed. Pulmonic valve regurgitation is not visualized. Aorta: The aortic root is normal in size and structure. IAS/Shunts: No atrial level shunt detected by color flow Doppler.  LEFT VENTRICLE PLAX 2D LVIDd:         5.96 cm      Diastology LVIDs:         5.37 cm      LV e' lateral: 2.72 cm/s LV PW:         1.47 cm      LV e' medial:  4.24 cm/s LV IVS:        0.96 cm LVOT diam:     2.10 cm LV SV:         42.26 ml LV SV Index:   18.70 LVOT Area:     3.46 cm  LV Volumes (MOD) LV vol d, MOD A2C: 179.0 ml LV vol d, MOD A4C: 170.0 ml LV vol s, MOD A2C: 114.0 ml LV vol s, MOD A4C: 147.0 ml LV SV MOD A2C:     65.0 ml LV SV MOD A4C:     170.0 ml LV SV MOD BP:      40.8 ml RIGHT VENTRICLE RV Basal diam:  4.20 cm RV S prime:     10.60 cm/s TAPSE (M-mode): 2.8 cm LEFT ATRIUM           Index       RIGHT ATRIUM           Index LA diam:      3.00 cm 1.50 cm/m  RA Area:      25.40 cm LA Vol (A2C): 26.3 ml 13.18 ml/m RA Volume:   86.60 ml  43.39 ml/m LA Vol (A4C): 34.3 ml 17.19 ml/m  AORTIC VALVE                   PULMONIC VALVE AV Area (Vmax):    1.56 cm    PV Vmax:        0.62 m/s AV Area (Vmean):   1.53 cm    PV Peak grad:   1.5 mmHg AV Area (VTI):     1.83 cm    RVOT Peak grad: 2 mmHg AV Vmax:           115.00 cm/s AV Vmean:          80.600 cm/s AV VTI:            0.230 m AV Peak Grad:      5.3 mmHg AV Mean Grad:      3.0 mmHg LVOT Vmax:         51.70 cm/s LVOT Vmean:        35.500 cm/s LVOT VTI:          0.122 m LVOT/AV VTI ratio: 0.53  AORTA Ao Root diam: 2.70 cm TRICUSPID VALVE TR Peak grad:   8.8 mmHg TR Vmax:        148.00 cm/s  SHUNTS Systemic VTI:  0.12 m Systemic Diam: 2.10 cm Alwyn Pea MD Electronically signed by Alwyn Pea MD Signature Date/Time: 10/11/2019/3:10:19 PM    Final      Labs:   Basic Metabolic Panel: Recent Labs  Lab 10/10/19 2133 10/10/19 2133 10/11/19 0215 10/11/19 0215 10/11/19 1242 10/12/19 0455 10/13/19 0428  NA 141  --  142  --   --  141 142  K 4.5   < > 5.0   < >  --  4.2 4.1  CL 105  --  106  --   --  107 107  CO2 27  --  29  --   --  24 28  GLUCOSE 279*  --  145*  --   --  209* 167*  BUN 26*  --  27*  --   --  46* 43*  CREATININE 1.77*  --  1.84*  --   --  1.58* 1.11  CALCIUM 9.1  --  9.2  --   --  8.8* 8.8*  MG  --   --  1.9  --   --  1.9 2.2  PHOS  --   --  1.6*  --  4.8* 3.3  --    < > = values in this interval not displayed.   GFR Estimated Creatinine Clearance: 72.4 mL/min (by C-G formula based on SCr of 1.11 mg/dL). Liver Function Tests: Recent Labs  Lab 10/11/19 0506  AST 34  ALT 34  ALKPHOS 61  BILITOT 0.7  PROT 7.6  ALBUMIN 4.0   No results for input(s): LIPASE, AMYLASE in the last 168 hours. No results for input(s): AMMONIA in the last 168 hours. Coagulation profile No results for input(s): INR, PROTIME in the last 168 hours.  CBC: Recent Labs  Lab 10/10/19 2133  10/11/19 0215 10/12/19 0455  WBC 11.1* 6.1 11.4*  NEUTROABS 3.7  --  9.5*  HGB 14.6 14.0 13.1  HCT 46.9 44.1 39.0  MCV 98.1 96.9 93.3  PLT 254 251 243   Cardiac Enzymes: No results for input(s): CKTOTAL, CKMB, CKMBINDEX, TROPONINI in the last 168 hours. BNP: Invalid input(s): POCBNP CBG: Recent Labs  Lab 10/12/19 0712 10/12/19 1142 10/12/19 1718 10/12/19 2110 10/13/19 0735  GLUCAP 130* 139* 188* 212* 99   D-Dimer No results for input(s): DDIMER in the last 72 hours. Hgb A1c Recent Labs    10/11/19 0506  HGBA1C 6.1*   Lipid Profile No results for input(s): CHOL, HDL, LDLCALC, TRIG, CHOLHDL, LDLDIRECT in the last 72 hours. Thyroid function studies No results for input(s): TSH, T4TOTAL, T3FREE, THYROIDAB in the last 72 hours.  Invalid input(s): FREET3 Anemia work up No results for input(s): VITAMINB12, FOLATE, FERRITIN, TIBC, IRON, RETICCTPCT in the last 72 hours. Microbiology Recent Results (from the past 240 hour(s))  Respiratory Panel by RT PCR (Flu A&B, Covid) - Nasopharyngeal Swab     Status: None   Collection Time: 10/10/19  9:34 PM   Specimen: Nasopharyngeal Swab  Result Value Ref Range Status   SARS Coronavirus 2 by RT PCR NEGATIVE NEGATIVE Final    Comment: (NOTE) SARS-CoV-2 target nucleic acids are NOT DETECTED. The SARS-CoV-2 RNA is generally detectable in upper respiratoy specimens during the acute phase of infection. The lowest concentration of SARS-CoV-2 viral copies this assay can detect is 131 copies/mL. A negative result does not preclude SARS-Cov-2 infection and should not be used as the sole basis for treatment or other patient management decisions. A negative result may occur with  improper specimen collection/handling, submission of specimen other than nasopharyngeal swab, presence of viral mutation(s) within the areas targeted by this assay, and inadequate number of viral copies (<131 copies/mL). A negative result must be combined with  clinical observations, patient history, and epidemiological information. The expected result is Negative. Fact Sheet for Patients:  PinkCheek.be Fact Sheet for Healthcare Providers:  GravelBags.it This test is not yet ap proved or  cleared by the [place] and  has been authorized for detection and/or diagnosis of SARS-CoV-2 by FDA under an Emergency Use Authorization (EUA). This EUA will remain  in effect (meaning this test can be used) for the duration of the COVID-19 declaration under Section 564(b)(1) of the Act, 21 U.S.C. section 360bbb-3(b)(1), unless the authorization is terminated or revoked sooner.    Influenza A by PCR NEGATIVE NEGATIVE Final   Influenza B by PCR NEGATIVE NEGATIVE Final    Comment: (NOTE) The Xpert Xpress SARS-CoV-2/FLU/RSV assay is intended as an aid in  the diagnosis of influenza from Nasopharyngeal swab specimens and  should not be used as a sole basis for treatment. Nasal washings and  aspirates are unacceptable for Xpert Xpress SARS-CoV-2/FLU/RSV  testing. Fact Sheet for Patients: https://www.moore.com/ Fact Sheet for Healthcare Providers: https://www.young.biz/ This test is not yet approved or cleared by the [place] FDA and  has been authorized for detection and/or diagnosis of SARS-CoV-2 by  FDA under an Emergency Use Authorization (EUA). This EUA will remain  in effect (meaning this test can be used) for the duration of the  Covid-19 declaration under Section 564(b)(1) of the Act, 21  U.S.C. section 360bbb-3(b)(1), unless the authorization is  terminated or revoked. Performed at [facility], [address] [place], [place] [address]   MRSA PCR Screening     Status: None   Collection Time: 10/10/19 11:50 PM   Specimen: Nasal Mucosa; Nasopharyngeal  Result Value Ref Range Status   MRSA by PCR NEGATIVE NEGATIVE Final     Comment:        The GeneXpert MRSA Assay (FDA approved for NASAL specimens only), is one component of a comprehensive MRSA colonization surveillance program. It is not intended to diagnose MRSA infection nor to guide or monitor treatment for MRSA infections. Performed at [facility], [address] [place], [place] [address]   Culture, respiratory (non-expectorated)     Status: None   Collection Time: 10/11/19  1:10 AM   Specimen: Tracheal Aspirate; Respiratory  Result Value Ref Range Status   Specimen Description   Final    TRACHEAL ASPIRATE Performed at [facility], [address] [place], [place] [address]    Special Requests   Final    NONE Performed at [facility], [address] [place], [place] [address]    Gram Stain   Final    RARE WBC PRESENT,BOTH PMN AND MONONUCLEAR ABUNDANT GRAM POSITIVE COCCI IN PAIRS IN CHAINS FEW GRAM NEGATIVE RODS    Culture   Final    Consistent with normal respiratory flora. Performed at [facility], [address] [place], [place] [address]    Report Status 10/13/2019 FINAL  Final     Discharge Instructions:   Discharge Instructions    AMB referral to CHF clinic   Complete by: As directed    Diet - low sodium heart healthy   Complete by: As directed    Diet Carb Modified   Complete by: As directed    Increase activity slowly   Complete by: As directed      Allergies as of 10/13/2019   No Known Allergies     Medication List    TAKE these medications   albuterol 108 (90 Base) MCG/ACT inhaler Commonly known as: VENTOLIN HFA Inhale 2 puffs into the lungs every 6 (six) hours as needed for wheezing or shortness of breath.   aspirin EC 81 MG tablet Take  1 tablet (81 mg total) by mouth daily.   atorvastatin 80 MG tablet Commonly known as: LIPITOR Take 80 mg by mouth daily.   carvedilol 3.125 MG tablet Commonly known as: COREG Take 3.125 mg by mouth 2 (two) times daily with a  meal. What changed: Another medication with the same name was removed. Continue taking this medication, and follow the directions you see here.   Fluticasone-Salmeterol 250-50 MCG/DOSE Aepb Commonly known as: Advair Diskus Inhale 1 puff into the lungs 2 (two) times daily.   furosemide 20 MG tablet Commonly known as: LASIX Take 1 tablet (20 mg total) by mouth daily.   ipratropium-albuterol 0.5-2.5 (3) MG/3ML Soln Commonly known as: DUONEB Take 3 mLs by nebulization every 4 (four) hours as needed.   metFORMIN 1000 MG tablet Commonly known as: GLUCOPHAGE Take 1 tablet by mouth 2 (two) times daily.   nitroGLYCERIN 0.4 MG SL tablet Commonly known as: NITROSTAT Place 0.4 mg under the tongue every 5 (five) minutes as needed for chest pain.   sacubitril-valsartan 49-51 MG Commonly known as: ENTRESTO Take 1 tablet by mouth 2 (two) times daily.   spironolactone 25 MG tablet Commonly known as: ALDACTONE Take 1 tablet (25 mg total) by mouth daily.      Follow-up Information    [facility] Follow up on 10/22/2019.   Specialty: Cardiology Why: at 1:00pm. Enter through the Medical Mall entrance Contact information: [address] [place] [address] [phone]           Time coordinating discharge: 25 minutes  Signed:  Lurene Shadow  [facility] 10/13/2019, 11:09 AM

## 2019-10-13 NOTE — Progress Notes (Signed)
Progress Note  Patient Name: Roland Lipke Date of Encounter: 10/13/2019  Primary Cardiologist: Nelva Bush, MD    Subjective   69 year old gentleman with a history of acute on chronic combined systolic and diastolic congestive heart failure.  He was admitted with flash pulmonary edema.  He admits to eating more salt than he should and he also admits that he had run out of his medications a week or so before admission.  He has diuresed 3.8 L so far during this admission and is feeling better.  Inpatient Medications    Scheduled Meds: . aspirin  81 mg Per Tube Daily  . atorvastatin  80 mg Oral Daily  . budesonide (PULMICORT) nebulizer solution  0.5 mg Nebulization BID  . carvedilol  3.125 mg Per Tube BID  . enoxaparin (LOVENOX) injection  40 mg Subcutaneous Q24H  . folic acid  1 mg Oral Daily  . furosemide  40 mg Intravenous Daily  . insulin aspart  0-5 Units Subcutaneous QHS  . insulin aspart  0-9 Units Subcutaneous TID WC  . ipratropium-albuterol  3 mL Nebulization TID  . multivitamin with minerals  1 tablet Per Tube Daily  . sacubitril-valsartan  1 tablet Oral BID  . spironolactone  25 mg Oral Daily  . thiamine  100 mg Oral Daily   Continuous Infusions:  PRN Meds: acetaminophen, hydrALAZINE   Vital Signs    Vitals:   10/12/19 1718 10/12/19 1954 10/13/19 0419 10/13/19 0734  BP: (!) 172/84 (!) 142/75 132/71 124/72  Pulse: 85 93 82 63  Resp: 19 20 20 17   Temp: 97.7 F (36.5 C) 98.3 F (36.8 C) 98.3 F (36.8 C) 97.6 F (36.4 C)  TempSrc: Oral Oral Oral Oral  SpO2: 100% 97% 100% 100%  Weight:   91.4 kg   Height:        Intake/Output Summary (Last 24 hours) at 10/13/2019 1228 Last data filed at 10/13/2019 0422 Gross per 24 hour  Intake -  Output 1700 ml  Net -1700 ml   Last 3 Weights 10/13/2019 10/12/2019 10/10/2019  Weight (lbs) 201 lb 9.6 oz 203 lb 0.7 oz 180 lb  Weight (kg) 91.445 kg 92.1 kg 81.647 kg      Telemetry    - Personally Reviewed  ECG     Personally Reviewed  Physical Exam   GEN:  Middle age man  No acute distress.   Neck: No JVD Cardiac: RRR, no murmurs, rubs, or gallops.  Respiratory: Clear to auscultation bilaterally. GI: Soft, nontender, non-distended  MS: No edema; No deformity. Neuro:  Nonfocal  Psych: Normal affect   Labs    High Sensitivity Troponin:   Recent Labs  Lab 10/10/19 2133 10/11/19 0020 10/11/19 0506 10/11/19 0822 10/11/19 0953  TROPONINIHS 105* 106* 472* 424* 410*      Chemistry Recent Labs  Lab 10/11/19 0215 10/11/19 0506 10/12/19 0455 10/13/19 0428  NA 142  --  141 142  K 5.0  --  4.2 4.1  CL 106  --  107 107  CO2 29  --  24 28  GLUCOSE 145*  --  209* 167*  BUN 27*  --  46* 43*  CREATININE 1.84*  --  1.58* 1.11  CALCIUM 9.2  --  8.8* 8.8*  PROT  --  7.6  --   --   ALBUMIN  --  4.0  --   --   AST  --  34  --   --   ALT  --  34  --   --   ALKPHOS  --  61  --   --   BILITOT  --  0.7  --   --   GFRNONAA 37*  --  44* >60  GFRAA 43*  --  51* >60  ANIONGAP 7  --  10 7     Hematology Recent Labs  Lab 10/10/19 2133 10/11/19 0215 10/12/19 0455  WBC 11.1* 6.1 11.4*  RBC 4.78 4.55 4.18*  HGB 14.6 14.0 13.1  HCT 46.9 44.1 39.0  MCV 98.1 96.9 93.3  MCH 30.5 30.8 31.3  MCHC 31.1 31.7 33.6  RDW 15.0 14.9 15.2  PLT 254 251 243    BNP Recent Labs  Lab 10/10/19 2133  BNP 582.0*     DDimer No results for input(s): DDIMER in the last 168 hours.   Radiology    ECHOCARDIOGRAM COMPLETE  Result Date: 10/11/2019    ECHOCARDIOGRAM REPORT   Patient Name:   LATAVIUS CAPIZZI Date of Exam: 10/11/2019 Medical Rec #:  [id number]   Height:       70.0 in Accession #:    [id number]  Weight:       180.0 lb Date of Birth:  11-06-1950   BSA:          2.00 m Patient Age:    68 years    BP:           93/61 mmHg Patient Gender: M           HR:           77 bpm. Exam Location:  [facility] Procedure: 2D Echo, Cardiac Doppler and Color Doppler Indications:     Acute respiratory insufficiency 518.82   History:         Patient has prior history of Echocardiogram examinations, most                  recent 11/25/2017. COPD; Risk Factors:Hypertension and Diabetes.                  NICM, polysubstance abuse, tobacco abuse.  Sonographer:     Cristela Blue RDCS (AE) Referring Phys:  [id number] Eugenie Norrie Diagnosing Phys: Alwyn Pea MD IMPRESSIONS  1. Left ventricular ejection fraction, by estimation, is 30 to 35%. The left ventricle has moderately decreased function. The left ventricle demonstrates global hypokinesis. The left ventricular internal cavity size was moderately dilated. Left ventricular diastolic parameters were normal.  2. Right ventricular systolic function is low normal. The right ventricular size is mildly enlarged. There is normal pulmonary artery systolic pressure.  3. The mitral valve is normal in structure and function. Trivial mitral valve regurgitation.  4. The aortic valve is normal in structure and function. Aortic valve regurgitation is trivial. FINDINGS  Left Ventricle: Left ventricular ejection fraction, by estimation, is 30 to 35%. The left ventricle has moderately decreased function. The left ventricle demonstrates global hypokinesis. The left ventricular internal cavity size was moderately dilated. There is no left ventricular hypertrophy. Left ventricular diastolic parameters were normal. Right Ventricle: The right ventricular size is mildly enlarged. No increase in right ventricular wall thickness. Right ventricular systolic function is low normal. There is normal pulmonary artery systolic pressure. The tricuspid regurgitant velocity is 1.48 m/s, and with an assumed right atrial pressure of 10 mmHg, the estimated right ventricular systolic pressure is 18.8 mmHg. Left Atrium: Left atrial size was normal in size. Right Atrium: Right atrial size was normal in size. Pericardium: There  is no evidence of pericardial effusion. Mitral Valve: The mitral valve is normal in structure and  function. Trivial mitral valve regurgitation. Tricuspid Valve: The tricuspid valve is normal in structure. Tricuspid valve regurgitation is trivial. Aortic Valve: The aortic valve is normal in structure and function. Aortic valve regurgitation is trivial. Aortic valve mean gradient measures 3.0 mmHg. Aortic valve peak gradient measures 5.3 mmHg. Aortic valve area, by VTI measures 1.83 cm. Pulmonic Valve: The pulmonic valve was not assessed. Pulmonic valve regurgitation is not visualized. Aorta: The aortic root is normal in size and structure. IAS/Shunts: No atrial level shunt detected by color flow Doppler.  LEFT VENTRICLE PLAX 2D LVIDd:         5.96 cm      Diastology LVIDs:         5.37 cm      LV e' lateral: 2.72 cm/s LV PW:         1.47 cm      LV e' medial:  4.24 cm/s LV IVS:        0.96 cm LVOT diam:     2.10 cm LV SV:         42.26 ml LV SV Index:   18.70 LVOT Area:     3.46 cm  LV Volumes (MOD) LV vol d, MOD A2C: 179.0 ml LV vol d, MOD A4C: 170.0 ml LV vol s, MOD A2C: 114.0 ml LV vol s, MOD A4C: 147.0 ml LV SV MOD A2C:     65.0 ml LV SV MOD A4C:     170.0 ml LV SV MOD BP:      40.8 ml RIGHT VENTRICLE RV Basal diam:  4.20 cm RV S prime:     10.60 cm/s TAPSE (M-mode): 2.8 cm LEFT ATRIUM           Index       RIGHT ATRIUM           Index LA diam:      3.00 cm 1.50 cm/m  RA Area:     25.40 cm LA Vol (A2C): 26.3 ml 13.18 ml/m RA Volume:   86.60 ml  43.39 ml/m LA Vol (A4C): 34.3 ml 17.19 ml/m  AORTIC VALVE                   PULMONIC VALVE AV Area (Vmax):    1.56 cm    PV Vmax:        0.62 m/s AV Area (Vmean):   1.53 cm    PV Peak grad:   1.5 mmHg AV Area (VTI):     1.83 cm    RVOT Peak grad: 2 mmHg AV Vmax:           115.00 cm/s AV Vmean:          80.600 cm/s AV VTI:            0.230 m AV Peak Grad:      5.3 mmHg AV Mean Grad:      3.0 mmHg LVOT Vmax:         51.70 cm/s LVOT Vmean:        35.500 cm/s LVOT VTI:          0.122 m LVOT/AV VTI ratio: 0.53  AORTA Ao Root diam: 2.70 cm TRICUSPID VALVE TR Peak  grad:   8.8 mmHg TR Vmax:        148.00 cm/s  SHUNTS Systemic VTI:  0.12 m Systemic Diam: 2.10 cm Dwayne D  Callwood MD Electronically signed by Alwyn Pea MD Signature Date/Time: 10/11/2019/3:10:19 PM    Final     Cardiac Studies     Patient Profile     69 y.o. male   Assessment & Plan     1.   Acute on chronic combined systolic and diastolic congestive heart failure: The patient is doing much better after restarting of his medications and diuresis.  He has been eating lots of salty food and also had run out of his medications.  He now lives in [place] and was previously receiving his medications at an address in [place].  Stressed importance of him continuing with his current medical regimen.  He will follow-up in our office in the next several weeks.   Ok for DC today       For questions or updates, please contact [facility] Please consult [email or web address] for contact info under        Signed, Kristeen Miss, MD  10/13/2019, 12:28 PM

## 2019-10-15 ENCOUNTER — Telehealth: Payer: Self-pay | Admitting: Internal Medicine

## 2019-10-15 LAB — ETHANOL: Alcohol, Ethyl (B): <10 mg/dL (ref <10)

## 2019-10-15 NOTE — Telephone Encounter (Signed)
-----   Message from Yvonne Kendall, MD sent at 10/14/2019  7:12 AM EST ----- Hello,  Could you try to set Mr. Panico up to see me or an APP for hospital follow-up in the next 2 weeks?  Thanks.  [name] ----- Message ----- From: Vesta Mixer, MD Sent: 10/13/2019  12:37 PM EST To: Yvonne Kendall, MD  Mr. Keltner has moved back to the [place] area and was hospitalized after he ran out of his meds and developed CHF  He will need follow up in several weeks and a BMP  Thanks  Michele Mcalpine

## 2019-10-15 NOTE — Telephone Encounter (Signed)
LMOV to schedule  

## 2019-10-21 NOTE — Progress Notes (Deleted)
Patient ID: Jorge Cruz, male    DOB: July 06, 1951, 69 y.o.   MRN: [id number]  HPI  Mr Lurz is a 69 y/o male with a history of DM, COPD, HTN, current tobacco use and chronic heart failure.  Echo report from 10/11/19 reviewed and showed an EF of 30-35% along with trivial MR. Echo report from 11/25/17 reviewed and showed an EF of 20-25% along with a mildly elevated PA pressure of 35 mm Hg. Echo report from 04/01/16 was reviewed and shows an EF of 20-25% along with mild MR.   Cardiac catheterization was done 06/01/17 which showed an EF of 20% along with mild nonobstructive CAD. RHC also done which showed normal filling pressures, normal pulmonary pressure and moderately reduced cardiac output. Cardiac output was 3.82 with a cardiac index of 1.83.  Admitted 10/10/19 due to   He presents today for a follow-up visit although hasn't been seen since August 2019. He presents with a chief complaint of   Past Medical History:  Diagnosis Date  . Chronic combined systolic (congestive) and diastolic (congestive) heart failure (HCC)    a. 03/2014 Echo: EF 45%, glob HK; b. 03/2016 Echo: EF 20-25%, diff HK, Gr1 DD, mild MR; b. 11/2017 Echo: EF 20%, diff HK, Gr2 DD, mildly to mod reduced RV fxn, PASP .  . CKD (chronic kidney disease), stage III   . COPD (chronic obstructive pulmonary disease) (HCC)   . Diabetes mellitus without complication (HCC)   . Hypertensive heart disease   . NICM (nonischemic cardiomyopathy) (HCC)    a. 03/2014 Echo: EF 45%, glob HK, mild conc LVH; b. 03/2014 MV: possible mild ischemia, superimposed on small inf infarct, EF 36%; c. 03/2016 MV: EF <30%, no ischemia; d. 03/2016 Echo: EF 20-25%, diff HK, Gr1 DD, mild MR; e. 05/2017 Cath: nonobs dzs, EF 20%; f. 11/2017 Echo: EF 20%, diff HK, Gr2 DD.  . Non-obstructive CAD (coronary artery disease)    a. 05/2017 Cath: LM nl, LAD 40m, LCX 63m, RCA 27m, EF 20%.  . Polysubstance abuse (HCC)   . Tobacco abuse    Past Surgical History:  Procedure  Laterality Date  . RIGHT/LEFT HEART CATH AND CORONARY ANGIOGRAPHY N/A 06/01/2017   Procedure: RIGHT/LEFT HEART CATH AND CORONARY ANGIOGRAPHY;  Surgeon: Iran Ouch, MD;  Location: [facility] INVASIVE CV LAB;  Service: Cardiovascular;  Laterality: N/A;   Family History  Problem Relation Age of Onset  . Diabetes Mother   . Diabetes Father    Social History   Tobacco Use  . Smoking status: Current Some Day Smoker    Packs/day: 0.25    Years: 50.00    Pack years: 12.50  . Smokeless tobacco: Never Used  Substance Use Topics  . Alcohol use: No   No Known Allergies    Review of Systems  Constitutional: Negative for appetite change and fatigue.  HENT: Positive for rhinorrhea. Negative for congestion and sore throat.   Eyes: Negative.   Respiratory: Negative for cough, chest tightness and shortness of breath.   Cardiovascular: Positive for chest pain (on occasion). Negative for palpitations and leg swelling.  Gastrointestinal: Negative for abdominal distention and abdominal pain.  Endocrine: Negative.   Genitourinary: Negative.   Musculoskeletal: Positive for neck pain (at times). Negative for back pain.  Skin: Negative.   Allergic/Immunologic: Negative.   Neurological: Positive for light-headedness (on occasion). Negative for dizziness.  Hematological: Negative for adenopathy. Does not bruise/bleed easily.  Psychiatric/Behavioral: Negative for dysphoric mood and sleep disturbance (sleeping on 2  pillows). The patient is not nervous/anxious.      Physical Exam  Constitutional: He is oriented to person, place, and time. He appears well-developed and well-nourished.  HENT:  Head: Normocephalic and atraumatic.  Neck: No JVD present.  Cardiovascular: Normal rate and regular rhythm.  Pulmonary/Chest: Effort normal. He has no wheezes. He has no rales.  Abdominal: Soft. He exhibits no distension. There is no abdominal tenderness.  Musculoskeletal:        General: No tenderness or  edema.     Cervical back: Normal range of motion and neck supple.  Neurological: He is alert and oriented to person, place, and time.  Skin: Skin is warm and dry.  Psychiatric: He has a normal mood and affect. His behavior is normal. Thought content normal.  Nursing note and vitals reviewed.   Assessment & Plan:  1: Chronic heart failure with reduced ejection fraction- - NYHA class I - euvolemic today - hasn't been weighing daily as his scales got boxed up with recent move. Encouraged to resume weighing and to call for an overnight weight gain of >2 pounds or a weekly weight gain of >5 pounds - weight up 5 pounds since he was last here 3 months ago - not adding salt and has been reading food labels. Reviewed the importance of keeping sodium intake to 2000mg  daily.  - saw cardiology Sharolyn Douglas) 01/17/18 - his work had declined so had to move in with his brother - BNP 11/29/17 was 539.0 - PharmD reconciled medications with the patient  2: HTN- - BP good although on the low side - BP may not tolerate titration of entresto or carvedilol - BMP from 12/01/17 reviewed and shows sodium 132, potassium 4.2 and GFR >60 - says that he saw his PCP at [facility] a few months ago  3: DM- - nonfasting glucose at home was 129 - A1c on 05/31/17 was 7.6%, down from 9.9% back in August 2017  4:Tobacco use-  - smoking <1 ppd of cigarettes; says that he's been smoking more due to the stress of having to move - complete cessation discussed for 3 minutes with the patient  Patient did not bring his medications nor a list. Each medication was verbally reviewed with the patient and he was encouraged to bring the bottles to every visit to confirm accuracy of list.  Return in 6 months or sooner for any questions/problems before then.

## 2019-10-22 ENCOUNTER — Ambulatory Visit: Payer: Medicare PPO | Admitting: Family

## 2019-10-22 ENCOUNTER — Telehealth: Payer: Self-pay | Admitting: Family

## 2019-10-22 NOTE — Telephone Encounter (Signed)
Patient did not show for his [facility] Failure [facility] appointment on 10/22/19. Will attempt to reschedule.  

## 2019-10-30 ENCOUNTER — Ambulatory Visit: Payer: Medicare PPO | Admitting: Family

## 2019-10-30 NOTE — Progress Notes (Deleted)
Patient ID: Jorge Cruz, male    DOB: 09/15/1950, 69 y.o.   MRN: [id number]  HPI  Mr Holdren is a 69 y/o male with a history of DM, COPD, HTN, current tobacco use and chronic heart failure.  Echo report from 10/11/19 reviewed and showed an EF of 30-35% along with trivial MR. Echo report from 11/25/17 reviewed and showed an EF of 20-25% along with a mildly elevated PA pressure of 35 mm Hg. Echo report from 04/01/16 was reviewed and shows an EF of 20-25% along with mild MR.   Cardiac catheterization was done 06/01/17 which showed an EF of 20% along with mild nonobstructive CAD. RHC also done which showed normal filling pressures, normal pulmonary pressure and moderately reduced cardiac output. Cardiac output was 3.82 with a cardiac index of 1.83.  Admitted 10/10/19 due to HF/COPD exacerbation. Cardiology consult obtained. Had to be intubated and then successfully weaned. Initially given IV lasix and then transitioned to oral diuretics. Given steroids and bronchodilators. Elevated troponins thought to be due to demand ischemia. Discharged after 3 days. Was in the ED 08/25/19 due to wanting COVID test.   He presents today for a follow-up visit although hasn't been seen since August 2019. He presents with a chief complaint of   Past Medical History:  Diagnosis Date  . Chronic combined systolic (congestive) and diastolic (congestive) heart failure ([place])    a. 03/2014 Echo: EF 45%, glob HK; b. 03/2016 Echo: EF 20-25%, diff HK, Gr1 DD, mild MR; b. 11/2017 Echo: EF 20%, diff HK, Gr2 DD, mildly to mod reduced RV fxn, PASP 60mmHg.  . CKD (chronic kidney disease), stage III   . COPD (chronic obstructive pulmonary disease) ([place])   . Diabetes mellitus without complication ([place])   . Hypertensive heart disease   . NICM (nonischemic cardiomyopathy) ([place])    a. 03/2014 Echo: EF 45%, glob HK, mild conc LVH; b. 03/2014 MV: possible mild ischemia, superimposed on small inf infarct, EF 36%; c. 03/2016 MV: EF <30%, no ischemia;  d. 03/2016 Echo: EF 20-25%, diff HK, Gr1 DD, mild MR; e. 05/2017 Cath: nonobs dzs, EF 20%; f. 11/2017 Echo: EF 20%, diff HK, Gr2 DD.  . Non-obstructive CAD (coronary artery disease)    a. 05/2017 Cath: LM nl, LAD 61m, LCX 43m, RCA 60m, EF 20%.  . Polysubstance abuse ([place])   . Tobacco abuse    Past Surgical History:  Procedure Laterality Date  . RIGHT/LEFT HEART CATH AND CORONARY ANGIOGRAPHY N/A 06/01/2017   Procedure: RIGHT/LEFT HEART CATH AND CORONARY ANGIOGRAPHY;  Surgeon: Wellington Hampshire, MD;  Location: [place] [facility];  Service: Cardiovascular;  Laterality: N/A;   Family History  Problem Relation Age of Onset  . Diabetes Mother   . Diabetes Father    Social History   Tobacco Use  . Smoking status: Current Some Day Smoker    Packs/day: 0.25    Years: 50.00    Pack years: 12.50  . Smokeless tobacco: Never Used  Substance Use Topics  . Alcohol use: No   No Known Allergies    Review of Systems  Constitutional: Negative for appetite change and fatigue.  HENT: Positive for rhinorrhea. Negative for congestion and sore throat.   Eyes: Negative.   Respiratory: Negative for cough, chest tightness and shortness of breath.   Cardiovascular: Positive for chest pain (on occasion). Negative for palpitations and leg swelling.  Gastrointestinal: Negative for abdominal distention and abdominal pain.  Endocrine: Negative.   Genitourinary: Negative.  Musculoskeletal: Positive for neck pain (at times). Negative for back pain.  Skin: Negative.   Allergic/Immunologic: Negative.   Neurological: Positive for light-headedness (on occasion). Negative for dizziness.  Hematological: Negative for adenopathy. Does not bruise/bleed easily.  Psychiatric/Behavioral: Negative for dysphoric mood and sleep disturbance (sleeping on 2 pillows). The patient is not nervous/anxious.      Physical Exam  Constitutional: He is oriented to person, place, and time. He appears well-developed and  well-nourished.  HENT:  Head: Normocephalic and atraumatic.  Neck: No JVD present.  Cardiovascular: Normal rate and regular rhythm.  Pulmonary/Chest: Effort normal. He has no wheezes. He has no rales.  Abdominal: Soft. He exhibits no distension. There is no abdominal tenderness.  Musculoskeletal:        General: No tenderness or edema.     Cervical back: Normal range of motion and neck supple.  Neurological: He is alert and oriented to person, place, and time.  Skin: Skin is warm and dry.  Psychiatric: He has a normal mood and affect. His behavior is normal. Thought content normal.  Nursing note and vitals reviewed.   Assessment & Plan:  1: Chronic heart failure with reduced ejection fraction- - NYHA class I - euvolemic today - hasn't been weighing daily as his scales got boxed up with recent move. Encouraged to resume weighing and to call for an overnight weight gain of >2 pounds or a weekly weight gain of >5 pounds  - not adding salt and has been reading food labels. Reviewed the importance of keeping sodium intake to 2000mg  daily.  - had telemedicine visit with cardiology Excell Seltzer) 10/18/19 - scheduled for ICD implantation 12/05/19 - BNP 10/10/19 was 582.0   2: HTN- - BP - BMP from 10/13/19 reviewed and shows sodium 142, potassium 4.1, creatinine 1.11 and GFR >60 - had telemedicine visit with PCP ([facility]) 10/22/19  3: DM- - nonfasting glucose at home was  - A1c on 10/11/19 was 6.1%  4:Tobacco use-  - smoking <1 ppd of cigarettes; says that he's been smoking more due to the stress of having to move - complete cessation discussed for 3 minutes with the patient  Patient did not bring his medications nor a list. Each medication was verbally reviewed with the patient and he was encouraged to bring the bottles to every visit to confirm accuracy of list.

## 2019-10-31 ENCOUNTER — Ambulatory Visit: Payer: Medicare PPO | Admitting: Internal Medicine

## 2019-10-31 NOTE — Progress Notes (Signed)
Patient ID: Jorge Cruz, male    DOB: 1951/01/07, 69 y.o.   MRN: [id number]  HPI  Jorge Cruz is a 69 y/o male with a history of DM, COPD, HTN, current tobacco use and chronic heart failure.  Echo report from 10/11/19 reviewed and showed an EF of 30-35% along with trivial Jorge. Echo report from 11/25/17 reviewed and showed an EF of 20-25% along with a mildly elevated PA pressure of 35 mm Hg. Echo report from 04/01/16 was reviewed and shows an EF of 20-25% along with mild Jorge.   Cardiac catheterization was done 06/01/17 which showed an EF of 20% along with mild nonobstructive CAD. RHC also done which showed normal filling pressures, normal pulmonary pressure and moderately reduced cardiac output. Cardiac output was 3.82 with a cardiac index of 1.83.  Admitted 10/10/19 due to HF/COPD exacerbation. Cardiology consult obtained. Had to be intubated and then successfully weaned. Initially given IV lasix and then transitioned to oral diuretics. Given steroids and bronchodilators. Elevated troponins thought to be due to demand ischemia. Discharged after 3 days. Was in the ED 08/25/19 due to wanting COVID test.   He presents today for a follow-up visit although hasn't been seen since August 2019. He presents with a chief complaint of minimal shortness of breath upon moderate exertion. He describes this as chronic in nature having been present for several years. He has associated light-headedness and rhinorrhea along with this. He denies any difficulty sleeping, abdominal distention, palpitations, pedal edema, chest pain, cough or fatigue.   Scales are currently in storage in [place] and he doesn't have anyway of getting there to get them out. Was recently homeless January 2021 and now lives in a boarding house and takes the bus for transportation.   Past Medical History:  Diagnosis Date  . Chronic combined systolic (congestive) and diastolic (congestive) heart failure (HCC)    a. 03/2014 Echo: EF 45%, glob HK; b.  03/2016 Echo: EF 20-25%, diff HK, Gr1 DD, mild Jorge; b. 11/2017 Echo: EF 20%, diff HK, Gr2 DD, mildly to mod reduced RV fxn, PASP .  . CKD (chronic kidney disease), stage III   . COPD (chronic obstructive pulmonary disease) (HCC)   . Diabetes mellitus without complication (HCC)   . Hypertensive heart disease   . NICM (nonischemic cardiomyopathy) (HCC)    a. 03/2014 Echo: EF 45%, glob HK, mild conc LVH; b. 03/2014 MV: possible mild ischemia, superimposed on small inf infarct, EF 36%; c. 03/2016 MV: EF <30%, no ischemia; d. 03/2016 Echo: EF 20-25%, diff HK, Gr1 DD, mild Jorge; e. 05/2017 Cath: nonobs dzs, EF 20%; f. 11/2017 Echo: EF 20%, diff HK, Gr2 DD.  . Non-obstructive CAD (coronary artery disease)    a. 05/2017 Cath: LM nl, LAD 35m, LCX 85m, RCA 53m, EF 20%.  . Polysubstance abuse (HCC)   . Tobacco abuse    Past Surgical History:  Procedure Laterality Date  . RIGHT/LEFT HEART CATH AND CORONARY ANGIOGRAPHY N/A 06/01/2017   Procedure: RIGHT/LEFT HEART CATH AND CORONARY ANGIOGRAPHY;  Surgeon: Iran Ouch, MD;  Location: [facility] INVASIVE CV LAB;  Service: Cardiovascular;  Laterality: N/A;   Family History  Problem Relation Age of Onset  . Diabetes Mother   . Diabetes Father    Social History   Tobacco Use  . Smoking status: Current Some Day Smoker    Packs/day: 0.25    Years: 50.00    Pack years: 12.50  . Smokeless tobacco: Never Used  Substance Use Topics  .  Alcohol use: No   No Known Allergies  Prior to Admission medications   Medication Sig Start Date End Date Taking? Authorizing Provider  atorvastatin (LIPITOR) 80 MG tablet Take 80 mg by mouth daily.   Yes [provider]  carvedilol (COREG) 3.125 MG tablet Take 3.125 mg by mouth 2 (two) times daily with a meal.   Yes [provider]  furosemide (LASIX) 20 MG tablet Take 1 tablet (20 mg total) by mouth daily. 10/13/19  Yes Jennye Boroughs, MD  ipratropium-albuterol (DUONEB) 0.5-2.5 (3) MG/3ML SOLN Take 3 mLs by  nebulization every 4 (four) hours as needed. 12/01/17  Yes Dustin Flock, MD  metFORMIN (GLUCOPHAGE) 1000 MG tablet Take 1 tablet by mouth 2 (two) times daily.   Yes [provider]  nitroGLYCERIN (NITROSTAT) 0.4 MG SL tablet Place 0.4 mg under the tongue every 5 (five) minutes as needed for chest pain.   Yes [provider]  sacubitril-valsartan (ENTRESTO) 49-51 MG Take 1 tablet by mouth 2 (two) times daily. 01/31/18  Yes Theora Gianotti, NP  spironolactone (ALDACTONE) 25 MG tablet Take 1 tablet (25 mg total) by mouth daily. 10/13/19  Yes Jennye Boroughs, MD  albuterol (PROVENTIL HFA;VENTOLIN HFA) 108 (90 Base) MCG/ACT inhaler Inhale 2 puffs into the lungs every 6 (six) hours as needed for wheezing or shortness of breath.    [provider]  aspirin EC 81 MG tablet Take 1 tablet (81 mg total) by mouth daily. Patient not taking: Reported on 11/01/2019 04/01/16   Fritzi Mandes, MD     Review of Systems  Constitutional: Negative for appetite change and fatigue.  HENT: Positive for rhinorrhea. Negative for congestion and sore throat.   Eyes: Negative.   Respiratory: Positive for shortness of breath (with moderate exertion). Negative for cough and chest tightness.   Cardiovascular: Negative for chest pain, palpitations and leg swelling.  Gastrointestinal: Negative for abdominal distention and abdominal pain.  Endocrine: Negative.   Genitourinary: Negative.   Musculoskeletal: Positive for neck pain (at times). Negative for back pain.  Skin: Negative.   Allergic/Immunologic: Negative.   Neurological: Positive for light-headedness (on occasion). Negative for dizziness.  Hematological: Negative for adenopathy. Does not bruise/bleed easily.  Psychiatric/Behavioral: Negative for dysphoric mood and sleep disturbance (sleeping on 2 pillows). The patient is not nervous/anxious.    Vitals:   11/01/19 1331  BP: (!) 146/86  Pulse: 79  Resp: 18  SpO2: 99%  Weight: 202  lb 6.4 oz (91.8 kg)  Height: 5\' 7"  (1.702 m)   Wt Readings from Last 3 Encounters:  11/01/19 202 lb 6.4 oz (91.8 kg)  10/13/19 201 lb 9.6 oz (91.4 kg)  05/16/18 216 lb 8 oz (98.2 kg)   Lab Results  Component Value Date   CREATININE 1.11 10/13/2019   CREATININE 1.58 (H) 10/12/2019   CREATININE 1.84 (H) 10/11/2019     Physical Exam  Constitutional: He is oriented to person, place, and time. He appears well-developed and well-nourished.  HENT:  Head: Normocephalic and atraumatic.  Neck: No JVD present.  Cardiovascular: Normal rate and regular rhythm.  Pulmonary/Chest: Effort normal. He has no wheezes. He has no rales.  Abdominal: Soft. He exhibits no distension. There is no abdominal tenderness.  Musculoskeletal:        General: No tenderness or edema.     Cervical back: Normal range of motion and neck supple.  Neurological: He is alert and oriented to person, place, and time.  Skin: Skin is warm and dry.  Psychiatric: He has a normal mood and affect. His behavior is normal. Thought content normal.  Nursing note and vitals reviewed.   Assessment & Plan:  1: Chronic heart failure with reduced ejection fraction- - NYHA class II - euvolemic today - hasn't been weighing daily as his scales are in storage; will give him scales at his next appt - not adding salt and has been reading food labels. Reviewed the importance of keeping sodium intake to 2000mg  daily.  - will increase his entresto to 97/103mg  BID; RX sent to [facility] and he will continue his current dose until he receives the new one - will check BMP at his next visit - discussed titrating up his carvedilol and adding farxiga at future visits - had telemedicine visit with cardiology Jorge Cruz) 10/18/19 - to be scheduled for ICD implantation sometime April 2021 - BNP 10/10/19 was 582.0  2: HTN- - BP mildly elevated today; increasing entresto per above - BMP from 10/13/19 reviewed and shows sodium 142, potassium 4.1,  creatinine 1.11 and GFR >60 - had telemedicine visit with PCP ([facility]) 10/22/19  3: DM- - nonfasting glucose in clinic today was 74; since it was 1:00pm and patient has to take the bus home gave him some crackers and juice - A1c on 10/11/19 was 6.1%  4:Tobacco use-  - smoking 1/2 ppd of cigarettes - also has a couple of shots of liquor every "now and then" - last used cocaine ~ 3 months ago due to stress - complete cessation discussed for 3 minutes with the patient  Patient did not bring his medications nor a list. Each medication was verbally reviewed with the patient and he was encouraged to bring the bottles to every visit to confirm accuracy of list.  Return in 1 month or sooner for any questions/problems before then.

## 2019-10-31 NOTE — Progress Notes (Deleted)
Follow-up Outpatient Visit Date: 10/31/2019  Primary Care Provider: Gennette Pac, [address] [address]  Chief Complaint: ***  HPI:  Mr. Whitwell is a 69 y.o. male with history of chronic HFrEF secondary to nonischemic cardiomyopathy, nonobstructive CAD, hypertension, diabetes mellitus, chronic kidney disease stage III, COPD, polysubstance abuse, and medication noncompliance, who presents for follow-up of hospital follow-up for heart failure.  I last saw him in 2019, after which time he relocated to [facility] and was followed through [facility] cardiology.  He was hospitalized at [facility] last month with acute systolic heart failure after having returned to the [place] area.  He wishes to continue his cardiology follow-up with [place].  --------------------------------------------------------------------------------------------------  Past Medical History:  Diagnosis Date  . Chronic combined systolic (congestive) and diastolic (congestive) heart failure ([place])    a. 03/2014 Echo: EF 45%, glob HK; b. 03/2016 Echo: EF 20-25%, diff HK, Gr1 DD, mild MR; b. 11/2017 Echo: EF 20%, diff HK, Gr2 DD, mildly to mod reduced RV fxn, PASP 51mmHg.  . CKD (chronic kidney disease), stage III   . COPD (chronic obstructive pulmonary disease) ([place])   . Diabetes mellitus without complication ([place])   . Hypertensive heart disease   . NICM (nonischemic cardiomyopathy) ([place])    a. 03/2014 Echo: EF 45%, glob HK, mild conc LVH; b. 03/2014 MV: possible mild ischemia, superimposed on small inf infarct, EF 36%; c. 03/2016 MV: EF <30%, no ischemia; d. 03/2016 Echo: EF 20-25%, diff HK, Gr1 DD, mild MR; e. 05/2017 Cath: nonobs dzs, EF 20%; f. 11/2017 Echo: EF 20%, diff HK, Gr2 DD.  . Non-obstructive CAD (coronary artery disease)    a. 05/2017 Cath: LM nl, LAD 4m, LCX 87m, RCA 71m, EF 20%.  . Polysubstance abuse ([place])   . Tobacco abuse    Past Surgical History:  Procedure Laterality Date  . RIGHT/LEFT HEART CATH AND  CORONARY ANGIOGRAPHY N/A 06/01/2017   Procedure: RIGHT/LEFT HEART CATH AND CORONARY ANGIOGRAPHY;  Surgeon: Wellington Hampshire, MD;  Location: [place] [facility];  Service: Cardiovascular;  Laterality: N/A;    Recent CV Pertinent Labs: Lab Results  Component Value Date   CHOL 151 04/01/2016   HDL 46 04/01/2016   LDLCALC 99 04/01/2016   TRIG 363 (H) 11/27/2017   CHOLHDL 3.3 04/01/2016   BNP 582.0 (H) 10/10/2019   K 4.1 10/13/2019   MG 2.2 10/13/2019   BUN 43 (H) 10/13/2019   BUN 26 05/16/2018   CREATININE 1.11 10/13/2019    Past medical and surgical history were reviewed and updated in EPIC.  No outpatient medications have been marked as taking for the 10/31/19 encounter (Appointment) with Nykerria Macconnell, Harrell Gave, MD.    Allergies: Patient has no known allergies.  Social History   Tobacco Use  . Smoking status: Current Some Day Smoker    Packs/day: 0.25    Years: 50.00    Pack years: 12.50  . Smokeless tobacco: Never Used  Substance Use Topics  . Alcohol use: No  . Drug use: No    Family History  Problem Relation Age of Onset  . Diabetes Mother   . Diabetes Father     Review of Systems: A 12-system review of systems was performed and was negative except as noted in the HPI.  --------------------------------------------------------------------------------------------------  Physical Exam: There were no vitals taken for this visit.  General:  *** HEENT: No conjunctival pallor or scleral icterus. Facemask in place. Neck: Supple without lymphadenopathy, thyromegaly, JVD, or HJR. Lungs: Normal work  of breathing. Clear to auscultation bilaterally without wheezes or crackles. Heart: Regular rate and rhythm without murmurs, rubs, or gallops. Non-displaced PMI. Abd: Bowel sounds present. Soft, NT/ND without hepatosplenomegaly Ext: No lower extremity edema. Radial, PT, and DP pulses are 2+ bilaterally. Skin: Warm and dry without rash.  EKG:  ***  Lab Results  Component  Value Date   WBC 11.4 (H) 10/12/2019   HGB 13.1 10/12/2019   HCT 39.0 10/12/2019   MCV 93.3 10/12/2019   PLT 243 10/12/2019    Lab Results  Component Value Date   NA 142 10/13/2019   K 4.1 10/13/2019   CL 107 10/13/2019   CO2 28 10/13/2019   BUN 43 (H) 10/13/2019   CREATININE 1.11 10/13/2019   GLUCOSE 167 (H) 10/13/2019   ALT 34 10/11/2019    Lab Results  Component Value Date   CHOL 151 04/01/2016   HDL 46 04/01/2016   LDLCALC 99 04/01/2016   TRIG 363 (H) 11/27/2017   CHOLHDL 3.3 04/01/2016    --------------------------------------------------------------------------------------------------  ASSESSMENT AND PLAN: [name] Dynasia Kercheval, MD 10/31/2019 7:31 AM

## 2019-11-01 ENCOUNTER — Other Ambulatory Visit: Payer: Self-pay

## 2019-11-01 ENCOUNTER — Encounter: Payer: Self-pay | Admitting: Internal Medicine

## 2019-11-01 ENCOUNTER — Ambulatory Visit: Payer: Medicare PPO | Attending: Family | Admitting: Family

## 2019-11-01 ENCOUNTER — Encounter: Payer: Self-pay | Admitting: Family

## 2019-11-01 VITALS — BP 146/86 | HR 79 | Resp 18 | Ht 67.0 in | Wt 202.4 lb

## 2019-11-01 DIAGNOSIS — I10 Essential (primary) hypertension: Secondary | ICD-10-CM

## 2019-11-01 DIAGNOSIS — I5022 Chronic systolic (congestive) heart failure: Secondary | ICD-10-CM

## 2019-11-01 DIAGNOSIS — Z79899 Other long term (current) drug therapy: Secondary | ICD-10-CM | POA: Insufficient documentation

## 2019-11-01 DIAGNOSIS — Z833 Family history of diabetes mellitus: Secondary | ICD-10-CM | POA: Insufficient documentation

## 2019-11-01 DIAGNOSIS — I130 Hypertensive heart and chronic kidney disease with heart failure and stage 1 through stage 4 chronic kidney disease, or unspecified chronic kidney disease: Secondary | ICD-10-CM | POA: Diagnosis not present

## 2019-11-01 DIAGNOSIS — F17210 Nicotine dependence, cigarettes, uncomplicated: Secondary | ICD-10-CM | POA: Diagnosis not present

## 2019-11-01 DIAGNOSIS — N183 Chronic kidney disease, stage 3 unspecified: Secondary | ICD-10-CM | POA: Insufficient documentation

## 2019-11-01 DIAGNOSIS — Z720 Tobacco use: Secondary | ICD-10-CM

## 2019-11-01 DIAGNOSIS — I5042 Chronic combined systolic (congestive) and diastolic (congestive) heart failure: Secondary | ICD-10-CM | POA: Insufficient documentation

## 2019-11-01 DIAGNOSIS — E1122 Type 2 diabetes mellitus with diabetic chronic kidney disease: Secondary | ICD-10-CM | POA: Diagnosis not present

## 2019-11-01 DIAGNOSIS — I2510 Atherosclerotic heart disease of native coronary artery without angina pectoris: Secondary | ICD-10-CM | POA: Insufficient documentation

## 2019-11-01 DIAGNOSIS — Z7984 Long term (current) use of oral hypoglycemic drugs: Secondary | ICD-10-CM | POA: Diagnosis not present

## 2019-11-01 DIAGNOSIS — J449 Chronic obstructive pulmonary disease, unspecified: Secondary | ICD-10-CM | POA: Insufficient documentation

## 2019-11-01 DIAGNOSIS — E119 Type 2 diabetes mellitus without complications: Secondary | ICD-10-CM

## 2019-11-01 LAB — GLUCOSE, CAPILLARY: Glucose-Capillary: 74 mg/dL (ref 70–99)

## 2019-11-01 MED ORDER — SACUBITRIL-VALSARTAN 97-103 MG PO TABS: 1.0000 | ORAL_TABLET | Freq: Two times a day (BID) | ORAL | 3 refills | Status: DC

## 2019-11-01 NOTE — Patient Instructions (Addendum)
Continue weighing daily and call for an overnight weight gain of > 2 pounds or a weekly weight gain of >5 pounds. 

## 2019-11-13 ENCOUNTER — Ambulatory Visit: Payer: Medicare PPO | Admitting: Family

## 2019-11-29 NOTE — Progress Notes (Deleted)
Patient ID: Jorge Cruz, male    DOB: 1951-07-23, 69 y.o.   MRN: [id number]  HPI  Jorge Cruz is a 69 y/o male with a history of DM, COPD, HTN, current tobacco use and chronic heart failure.  Echo report from 10/11/19 reviewed and showed an EF of 30-35% along with trivial Jorge. Echo report from 11/25/17 reviewed and showed an EF of 20-25% along with a mildly elevated PA pressure of 35 mm Hg. Echo report from 04/01/16 was reviewed and shows an EF of 20-25% along with mild Jorge.   Cardiac catheterization was done 06/01/17 which showed an EF of 20% along with mild nonobstructive CAD. RHC also done which showed normal filling pressures, normal pulmonary pressure and moderately reduced cardiac output. Cardiac output was 3.82 with a cardiac index of 1.83.  Admitted 10/10/19 due to HF/COPD exacerbation. Cardiology consult obtained. Had to be intubated and then successfully weaned. Initially given IV lasix and then transitioned to oral diuretics. Given steroids and bronchodilators. Elevated troponins thought to be due to demand ischemia. Discharged after 3 days. Was in the ED 08/25/19 due to wanting COVID test.   He presents today for a follow-up visit with a chief complaint of  Due to have ICD placed 12/05/19.  Past Medical History:  Diagnosis Date  . Chronic combined systolic (congestive) and diastolic (congestive) heart failure ([place])    a. 03/2014 Echo: EF 45%, glob HK; b. 03/2016 Echo: EF 20-25%, diff HK, Gr1 DD, mild Jorge; b. 11/2017 Echo: EF 20%, diff HK, Gr2 DD, mildly to mod reduced RV fxn, PASP 74mmHg.  . CKD (chronic kidney disease), stage III   . COPD (chronic obstructive pulmonary disease) ([place])   . Diabetes mellitus without complication ([place])   . Hypertensive heart disease   . NICM (nonischemic cardiomyopathy) ([place])    a. 03/2014 Echo: EF 45%, glob HK, mild conc LVH; b. 03/2014 MV: possible mild ischemia, superimposed on small inf infarct, EF 36%; c. 03/2016 MV: EF <30%, no ischemia; d. 03/2016 Echo: EF  20-25%, diff HK, Gr1 DD, mild Jorge; e. 05/2017 Cath: nonobs dzs, EF 20%; f. 11/2017 Echo: EF 20%, diff HK, Gr2 DD.  . Non-obstructive CAD (coronary artery disease)    a. 05/2017 Cath: LM nl, LAD 6m, LCX 28m, RCA 73m, EF 20%.  . Polysubstance abuse ([place])   . Tobacco abuse    Past Surgical History:  Procedure Laterality Date  . RIGHT/LEFT HEART CATH AND CORONARY ANGIOGRAPHY N/A 06/01/2017   Procedure: RIGHT/LEFT HEART CATH AND CORONARY ANGIOGRAPHY;  Surgeon: Wellington Hampshire, MD;  Location: [place] [facility];  Service: Cardiovascular;  Laterality: N/A;   Family History  Problem Relation Age of Onset  . Diabetes Mother   . Diabetes Father    Social History   Tobacco Use  . Smoking status: Current Some Day Smoker    Packs/day: 0.25    Years: 50.00    Pack years: 12.50  . Smokeless tobacco: Never Used  Substance Use Topics  . Alcohol use: No   No Known Allergies     Review of Systems  Constitutional: Negative for appetite change and fatigue.  HENT: Positive for rhinorrhea. Negative for congestion and sore throat.   Eyes: Negative.   Respiratory: Positive for shortness of breath (with moderate exertion). Negative for cough and chest tightness.   Cardiovascular: Negative for chest pain, palpitations and leg swelling.  Gastrointestinal: Negative for abdominal distention and abdominal pain.  Endocrine: Negative.   Genitourinary: Negative.   Musculoskeletal:  Positive for neck pain (at times). Negative for back pain.  Skin: Negative.   Allergic/Immunologic: Negative.   Neurological: Positive for light-headedness (on occasion). Negative for dizziness.  Hematological: Negative for adenopathy. Does not bruise/bleed easily.  Psychiatric/Behavioral: Negative for dysphoric mood and sleep disturbance (sleeping on 2 pillows). The patient is not nervous/anxious.        Physical Exam  Constitutional: He is oriented to person, place, and time. He appears well-developed and  well-nourished.  HENT:  Head: Normocephalic and atraumatic.  Neck: No JVD present.  Cardiovascular: Normal rate and regular rhythm.  Pulmonary/Chest: Effort normal. He has no wheezes. He has no rales.  Abdominal: Soft. He exhibits no distension. There is no abdominal tenderness.  Musculoskeletal:        General: No tenderness or edema.     Cervical back: Normal range of motion and neck supple.  Neurological: He is alert and oriented to person, place, and time.  Skin: Skin is warm and dry.  Psychiatric: He has a normal mood and affect. His behavior is normal. Thought content normal.  Nursing note and vitals reviewed.   Assessment & Plan:  1: Chronic heart failure with reduced ejection fraction- - NYHA class II - euvolemic today - scales given to him today; instructed to call for an overnight weight gain of >2 pounds or a weekly weight gain of >5 pounds - weight 202.4 from last visit here 1 month ago - not adding salt and has been reading food labels. Reviewed the importance of keeping sodium intake to 2000mg  daily.  - entresto increased to 97/103mg  BID at his last visit - will check BMP today - discussed titrating up his carvedilol and adding farxiga at future visits - had telemedicine visit with cardiology Excell Seltzer) 10/18/19 - ICD implantation to be done 12/05/19 - BNP 10/10/19 was 582.0  2: HTN- - BP  - BMP from 10/13/19 reviewed and shows sodium 142, potassium 4.1, creatinine 1.11 and GFR >60 - had telemedicine visit with PCP ([facility]) 10/22/19  3: DM- - nonfasting glucose in clinic today was  - A1c on 10/11/19 was 6.1%  4:Tobacco use-  - smoking 1/2 ppd of cigarettes - also has a couple of shots of liquor every "now and then" - last used cocaine ~ 3 months ago due to stress - complete cessation discussed for 3 minutes with the patient    Patient did not bring his medications nor a list. Each medication was verbally reviewed with the patient and he was encouraged  to bring the bottles to every visit to confirm accuracy of list.

## 2019-12-02 ENCOUNTER — Ambulatory Visit: Payer: Medicare PPO | Admitting: Family

## 2019-12-02 ENCOUNTER — Telehealth: Payer: Self-pay | Admitting: Family

## 2019-12-02 NOTE — Telephone Encounter (Signed)
Patient did not show for his Heart Failure Clinic appointment on 12/02/19. Will attempt to reschedule.

## 2020-01-02 DIAGNOSIS — Z95810 Presence of automatic (implantable) cardiac defibrillator: Secondary | ICD-10-CM

## 2020-01-02 HISTORY — DX: Presence of automatic (implantable) cardiac defibrillator: Z95.810

## 2020-01-08 ENCOUNTER — Other Ambulatory Visit: Payer: Self-pay | Admitting: Family

## 2020-01-08 DIAGNOSIS — I5022 Chronic systolic (congestive) heart failure: Secondary | ICD-10-CM

## 2020-01-08 MED ORDER — SACUBITRIL-VALSARTAN 97-103 MG PO TABS: 1.0000 | ORAL_TABLET | Freq: Two times a day (BID) | ORAL | 0 refills | Status: DC

## 2020-01-08 MED ORDER — CARVEDILOL 3.125 MG PO TABS: 3.1250 mg | ORAL_TABLET | Freq: Two times a day (BID) | ORAL | 0 refills | Status: DC

## 2020-04-07 ENCOUNTER — Telehealth: Payer: Self-pay

## 2020-04-07 NOTE — Telephone Encounter (Signed)
Received referral for initial lung cancer screening scan from Toy Cookey, NP.   Contacted patient and left message for him to call Glenna Fellows, lung navigator to learn more about the program.

## 2020-04-10 ENCOUNTER — Emergency Department: Payer: Medicare Other

## 2020-04-10 ENCOUNTER — Other Ambulatory Visit: Payer: Self-pay

## 2020-04-10 ENCOUNTER — Encounter: Payer: Self-pay | Admitting: Emergency Medicine

## 2020-04-10 DIAGNOSIS — I130 Hypertensive heart and chronic kidney disease with heart failure and stage 1 through stage 4 chronic kidney disease, or unspecified chronic kidney disease: Secondary | ICD-10-CM | POA: Insufficient documentation

## 2020-04-10 DIAGNOSIS — Z20822 Contact with and (suspected) exposure to covid-19: Secondary | ICD-10-CM | POA: Diagnosis not present

## 2020-04-10 DIAGNOSIS — I2510 Atherosclerotic heart disease of native coronary artery without angina pectoris: Secondary | ICD-10-CM | POA: Insufficient documentation

## 2020-04-10 DIAGNOSIS — M1611 Unilateral primary osteoarthritis, right hip: Secondary | ICD-10-CM | POA: Diagnosis not present

## 2020-04-10 DIAGNOSIS — N183 Chronic kidney disease, stage 3 unspecified: Secondary | ICD-10-CM | POA: Insufficient documentation

## 2020-04-10 DIAGNOSIS — Z7984 Long term (current) use of oral hypoglycemic drugs: Secondary | ICD-10-CM | POA: Insufficient documentation

## 2020-04-10 DIAGNOSIS — Z79899 Other long term (current) drug therapy: Secondary | ICD-10-CM | POA: Insufficient documentation

## 2020-04-10 DIAGNOSIS — Z7982 Long term (current) use of aspirin: Secondary | ICD-10-CM | POA: Diagnosis not present

## 2020-04-10 DIAGNOSIS — E1122 Type 2 diabetes mellitus with diabetic chronic kidney disease: Secondary | ICD-10-CM | POA: Insufficient documentation

## 2020-04-10 DIAGNOSIS — M25551 Pain in right hip: Secondary | ICD-10-CM | POA: Diagnosis present

## 2020-04-10 DIAGNOSIS — J449 Chronic obstructive pulmonary disease, unspecified: Secondary | ICD-10-CM | POA: Diagnosis not present

## 2020-04-10 DIAGNOSIS — I5042 Chronic combined systolic (congestive) and diastolic (congestive) heart failure: Secondary | ICD-10-CM | POA: Diagnosis not present

## 2020-04-10 LAB — CBC WITH DIFFERENTIAL/PLATELET
Abs Immature Granulocytes: 0.01 10*3/uL (ref 0.00–0.07)
Basophils Absolute: 0 10*3/uL (ref 0.0–0.1)
Basophils Relative: 0 %
Eosinophils Absolute: 0 10*3/uL (ref 0.0–0.5)
Eosinophils Relative: 1 %
HCT: 38.8 % — ABNORMAL LOW (ref 39.0–52.0)
Hemoglobin: 13.5 g/dL (ref 13.0–17.0)
Immature Granulocytes: 0 %
Lymphocytes Relative: 28 %
Lymphs Abs: 1.7 10*3/uL (ref 0.7–4.0)
MCH: 32.1 pg (ref 26.0–34.0)
MCHC: 34.8 g/dL (ref 30.0–36.0)
MCV: 92.4 fL (ref 80.0–100.0)
Monocytes Absolute: 0.8 10*3/uL (ref 0.1–1.0)
Monocytes Relative: 13 %
Neutro Abs: 3.6 10*3/uL (ref 1.7–7.7)
Neutrophils Relative %: 58 %
Platelets: 193 10*3/uL (ref 150–400)
RBC: 4.2 MIL/uL — ABNORMAL LOW (ref 4.22–5.81)
RDW: 14.9 % (ref 11.5–15.5)
WBC: 6.2 10*3/uL (ref 4.0–10.5)
nRBC: 0 % (ref 0.0–0.2)

## 2020-04-10 LAB — BASIC METABOLIC PANEL
Anion gap: 8 (ref 5–15)
BUN: 22 mg/dL (ref 8–23)
CO2: 26 mmol/L (ref 22–32)
Calcium: 9.6 mg/dL (ref 8.9–10.3)
Chloride: 103 mmol/L (ref 98–111)
Creatinine, Ser: 1.16 mg/dL (ref 0.61–1.24)
GFR calc Af Amer: >60 mL/min (ref >60)
GFR calc non Af Amer: >60 mL/min (ref >60)
Glucose, Bld: 158 mg/dL — ABNORMAL HIGH (ref 70–99)
Potassium: 4.3 mmol/L (ref 3.5–5.1)
Sodium: 137 mmol/L (ref 135–145)

## 2020-04-10 LAB — LACTIC ACID, PLASMA: Lactic Acid, Venous: 1 mmol/L (ref 0.5–1.9)

## 2020-04-10 LAB — TROPONIN I (HIGH SENSITIVITY): Troponin I (High Sensitivity): 37 ng/L — ABNORMAL HIGH (ref <18)

## 2020-04-10 NOTE — ED Triage Notes (Signed)
Patient presents to the ED with lower leg weakness x 2 days.  Patient states he feels like his right leg in particular, feels like rubber.  Patient denies history of similar symptoms in the past.  Patient denies pain.  Patient states he feels somewhat short of breath.  Patient also reports some right lower groin pain.  Patient states he has a hernia to that area.

## 2020-04-10 NOTE — ED Triage Notes (Signed)
Previous triage note was placed by this RN.

## 2020-04-11 ENCOUNTER — Emergency Department: Payer: Medicare Other

## 2020-04-11 ENCOUNTER — Observation Stay
Admission: EM | Admit: 2020-04-11 | Discharge: 2020-04-12 | Disposition: A | Discharge status: Home / Self Care | Payer: Medicare Other | Attending: Internal Medicine | Admitting: Internal Medicine

## 2020-04-11 ENCOUNTER — Encounter: Payer: Self-pay | Admitting: Internal Medicine

## 2020-04-11 DIAGNOSIS — I5022 Chronic systolic (congestive) heart failure: Secondary | ICD-10-CM

## 2020-04-11 DIAGNOSIS — N183 Chronic kidney disease, stage 3 unspecified: Secondary | ICD-10-CM | POA: Diagnosis not present

## 2020-04-11 DIAGNOSIS — I2510 Atherosclerotic heart disease of native coronary artery without angina pectoris: Secondary | ICD-10-CM | POA: Diagnosis present

## 2020-04-11 DIAGNOSIS — I5042 Chronic combined systolic (congestive) and diastolic (congestive) heart failure: Secondary | ICD-10-CM | POA: Diagnosis not present

## 2020-04-11 DIAGNOSIS — R7989 Other specified abnormal findings of blood chemistry: Secondary | ICD-10-CM | POA: Diagnosis present

## 2020-04-11 DIAGNOSIS — R778 Other specified abnormalities of plasma proteins: Secondary | ICD-10-CM | POA: Diagnosis present

## 2020-04-11 DIAGNOSIS — F17200 Nicotine dependence, unspecified, uncomplicated: Secondary | ICD-10-CM | POA: Diagnosis present

## 2020-04-11 DIAGNOSIS — M1611 Unilateral primary osteoarthritis, right hip: Secondary | ICD-10-CM

## 2020-04-11 DIAGNOSIS — I10 Essential (primary) hypertension: Secondary | ICD-10-CM | POA: Diagnosis present

## 2020-04-11 DIAGNOSIS — M25551 Pain in right hip: Secondary | ICD-10-CM | POA: Diagnosis present

## 2020-04-11 DIAGNOSIS — R002 Palpitations: Secondary | ICD-10-CM

## 2020-04-11 DIAGNOSIS — Z720 Tobacco use: Secondary | ICD-10-CM | POA: Diagnosis present

## 2020-04-11 DIAGNOSIS — I5023 Acute on chronic systolic (congestive) heart failure: Secondary | ICD-10-CM

## 2020-04-11 DIAGNOSIS — N1831 Chronic kidney disease, stage 3a: Secondary | ICD-10-CM | POA: Diagnosis present

## 2020-04-11 DIAGNOSIS — E119 Type 2 diabetes mellitus without complications: Secondary | ICD-10-CM

## 2020-04-11 DIAGNOSIS — R079 Chest pain, unspecified: Secondary | ICD-10-CM | POA: Insufficient documentation

## 2020-04-11 LAB — URINALYSIS, COMPLETE (UACMP) WITH MICROSCOPIC
Bacteria, UA: NONE SEEN
Bilirubin Urine: NEGATIVE
Glucose, UA: NEGATIVE mg/dL
Ketones, ur: NEGATIVE mg/dL
Leukocytes,Ua: NEGATIVE
Nitrite: NEGATIVE
Protein, ur: >=300 mg/dL — AB
Specific Gravity, Urine: 1.019 (ref 1.005–1.030)
pH: 5 (ref 5.0–8.0)

## 2020-04-11 LAB — SARS CORONAVIRUS 2 BY RT PCR (HOSPITAL ORDER, PERFORMED IN ~~LOC~~ HOSPITAL LAB): SARS Coronavirus 2: NEGATIVE

## 2020-04-11 LAB — TROPONIN I (HIGH SENSITIVITY)
Troponin I (High Sensitivity): 53 ng/L — ABNORMAL HIGH (ref <18)
Troponin I (High Sensitivity): 48 ng/L — ABNORMAL HIGH (ref <18)
Troponin I (High Sensitivity): 34 ng/L — ABNORMAL HIGH (ref <18)

## 2020-04-11 LAB — GLUCOSE, CAPILLARY
Glucose-Capillary: 181 mg/dL — ABNORMAL HIGH (ref 70–99)
Glucose-Capillary: 102 mg/dL — ABNORMAL HIGH (ref 70–99)
Glucose-Capillary: 216 mg/dL — ABNORMAL HIGH (ref 70–99)

## 2020-04-11 MED ORDER — PREDNISONE 10 MG PO TABS: 10.0000 mg | ORAL_TABLET | Freq: Every day | ORAL | Status: DC

## 2020-04-11 MED ORDER — ONDANSETRON HCL 4 MG PO TABS: 4.0000 mg | ORAL_TABLET | Freq: Four times a day (QID) | ORAL | Status: DC | PRN

## 2020-04-11 MED ORDER — PREDNISONE 20 MG PO TABS: 50.0000 mg | ORAL_TABLET | Freq: Every day | ORAL | Status: DC

## 2020-04-11 MED ORDER — ATORVASTATIN CALCIUM 20 MG PO TABS
80.0000 mg | ORAL_TABLET | Freq: Every day | ORAL | Status: DC
  Administered 2020-04-11 – 2020-04-12 (×2): 80 mg via ORAL
  Filled 2020-04-11 (×2): qty 4

## 2020-04-11 MED ORDER — ACETAMINOPHEN 500 MG PO TABS
1000.0000 mg | ORAL_TABLET | Freq: Once | ORAL | Status: AC
  Administered 2020-04-11: 1000 mg via ORAL
  Filled 2020-04-11: qty 2

## 2020-04-11 MED ORDER — SODIUM CHLORIDE 0.9 % IV SOLN: 250.0000 mL | INTRAVENOUS | Status: DC | PRN

## 2020-04-11 MED ORDER — PREDNISONE 20 MG PO TABS: 30.0000 mg | ORAL_TABLET | Freq: Every day | ORAL | Status: DC

## 2020-04-11 MED ORDER — PREDNISONE 20 MG PO TABS: 20.0000 mg | ORAL_TABLET | Freq: Every day | ORAL | Status: DC

## 2020-04-11 MED ORDER — PREDNISONE 5 MG (21) PO TBPK: 10.0000 mg | ORAL_TABLET | Freq: Every evening | ORAL | Status: DC

## 2020-04-11 MED ORDER — PREDNISONE 5 MG (21) PO TBPK: 5.0000 mg | ORAL_TABLET | Freq: Four times a day (QID) | ORAL | Status: DC

## 2020-04-11 MED ORDER — ACETAMINOPHEN 325 MG PO TABS: 650.0000 mg | ORAL_TABLET | Freq: Four times a day (QID) | ORAL | Status: DC | PRN

## 2020-04-11 MED ORDER — ASPIRIN EC 81 MG PO TBEC
81.0000 mg | DELAYED_RELEASE_TABLET | Freq: Every day | ORAL | Status: DC
  Administered 2020-04-11 – 2020-04-12 (×2): 81 mg via ORAL
  Filled 2020-04-11 (×2): qty 1

## 2020-04-11 MED ORDER — PREDNISONE 20 MG PO TABS
60.0000 mg | ORAL_TABLET | Freq: Once | ORAL | Status: AC
  Administered 2020-04-11: 60 mg via ORAL
  Filled 2020-04-11: qty 3

## 2020-04-11 MED ORDER — SODIUM CHLORIDE 0.9% FLUSH: 3.0000 mL | INTRAVENOUS | Status: DC | PRN

## 2020-04-11 MED ORDER — SACUBITRIL-VALSARTAN 97-103 MG PO TABS
1.0000 | ORAL_TABLET | Freq: Two times a day (BID) | ORAL | Status: DC
  Administered 2020-04-11 (×2): 1 via ORAL
  Filled 2020-04-11 (×5): qty 1

## 2020-04-11 MED ORDER — PREDNISONE 5 MG (21) PO TBPK
5.0000 mg | ORAL_TABLET | ORAL | Status: DC
Start: 2020-04-11 — End: 2020-04-11

## 2020-04-11 MED ORDER — INSULIN ASPART 100 UNIT/ML ~~LOC~~ SOLN
0.0000 [IU] | Freq: Three times a day (TID) | SUBCUTANEOUS | Status: DC
  Administered 2020-04-11 – 2020-04-12 (×2): 3 [IU] via SUBCUTANEOUS
  Filled 2020-04-11 (×2): qty 1

## 2020-04-11 MED ORDER — PREDNISONE 5 MG (21) PO TBPK
5.0000 mg | ORAL_TABLET | Freq: Three times a day (TID) | ORAL | Status: DC
Start: 2020-04-12 — End: 2020-04-11

## 2020-04-11 MED ORDER — PREDNISONE 20 MG PO TABS
50.0000 mg | ORAL_TABLET | Freq: Every day | ORAL | Status: AC
  Administered 2020-04-12: 50 mg via ORAL
  Filled 2020-04-11: qty 3

## 2020-04-11 MED ORDER — PREDNISONE 5 MG (21) PO TBPK
10.0000 mg | ORAL_TABLET | Freq: Every evening | ORAL | Status: DC
Start: 2020-04-11 — End: 2020-04-11

## 2020-04-11 MED ORDER — NITROGLYCERIN 0.4 MG SL SUBL: 0.4000 mg | SUBLINGUAL_TABLET | SUBLINGUAL | Status: DC | PRN

## 2020-04-11 MED ORDER — PREDNISONE 20 MG PO TABS: 40.0000 mg | ORAL_TABLET | Freq: Every day | ORAL | Status: DC

## 2020-04-11 MED ORDER — IPRATROPIUM-ALBUTEROL 0.5-2.5 (3) MG/3ML IN SOLN
3.0000 mL | Freq: Four times a day (QID) | RESPIRATORY_TRACT | Status: DC | PRN
  Administered 2020-04-11: 3 mL via RESPIRATORY_TRACT
  Filled 2020-04-11: qty 3

## 2020-04-11 MED ORDER — FUROSEMIDE 20 MG PO TABS
20.0000 mg | ORAL_TABLET | Freq: Every day | ORAL | Status: DC
  Administered 2020-04-11 – 2020-04-12 (×2): 20 mg via ORAL
  Filled 2020-04-11 (×2): qty 1

## 2020-04-11 MED ORDER — PREDNISONE 20 MG PO TABS: 60.0000 mg | ORAL_TABLET | Freq: Once | ORAL | Status: DC

## 2020-04-11 MED ORDER — CARVEDILOL 6.25 MG PO TABS
3.1250 mg | ORAL_TABLET | Freq: Two times a day (BID) | ORAL | Status: DC
  Administered 2020-04-11 – 2020-04-12 (×2): 3.125 mg via ORAL
  Filled 2020-04-11 (×2): qty 1

## 2020-04-11 MED ORDER — SODIUM CHLORIDE 0.9% FLUSH
3.0000 mL | Freq: Two times a day (BID) | INTRAVENOUS | Status: DC
  Administered 2020-04-12: 3 mL via INTRAVENOUS

## 2020-04-11 MED ORDER — PREDNISONE 5 MG (21) PO TBPK: 10.0000 mg | ORAL_TABLET | Freq: Every morning | ORAL | Status: DC

## 2020-04-11 MED ORDER — SPIRONOLACTONE 25 MG PO TABS
25.0000 mg | ORAL_TABLET | Freq: Every day | ORAL | Status: DC
  Administered 2020-04-11 – 2020-04-12 (×2): 25 mg via ORAL
  Filled 2020-04-11 (×3): qty 1

## 2020-04-11 MED ORDER — ACETAMINOPHEN 650 MG RE SUPP: 650.0000 mg | Freq: Four times a day (QID) | RECTAL | Status: DC | PRN

## 2020-04-11 MED ORDER — PREDNISONE 5 MG (21) PO TBPK: 5.0000 mg | ORAL_TABLET | ORAL | Status: DC

## 2020-04-11 MED ORDER — ASPIRIN 325 MG PO TABS
325.0000 mg | ORAL_TABLET | Freq: Every day | ORAL | Status: DC
  Administered 2020-04-11: 325 mg via ORAL
  Filled 2020-04-11: qty 1

## 2020-04-11 MED ORDER — ENOXAPARIN SODIUM 40 MG/0.4ML ~~LOC~~ SOLN
40.0000 mg | SUBCUTANEOUS | Status: DC
  Administered 2020-04-11: 40 mg via SUBCUTANEOUS
  Filled 2020-04-11: qty 0.4

## 2020-04-11 MED ORDER — ONDANSETRON HCL 4 MG/2ML IJ SOLN: 4.0000 mg | Freq: Four times a day (QID) | INTRAMUSCULAR | Status: DC | PRN

## 2020-04-11 MED ORDER — PREDNISONE 20 MG PO TABS: 10.0000 mg | ORAL_TABLET | Freq: Every day | ORAL | Status: DC

## 2020-04-11 NOTE — ED Provider Notes (Signed)
[facility] Emergency Department Provider Note  ____________________________________________  Time seen: Approximately 6:00 AM  I have reviewed the triage vital signs and the nursing notes.   HISTORY  Chief Complaint Extremity Weakness   HPI Jorge Cruz is a 69 y.o. male with a history of CHF with a EF of 20% status post AICD, chronic kidney disease, diabetes, COPD, polysubstance abuse who presents for evaluation of right hip pain.  Patient reports the pain started 1 or 2 months ago.  He reports that he was turning around to talk to his wife when he felt a pop on the hip.  Since then he has been having on and off pain however since yesterday the pain has been severe.  He reports mild to moderate pain at rest and severe when he tries to bear weight.  He reports that last night his hip gave way but he never fully fell as he was able to catch himself.  He denies any weakness or numbness of the leg, no back pain, no facial droop, no right upper extremity weakness or numbness, HA, or history of stroke.  He describes the pain as sharp, located in the anterior aspect of his right hip, constant and nonradiating.  He also reports that he has a inguinal hernia in that area however that is reducible.  Patient reports that yesterday he had a brief episode where he felt his flutter and felt short of breath. That lasted several minutes but resolved with no intervention. No CP.  Patient denies any current shortness of breath or fluttering.  Past Medical History:  Diagnosis Date  . Chronic combined systolic (congestive) and diastolic (congestive) heart failure (HCC)    a. 03/2014 Echo: EF 45%, glob HK; b. 03/2016 Echo: EF 20-25%, diff HK, Gr1 DD, mild MR; b. 11/2017 Echo: EF 20%, diff HK, Gr2 DD, mildly to mod reduced RV fxn, PASP .  . CKD (chronic kidney disease), stage III   . COPD (chronic obstructive pulmonary disease) (HCC)   . Diabetes mellitus without complication (HCC)    . Hypertensive heart disease   . NICM (nonischemic cardiomyopathy) (HCC)    a. 03/2014 Echo: EF 45%, glob HK, mild conc LVH; b. 03/2014 MV: possible mild ischemia, superimposed on small inf infarct, EF 36%; c. 03/2016 MV: EF <30%, no ischemia; d. 03/2016 Echo: EF 20-25%, diff HK, Gr1 DD, mild MR; e. 05/2017 Cath: nonobs dzs, EF 20%; f. 11/2017 Echo: EF 20%, diff HK, Gr2 DD.  . Non-obstructive CAD (coronary artery disease)    a. 05/2017 Cath: LM nl, LAD 64m, LCX 19m, RCA 96m, EF 20%.  . Polysubstance abuse (HCC)   . Tobacco abuse     Patient Active Problem List   Diagnosis Date Noted  . Acute on chronic respiratory failure (HCC) 10/10/2019  . Coronary artery disease involving native coronary artery of native heart without angina pectoris 05/17/2018  . Acute on chronic combined systolic and diastolic CHF (congestive heart failure) (HCC) 06/09/2017  . Tobacco use 06/09/2017  . NSTEMI (non-ST elevated myocardial infarction) (HCC) 05/31/2017  . COPD exacerbation (HCC) 04/01/2016  . Essential hypertension 04/01/2016  . HLD (hyperlipidemia) 04/01/2016  . Atypical chest pain 03/31/2016  . Elevated troponin 03/31/2016  . Diabetes (HCC) 03/31/2016    Past Surgical History:  Procedure Laterality Date  . RIGHT/LEFT HEART CATH AND CORONARY ANGIOGRAPHY N/A 06/01/2017   Procedure: RIGHT/LEFT HEART CATH AND CORONARY ANGIOGRAPHY;  Surgeon: Iran Ouch, MD;  Location: [facility] INVASIVE CV LAB;  Service: Cardiovascular;  Laterality: N/A;    Prior to Admission medications   Medication Sig Start Date End Date Taking? Authorizing Provider  albuterol (PROVENTIL HFA;VENTOLIN HFA) 108 (90 Base) MCG/ACT inhaler Inhale 2 puffs into the lungs every 6 (six) hours as needed for wheezing or shortness of breath.    [provider]  aspirin EC 81 MG tablet Take 1 tablet (81 mg total) by mouth daily. Patient not taking: Reported on 11/01/2019 04/01/16   Enedina Finner, MD  atorvastatin (LIPITOR) 80 MG tablet  Take 80 mg by mouth daily.    [provider]  carvedilol (COREG) 3.125 MG tablet Take 1 tablet (3.125 mg total) by mouth 2 (two) times daily with a meal. MUST MAKE APPT for further refills 01/08/20   Delma Freeze, FNP  furosemide (LASIX) 20 MG tablet Take 1 tablet (20 mg total) by mouth daily. 10/13/19   Lurene Shadow, MD  ipratropium-albuterol (DUONEB) 0.5-2.5 (3) MG/3ML SOLN Take 3 mLs by nebulization every 4 (four) hours as needed. 12/01/17   Auburn Bilberry, MD  metFORMIN (GLUCOPHAGE) 1000 MG tablet Take 1 tablet by mouth 2 (two) times daily.    [provider]  nitroGLYCERIN (NITROSTAT) 0.4 MG SL tablet Place 0.4 mg under the tongue every 5 (five) minutes as needed for chest pain.    [provider]  sacubitril-valsartan (ENTRESTO) 97-103 MG Take 1 tablet by mouth 2 (two) times daily. MUST MAKE APPT FOR FURTHER REFILLs 01/08/20   Clarisa Kindred A, FNP  spironolactone (ALDACTONE) 25 MG tablet Take 1 tablet (25 mg total) by mouth daily. 10/13/19   Lurene Shadow, MD    Allergies Patient has no known allergies.  Family History  Problem Relation Age of Onset  . Diabetes Mother   . Diabetes Father     Social History Social History   Tobacco Use  . Smoking status: Current Some Day Smoker    Packs/day: 0.25    Years: 50.00    Pack years: 12.50  . Smokeless tobacco: Never Used  Substance Use Topics  . Alcohol use: No  . Drug use: No    Review of Systems  Constitutional: Negative for fever. Eyes: Negative for visual changes. ENT: Negative for sore throat. Neck: No neck pain  Cardiovascular: Negative for chest pain. + fluttering Respiratory: + shortness of breath. Gastrointestinal: Negative for abdominal pain, vomiting or diarrhea. Genitourinary: Negative for dysuria. Musculoskeletal: Negative for back pain. + R hip pain Skin: Negative for rash. Neurological: Negative for headaches, weakness or numbness. Psych: No SI or  HI  ____________________________________________   PHYSICAL EXAM:  VITAL SIGNS: ED Triage Vitals  Enc Vitals Group     BP 04/10/20 1847 (!) 169/81     Pulse Rate 04/10/20 1847 99     Resp 04/10/20 1847 18     Temp 04/10/20 1847 98.54F     Temp Source 04/10/20 1847 Oral     SpO2 04/10/20 1847 99 %     Weight 04/10/20 1848 210 lb (95.3 kg)     Height 04/10/20 1848 5\' 7"  (1.702 m)     Head Circumference --      Peak Flow --      Pain Score 04/10/20 1847 8     Pain Loc --      Pain Edu? --      Excl. in GC? --     Constitutional: Alert and oriented. Well appearing and in no apparent distress. HEENT:      Head: Normocephalic  and atraumatic.         Eyes: Conjunctivae are normal. Sclera is non-icteric.       Mouth/Throat: Mucous membranes are moist.       Neck: Supple with no signs of meningismus. Cardiovascular: Regular rate and rhythm. No murmurs, gallops, or rubs.  Respiratory: Normal respiratory effort. Lungs are clear to auscultation bilaterally. Gastrointestinal: Soft, non tender, and non distended with positive bowel sounds. No rebound or guarding. Genitourinary: Patient has a right inguinal hernia that is soft and fully reducible Musculoskeletal: Strong and intact pulses on bilateral lower extremities, no bruising, there is no swelling, erythema, bruising of the right hip.  Patient does have pain with internal and external rotation of the hip.   Neurologic: Normal speech and language. Face is symmetric.  Normal dorsiflexion and plantarflexion of the left foot, patient is able to lift his leg by bending the knee but unable to lift up the leg from the bed due to pain.  When ambulating, patient is able to stand on both legs but is limping.  Skin: Skin is warm, dry and intact. No rash noted. Psychiatric: Mood and affect are normal. Speech and behavior are normal.  ____________________________________________   LABS (all labs ordered are listed, but only abnormal results are  displayed)  Labs Reviewed  BASIC METABOLIC PANEL - Abnormal; Notable for the following components:      Result Value   Glucose, Bld 158 (*)    All other components within normal limits  CBC WITH DIFFERENTIAL/PLATELET - Abnormal; Notable for the following components:   RBC 4.20 (*)    HCT 38.8 (*)    All other components within normal limits  TROPONIN I (HIGH SENSITIVITY) - Abnormal; Notable for the following components:   Troponin I (High Sensitivity) 37 (*)    All other components within normal limits  LACTIC ACID, PLASMA  URINALYSIS, COMPLETE (UACMP) WITH MICROSCOPIC  LACTIC ACID, PLASMA  TROPONIN I (HIGH SENSITIVITY)   ____________________________________________  EKG  ED ECG REPORT I, Nita Sickle, the attending physician, personally viewed and interpreted this ECG.  Normal sinus rhythm with rare PVCs, rate of 98, normal intervals, normal axis, no ST elevations or depressions.  Unchanged from prior. ____________________________________________  RADIOLOGY  I have personally reviewed the images performed during this visit and I agree with the Radiologist's read.   Interpretation by Radiologist:  DG Chest 2 View  Result Date: 04/10/2020 CLINICAL DATA:  Shortness of breath, lower extremity weakness for 2 days, fever EXAM: CHEST - 2 VIEW COMPARISON:  10/11/2019 FINDINGS: Frontal and lateral views of the chest demonstrate unremarkable cardiac silhouette. Dual lead pacemaker overlies left chest, proximal lead in the region the right atrium and distal lead in the region of the right ventricle. No airspace disease, effusion, or pneumothorax. No acute bony abnormalities. IMPRESSION: 1. No acute intrathoracic process. Electronically Signed   By: Sharlet Salina M.D.   On: 04/10/2020 19:22   CT Hip Right Wo Contrast  Result Date: 04/11/2020 CLINICAL DATA:  69 year old male with history of right-sided hip pain with lower leg weakness for the past 2 days. Suspected stress  fracture. EXAM: CT OF THE RIGHT HIP WITHOUT CONTRAST TECHNIQUE: Multidetector CT imaging of the right hip was performed according to the standard protocol. Multiplanar CT image reconstructions were also generated. COMPARISON:  No priors. FINDINGS: Bones/Joint/Cartilage No acute displaced fracture of the right hip or visualized right hemipelvis. No definite acute nondisplaced fracture identified. Right femoral head is located. Mild degenerative changes are noted  in the right hip joint where there is some joint space narrowing, subchondral sclerosis, subchondral cyst formation and osteophyte formation indicative of osteoarthritis. Ligaments Suboptimally assessed by CT. Muscles and Tendons Unremarkable. Soft tissues Small right inguinal hernia containing a short segment of small bowel. IMPRESSION: 1. No evidence of right hip fracture. 2. Mild right hip joint osteoarthritis. 3. Small right inguinal hernia containing short segment of small bowel. Electronically Signed   By: Trudie Reed M.D.   On: 04/11/2020 06:37   DG Hip Unilat W or Wo Pelvis 2-3 Views Right  Result Date: 04/11/2020 CLINICAL DATA:  Right hip pain EXAM: DG HIP (WITH OR WITHOUT PELVIS) 2-3V RIGHT COMPARISON:  None. FINDINGS: There is no evidence of hip fracture or dislocation. There is no evidence of arthropathy or other focal bone abnormality. IMPRESSION: Negative. Electronically Signed   By: Deatra Robinson M.D.   On: 04/11/2020 05:10     ____________________________________________   PROCEDURES  Procedure(s) performed:yes .1-3 Lead EKG Interpretation Performed by: Nita Sickle, MD Authorized by: Nita Sickle, MD     Interpretation: non-specific     ECG rate assessment: normal     Rhythm: sinus rhythm     Ectopy: none     Critical Care performed:  None ____________________________________________   INITIAL IMPRESSION / ASSESSMENT AND PLAN / ED COURSE  69 y.o. male with a history of CHF with a EF of 20% status  post AICD, chronic kidney disease, diabetes, COPD, polysubstance abuse who presents for evaluation of right hip pain x 2 months worse over the last 2 days.  Patient has tenderness with internal and external rotation of the hip but no swelling, bruising, erythema.  Limb is well warm and well-perfused.  He has a small right-sided inguinal hernia which is fully reducible and nontender on palpation.  Patient is able to lift up his leg by bending the knee but is unable to lift up the leg straight out of bed due to pain.  He is able to get up and bear weight as long as both legs are down on floor but does limp with ambulation.  Do not believe he had a stroke as his neuro exam is intact. No back pain, no signs of cauda equina syndrome. X-ray of the hip is unremarkable, confirmed by radiology.  CT has been ordered to rule out occult stress fracture versus osteomyelitis. Low suspicion for septic joint with no fever, no white count, normal lactic acid. Old medical records reviewed. Patient given tylenol pain with improvement of the pain.  Patient also describe a brief episode of fluttering associated with shortness of breath last night and which subsided without intervention.  He denies any chest pain, shortness of breath, fever, cough at this time.  He is compliant with all of his medications.  His EKG is unchanged from baseline.  Initial troponin is negative we will get a second one.  Chest x-ray with no acute findings, confirmed by radiology.  Patient is on telemetry being monitored for any signs of dysrhythmias.  Patient does look euvolemic.      _____________________________________________ Please note:  Patient was evaluated in Emergency Department today for the symptoms described in the history of present illness. Patient was evaluated in the context of the global COVID-19 pandemic, which necessitated consideration that the patient might be at risk for infection with the SARS-CoV-2 virus that causes COVID-19.  Institutional protocols and algorithms that pertain to the evaluation of patients at risk for COVID-19 are in a state of  rapid change based on information released by regulatory bodies including the CDC and federal and state organizations. These policies and algorithms were followed during the patient's care in the ED.  Some ED evaluations and interventions may be delayed as a result of limited staffing during the pandemic.   [place] Controlled Substance Database was reviewed by me. ____________________________________________   FINAL CLINICAL IMPRESSION(S) / ED DIAGNOSES   Final diagnoses:  Arthritis of right hip  Palpitations      NEW MEDICATIONS STARTED DURING THIS VISIT:  ED Discharge Orders    None       Note:  This document was prepared using Dragon voice recognition software and may include unintentional dictation errors.    Don Perking, [place], MD 04/12/20 [phone]

## 2020-04-11 NOTE — Consult Note (Signed)
ORTHOPAEDIC CONSULTATION  REQUESTING PHYSICIAN: Lucile Shutters, MD  Chief Complaint: Right hip pain  HPI: Jorge Cruz is a 69 y.o. male who complains of right hip pain for about a week without any known trauma.  He complains of pain in the right groin region and some laterally.  This is caused him to limp.  He does have a history of cardiac disease with stent angioplasty and congestive heart failure.  He has diabetes and kidney disease.  He has had plain x-rays and a CT scan of the right hip which does not show any fractures.  There is mild arthritis.  Past Medical History:  Diagnosis Date  . Chronic combined systolic (congestive) and diastolic (congestive) heart failure (HCC)    a. 03/2014 Echo: EF 45%, glob HK; b. 03/2016 Echo: EF 20-25%, diff HK, Gr1 DD, mild MR; b. 11/2017 Echo: EF 20%, diff HK, Gr2 DD, mildly to mod reduced RV fxn, PASP .  . CKD (chronic kidney disease), stage III   . CKD (chronic kidney disease), stage III   . COPD (chronic obstructive pulmonary disease) (HCC)   . Diabetes mellitus without complication (HCC)   . Hypertensive heart disease   . NICM (nonischemic cardiomyopathy) (HCC)    a. 03/2014 Echo: EF 45%, glob HK, mild conc LVH; b. 03/2014 MV: possible mild ischemia, superimposed on small inf infarct, EF 36%; c. 03/2016 MV: EF <30%, no ischemia; d. 03/2016 Echo: EF 20-25%, diff HK, Gr1 DD, mild MR; e. 05/2017 Cath: nonobs dzs, EF 20%; f. 11/2017 Echo: EF 20%, diff HK, Gr2 DD.  . Non-obstructive CAD (coronary artery disease)    a. 05/2017 Cath: LM nl, LAD 47m, LCX 45m, RCA 41m, EF 20%.  . Polysubstance abuse (HCC)   . Tobacco abuse    Past Surgical History:  Procedure Laterality Date  . RIGHT/LEFT HEART CATH AND CORONARY ANGIOGRAPHY N/A 06/01/2017   Procedure: RIGHT/LEFT HEART CATH AND CORONARY ANGIOGRAPHY;  Surgeon: Iran Ouch, MD;  Location: [facility] INVASIVE CV LAB;  Service: Cardiovascular;  Laterality: N/A;   Social History   Socioeconomic History   . Marital status: Married    Spouse name: Not on file  . Number of children: Not on file  . Years of education: Not on file  . Highest education level: Not on file  Occupational History  . Not on file  Tobacco Use  . Smoking status: Current Some Day Smoker    Packs/day: 0.25    Years: 50.00    Pack years: 12.50  . Smokeless tobacco: Never Used  Substance and Sexual Activity  . Alcohol use: No  . Drug use: No  . Sexual activity: Not Currently  Other Topics Concern  . Not on file  Social History Narrative  . Not on file   Social Determinants of Health   Financial Resource Strain:   . Difficulty of Paying Living Expenses: Not on file  Food Insecurity:   . Worried About Programme researcher, broadcasting/film/video in the Last Year: Not on file  . Ran Out of Food in the Last Year: Not on file  Transportation Needs:   . Lack of Transportation (Medical): Not on file  . Lack of Transportation (Non-Medical): Not on file  Physical Activity:   . Days of Exercise per Week: Not on file  . Minutes of Exercise per Session: Not on file  Stress:   . Feeling of Stress : Not on file  Social Connections:   . Frequency of Communication with Friends and Family:  Not on file  . Frequency of Social Gatherings with Friends and Family: Not on file  . Attends Religious Services: Not on file  . Active Member of Clubs or Organizations: Not on file  . Attends Banker Meetings: Not on file  . Marital Status: Not on file   Family History  Problem Relation Age of Onset  . Diabetes Mother   . Diabetes Father    No Known Allergies Prior to Admission medications   Medication Sig Start Date End Date Taking? Authorizing Provider  Fluticasone-Salmeterol (ADVAIR) 250-50 MCG/DOSE AEPB Inhale 1 puff into the lungs 2 (two) times daily. 03/17/20  Yes [provider]  furosemide (LASIX) 20 MG tablet Take 1 tablet (20 mg total) by mouth daily. 10/13/19  Yes Lurene Shadow, MD  metFORMIN (GLUCOPHAGE) 1000 MG  tablet Take 1,000 mg by mouth 2 (two) times daily.    Yes [provider]  albuterol (PROVENTIL HFA;VENTOLIN HFA) 108 (90 Base) MCG/ACT inhaler Inhale 2 puffs into the lungs every 6 (six) hours as needed for wheezing or shortness of breath.    [provider]  aspirin EC 81 MG tablet Take 1 tablet (81 mg total) by mouth daily. Patient not taking: Reported on 11/01/2019 04/01/16   Enedina Finner, MD  atorvastatin (LIPITOR) 80 MG tablet Take 80 mg by mouth daily.    [provider]  carvedilol (COREG) 3.125 MG tablet Take 1 tablet (3.125 mg total) by mouth 2 (two) times daily with a meal. MUST MAKE APPT for further refills 01/08/20   Clarisa Kindred A, FNP  ipratropium-albuterol (DUONEB) 0.5-2.5 (3) MG/3ML SOLN Take 3 mLs by nebulization every 4 (four) hours as needed. 12/01/17   Auburn Bilberry, MD  nitroGLYCERIN (NITROSTAT) 0.4 MG SL tablet Place 0.4 mg under the tongue every 5 (five) minutes as needed for chest pain.    [provider]  sacubitril-valsartan (ENTRESTO) 97-103 MG Take 1 tablet by mouth 2 (two) times daily. MUST MAKE APPT FOR FURTHER REFILLs 01/08/20   Clarisa Kindred A, FNP  spironolactone (ALDACTONE) 25 MG tablet Take 1 tablet (25 mg total) by mouth daily. 10/13/19   Lurene Shadow, MD   DG Chest 2 View  Result Date: 04/10/2020 CLINICAL DATA:  Shortness of breath, lower extremity weakness for 2 days, fever EXAM: CHEST - 2 VIEW COMPARISON:  10/11/2019 FINDINGS: Frontal and lateral views of the chest demonstrate unremarkable cardiac silhouette. Dual lead pacemaker overlies left chest, proximal lead in the region the right atrium and distal lead in the region of the right ventricle. No airspace disease, effusion, or pneumothorax. No acute bony abnormalities. IMPRESSION: 1. No acute intrathoracic process. Electronically Signed   By: Sharlet Salina M.D.   On: 04/10/2020 19:22   CT Hip Right Wo Contrast  Result Date: 04/11/2020 CLINICAL DATA:  69 year old male  with history of right-sided hip pain with lower leg weakness for the past 2 days. Suspected stress fracture. EXAM: CT OF THE RIGHT HIP WITHOUT CONTRAST TECHNIQUE: Multidetector CT imaging of the right hip was performed according to the standard protocol. Multiplanar CT image reconstructions were also generated. COMPARISON:  No priors. FINDINGS: Bones/Joint/Cartilage No acute displaced fracture of the right hip or visualized right hemipelvis. No definite acute nondisplaced fracture identified. Right femoral head is located. Mild degenerative changes are noted in the right hip joint where there is some joint space narrowing, subchondral sclerosis, subchondral cyst formation and osteophyte formation indicative of osteoarthritis. Ligaments Suboptimally assessed by CT. Muscles and Tendons Unremarkable. Soft  tissues Small right inguinal hernia containing a short segment of small bowel. IMPRESSION: 1. No evidence of right hip fracture. 2. Mild right hip joint osteoarthritis. 3. Small right inguinal hernia containing short segment of small bowel. Electronically Signed   By: Trudie Reed M.D.   On: 04/11/2020 06:37   DG Hip Unilat W or Wo Pelvis 2-3 Views Right  Result Date: 04/11/2020 CLINICAL DATA:  Right hip pain EXAM: DG HIP (WITH OR WITHOUT PELVIS) 2-3V RIGHT COMPARISON:  None. FINDINGS: There is no evidence of hip fracture or dislocation. There is no evidence of arthropathy or other focal bone abnormality. IMPRESSION: Negative. Electronically Signed   By: Deatra Robinson M.D.   On: 04/11/2020 05:10    Positive ROS: All other systems have been reviewed and were otherwise negative with the exception of those mentioned in the HPI and as above.  Physical Exam: General: Alert, no acute distress Cardiovascular: No pedal edema Respiratory: No cyanosis, no use of accessory musculature GI: No organomegaly, abdomen is soft and non-tender Skin: No lesions in the area of chief complaint Neurologic: Sensation  intact distally Psychiatric: Patient is competent for consent with normal mood and affect Lymphatic: No axillary or cervical lymphadenopathy  MUSCULOSKELETAL: The patient is alert and cooperative.  Is sitting up on the side of the bed eating lunch.  In the supine position he has good flexion of the hip.  There is mild pain with internal rotation to about 20 degrees and external rotation is normal.  The pain is in the groin region.  In addition he has mild tenderness over the greater trochanteric bursa.  There is no redness or increased warmth anywhere.  Straight leg raising is negative.  Neurovascular status good distally.  The back is nontender.    Assessment: Mild right hip arthritis and trochanteric bursitis  Plan: I would recommend a prednisone 10 mg Dosepak 48 tablets. Walker or crutches as needed to relieve weight on the leg. Return to my clinic in 7 to 10 days for reevaluation as needed.    Valinda Hoar, MD [phone]   04/11/2020 1:17 PM

## 2020-04-11 NOTE — ED Provider Notes (Signed)
I assumed care of this patient at approximately 0 700 today.  Please see outgoing provider's note for full details regarding their initial evaluation, assessment, and recommendations.  In brief patient presents for assessment of palpitations last night as well as chronic right hip pain.  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  Plan is to follow-up repeat troponin.  Repeat troponin is noted to be increasing.  In addition patient does have an isolated T wave inversion that is new when compared to prior.  Patient given ASA.  Admitted to hospital service in stable condition for further evaluation management.  Medications  aspirin EC tablet 81 mg (81 mg Oral Given 04/11/20 1448)  atorvastatin (LIPITOR) tablet 80 mg (80 mg Oral Given 04/11/20 1447)  carvedilol (COREG) tablet 3.125 mg (has no administration in time range)  furosemide (LASIX) tablet 20 mg (20 mg Oral Given 04/11/20 1447)  nitroGLYCERIN (NITROSTAT) SL tablet 0.4 mg (has no administration in time range)  sacubitril-valsartan (ENTRESTO) 97-103 mg per tablet (1 tablet Oral Given 04/11/20 1448)  spironolactone (ALDACTONE) tablet 25 mg (25 mg Oral Given 04/11/20 1447)  ipratropium-albuterol (DUONEB) 0.5-2.5 (3) MG/3ML nebulizer solution 3 mL (has no administration in time range)  enoxaparin (LOVENOX) injection 40 mg (has no administration in time range)  sodium chloride flush (NS) 0.9 % injection 3 mL (has no administration in time range)  sodium chloride flush (NS) 0.9 % injection 3 mL (has no administration in time range)  0.9 %  sodium chloride infusion (has no administration in time range)  ondansetron (ZOFRAN) tablet 4 mg (has no administration in time range)    Or  ondansetron (ZOFRAN) injection 4 mg (has no administration in time range)  acetaminophen (TYLENOL) tablet 650 mg (has no administration in time range)    Or   acetaminophen (TYLENOL) suppository 650 mg (has no administration in time range)  insulin aspart (novoLOG) injection 0-15 Units (3 Units Subcutaneous Given 04/11/20 1446)  predniSONE (DELTASONE) tablet 60 mg (60 mg Oral Given 04/11/20 1457)    Followed by  predniSONE (DELTASONE) tablet 50 mg (has no administration in time range)    Followed by  predniSONE (DELTASONE) tablet 40 mg (has no administration in time range)    Followed by  predniSONE (DELTASONE) tablet 30 mg (has no administration in time range)    Followed by  predniSONE (DELTASONE) tablet 20 mg (has no administration in time range)    Followed by  predniSONE (DELTASONE) tablet 10 mg (has no administration in time range)  acetaminophen (TYLENOL) tablet 1,000 mg (1,000 mg Oral Given 04/11/20 0453)      Gilles Chiquito, MD 04/11/20 [phone]

## 2020-04-11 NOTE — ED Notes (Signed)
Called pt several times no answer, RN walked around waiting room and walked outside

## 2020-04-11 NOTE — ED Notes (Signed)
PT reporting 2-3 days of bilateral lower extremity weakness (R) > (L).

## 2020-04-11 NOTE — ED Notes (Signed)
Pt c/o non-productive cough, agrees to breathing treatment.

## 2020-04-11 NOTE — Discharge Instructions (Addendum)
Your CT showed arthritis of your hip.  Take Tylenol 1000 mg every 8 hours for pain.  Follow-up with your primary care doctor.

## 2020-04-11 NOTE — ED Notes (Signed)
Son called- update given. Pt resting at this time- did not transfer call to pt.

## 2020-04-11 NOTE — H&P (Signed)
History and Physical    Jorge Cruz WIO:[id number] DOB: 09-14-1950 DOA: 04/11/2020  PCP: Toy Cookey, FNP   Patient coming from: Home  I have personally briefly reviewed patient's old medical records in [facility]  Chief Complaint: Pain in the right groin  HPI: Jorge Cruz is a 69 y.o. male with medical history significant for coronary artery disease status post stent angioplasty, history of CHF with LVEF of 20% status post AICD insertion, diabetes mellitus with complications of chronic kidney disease, nicotine dependence who presents to the ER for evaluation of pain in his right hip mostly in the groin area which he has had for about a month.  Patient states that he felt a pop in his right hip when he had turned abruptly and since then has been having pain intermittently but worse yesterday which is why he presented to the ER.  He states that he is unable to bear weight on the leg due to the pain and also has difficulty with ambulation due to the pain.  He describes the pain as a sharp pain which he rates an 8 x 10 in intensity at its worst.  It is constant and nonradiating.  He denies having any trauma. He denies having any chest pain, no shortness of breath, no nausea, no vomiting, no palpitations, no diaphoresis, no dizziness or lightheadedness. Lab results show sodium 137, potassium 4.3, bicarb 26, glucose 158, BUN 22, creatinine 1.16, troponin 37 >> 53>>48, white count 6.2, hemoglobin 13.5. Twelve-lead EKG reviewed by me shows sinus rhythm with PVCs. CT scan of the right hip without contrast shows mild right hip joint osteoarthritis.  No evidence of a fracture.  Small right inguinal hernia containing short segment of small bowel. Chest x-ray reviewed by me shows no infiltrate or effusion.   ED Course: Patient is a 69 year old African-American male with a history significant for coronary artery disease status post stent angioplasty, history of chronic systolic heart failure status  post AICD insertion who presents to the ER for evaluation of pain involving the right hip.  ER physician concerned about slightly elevated troponin levels and nonspecific EKG changes and requests observation to rule out a non-ST elevation MI.   Review of Systems: As per HPI otherwise 10 point review of systems negative.    Past Medical History:  Diagnosis Date  . Chronic combined systolic (congestive) and diastolic (congestive) heart failure (HCC)    a. 03/2014 Echo: EF 45%, glob HK; b. 03/2016 Echo: EF 20-25%, diff HK, Gr1 DD, mild MR; b. 11/2017 Echo: EF 20%, diff HK, Gr2 DD, mildly to mod reduced RV fxn, PASP .  . CKD (chronic kidney disease), stage III   . CKD (chronic kidney disease), stage III   . COPD (chronic obstructive pulmonary disease) (HCC)   . Diabetes mellitus without complication (HCC)   . Hypertensive heart disease   . NICM (nonischemic cardiomyopathy) (HCC)    a. 03/2014 Echo: EF 45%, glob HK, mild conc LVH; b. 03/2014 MV: possible mild ischemia, superimposed on small inf infarct, EF 36%; c. 03/2016 MV: EF <30%, no ischemia; d. 03/2016 Echo: EF 20-25%, diff HK, Gr1 DD, mild MR; e. 05/2017 Cath: nonobs dzs, EF 20%; f. 11/2017 Echo: EF 20%, diff HK, Gr2 DD.  . Non-obstructive CAD (coronary artery disease)    a. 05/2017 Cath: LM nl, LAD 46m, LCX 31m, RCA 74m, EF 20%.  . Polysubstance abuse (HCC)   . Tobacco abuse     Past Surgical History:  Procedure Laterality  Date  . RIGHT/LEFT HEART CATH AND CORONARY ANGIOGRAPHY N/A 06/01/2017   Procedure: RIGHT/LEFT HEART CATH AND CORONARY ANGIOGRAPHY;  Surgeon: Iran Ouch, MD;  Location: [facility] INVASIVE CV LAB;  Service: Cardiovascular;  Laterality: N/A;     reports that he has been smoking. He has a 12.50 pack-year smoking history. He has never used smokeless tobacco. He reports that he does not drink alcohol and does not use drugs.  No Known Allergies  Family History  Problem Relation Age of Onset  . Diabetes Mother   .  Diabetes Father      Prior to Admission medications   Medication Sig Start Date End Date Taking? Authorizing Provider  albuterol (PROVENTIL HFA;VENTOLIN HFA) 108 (90 Base) MCG/ACT inhaler Inhale 2 puffs into the lungs every 6 (six) hours as needed for wheezing or shortness of breath.    [provider]  aspirin EC 81 MG tablet Take 1 tablet (81 mg total) by mouth daily. Patient not taking: Reported on 11/01/2019 04/01/16   Enedina Finner, MD  atorvastatin (LIPITOR) 80 MG tablet Take 80 mg by mouth daily.    [provider]  carvedilol (COREG) 3.125 MG tablet Take 1 tablet (3.125 mg total) by mouth 2 (two) times daily with a meal. MUST MAKE APPT for further refills 01/08/20   Delma Freeze, FNP  furosemide (LASIX) 20 MG tablet Take 1 tablet (20 mg total) by mouth daily. 10/13/19   Lurene Shadow, MD  ipratropium-albuterol (DUONEB) 0.5-2.5 (3) MG/3ML SOLN Take 3 mLs by nebulization every 4 (four) hours as needed. 12/01/17   Auburn Bilberry, MD  metFORMIN (GLUCOPHAGE) 1000 MG tablet Take 1 tablet by mouth 2 (two) times daily.    [provider]  nitroGLYCERIN (NITROSTAT) 0.4 MG SL tablet Place 0.4 mg under the tongue every 5 (five) minutes as needed for chest pain.    [provider]  sacubitril-valsartan (ENTRESTO) 97-103 MG Take 1 tablet by mouth 2 (two) times daily. MUST MAKE APPT FOR FURTHER REFILLs 01/08/20   Clarisa Kindred A, FNP  spironolactone (ALDACTONE) 25 MG tablet Take 1 tablet (25 mg total) by mouth daily. 10/13/19   Lurene Shadow, MD    Physical Exam: Vitals:   04/11/20 0642 04/11/20 0930 04/11/20 1000 04/11/20 1100  BP:  135/71 (!) 150/78   Pulse:  76 75 72  Resp:      Temp: 99.4 F (37.4 C)     TempSrc: Oral     SpO2:  93% 93% 98%  Weight:      Height:         Vitals:   04/11/20 0642 04/11/20 0930 04/11/20 1000 04/11/20 1100  BP:  135/71 (!) 150/78   Pulse:  76 75 72  Resp:      Temp: 99.4 F (37.4 C)     TempSrc: Oral     SpO2:  93%  93% 98%  Weight:      Height:        Constitutional: NAD, alert and oriented x 3 Eyes: PERRL, lids and conjunctivae normal ENMT: Mucous membranes are moist.  Neck: normal, supple, no masses, no thyromegaly Respiratory: clear to auscultation bilaterally, no wheezing, no crackles. Normal respiratory effort. No accessory muscle use.  Cardiovascular: Regular rate and rhythm, no murmurs / rubs / gallops. No extremity edema. 2+ pedal pulses. No carotid bruits.  Abdomen: no tenderness, no masses palpated. No hepatosplenomegaly. Bowel sounds positive.  Musculoskeletal: no clubbing / cyanosis. No joint deformity upper and lower extremities. Decreased  range of motion right hip Skin: no rashes, lesions, ulcers.  Neurologic: No gross focal neurologic deficit. Psychiatric: Normal mood and affect.   Labs on Admission: I have personally reviewed following labs and imaging studies  CBC: Recent Labs  Lab 04/10/20 1850  WBC 6.2  NEUTROABS 3.6  HGB 13.5  HCT 38.8*  MCV 92.4  PLT 193   Basic Metabolic Panel: Recent Labs  Lab 04/10/20 1850  NA 137  K 4.3  CL 103  CO2 26  GLUCOSE 158*  BUN 22  CREATININE 1.16  CALCIUM 9.6   GFR: Estimated Creatinine Clearance: 67.1 mL/min (by C-G formula based on SCr of 1.16 mg/dL). Liver Function Tests: No results for input(s): AST, ALT, ALKPHOS, BILITOT, PROT, ALBUMIN in the last 168 hours. No results for input(s): LIPASE, AMYLASE in the last 168 hours. No results for input(s): AMMONIA in the last 168 hours. Coagulation Profile: No results for input(s): INR, PROTIME in the last 168 hours. Cardiac Enzymes: No results for input(s): CKTOTAL, CKMB, CKMBINDEX, TROPONINI in the last 168 hours. BNP (last 3 results) No results for input(s): PROBNP in the last 8760 hours. HbA1C: No results for input(s): HGBA1C in the last 72 hours. CBG: No results for input(s): GLUCAP in the last 168 hours. Lipid Profile: No results for input(s): CHOL, HDL, LDLCALC,  TRIG, CHOLHDL, LDLDIRECT in the last 72 hours. Thyroid Function Tests: No results for input(s): TSH, T4TOTAL, FREET4, T3FREE, THYROIDAB in the last 72 hours. Anemia Panel: No results for input(s): VITAMINB12, FOLATE, FERRITIN, TIBC, IRON, RETICCTPCT in the last 72 hours. Urine analysis:    Component Value Date/Time   COLORURINE YELLOW (A) 04/11/2020 0641   APPEARANCEUR HAZY (A) 04/11/2020 0641   LABSPEC 1.019 04/11/2020 0641   PHURINE 5.0 04/11/2020 0641   GLUCOSEU NEGATIVE 04/11/2020 0641   HGBUR MODERATE (A) 04/11/2020 0641   BILIRUBINUR NEGATIVE 04/11/2020 0641   KETONESUR NEGATIVE 04/11/2020 0641   PROTEINUR >=300 (A) 04/11/2020 0641   NITRITE NEGATIVE 04/11/2020 0641   LEUKOCYTESUR NEGATIVE 04/11/2020 0641    Radiological Exams on Admission: DG Chest 2 View  Result Date: 04/10/2020 CLINICAL DATA:  Shortness of breath, lower extremity weakness for 2 days, fever EXAM: CHEST - 2 VIEW COMPARISON:  10/11/2019 FINDINGS: Frontal and lateral views of the chest demonstrate unremarkable cardiac silhouette. Dual lead pacemaker overlies left chest, proximal lead in the region the right atrium and distal lead in the region of the right ventricle. No airspace disease, effusion, or pneumothorax. No acute bony abnormalities. IMPRESSION: 1. No acute intrathoracic process. Electronically Signed   By: Sharlet Salina M.D.   On: 04/10/2020 19:22   CT Hip Right Wo Contrast  Result Date: 04/11/2020 CLINICAL DATA:  69 year old male with history of right-sided hip pain with lower leg weakness for the past 2 days. Suspected stress fracture. EXAM: CT OF THE RIGHT HIP WITHOUT CONTRAST TECHNIQUE: Multidetector CT imaging of the right hip was performed according to the standard protocol. Multiplanar CT image reconstructions were also generated. COMPARISON:  No priors. FINDINGS: Bones/Joint/Cartilage No acute displaced fracture of the right hip or visualized right hemipelvis. No definite acute nondisplaced  fracture identified. Right femoral head is located. Mild degenerative changes are noted in the right hip joint where there is some joint space narrowing, subchondral sclerosis, subchondral cyst formation and osteophyte formation indicative of osteoarthritis. Ligaments Suboptimally assessed by CT. Muscles and Tendons Unremarkable. Soft tissues Small right inguinal hernia containing a short segment of small bowel. IMPRESSION: 1. No evidence of right hip  fracture. 2. Mild right hip joint osteoarthritis. 3. Small right inguinal hernia containing short segment of small bowel. Electronically Signed   By: Trudie Reed M.D.   On: 04/11/2020 06:37   DG Hip Unilat W or Wo Pelvis 2-3 Views Right  Result Date: 04/11/2020 CLINICAL DATA:  Right hip pain EXAM: DG HIP (WITH OR WITHOUT PELVIS) 2-3V RIGHT COMPARISON:  None. FINDINGS: There is no evidence of hip fracture or dislocation. There is no evidence of arthropathy or other focal bone abnormality. IMPRESSION: Negative. Electronically Signed   By: Deatra Robinson M.D.   On: 04/11/2020 05:10    EKG: Independently reviewed.  Sinus rhythm PVCs  Assessment/Plan Principal Problem:   Right hip pain Active Problems:   Elevated troponin   Essential hypertension   Chronic combined systolic (congestive) and diastolic (congestive) heart failure (HCC)   Tobacco use   Coronary artery disease involving native coronary artery of native heart without angina pectoris   CKD (chronic kidney disease), stage III    Right hip pain Unclear etiology and may be secondary to arthritis Patient complains of difficulty bearing weight on his right leg He does have a hernia which is reproducible We will request Ortho evaluation   History of coronary artery disease with mildly elevated troponin Patient denies having any chest pain or shortness of breath Troponin trend 37 >> 53 >> 48 Continue medical therapy with We will continue to monitor    Chronic combined systolic  and diastolic dysfunction CHF Stable not in acute exacerbation Last known LVEF of 20% Status post AICD insertion Continue Carvedilol, Entresto, furosemide and spironolactone Maintain low-sodium diet   Diabetes mellitus Maintain consistent carbohydrate diet Sliding scale coverage with insulin    Nicotine dependence Patient admits to smoking about a pack of cigarettes daily Smoking cessation was discussed with him in detail He declines a nicotine transdermal patch at this time     DVT prophylaxis: Lovenox Code Status: Full code Family Communication: Greater than 50% of time was spent discussing plan of care with patient at the bedside.  All questions and concerns have been addressed.  He verbalizes understanding and agrees with the plan. Disposition Plan: Back to previous home environment Consults called: Orthopedic surgery     Tochukwu Agbata MD [facility]     04/11/2020, 11:38 AM

## 2020-04-12 ENCOUNTER — Encounter: Payer: Self-pay | Admitting: Internal Medicine

## 2020-04-12 DIAGNOSIS — M25551 Pain in right hip: Secondary | ICD-10-CM

## 2020-04-12 DIAGNOSIS — M1611 Unilateral primary osteoarthritis, right hip: Secondary | ICD-10-CM | POA: Diagnosis not present

## 2020-04-12 LAB — URINE DRUG SCREEN, QUALITATIVE (ARMC ONLY)
Amphetamines, Ur Screen: NOT DETECTED
Barbiturates, Ur Screen: NOT DETECTED
Benzodiazepine, Ur Scrn: NOT DETECTED
Cannabinoid 50 Ng, Ur ~~LOC~~: NOT DETECTED
Cocaine Metabolite,Ur ~~LOC~~: NOT DETECTED
MDMA (Ecstasy)Ur Screen: NOT DETECTED
Methadone Scn, Ur: NOT DETECTED
Opiate, Ur Screen: NOT DETECTED
Phencyclidine (PCP) Ur S: NOT DETECTED
Tricyclic, Ur Screen: NOT DETECTED

## 2020-04-12 LAB — BASIC METABOLIC PANEL
Anion gap: 12 (ref 5–15)
BUN: 33 mg/dL — ABNORMAL HIGH (ref 8–23)
CO2: 24 mmol/L (ref 22–32)
Calcium: 9.6 mg/dL (ref 8.9–10.3)
Chloride: 99 mmol/L (ref 98–111)
Creatinine, Ser: 1.33 mg/dL — ABNORMAL HIGH (ref 0.61–1.24)
GFR calc Af Amer: >60 mL/min (ref >60)
GFR calc non Af Amer: 55 mL/min — ABNORMAL LOW (ref >60)
Glucose, Bld: 130 mg/dL — ABNORMAL HIGH (ref 70–99)
Potassium: 4.5 mmol/L (ref 3.5–5.1)
Sodium: 135 mmol/L (ref 135–145)

## 2020-04-12 LAB — GLUCOSE, CAPILLARY: Glucose-Capillary: 151 mg/dL — ABNORMAL HIGH (ref 70–99)

## 2020-04-12 LAB — CBC
HCT: 40.1 % (ref 39.0–52.0)
Hemoglobin: 13.5 g/dL (ref 13.0–17.0)
MCH: 31.8 pg (ref 26.0–34.0)
MCHC: 33.7 g/dL (ref 30.0–36.0)
MCV: 94.4 fL (ref 80.0–100.0)
Platelets: 185 10*3/uL (ref 150–400)
RBC: 4.25 MIL/uL (ref 4.22–5.81)
RDW: 14.3 % (ref 11.5–15.5)
WBC: 8 10*3/uL (ref 4.0–10.5)
nRBC: 0 % (ref 0.0–0.2)

## 2020-04-12 LAB — HEMOGLOBIN A1C
Hgb A1c MFr Bld: 6 % — ABNORMAL HIGH (ref 4.8–5.6)
Mean Plasma Glucose: 125.5 mg/dL

## 2020-04-12 NOTE — Discharge Summary (Addendum)
Physician Discharge Summary  Jorge Cruz ELF:[id number] DOB: 06/12/1951 DOA: 04/11/2020  PCP: Toy Cookey, FNP  Admit date: 04/11/2020 Discharge date: 04/12/2020  Discharge disposition: Home   Recommendations for Outpatient Follow-Up:   Follow-up with orthopedic surgeon in 1 week   Discharge Diagnosis:   Principal Problem:   Right hip pain Active Problems:   Elevated troponin   Essential hypertension   Chronic combined systolic (congestive) and diastolic (congestive) heart failure (HCC)   Tobacco use   Coronary artery disease involving native coronary artery of native heart without angina pectoris   CKD (chronic kidney disease), stage III    Discharge Condition: Stable.  Diet recommendation:  Diet Order            Diet - low sodium heart healthy           Diet Carb Modified                       Code Status: Full Code     Hospital Course:   Jorge Cruz is a 69 y.o. male with medical history significant for coronary artery disease status post stent angioplasty, history of CHF with LVEF of 20% status post AICD insertion, diabetes mellitus with complications of chronic kidney disease, nicotine dependence who presented to the ER for evaluation of pain in his right hip mostly in the groin area which he has had for about a month.  Right hip pain progressively got worse in the few days preceding admission.  CT scan of the right hip showed changes suggestive of arthritis.  He was treated with analgesics and steroids.  He was evaluated by the orthopedic surgeon who had recommended steroid taper as an outpatient with outpatient follow-up.  The following day, his pain improved significantly.  He was able to bear weight on the right hip without any pain.  He actually jumped in his room during my visit to prove to me that he was feeling much better and ready to go home.  Because right hip pain had resolved at the time of discharge, he was not discharged on prednisone  taper.  Of note, patient was found to have mildly elevated troponins which were flat.  Chart review showed that patient has had chronically elevated troponins.  He did not have any chest pain or shortness of breath.  There were no new EKG changes or findings that were concerning.  EKG showed sinus rhythm with occasional PVCs, T wave inversions in inferior leads and leads V5 and V6 which are not new.  He is deemed stable for discharge to home today.      Discharge Exam:    Vitals:   04/12/20 0330 04/12/20 0400 04/12/20 0403 04/12/20 1025  BP: 129/63 (!) 147/72 (!) 146/73 134/87  Pulse: 75 72 71 77  Resp:   16 18  Temp:   98 F (36.7 C) 97.7 F (36.5 C)  TempSrc:   Oral   SpO2: 95% 95% 100% 100%  Weight:   93.4 kg   Height:   5\' 7"  (1.702 m)      GEN: NAD SKIN: No rash EYES: EOMI ENT: MMM CV: RRR PULM: CTA B ABD: soft, ND, NT, +BS CNS: AAO x 3, non focal EXT: No edema or tenderness.  No swelling, erythema or tenderness at the right hip.   The results of significant diagnostics from this hospitalization (including imaging, microbiology, ancillary and laboratory) are listed below for reference.     Procedures and  Diagnostic Studies:   CT Hip Right Wo Contrast  Result Date: 04/11/2020 CLINICAL DATA:  69 year old male with history of right-sided hip pain with lower leg weakness for the past 2 days. Suspected stress fracture. EXAM: CT OF THE RIGHT HIP WITHOUT CONTRAST TECHNIQUE: Multidetector CT imaging of the right hip was performed according to the standard protocol. Multiplanar CT image reconstructions were also generated. COMPARISON:  No priors. FINDINGS: Bones/Joint/Cartilage No acute displaced fracture of the right hip or visualized right hemipelvis. No definite acute nondisplaced fracture identified. Right femoral head is located. Mild degenerative changes are noted in the right hip joint where there is some joint space narrowing, subchondral sclerosis, subchondral cyst  formation and osteophyte formation indicative of osteoarthritis. Ligaments Suboptimally assessed by CT. Muscles and Tendons Unremarkable. Soft tissues Small right inguinal hernia containing a short segment of small bowel. IMPRESSION: 1. No evidence of right hip fracture. 2. Mild right hip joint osteoarthritis. 3. Small right inguinal hernia containing short segment of small bowel. Electronically Signed   By: Trudie Reed M.D.   On: 04/11/2020 06:37   DG Hip Unilat W or Wo Pelvis 2-3 Views Right  Result Date: 04/11/2020 CLINICAL DATA:  Right hip pain EXAM: DG HIP (WITH OR WITHOUT PELVIS) 2-3V RIGHT COMPARISON:  None. FINDINGS: There is no evidence of hip fracture or dislocation. There is no evidence of arthropathy or other focal bone abnormality. IMPRESSION: Negative. Electronically Signed   By: Deatra Robinson M.D.   On: 04/11/2020 05:10     Labs:   Basic Metabolic Panel: Recent Labs  Lab 04/10/20 1850 04/12/20 0818  NA 137 135  K 4.3 4.5  CL 103 99  CO2 26 24  GLUCOSE 158* 130*  BUN 22 33*  CREATININE 1.16 1.33*  CALCIUM 9.6 9.6   GFR Estimated Creatinine Clearance: 57.9 mL/min (A) (by C-G formula based on SCr of 1.33 mg/dL (H)). Liver Function Tests: No results for input(s): AST, ALT, ALKPHOS, BILITOT, PROT, ALBUMIN in the last 168 hours. No results for input(s): LIPASE, AMYLASE in the last 168 hours. No results for input(s): AMMONIA in the last 168 hours. Coagulation profile No results for input(s): INR, PROTIME in the last 168 hours.  CBC: Recent Labs  Lab 04/10/20 1850 04/12/20 0818  WBC 6.2 8.0  NEUTROABS 3.6  --   HGB 13.5 13.5  HCT 38.8* 40.1  MCV 92.4 94.4  PLT 193 185   Cardiac Enzymes: No results for input(s): CKTOTAL, CKMB, CKMBINDEX, TROPONINI in the last 168 hours. BNP: Invalid input(s): POCBNP CBG: Recent Labs  Lab 04/11/20 1437 04/11/20 1631 04/11/20 2126 04/12/20 0943  GLUCAP 181* 102* 216* 151*   D-Dimer No results for input(s): DDIMER in  the last 72 hours. Hgb A1c Recent Labs    04/11/20 1629  HGBA1C 6.0*   Lipid Profile No results for input(s): CHOL, HDL, LDLCALC, TRIG, CHOLHDL, LDLDIRECT in the last 72 hours. Thyroid function studies No results for input(s): TSH, T4TOTAL, T3FREE, THYROIDAB in the last 72 hours.  Invalid input(s): FREET3 Anemia work up No results for input(s): VITAMINB12, FOLATE, FERRITIN, TIBC, IRON, RETICCTPCT in the last 72 hours. Microbiology Recent Results (from the past 240 hour(s))  SARS Coronavirus 2 by RT PCR (hospital order, performed in [facility] hospital lab) Nasopharyngeal Nasopharyngeal Swab     Status: None   Collection Time: 04/11/20  8:20 AM   Specimen: Nasopharyngeal Swab  Result Value Ref Range Status   SARS Coronavirus 2 NEGATIVE NEGATIVE Final    Comment: (NOTE)  SARS-CoV-2 target nucleic acids are NOT DETECTED.  The SARS-CoV-2 RNA is generally detectable in upper and lower respiratory specimens during the acute phase of infection. The lowest concentration of SARS-CoV-2 viral copies this assay can detect is 250 copies / mL. A negative result does not preclude SARS-CoV-2 infection and should not be used as the sole basis for treatment or other patient management decisions.  A negative result may occur with improper specimen collection / handling, submission of specimen other than nasopharyngeal swab, presence of viral mutation(s) within the areas targeted by this assay, and inadequate number of viral copies (<250 copies / mL). A negative result must be combined with clinical observations, patient history, and epidemiological information.  Fact Sheet for Patients:   BoilerBrush.com.cy  Fact Sheet for Healthcare Providers: https://pope.com/  This test is not yet approved or  cleared by the [place] FDA and has been authorized for detection and/or diagnosis of SARS-CoV-2 by FDA under an Emergency Use Authorization  (EUA).  This EUA will remain in effect (meaning this test can be used) for the duration of the COVID-19 declaration under Section 564(b)(1) of the Act, 21 U.S.C. section 360bbb-3(b)(1), unless the authorization is terminated or revoked sooner.  Performed at [facility], [address], [place], [place] [address]      Discharge Instructions:   Discharge Instructions    Diet - low sodium heart healthy   Complete by: As directed    Diet Carb Modified   Complete by: As directed    Increase activity slowly   Complete by: As directed      Allergies as of 04/12/2020   No Known Allergies     Medication List    TAKE these medications   albuterol 108 (90 Base) MCG/ACT inhaler Commonly known as: VENTOLIN HFA Inhale 2 puffs into the lungs every 6 (six) hours as needed for wheezing or shortness of breath.   aspirin EC 81 MG tablet Take 1 tablet (81 mg total) by mouth daily.   atorvastatin 80 MG tablet Commonly known as: LIPITOR Take 80 mg by mouth daily.   carvedilol 3.125 MG tablet Commonly known as: COREG Take 1 tablet (3.125 mg total) by mouth 2 (two) times daily with a meal. MUST MAKE APPT for further refills   Fluticasone-Salmeterol 250-50 MCG/DOSE Aepb Commonly known as: ADVAIR Inhale 1 puff into the lungs 2 (two) times daily.   furosemide 20 MG tablet Commonly known as: LASIX Take 1 tablet (20 mg total) by mouth daily.   ipratropium-albuterol 0.5-2.5 (3) MG/3ML Soln Commonly known as: DUONEB Take 3 mLs by nebulization every 4 (four) hours as needed.   metFORMIN 1000 MG tablet Commonly known as: GLUCOPHAGE Take 1,000 mg by mouth 2 (two) times daily.   nitroGLYCERIN 0.4 MG SL tablet Commonly known as: NITROSTAT Place 0.4 mg under the tongue every 5 (five) minutes as needed for chest pain.   sacubitril-valsartan 97-103 MG Commonly known as: ENTRESTO Take 1 tablet by mouth 2 (two) times daily. MUST MAKE APPT FOR FURTHER REFILLs   spironolactone 25  MG tablet Commonly known as: ALDACTONE Take 1 tablet (25 mg total) by mouth daily.       Follow-up Information    Toy Cookey, FNP. Schedule an appointment as soon as possible for a visit in 2 days.   Specialty: Family Medicine Contact information: [address] [place] [address] [phone]        Delma Freeze, FNP. Schedule an appointment as soon as possible for  a visit in 2 days.   Specialty: Family Medicine Contact information: [address] [place] [address] [phone]                Time coordinating discharge: 32 minutes  Signed:  Lurene Shadow  [facility] 04/12/2020, 10:30 AM   Pager on [email or web address]. If 7PM-7AM, please contact night-coverage at [email or web address]

## 2020-04-12 NOTE — Progress Notes (Signed)
   04/12/20 1227  AVS Discharge Documentation  AVS Discharge Instructions Including Medications Provided to patient/caregiver  Name of Person Receiving AVS Discharge Instructions Including Medications Jorge Cruz  Name of Clinician That Reviewed AVS Discharge Instructions Including Medications Louretta Parma  The patient has been discharged IV has been removed. Education has performed with the patient and how to set up follow up appointments order by MD. No falls. Vital signs stable at discharge.

## 2020-04-12 NOTE — Plan of Care (Signed)

## 2020-04-12 NOTE — Progress Notes (Signed)
Physical Therapy Evaluation Patient Details Name: Jorge Cruz MRN: [id number] DOB: Jan 02, 1951 Today's Date: 04/12/2020   History of Present Illness  Per MD note;Jorge Cruz is a 69 y.o. male with medical history significant for coronary artery disease status post stent angioplasty, history of CHF with LVEF of 20% status post AICD insertion, diabetes mellitus with complications of chronic kidney disease, nicotine dependence who presents to the ER for evaluation of pain in his right hip mostly in the groin area which he has had for about a month.  Patient states that he felt a pop in his right hip when he had turned abruptly and since then has been having pain intermittently but worse yesterday which is why he presented to the ER.  He states that he is unable to bear weight on the leg due to the pain and also has difficulty with ambulation due to the pain.  He describes the pain as a sharp pain which he rates an 8 x 10 in intensity at its worst.  It is constant and nonradiating.  He denies having any trauma.  Clinical Impression  Patient agrees to PT evaluation. He reports no pain. He is independent with bed mobility, transfers and gait without AD 300 feet. He reports a level entrance to apartment and lives with his wife. He has no strength or balance deficits and no skilled PT needs at this time. He will not need any equipment or follow up PT.   Follow Up Recommendations No PT follow up    Equipment Recommendations  None recommended by PT    Recommendations for Other Services       Precautions / Restrictions Precautions Precautions: None Restrictions Weight Bearing Restrictions: No      Mobility  Bed Mobility Overal bed mobility: Independent                Transfers Overall transfer level: Independent Equipment used: None                Ambulation/Gait Ambulation/Gait assistance: Printmaker (Feet): 300 Feet Assistive device: None Gait  Pattern/deviations: Step-through pattern        Stairs            Wheelchair Mobility    Modified Rankin (Stroke Patients Only)       Balance Overall balance assessment: Independent                                           Pertinent Vitals/Pain Pain Assessment: No/denies pain    Home Living Family/patient expects to be discharged to:: Private residence Living Arrangements: Spouse/significant other Available Help at Discharge: Family Type of Home: Apartment Home Access: Level entry     Home Layout: One level Home Equipment: None      Prior Function Level of Independence: Independent               Hand Dominance        Extremity/Trunk Assessment   Upper Extremity Assessment Upper Extremity Assessment: Overall WFL for tasks assessed    Lower Extremity Assessment Lower Extremity Assessment: Overall WFL for tasks assessed       Communication   Communication: No difficulties  Cognition Arousal/Alertness: Awake/alert Behavior During Therapy: WFL for tasks assessed/performed Overall Cognitive Status: Within Functional Limits for tasks assessed  General Comments      Exercises     Assessment/Plan    PT Assessment Patent does not need any further PT services  PT Problem List         PT Treatment Interventions      PT Goals (Current goals can be found in the Care Plan section)  Acute Rehab PT Goals Patient Stated Goal: to go home PT Goal Formulation: All assessment and education complete, DC therapy    Frequency     Barriers to discharge        Co-evaluation               AM-PAC PT "6 Clicks" Mobility  Outcome Measure Help needed turning from your back to your side while in a flat bed without using bedrails?: None Help needed moving from lying on your back to sitting on the side of a flat bed without using bedrails?: None Help needed moving to and  from a bed to a chair (including a wheelchair)?: None Help needed standing up from a chair using your arms (e.g., wheelchair or bedside chair)?: None Help needed to walk in hospital room?: None Help needed climbing 3-5 steps with a railing? : None 6 Click Score: 24    End of Session Equipment Utilized During Treatment: Gait belt Activity Tolerance: Patient tolerated treatment well Patient left: in chair Nurse Communication: Mobility status PT Visit Diagnosis: Difficulty in walking, not elsewhere classified (R26.2)    Time: 1120-1130 PT Time Calculation (min) (ACUTE ONLY): 10 min   Charges:   PT Evaluation $PT Eval Low Complexity: 1 Low            Willards, Sharion Settler, PT DPT 04/12/2020, 11:46 AM

## 2020-04-14 ENCOUNTER — Other Ambulatory Visit: Payer: Self-pay

## 2020-04-14 ENCOUNTER — Encounter: Payer: Self-pay | Admitting: Family

## 2020-04-14 ENCOUNTER — Ambulatory Visit: Payer: Medicare Other | Attending: Family | Admitting: Family

## 2020-04-14 VITALS — BP 110/59 | HR 74 | Resp 20 | Ht 67.0 in | Wt 208.0 lb

## 2020-04-14 DIAGNOSIS — J449 Chronic obstructive pulmonary disease, unspecified: Secondary | ICD-10-CM | POA: Insufficient documentation

## 2020-04-14 DIAGNOSIS — I5022 Chronic systolic (congestive) heart failure: Secondary | ICD-10-CM

## 2020-04-14 DIAGNOSIS — F17210 Nicotine dependence, cigarettes, uncomplicated: Secondary | ICD-10-CM | POA: Insufficient documentation

## 2020-04-14 DIAGNOSIS — Z95810 Presence of automatic (implantable) cardiac defibrillator: Secondary | ICD-10-CM | POA: Diagnosis not present

## 2020-04-14 DIAGNOSIS — I428 Other cardiomyopathies: Secondary | ICD-10-CM | POA: Insufficient documentation

## 2020-04-14 DIAGNOSIS — E119 Type 2 diabetes mellitus without complications: Secondary | ICD-10-CM

## 2020-04-14 DIAGNOSIS — Z7982 Long term (current) use of aspirin: Secondary | ICD-10-CM | POA: Insufficient documentation

## 2020-04-14 DIAGNOSIS — I130 Hypertensive heart and chronic kidney disease with heart failure and stage 1 through stage 4 chronic kidney disease, or unspecified chronic kidney disease: Secondary | ICD-10-CM | POA: Insufficient documentation

## 2020-04-14 DIAGNOSIS — Z7984 Long term (current) use of oral hypoglycemic drugs: Secondary | ICD-10-CM | POA: Insufficient documentation

## 2020-04-14 DIAGNOSIS — Z7951 Long term (current) use of inhaled steroids: Secondary | ICD-10-CM | POA: Diagnosis not present

## 2020-04-14 DIAGNOSIS — I2510 Atherosclerotic heart disease of native coronary artery without angina pectoris: Secondary | ICD-10-CM | POA: Diagnosis not present

## 2020-04-14 DIAGNOSIS — Z79899 Other long term (current) drug therapy: Secondary | ICD-10-CM | POA: Diagnosis not present

## 2020-04-14 DIAGNOSIS — Z833 Family history of diabetes mellitus: Secondary | ICD-10-CM | POA: Insufficient documentation

## 2020-04-14 DIAGNOSIS — Z720 Tobacco use: Secondary | ICD-10-CM

## 2020-04-14 DIAGNOSIS — I10 Essential (primary) hypertension: Secondary | ICD-10-CM

## 2020-04-14 DIAGNOSIS — N183 Chronic kidney disease, stage 3 unspecified: Secondary | ICD-10-CM | POA: Insufficient documentation

## 2020-04-14 DIAGNOSIS — I5042 Chronic combined systolic (congestive) and diastolic (congestive) heart failure: Secondary | ICD-10-CM | POA: Insufficient documentation

## 2020-04-14 DIAGNOSIS — E1122 Type 2 diabetes mellitus with diabetic chronic kidney disease: Secondary | ICD-10-CM | POA: Diagnosis not present

## 2020-04-14 MED ORDER — SACUBITRIL-VALSARTAN 97-103 MG PO TABS: 1.0000 | ORAL_TABLET | Freq: Two times a day (BID) | ORAL | 5 refills | Status: DC

## 2020-04-14 NOTE — Progress Notes (Signed)
Patient ID: Jorge Cruz, male    DOB: 1951/06/08, 69 y.o.   MRN: [id number]  HPI  Mr Tu is a 69 y/o male with a history of DM, COPD, HTN, current tobacco use and chronic heart failure.  Echo report from 10/11/19 reviewed and showed an EF of 30-35% along with trivial MR. Echo report from 11/25/17 reviewed and showed an EF of 20-25% along with a mildly elevated PA pressure of 35 mm Hg. Echo report from 04/01/16 was reviewed and shows an EF of 20-25% along with mild MR.   Cardiac catheterization was done 06/01/17 which showed an EF of 20% along with mild nonobstructive CAD. RHC also done which showed normal filling pressures, normal pulmonary pressure and moderately reduced cardiac output. Cardiac output was 3.82 with a cardiac index of 1.83.  Admitted 04/11/20 due to right hip pain. Orthopaedic consult obtained. Hip CT showed changes suggestive of arthritis. Given analgesics and steroids. Discharged the following day.   He presents today for a follow-up visit with a chief complaint of minimal shortness of breath upon moderate exertion. He describes this as chronic in nature having been present for several years. He has associated fatigue, cough, intermittent chest pain, light-headedness and right hip pain along with this. He denies any difficulty sleeping, abdominal distention, palpitations, pedal edema, wheezing or weight gain.   Says that his right hip/groin pain is markedly improved from his recent admission.   Past Medical History:  Diagnosis Date  . Chronic combined systolic (congestive) and diastolic (congestive) heart failure (HCC)    a. 03/2014 Echo: EF 45%, glob HK; b. 03/2016 Echo: EF 20-25%, diff HK, Gr1 DD, mild MR; b. 11/2017 Echo: EF 20%, diff HK, Gr2 DD, mildly to mod reduced RV fxn, PASP .  . CKD (chronic kidney disease), stage III   . CKD (chronic kidney disease), stage III   . COPD (chronic obstructive pulmonary disease) (HCC)   . Diabetes mellitus without complication (HCC)    . Hypertensive heart disease   . NICM (nonischemic cardiomyopathy) (HCC)    a. 03/2014 Echo: EF 45%, glob HK, mild conc LVH; b. 03/2014 MV: possible mild ischemia, superimposed on small inf infarct, EF 36%; c. 03/2016 MV: EF <30%, no ischemia; d. 03/2016 Echo: EF 20-25%, diff HK, Gr1 DD, mild MR; e. 05/2017 Cath: nonobs dzs, EF 20%; f. 11/2017 Echo: EF 20%, diff HK, Gr2 DD.  . Non-obstructive CAD (coronary artery disease)    a. 05/2017 Cath: LM nl, LAD 35m, LCX 84m, RCA 3m, EF 20%.  . Polysubstance abuse (HCC)   . Tobacco abuse    Past Surgical History:  Procedure Laterality Date  . RIGHT/LEFT HEART CATH AND CORONARY ANGIOGRAPHY N/A 06/01/2017   Procedure: RIGHT/LEFT HEART CATH AND CORONARY ANGIOGRAPHY;  Surgeon: Iran Ouch, MD;  Location: [facility] INVASIVE CV LAB;  Service: Cardiovascular;  Laterality: N/A;   Family History  Problem Relation Age of Onset  . Diabetes Mother   . Diabetes Father    Social History   Tobacco Use  . Smoking status: Current Some Day Smoker    Packs/day: 0.25    Years: 50.00    Pack years: 12.50  . Smokeless tobacco: Never Used  Substance Use Topics  . Alcohol use: No   No Known Allergies  Prior to Admission medications   Medication Sig Start Date End Date Taking? Authorizing Provider  albuterol (PROVENTIL HFA;VENTOLIN HFA) 108 (90 Base) MCG/ACT inhaler Inhale 2 puffs into the lungs every 6 (six) hours as needed  for wheezing or shortness of breath.   Yes [provider]  aspirin EC 81 MG tablet Take 1 tablet (81 mg total) by mouth daily. 04/01/16  Yes Enedina Finner, MD  atorvastatin (LIPITOR) 80 MG tablet Take 80 mg by mouth daily.   Yes [provider]  carvedilol (COREG) 3.125 MG tablet Take 1 tablet (3.125 mg total) by mouth 2 (two) times daily with a meal. MUST MAKE APPT for further refills 01/08/20  Yes Keiden Deskin A, FNP  Fluticasone-Salmeterol (ADVAIR) 250-50 MCG/DOSE AEPB Inhale 1 puff into the lungs 2 (two) times daily.  03/17/20  Yes [provider]  furosemide (LASIX) 20 MG tablet Take 1 tablet (20 mg total) by mouth daily. 10/13/19  Yes Lurene Shadow, MD  ipratropium-albuterol (DUONEB) 0.5-2.5 (3) MG/3ML SOLN Take 3 mLs by nebulization every 4 (four) hours as needed. 12/01/17  Yes Auburn Bilberry, MD  metFORMIN (GLUCOPHAGE) 1000 MG tablet Take 1,000 mg by mouth 2 (two) times daily.    Yes [provider]  nitroGLYCERIN (NITROSTAT) 0.4 MG SL tablet Place 0.4 mg under the tongue every 5 (five) minutes as needed for chest pain.   Yes [provider]  sacubitril-valsartan (ENTRESTO) 97-103 MG Take 1 tablet by mouth 2 (two) times daily. 04/14/20  Yes Delma Freeze, FNP    Review of Systems  Constitutional: Positive for fatigue (at times). Negative for appetite change.  HENT: Positive for rhinorrhea. Negative for congestion and sore throat.   Eyes: Negative.   Respiratory: Positive for cough (dry ) and shortness of breath (with moderate exertion). Negative for chest tightness and wheezing.   Cardiovascular: Positive for chest pain ("burning under left breast at times"). Negative for palpitations and leg swelling.  Gastrointestinal: Negative for abdominal distention and abdominal pain.  Endocrine: Negative.   Genitourinary: Negative.   Musculoskeletal: Positive for arthralgias (right groin/ knee at times). Negative for back pain and neck pain.  Skin: Negative.   Allergic/Immunologic: Negative.   Neurological: Positive for light-headedness (on occasion). Negative for dizziness.  Hematological: Negative for adenopathy. Does not bruise/bleed easily.  Psychiatric/Behavioral: Negative for dysphoric mood and sleep disturbance (sleeping on 2 pillows). The patient is not nervous/anxious.    Vitals:   04/14/20 1032  BP: (!) 110/59  Pulse: 74  Resp: 20  SpO2: 100%  Weight: 208 lb (94.3 kg)  Height: 5\' 7"  (1.702 m)   Wt Readings from Last 3 Encounters:  04/14/20 208 lb (94.3 kg)   04/12/20 205 lb 14.6 oz (93.4 kg)  11/01/19 202 lb 6.4 oz (91.8 kg)   Lab Results  Component Value Date   CREATININE 1.33 (H) 04/12/2020   CREATININE 1.16 04/10/2020   CREATININE 1.11 10/13/2019    Physical Exam Vitals and nursing note reviewed.  Constitutional:      Appearance: He is well-developed.  HENT:     Head: Normocephalic and atraumatic.  Neck:     Vascular: No JVD.  Cardiovascular:     Rate and Rhythm: Normal rate and regular rhythm.  Pulmonary:     Effort: Pulmonary effort is normal.     Breath sounds: No wheezing or rales.  Abdominal:     General: There is no distension.     Palpations: Abdomen is soft.     Tenderness: There is no abdominal tenderness.  Musculoskeletal:        General: No tenderness.     Cervical back: Normal range of motion and neck supple.  Skin:    General: Skin is  warm and dry.  Neurological:     Mental Status: He is alert and oriented to person, place, and time.  Psychiatric:        Behavior: Behavior normal.        Thought Content: Thought content normal.     Assessment & Plan:  1: Chronic heart failure with reduced ejection fraction- - NYHA class II - euvolemic today - weighing himself but he feels like he needs to change the batteries; reminded to call for an overnight weight gain of >2 pounds or a weekly weight gain of >5 pounds - weight up 6 pounds since he was last here 5 months ago - not adding salt and has been reading food labels. Reviewed the importance of keeping sodium intake to 2000mg  daily.  - discussed titrating up his carvedilol and adding farxiga at future visits; patient prefers to wait  - had telemedicine visit with cardiology Excell Seltzer) 10/18/19 - had ICD implantation 01/02/20; denies any firing of ICD - BNP 10/10/19 was 582.0 - reports receiving both COVID vaccines  2: HTN- - BP looks good although on the low side - BMP from 04/12/20 reviewed and shows sodium 135, potassium 4.5, creatinine 1.33 and GFR >60 -  saw PCP ([facility]) earlier today  3: DM- - fasting glucose at home today was 132  - A1c on 04/11/20 was 6.0%  4:Tobacco use-  - smoking 1/2 ppd of cigarettes - also has a couple of shots of liquor every "now and then" - complete cessation discussed for 3 minutes with the patient   Patient did not bring his medications nor a list. Each medication was verbally reviewed with the patient and he was encouraged to bring the bottles to every visit to confirm accuracy of list.  Return in 6 months or sooner for any questions/problems before then.

## 2020-04-14 NOTE — Patient Instructions (Signed)
Continue weighing daily and call for an overnight weight gain of > 2 pounds or a weekly weight gain of >5 pounds. 

## 2020-05-07 ENCOUNTER — Telehealth: Payer: Self-pay

## 2020-05-07 NOTE — Telephone Encounter (Signed)
Contacted patient for lung CT screening program after receiving referral from Toy Cookey, FNP.  Patient agreeable to program and CT scheduled for Oct 12 at 1:45.  Address to imaging center and phone number to Glenna Fellows, lung navigator given to patient.  Patient is a current smoker and started smoking at age 69. He smokes almost a pack per day (about 90%) of a pack.

## 2020-05-08 ENCOUNTER — Other Ambulatory Visit: Payer: Self-pay | Admitting: *Deleted

## 2020-05-08 ENCOUNTER — Encounter: Payer: Self-pay | Admitting: *Deleted

## 2020-05-08 DIAGNOSIS — Z122 Encounter for screening for malignant neoplasm of respiratory organs: Secondary | ICD-10-CM

## 2020-05-08 DIAGNOSIS — Z87891 Personal history of nicotine dependence: Secondary | ICD-10-CM

## 2020-05-08 NOTE — Progress Notes (Signed)
Current smoker, 46.8 pack year

## 2020-06-02 ENCOUNTER — Other Ambulatory Visit: Payer: Self-pay

## 2020-06-02 ENCOUNTER — Inpatient Hospital Stay: Payer: Medicare Other | Attending: Nurse Practitioner | Admitting: Hospice and Palliative Medicine

## 2020-06-02 ENCOUNTER — Ambulatory Visit
Admission: RE | Admit: 2020-06-02 | Discharge: 2020-06-02 | Disposition: A | Discharge status: Home / Self Care | Payer: Medicare Other | Source: Ambulatory Visit | Attending: Nurse Practitioner | Admitting: Nurse Practitioner

## 2020-06-02 DIAGNOSIS — Z87891 Personal history of nicotine dependence: Secondary | ICD-10-CM | POA: Insufficient documentation

## 2020-06-02 DIAGNOSIS — Z122 Encounter for screening for malignant neoplasm of respiratory organs: Secondary | ICD-10-CM | POA: Diagnosis not present

## 2020-06-02 DIAGNOSIS — F17210 Nicotine dependence, cigarettes, uncomplicated: Secondary | ICD-10-CM | POA: Diagnosis not present

## 2020-06-02 NOTE — Progress Notes (Signed)
Virtual Visit via Video Note  I connected with@ on 06/02/20 at@ by a video enabled telemedicine application and verified that I am speaking with the correct person using two identifiers.   I discussed the limitations of evaluation and management by telemedicine and the availability of in person appointments. The patient expressed understanding and agreed to proceed.  In accordance with CMS guidelines, patient has met eligibility criteria including age, absence of signs or symptoms of lung cancer.  Social History   Tobacco Use  . Smoking status: Current Some Day Smoker    Packs/day: 0.90    Years: 52.00    Pack years: 46.80    Types: Cigarettes  . Smokeless tobacco: Never Used  Vaping Use  . Vaping Use: Never used  Substance Use Topics  . Alcohol use: No  . Drug use: No    Comment: 2 years ago      A shared decision-making session was conducted prior to the performance of CT scan. This includes one or more decision aids, includes benefits and harms of screening, follow-up diagnostic testing, over-diagnosis, false positive rate, and total radiation exposure.   Counseling on the importance of adherence to annual lung cancer LDCT screening, impact of co-morbidities, and ability or willingness to undergo diagnosis and treatment is imperative for compliance of the program.   Counseling on the importance of continued smoking cessation for former smokers; the importance of smoking cessation for current smokers, and information about tobacco cessation interventions have been given to patient including [place] [facility] and [facility] quit [place] programs.   Written order for lung cancer screening with LDCT has been given to the patient and any and all questions have been answered to the best of my abilities.    Yearly follow up will be coordinated by Shawn Perkins, Thoracic Navigator.  Time Total: 15 minutes  Visit consisted of counseling and education dealing with complex health screening.  Greater than 50%  of this time was spent counseling and coordinating care related to the above assessment and plan.  Signed by: Joshua Borders, PhD, NP-C 

## 2020-06-07 ENCOUNTER — Encounter: Payer: Self-pay | Admitting: *Deleted

## 2020-10-15 ENCOUNTER — Ambulatory Visit: Payer: Medicare Other | Admitting: Family

## 2020-10-21 NOTE — Progress Notes (Signed)
Patient ID: Jorge Cruz, male    DOB: 03-23-1951, 70 y.o.   MRN: [id number]  HPI  [name] is a 70 y/o male with a history of DM, COPD, HTN, current tobacco use and chronic heart failure.  Echo report from 07/06/20 reviewed and showed an EF of 25%. Echo report from 10/11/19 reviewed and showed an EF of 30-35% along with trivial MR. Echo report from 11/25/17 reviewed and showed an EF of 20-25% along with a mildly elevated PA pressure of 35 mm Hg. Echo report from 04/01/16 was reviewed and shows an EF of 20-25% along with mild MR.   Cardiac catheterization was done 06/01/17 which showed an EF of 20% along with mild nonobstructive CAD. RHC also done which showed normal filling pressures, normal pulmonary pressure and moderately reduced cardiac output. Cardiac output was 3.82 with a cardiac index of 1.83.  Admitted 07/05/20 due to respiratory failure along with acute on chronic heart failure and suspected stroke. Diuretic adjusted. Discharged after 5 days.   He presents today for a follow-up visit with a chief complaint of minimal fatigue upon moderate exertion. He describes this as chronic in nature having been present for several years. He has associated cough and shortness of breath along with this. He denies any difficulty sleeping, dizziness, abdominal distention, palpitations, pedal edema, chest pain, wheezing or weight gain.   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   Past Medical History:  Diagnosis Date  . Chronic combined systolic (congestive) and diastolic (congestive) heart failure (HCC)    a. 03/2014 Echo: EF 45%, glob HK; b. 03/2016 Echo: EF 20-25%, diff HK, Gr1 DD, mild MR; b. 11/2017 Echo: EF 20%, diff HK, Gr2 DD, mildly to mod reduced RV fxn, PASP .  . CKD (chronic kidney disease), stage III   . CKD (chronic kidney disease), stage III   . COPD (chronic obstructive pulmonary disease) (HCC)   . Diabetes mellitus without  complication (HCC)   . Hypertensive heart disease   . NICM (nonischemic cardiomyopathy) (HCC)    a. 03/2014 Echo: EF 45%, glob HK, mild conc LVH; b. 03/2014 MV: possible mild ischemia, superimposed on small inf infarct, EF 36%; c. 03/2016 MV: EF <30%, no ischemia; d. 03/2016 Echo: EF 20-25%, diff HK, Gr1 DD, mild MR; e. 05/2017 Cath: nonobs dzs, EF 20%; f. 11/2017 Echo: EF 20%, diff HK, Gr2 DD.  . Non-obstructive CAD (coronary artery disease)    a. 05/2017 Cath: LM nl, LAD 32m, LCX 37m, RCA 31m, EF 20%.  . Polysubstance abuse (HCC)   . Tobacco abuse    Past Surgical History:  Procedure Laterality Date  . RIGHT/LEFT HEART CATH AND CORONARY ANGIOGRAPHY N/A 06/01/2017   Procedure: RIGHT/LEFT HEART CATH AND CORONARY ANGIOGRAPHY;  Surgeon: Iran Ouch, MD;  Location: [facility] INVASIVE CV LAB;  Service: Cardiovascular;  Laterality: N/A;   Family History  Problem Relation Age of Onset  . Diabetes Mother   . Diabetes Father    Social History   Tobacco Use  . Smoking status: Current Some Day Smoker    Packs/day: 0.90    Years: 52.00    Pack years: 46.80    Types: Cigarettes  . Smokeless tobacco: Never Used  Substance Use Topics  . Alcohol use: No   No Known Allergies  Prior to Admission medications   Medication Sig Start Date End Date Taking? Authorizing Provider  albuterol (PROVENTIL HFA;VENTOLIN HFA) 108 (90 Base) MCG/ACT inhaler Inhale  2 puffs into the lungs every 6 (six) hours as needed for wheezing or shortness of breath.   Yes [provider]  aspirin EC 81 MG tablet Take 1 tablet (81 mg total) by mouth daily. 04/01/16  Yes Enedina Finner, MD  atorvastatin (LIPITOR) 80 MG tablet Take 80 mg by mouth daily.   Yes [provider]  carvedilol (COREG) 3.125 MG tablet Take 1 tablet (3.125 mg total) by mouth 2 (two) times daily with a meal. MUST MAKE APPT for further refills 01/08/20  Yes Kazue Cerro A, FNP  Fluticasone-Salmeterol (ADVAIR) 250-50 MCG/DOSE AEPB Inhale 1 puff  into the lungs 2 (two) times daily. 03/17/20  Yes [provider]  furosemide (LASIX) 20 MG tablet Take 1 tablet (20 mg total) by mouth daily. 10/13/19  Yes Lurene Shadow, MD  ipratropium-albuterol (DUONEB) 0.5-2.5 (3) MG/3ML SOLN Take 3 mLs by nebulization every 4 (four) hours as needed. 12/01/17  Yes Auburn Bilberry, MD  metFORMIN (GLUCOPHAGE) 1000 MG tablet Take 1,000 mg by mouth 2 (two) times daily.    Yes [provider]  nitroGLYCERIN (NITROSTAT) 0.4 MG SL tablet Place 0.4 mg under the tongue every 5 (five) minutes as needed for chest pain.   Yes [provider]  sacubitril-valsartan (ENTRESTO) 97-103 MG Take 1 tablet by mouth 2 (two) times daily. 04/14/20  Yes Delma Freeze, FNP     Review of Systems  Constitutional: Positive for fatigue (at times). Negative for appetite change.  HENT: Negative for congestion, rhinorrhea and sore throat.   Eyes: Negative.   Respiratory: Positive for cough (dry ) and shortness of breath (with moderate exertion). Negative for chest tightness and wheezing.   Cardiovascular: Negative for chest pain, palpitations and leg swelling.  Gastrointestinal: Negative for abdominal distention and abdominal pain.  Endocrine: Negative.   Genitourinary: Negative.   Musculoskeletal: Negative for arthralgias, back pain and neck pain.  Skin: Negative.   Allergic/Immunologic: Negative.   Neurological: Negative for dizziness and light-headedness.  Hematological: Negative for adenopathy. Does not bruise/bleed easily.  Psychiatric/Behavioral: Negative for dysphoric mood and sleep disturbance (sleeping on 2 pillows). The patient is not nervous/anxious.    Vitals:   10/22/20 0930  BP: 136/62  Pulse: 82  Resp: 20  SpO2: 100%  Weight: 211 lb 6 oz (95.9 kg)  Height: 5\' 7"  (1.702 m)   Wt Readings from Last 3 Encounters:  10/22/20 211 lb 6 oz (95.9 kg)  06/02/20 210 lb (95.3 kg)  04/14/20 208 lb (94.3 kg)   Lab Results  Component Value Date    CREATININE 1.33 (H) 04/12/2020   CREATININE 1.16 04/10/2020   CREATININE 1.11 10/13/2019    Physical Exam Vitals and nursing note reviewed.  Constitutional:      Appearance: He is well-developed.  HENT:     Head: Normocephalic and atraumatic.  Neck:     Vascular: No JVD.  Cardiovascular:     Rate and Rhythm: Normal rate and regular rhythm.  Pulmonary:     Effort: Pulmonary effort is normal.     Breath sounds: No wheezing or rales.  Abdominal:     General: There is no distension.     Palpations: Abdomen is soft.     Tenderness: There is no abdominal tenderness.  Musculoskeletal:        General: No tenderness.     Cervical back: Normal range of motion and neck supple.  Skin:    General: Skin is warm and dry.  Neurological:  Mental Status: He is alert and oriented to person, place, and time.  Psychiatric:        Behavior: Behavior normal.        Thought Content: Thought content normal.     Assessment & Plan:  1: Chronic heart failure with reduced ejection fraction- - NYHA class II - euvolemic today - weighing himself daily; reminded to call for an overnight weight gain of >2 pounds or a weekly weight gain of >5 pounds - weight up 3 pounds since he was last here 6 months ago - not adding salt and has been reading food labels. Reviewed the importance of keeping sodium intake to 2000mg  daily.  - ate at [facility] earlier today; was aware that he was getting more sodium than usual - add farxiga 10mg  daily; 30 day voucher given to patient - check BMP next visit - with the start of farxiga, advised patient to use his furosemide PRN for above weight gain or swelling - saw cardiology ([facility]-Ellis) 07/15/20 - had ICD implantation 01/02/20; denies any firing of ICD - BNP 10/10/19 was 582.0 - reports receiving all 3 COVID vaccines - reports receiving his flu vaccine for this season  2: HTN- - BP looks good today - BMP from 04/12/20 reviewed and shows sodium 135,  potassium 4.5, creatinine 1.33 and GFR >60 - saw PCP [facility]) "not long ago"  3: DM- - says that glucose at home is running low 100's- 150's - A1c on 04/11/20 was 6.0%  4:Tobacco use-  - smoking 1/2 ppd of cigarettes - also has a 24 ounce beer "every now and then" - complete cessation discussed for 3 minutes with the patient   Patient did not bring his medications nor a list. Each medication was verbally reviewed with the patient and he was encouraged to bring the bottles to every visit to confirm accuracy of list.  Return in 3 weeks or sooner for any questions/problems before then.

## 2020-10-22 ENCOUNTER — Other Ambulatory Visit: Payer: Self-pay

## 2020-10-22 ENCOUNTER — Encounter: Payer: Self-pay | Admitting: Family

## 2020-10-22 ENCOUNTER — Ambulatory Visit: Payer: Medicare Other | Attending: Family | Admitting: Family

## 2020-10-22 VITALS — BP 136/62 | HR 82 | Resp 20 | Ht 67.0 in | Wt 211.4 lb

## 2020-10-22 DIAGNOSIS — Z7982 Long term (current) use of aspirin: Secondary | ICD-10-CM | POA: Insufficient documentation

## 2020-10-22 DIAGNOSIS — F17210 Nicotine dependence, cigarettes, uncomplicated: Secondary | ICD-10-CM | POA: Insufficient documentation

## 2020-10-22 DIAGNOSIS — J449 Chronic obstructive pulmonary disease, unspecified: Secondary | ICD-10-CM | POA: Insufficient documentation

## 2020-10-22 DIAGNOSIS — Z7951 Long term (current) use of inhaled steroids: Secondary | ICD-10-CM | POA: Diagnosis not present

## 2020-10-22 DIAGNOSIS — I509 Heart failure, unspecified: Secondary | ICD-10-CM | POA: Diagnosis present

## 2020-10-22 DIAGNOSIS — E1122 Type 2 diabetes mellitus with diabetic chronic kidney disease: Secondary | ICD-10-CM | POA: Diagnosis not present

## 2020-10-22 DIAGNOSIS — I5022 Chronic systolic (congestive) heart failure: Secondary | ICD-10-CM

## 2020-10-22 DIAGNOSIS — I130 Hypertensive heart and chronic kidney disease with heart failure and stage 1 through stage 4 chronic kidney disease, or unspecified chronic kidney disease: Secondary | ICD-10-CM | POA: Insufficient documentation

## 2020-10-22 DIAGNOSIS — I2510 Atherosclerotic heart disease of native coronary artery without angina pectoris: Secondary | ICD-10-CM | POA: Diagnosis not present

## 2020-10-22 DIAGNOSIS — Z7984 Long term (current) use of oral hypoglycemic drugs: Secondary | ICD-10-CM | POA: Diagnosis not present

## 2020-10-22 DIAGNOSIS — E119 Type 2 diabetes mellitus without complications: Secondary | ICD-10-CM

## 2020-10-22 DIAGNOSIS — I10 Essential (primary) hypertension: Secondary | ICD-10-CM

## 2020-10-22 DIAGNOSIS — N183 Chronic kidney disease, stage 3 unspecified: Secondary | ICD-10-CM | POA: Insufficient documentation

## 2020-10-22 DIAGNOSIS — I5042 Chronic combined systolic (congestive) and diastolic (congestive) heart failure: Secondary | ICD-10-CM | POA: Diagnosis not present

## 2020-10-22 DIAGNOSIS — Z720 Tobacco use: Secondary | ICD-10-CM

## 2020-10-22 MED ORDER — DAPAGLIFLOZIN PROPANEDIOL 10 MG PO TABS: 10.0000 mg | ORAL_TABLET | Freq: Every day | ORAL | 5 refills | Status: DC

## 2020-10-22 NOTE — Patient Instructions (Addendum)
Continue weighing daily and call for an overnight weight gain of > 2 pounds or a weekly weight gain of >5 pounds.   Begin Jorge Cruz as 1 tablet once a day. When you start this medicine, you will stop taking your furosemide (lasix) every day and take it if you need it for above weight gain or any swelling.

## 2020-10-29 ENCOUNTER — Emergency Department: Payer: Medicare Other

## 2020-10-29 ENCOUNTER — Other Ambulatory Visit: Payer: Self-pay

## 2020-10-29 ENCOUNTER — Emergency Department
Admission: EM | Admit: 2020-10-29 | Discharge: 2020-10-29 | Disposition: A | Discharge status: Home / Self Care | Payer: Medicare Other | Attending: Emergency Medicine | Admitting: Emergency Medicine

## 2020-10-29 ENCOUNTER — Encounter: Payer: Self-pay | Admitting: Emergency Medicine

## 2020-10-29 DIAGNOSIS — I2510 Atherosclerotic heart disease of native coronary artery without angina pectoris: Secondary | ICD-10-CM | POA: Diagnosis not present

## 2020-10-29 DIAGNOSIS — J441 Chronic obstructive pulmonary disease with (acute) exacerbation: Secondary | ICD-10-CM | POA: Diagnosis not present

## 2020-10-29 DIAGNOSIS — I5042 Chronic combined systolic (congestive) and diastolic (congestive) heart failure: Secondary | ICD-10-CM | POA: Diagnosis not present

## 2020-10-29 DIAGNOSIS — N183 Chronic kidney disease, stage 3 unspecified: Secondary | ICD-10-CM | POA: Diagnosis not present

## 2020-10-29 DIAGNOSIS — I130 Hypertensive heart and chronic kidney disease with heart failure and stage 1 through stage 4 chronic kidney disease, or unspecified chronic kidney disease: Secondary | ICD-10-CM | POA: Insufficient documentation

## 2020-10-29 DIAGNOSIS — Z7951 Long term (current) use of inhaled steroids: Secondary | ICD-10-CM | POA: Diagnosis not present

## 2020-10-29 DIAGNOSIS — F17210 Nicotine dependence, cigarettes, uncomplicated: Secondary | ICD-10-CM | POA: Diagnosis not present

## 2020-10-29 DIAGNOSIS — R635 Abnormal weight gain: Secondary | ICD-10-CM | POA: Diagnosis present

## 2020-10-29 DIAGNOSIS — Z7982 Long term (current) use of aspirin: Secondary | ICD-10-CM | POA: Insufficient documentation

## 2020-10-29 DIAGNOSIS — E119 Type 2 diabetes mellitus without complications: Secondary | ICD-10-CM | POA: Insufficient documentation

## 2020-10-29 DIAGNOSIS — I509 Heart failure, unspecified: Secondary | ICD-10-CM

## 2020-10-29 DIAGNOSIS — Z7984 Long term (current) use of oral hypoglycemic drugs: Secondary | ICD-10-CM | POA: Diagnosis not present

## 2020-10-29 LAB — CBC
HCT: 43 % (ref 39.0–52.0)
Hemoglobin: 13.8 g/dL (ref 13.0–17.0)
MCH: 30.6 pg (ref 26.0–34.0)
MCHC: 32.1 g/dL (ref 30.0–36.0)
MCV: 95.3 fL (ref 80.0–100.0)
Platelets: 208 10*3/uL (ref 150–400)
RBC: 4.51 MIL/uL (ref 4.22–5.81)
RDW: 15.3 % (ref 11.5–15.5)
WBC: 4.8 10*3/uL (ref 4.0–10.5)
nRBC: 0 % (ref 0.0–0.2)

## 2020-10-29 LAB — TROPONIN I (HIGH SENSITIVITY)
Troponin I (High Sensitivity): 39 ng/L — ABNORMAL HIGH (ref <18)
Troponin I (High Sensitivity): 44 ng/L — ABNORMAL HIGH (ref <18)

## 2020-10-29 LAB — COMPREHENSIVE METABOLIC PANEL
ALT: 13 U/L (ref 0–44)
AST: 24 U/L (ref 15–41)
Albumin: 4 g/dL (ref 3.5–5.0)
Alkaline Phosphatase: 72 U/L (ref 38–126)
Anion gap: 7 (ref 5–15)
BUN: 15 mg/dL (ref 8–23)
CO2: 24 mmol/L (ref 22–32)
Calcium: 9 mg/dL (ref 8.9–10.3)
Chloride: 110 mmol/L (ref 98–111)
Creatinine, Ser: 1.16 mg/dL (ref 0.61–1.24)
GFR, Estimated: >60 mL/min (ref >60)
Glucose, Bld: 137 mg/dL — ABNORMAL HIGH (ref 70–99)
Potassium: 4.7 mmol/L (ref 3.5–5.1)
Sodium: 141 mmol/L (ref 135–145)
Total Bilirubin: 1 mg/dL (ref 0.3–1.2)
Total Protein: 7.4 g/dL (ref 6.5–8.1)

## 2020-10-29 LAB — BRAIN NATRIURETIC PEPTIDE: B Natriuretic Peptide: 259.7 pg/mL — ABNORMAL HIGH (ref 0.0–100.0)

## 2020-10-29 NOTE — ED Provider Notes (Signed)
[facility] Emergency Department Provider Note  Time seen: 7:40 AM  I have reviewed the triage vital signs and the nursing notes.   HISTORY  Chief Complaint Shortness of Breath   HPI Jorge Cruz is a 70 y.o. male with a past medical history of CHF, CKD, COPD, diabetes, hypertension, presents to the emergency department for weight gain.  According to the patient he weighed himself several days ago no weight himself again this morning he had gained 4 pounds in 2 days.  States he took Lasix 2 days ago and again this morning.  He is prescribed 20 mg tablets but took 40 mg this morning.  Due to the weight gain he came to the emergency department for evaluation to make sure he was not in a heart failure exacerbation.  Patient denies any shortness of breath and less exerting himself.  Denies any orthopnea.  No leg swelling.  Denies any recent chest pain.   Past Medical History:  Diagnosis Date  . Chronic combined systolic (congestive) and diastolic (congestive) heart failure (HCC)    a. 03/2014 Echo: EF 45%, glob HK; b. 03/2016 Echo: EF 20-25%, diff HK, Gr1 DD, mild MR; b. 11/2017 Echo: EF 20%, diff HK, Gr2 DD, mildly to mod reduced RV fxn, PASP .  . CKD (chronic kidney disease), stage III (HCC)   . CKD (chronic kidney disease), stage III (HCC)   . COPD (chronic obstructive pulmonary disease) (HCC)   . Diabetes mellitus without complication (HCC)   . Hypertensive heart disease   . NICM (nonischemic cardiomyopathy) (HCC)    a. 03/2014 Echo: EF 45%, glob HK, mild conc LVH; b. 03/2014 MV: possible mild ischemia, superimposed on small inf infarct, EF 36%; c. 03/2016 MV: EF <30%, no ischemia; d. 03/2016 Echo: EF 20-25%, diff HK, Gr1 DD, mild MR; e. 05/2017 Cath: nonobs dzs, EF 20%; f. 11/2017 Echo: EF 20%, diff HK, Gr2 DD.  . Non-obstructive CAD (coronary artery disease)    a. 05/2017 Cath: LM nl, LAD 46m, LCX 27m, RCA 75m, EF 20%.  . Polysubstance abuse (HCC)   . Tobacco abuse      Patient Active Problem List   Diagnosis Date Noted  . Chest pain 04/11/2020  . CKD (chronic kidney disease), stage III (HCC)   . Right hip pain   . Acute on chronic respiratory failure (HCC) 10/10/2019  . Coronary artery disease involving native coronary artery of native heart without angina pectoris 05/17/2018  . Chronic combined systolic (congestive) and diastolic (congestive) heart failure (HCC) 06/09/2017  . Tobacco use 06/09/2017  . NSTEMI (non-ST elevated myocardial infarction) (HCC) 05/31/2017  . COPD exacerbation (HCC) 04/01/2016  . Essential hypertension 04/01/2016  . HLD (hyperlipidemia) 04/01/2016  . Atypical chest pain 03/31/2016  . Elevated troponin 03/31/2016  . Diabetes (HCC) 03/31/2016    Past Surgical History:  Procedure Laterality Date  . RIGHT/LEFT HEART CATH AND CORONARY ANGIOGRAPHY N/A 06/01/2017   Procedure: RIGHT/LEFT HEART CATH AND CORONARY ANGIOGRAPHY;  Surgeon: Iran Ouch, MD;  Location: [facility] INVASIVE CV LAB;  Service: Cardiovascular;  Laterality: N/A;    Prior to Admission medications   Medication Sig Start Date End Date Taking? Authorizing Provider  albuterol (PROVENTIL HFA;VENTOLIN HFA) 108 (90 Base) MCG/ACT inhaler Inhale 2 puffs into the lungs every 6 (six) hours as needed for wheezing or shortness of breath.    [provider]  aspirin EC 81 MG tablet Take 1 tablet (81 mg total) by mouth daily. 04/01/16   Allena Katz,  Sona, MD  atorvastatin (LIPITOR) 80 MG tablet Take 80 mg by mouth daily.    [provider]  carvedilol (COREG) 3.125 MG tablet Take 1 tablet (3.125 mg total) by mouth 2 (two) times daily with a meal. MUST MAKE APPT for further refills 01/08/20   Delma Freeze, FNP  dapagliflozin propanediol (FARXIGA) 10 MG TABS tablet Take 1 tablet (10 mg total) by mouth daily before breakfast. 10/22/20   Delma Freeze, FNP  Fluticasone-Salmeterol (ADVAIR) 250-50 MCG/DOSE AEPB Inhale 1 puff into the lungs 2 (two) times daily.  03/17/20   [provider]  furosemide (LASIX) 20 MG tablet Take 1 tablet (20 mg total) by mouth daily. Patient taking differently: No sig reported 10/13/19   Lurene Shadow, MD  ipratropium-albuterol (DUONEB) 0.5-2.5 (3) MG/3ML SOLN Take 3 mLs by nebulization every 4 (four) hours as needed. 12/01/17   Auburn Bilberry, MD  metFORMIN (GLUCOPHAGE) 1000 MG tablet Take 1,000 mg by mouth 2 (two) times daily.     [provider]  nitroGLYCERIN (NITROSTAT) 0.4 MG SL tablet Place 0.4 mg under the tongue every 5 (five) minutes as needed for chest pain.    [provider]  sacubitril-valsartan (ENTRESTO) 97-103 MG Take 1 tablet by mouth 2 (two) times daily. 04/14/20   Delma Freeze, FNP    No Known Allergies  Family History  Problem Relation Age of Onset  . Diabetes Mother   . Diabetes Father     Social History Social History   Tobacco Use  . Smoking status: Current Some Day Smoker    Packs/day: 0.90    Years: 52.00    Pack years: 46.80    Types: Cigarettes  . Smokeless tobacco: Never Used  Vaping Use  . Vaping Use: Never used  Substance Use Topics  . Alcohol use: No  . Drug use: No    Comment: 2 years ago    Review of Systems Constitutional: Negative for fever. Cardiovascular: Negative for chest pain. Respiratory: Mild shortness of breath with exertion.  None currently. Gastrointestinal: Negative for abdominal pain Musculoskeletal: Negative for leg swelling Neurological: Negative for headache All other ROS negative  ____________________________________________   PHYSICAL EXAM:  VITAL SIGNS: ED Triage Vitals  Enc Vitals Group     BP 10/29/20 0659 (!) 157/67     Pulse Rate 10/29/20 0659 69     Resp 10/29/20 0659 18     Temp 10/29/20 0659 98.3 F (36.8 C)     Temp Source 10/29/20 0659 Oral     SpO2 10/29/20 0659 100 %     Weight 10/29/20 0659 207 lb (93.9 kg)     Height 10/29/20 0659 5\' 7"  (1.702 m)     Head Circumference --      Peak Flow --       Pain Score 10/29/20 0656 0     Pain Loc --      Pain Edu? --      Excl. in GC? --    Constitutional: Alert and oriented. Well appearing and in no distress. Eyes: Normal exam ENT      Head: Normocephalic and atraumatic.      Mouth/Throat: Mucous membranes are moist. Cardiovascular: Normal rate, regular rhythm.  Respiratory: Normal respiratory effort without tachypnea nor retractions. Breath sounds are clear  Gastrointestinal: Soft and nontender. No distention.   Musculoskeletal: Nontender with normal range of motion in all extremities. No lower extremity tenderness or edema. Neurologic:  Normal speech and language. No gross focal  neurologic deficits Skin:  Skin is warm, dry and intact.  Psychiatric: Mood and affect are normal.   ____________________________________________    EKG  EKG viewed and interpreted by myself shows a normal sinus rhythm at 71 bpm with a narrow QRS, normal axis, normal intervals, no concerning ST changes.  ____________________________________________    RADIOLOGY  Chest x-ray shows pacemaker present.  No acute abnormality.  ____________________________________________   INITIAL IMPRESSION / ASSESSMENT AND PLAN / ED COURSE  Pertinent labs & imaging results that were available during my care of the patient were reviewed by me and considered in my medical decision making (see chart for details).   Patient presents emergency department for 4 pound weight gain over the past 2 days.  Overall the patient appears well, no distress.  Denies any shortness of breath currently but states some mild shortness of breath with exertion.  No orthopnea.  Patient has no lower extreme edema on exam, clear lung sounds and 100% room air saturation.  We will check labs including a troponin and BNP.  We will obtain a chest x-ray and continue to closely monitor.  EKG shows no concerning findings.  Lab work is overall reassuring.  Mild troponin elevation however largely  unchanged on repeat as well as largely unchanged from historical values.  Mild BNP elevation 259.  Patient has put out 800 cc of urine in the emergency department since arrival after taking 40 mg of Lasix this morning.  I discussed with the patient to use 20 mg of Lasix each morning for the next 5 days and to follow-up with his doctor.  Discussed return precautions for shortness of breath or continued weight gain.  Patient agreeable to plan of care.  Hardie Veltre was evaluated in Emergency Department on 10/29/2020 for the symptoms described in the history of present illness. He was evaluated in the context of the global COVID-19 pandemic, which necessitated consideration that the patient might be at risk for infection with the SARS-CoV-2 virus that causes COVID-19. Institutional protocols and algorithms that pertain to the evaluation of patients at risk for COVID-19 are in a state of rapid change based on information released by regulatory bodies including the CDC and federal and state organizations. These policies and algorithms were followed during the patient's care in the ED.  ____________________________________________   FINAL CLINICAL IMPRESSION(S) / ED DIAGNOSES  CHF   Minna Antis, MD 10/29/20 1010

## 2020-10-29 NOTE — ED Notes (Signed)
Pt presents to ED with c/o of "alittle bit more short of breath this morning". Pt states a 4 lb weight gain in 2 days and states a HX of CHF and states "I take some pill for it that they just changed" Pt states heart MD told him to take Lasix PRN and he took 2 this morning. Pt denies any increase swelling no pitting edema to BLE by this RN. Pt can speak in full sentences at this time with no distress, pt states he does smoke and has COPD. Pt states a recent dry cough but denies any +COVID contacts. Pt is A&Ox4. Pt denies chest pain. Pt is A&Ox4.

## 2020-10-29 NOTE — Discharge Instructions (Addendum)
Please take 20 mg of Lasix each morning for the next 5 days.  Please follow-up with your doctor for recheck/reevaluation.  Return to the emergency department for any chest pain or shortness of breath.

## 2020-10-29 NOTE — ED Triage Notes (Signed)
Patient ambulatory to triage with steady gait, without difficulty or distress noted; pt reports 4lb wt gain over last 2 days with hx CHF, taking 20mg  lasix only as needed; pt denies any c/o at present and denies any swelling; reports has had "a little" SHOB and occas nonprod cough

## 2020-11-02 ENCOUNTER — Telehealth: Payer: Self-pay | Admitting: Family

## 2020-11-02 NOTE — Telephone Encounter (Signed)
Spoke with patient to follow up since his recent ED visit. Patient is doing well since being home. He has no symptoms at this time, checking weight daily, and following a low sodium diet. He is taking his medications as prescribed and no issues with them. He confirmed his follow up appointment with Inetta Fermo for 3/23 at 930am.   Joice Lofts, NT

## 2020-11-11 ENCOUNTER — Ambulatory Visit: Payer: Medicare Other | Attending: Family | Admitting: Family

## 2020-11-11 ENCOUNTER — Encounter: Payer: Self-pay | Admitting: Family

## 2020-11-11 ENCOUNTER — Other Ambulatory Visit: Payer: Self-pay

## 2020-11-11 VITALS — BP 146/63 | HR 72 | Resp 20 | Ht 67.0 in | Wt 205.0 lb

## 2020-11-11 DIAGNOSIS — N183 Chronic kidney disease, stage 3 unspecified: Secondary | ICD-10-CM | POA: Diagnosis not present

## 2020-11-11 DIAGNOSIS — F17210 Nicotine dependence, cigarettes, uncomplicated: Secondary | ICD-10-CM | POA: Diagnosis not present

## 2020-11-11 DIAGNOSIS — E1122 Type 2 diabetes mellitus with diabetic chronic kidney disease: Secondary | ICD-10-CM | POA: Insufficient documentation

## 2020-11-11 DIAGNOSIS — Z7951 Long term (current) use of inhaled steroids: Secondary | ICD-10-CM | POA: Insufficient documentation

## 2020-11-11 DIAGNOSIS — Z720 Tobacco use: Secondary | ICD-10-CM

## 2020-11-11 DIAGNOSIS — I2510 Atherosclerotic heart disease of native coronary artery without angina pectoris: Secondary | ICD-10-CM | POA: Diagnosis not present

## 2020-11-11 DIAGNOSIS — Z7984 Long term (current) use of oral hypoglycemic drugs: Secondary | ICD-10-CM | POA: Insufficient documentation

## 2020-11-11 DIAGNOSIS — Z7982 Long term (current) use of aspirin: Secondary | ICD-10-CM | POA: Insufficient documentation

## 2020-11-11 DIAGNOSIS — J449 Chronic obstructive pulmonary disease, unspecified: Secondary | ICD-10-CM | POA: Insufficient documentation

## 2020-11-11 DIAGNOSIS — Z833 Family history of diabetes mellitus: Secondary | ICD-10-CM | POA: Diagnosis not present

## 2020-11-11 DIAGNOSIS — E119 Type 2 diabetes mellitus without complications: Secondary | ICD-10-CM

## 2020-11-11 DIAGNOSIS — I130 Hypertensive heart and chronic kidney disease with heart failure and stage 1 through stage 4 chronic kidney disease, or unspecified chronic kidney disease: Secondary | ICD-10-CM | POA: Diagnosis not present

## 2020-11-11 DIAGNOSIS — I10 Essential (primary) hypertension: Secondary | ICD-10-CM

## 2020-11-11 DIAGNOSIS — I5022 Chronic systolic (congestive) heart failure: Secondary | ICD-10-CM

## 2020-11-11 NOTE — Patient Instructions (Signed)
Continue weighing daily and call for an overnight weight gain of > 2 pounds or a weekly weight gain of >5 pounds. 

## 2020-11-11 NOTE — Progress Notes (Signed)
Patient ID: Fulton Merry, male    DOB: 1951/03/10, 70 y.o.   MRN: [id number]  HPI  Mr Novelo is a 70 y/o male with a history of DM, COPD, HTN, current tobacco use and chronic heart failure.  Echo report from 07/06/20 reviewed and showed an EF of 25%. Echo report from 10/11/19 reviewed and showed an EF of 30-35% along with trivial MR. Echo report from 11/25/17 reviewed and showed an EF of 20-25% along with a mildly elevated PA pressure of 35 mm Hg. Echo report from 04/01/16 was reviewed and shows an EF of 20-25% along with mild MR.   Cardiac catheterization was done 06/01/17 which showed an EF of 20% along with mild nonobstructive CAD. RHC also done which showed normal filling pressures, normal pulmonary pressure and moderately reduced cardiac output. Cardiac output was 3.82 with a cardiac index of 1.83.  Was in the ED 10/29/20 due to 4 pound weight gain and shortness of breath. Treated and released. Admitted 07/05/20 due to respiratory failure along with acute on chronic heart failure and suspected stroke. Diuretic adjusted. Discharged after 5 days.   He presents today for a follow-up visit with a chief complaint of minimal shortness of breath upon moderate exertion. He describes this as having been present for several years with varying levels of severity. He has associated fatigue, dry cough and occasional light-headedness along with this. He denies any difficulty sleeping, abdominal distention, palpitations, pedal edema, chest pain, wheezing or weight gain.   Has been taking his furosemide PRN but did take a couple doses when he had a 4 pound weight gain prior to presenting to the ED. Discussed that he could call our office and we could bring him in or order outpatient IV lasix if needed as a way to try and help keep him out of the ED.   Past Medical History:  Diagnosis Date  . Chronic combined systolic (congestive) and diastolic (congestive) heart failure (HCC)    a. 03/2014 Echo: EF 45%, glob HK;  b. 03/2016 Echo: EF 20-25%, diff HK, Gr1 DD, mild MR; b. 11/2017 Echo: EF 20%, diff HK, Gr2 DD, mildly to mod reduced RV fxn, PASP .  . CKD (chronic kidney disease), stage III (HCC)   . CKD (chronic kidney disease), stage III (HCC)   . COPD (chronic obstructive pulmonary disease) (HCC)   . Diabetes mellitus without complication (HCC)   . Hypertensive heart disease   . NICM (nonischemic cardiomyopathy) (HCC)    a. 03/2014 Echo: EF 45%, glob HK, mild conc LVH; b. 03/2014 MV: possible mild ischemia, superimposed on small inf infarct, EF 36%; c. 03/2016 MV: EF <30%, no ischemia; d. 03/2016 Echo: EF 20-25%, diff HK, Gr1 DD, mild MR; e. 05/2017 Cath: nonobs dzs, EF 20%; f. 11/2017 Echo: EF 20%, diff HK, Gr2 DD.  . Non-obstructive CAD (coronary artery disease)    a. 05/2017 Cath: LM nl, LAD 72m, LCX 23m, RCA 44m, EF 20%.  . Polysubstance abuse (HCC)   . Tobacco abuse    Past Surgical History:  Procedure Laterality Date  . RIGHT/LEFT HEART CATH AND CORONARY ANGIOGRAPHY N/A 06/01/2017   Procedure: RIGHT/LEFT HEART CATH AND CORONARY ANGIOGRAPHY;  Surgeon: Iran Ouch, MD;  Location: [facility] INVASIVE CV LAB;  Service: Cardiovascular;  Laterality: N/A;   Family History  Problem Relation Age of Onset  . Diabetes Mother   . Diabetes Father    Social History   Tobacco Use  . Smoking status: Current Some Day Smoker  Packs/day: 0.90    Years: 52.00    Pack years: 46.80    Types: Cigarettes  . Smokeless tobacco: Never Used  Substance Use Topics  . Alcohol use: No   No Known Allergies  Prior to Admission medications   Medication Sig Start Date End Date Taking? Authorizing Provider  albuterol (PROVENTIL HFA;VENTOLIN HFA) 108 (90 Base) MCG/ACT inhaler Inhale 2 puffs into the lungs every 6 (six) hours as needed for wheezing or shortness of breath.   Yes [provider]  aspirin EC 81 MG tablet Take 1 tablet (81 mg total) by mouth daily. 04/01/16  Yes Enedina Finner, MD  atorvastatin  (LIPITOR) 80 MG tablet Take 80 mg by mouth daily.   Yes [provider]  carvedilol (COREG) 3.125 MG tablet Take 1 tablet (3.125 mg total) by mouth 2 (two) times daily with a meal. MUST MAKE APPT for further refills 01/08/20  Yes Clarisa Kindred A, FNP  dapagliflozin propanediol (FARXIGA) 10 MG TABS tablet Take 1 tablet (10 mg total) by mouth daily before breakfast. 10/22/20  Yes Tosca Pletz A, FNP  Fluticasone-Salmeterol (ADVAIR) 250-50 MCG/DOSE AEPB Inhale 1 puff into the lungs 2 (two) times daily. 03/17/20  Yes [provider]  furosemide (LASIX) 20 MG tablet Take 1 tablet (20 mg total) by mouth daily. Patient taking differently: Take 20 mg by mouth as needed. 10/13/19  Yes Lurene Shadow, MD  ipratropium-albuterol (DUONEB) 0.5-2.5 (3) MG/3ML SOLN Take 3 mLs by nebulization every 4 (four) hours as needed. 12/01/17  Yes Auburn Bilberry, MD  metFORMIN (GLUCOPHAGE) 1000 MG tablet Take 1,000 mg by mouth 2 (two) times daily.    Yes [provider]  nitroGLYCERIN (NITROSTAT) 0.4 MG SL tablet Place 0.4 mg under the tongue every 5 (five) minutes as needed for chest pain.   Yes [provider]  sacubitril-valsartan (ENTRESTO) 97-103 MG Take 1 tablet by mouth 2 (two) times daily. 04/14/20  Yes Yomira Flitton, Inetta Fermo A, FNP  spironolactone (ALDACTONE) 25 MG tablet Take 25 mg by mouth daily.   Yes [provider]    Review of Systems  Constitutional: Positive for fatigue (at times). Negative for appetite change.  HENT: Negative for congestion, rhinorrhea and sore throat.   Eyes: Negative.   Respiratory: Positive for cough (dry ) and shortness of breath (with moderate exertion). Negative for chest tightness and wheezing.   Cardiovascular: Negative for chest pain, palpitations and leg swelling.  Gastrointestinal: Negative for abdominal distention and abdominal pain.  Endocrine: Negative.   Genitourinary: Negative.   Musculoskeletal: Negative for arthralgias, back pain and neck  pain.  Skin: Negative.   Allergic/Immunologic: Negative.   Neurological: Positive for light-headedness. Negative for dizziness.  Hematological: Negative for adenopathy. Does not bruise/bleed easily.  Psychiatric/Behavioral: Negative for dysphoric mood and sleep disturbance (sleeping on 2 pillows). The patient is not nervous/anxious.    Vitals:   11/11/20 0922  BP: (!) 146/63  Pulse: 72  Resp: 20  SpO2: 100%  Weight: 205 lb (93 kg)  Height: 5\' 7"  (1.702 m)   Wt Readings from Last 3 Encounters:  11/11/20 205 lb (93 kg)  10/29/20 207 lb (93.9 kg)  10/22/20 211 lb 6 oz (95.9 kg)   Lab Results  Component Value Date   CREATININE 1.16 10/29/2020   CREATININE 1.33 (H) 04/12/2020   CREATININE 1.16 04/10/2020    Physical Exam Vitals and nursing note reviewed. Exam conducted with a chaperone present (wife).  Constitutional:      Appearance: He is well-developed.  HENT:     Head: Normocephalic and atraumatic.  Neck:     Vascular: No JVD.  Cardiovascular:     Rate and Rhythm: Normal rate and regular rhythm.  Pulmonary:     Effort: Pulmonary effort is normal.     Breath sounds: No wheezing or rales.  Abdominal:     General: There is no distension.     Palpations: Abdomen is soft.     Tenderness: There is no abdominal tenderness.  Musculoskeletal:        General: No tenderness.     Cervical back: Normal range of motion and neck supple.  Skin:    General: Skin is warm and dry.  Neurological:     Mental Status: He is alert and oriented to person, place, and time.  Psychiatric:        Behavior: Behavior normal.        Thought Content: Thought content normal.     Assessment & Plan:  1: Chronic heart failure with reduced ejection fraction- - NYHA class II - euvolemic today - weighing himself daily; reminded to call for an overnight weight gain of >2 pounds or a weekly weight gain of >5 pounds - weight down 6 pounds since he was last here 3 weeks ago - not adding salt and  has been reading food labels. Reviewed the importance of keeping sodium intake to 2000mg  daily.  - farxiga 10mg  daily added at last visit; he also had jardiance bottle with him and said he was taking both; big X put on the jardiance bottle and explained that he doesn't take both of them - saw cardiology (Harshaw-Ellis) 07/15/20 - on GDMT of carvedilol, farxiga, entresto and spironolactone - consider titrating up carvedilol and/ or adding bidil - had ICD implantation 01/02/20; denies any firing of ICD - BNP 10/29/20 was 259.7 - reports receiving all 3 COVID vaccines - reports receiving his flu vaccine for this season  2: HTN- - BP mildly elevated tdoay - BMP from 10/29/20 reviewed and shows sodium 141, potassium 4.7, creatinine 1.16 and GFR >60 - sees PCP [facility])   3: DM- - says that glucose at home was 97 this morning - A1c on 04/11/20 was 6.0%  4:Tobacco use-  - smoking 1/2 ppd of cigarettes - has ~ 4 ounces of crown royal at bedtime but not always on a nightly basis - complete cessation discussed for 3 minutes with the patient   Medication bottles reviewed.   Return in 2 months or sooner for any questions/problems before then.

## 2021-01-09 NOTE — Progress Notes (Deleted)
Patient ID: Jorge Cruz, male    DOB: 1951/01/22, 70 y.o.   MRN: [id number]  HPI  Mr Longest is a 70 y/o male with a history of DM, COPD, HTN, current tobacco use and chronic heart failure.  Echo report from 07/06/20 reviewed and showed an EF of 25%. Echo report from 10/11/19 reviewed and showed an EF of 30-35% along with trivial MR. Echo report from 11/25/17 reviewed and showed an EF of 20-25% along with a mildly elevated PA pressure of 35 mm Hg. Echo report from 04/01/16 was reviewed and shows an EF of 20-25% along with mild MR.   Cardiac catheterization was done 06/01/17 which showed an EF of 20% along with mild nonobstructive CAD. RHC also done which showed normal filling pressures, normal pulmonary pressure and moderately reduced cardiac output. Cardiac output was 3.82 with a cardiac index of 1.83.  Was in the ED 10/29/20 due to 4 pound weight gain and shortness of breath. Treated and released.   He presents today for a follow-up visit with a chief complaint of   Past Medical History:  Diagnosis Date  . Chronic combined systolic (congestive) and diastolic (congestive) heart failure (HCC)    a. 03/2014 Echo: EF 45%, glob HK; b. 03/2016 Echo: EF 20-25%, diff HK, Gr1 DD, mild MR; b. 11/2017 Echo: EF 20%, diff HK, Gr2 DD, mildly to mod reduced RV fxn, PASP .  . CKD (chronic kidney disease), stage III (HCC)   . CKD (chronic kidney disease), stage III (HCC)   . COPD (chronic obstructive pulmonary disease) (HCC)   . Diabetes mellitus without complication (HCC)   . Hypertensive heart disease   . NICM (nonischemic cardiomyopathy) (HCC)    a. 03/2014 Echo: EF 45%, glob HK, mild conc LVH; b. 03/2014 MV: possible mild ischemia, superimposed on small inf infarct, EF 36%; c. 03/2016 MV: EF <30%, no ischemia; d. 03/2016 Echo: EF 20-25%, diff HK, Gr1 DD, mild MR; e. 05/2017 Cath: nonobs dzs, EF 20%; f. 11/2017 Echo: EF 20%, diff HK, Gr2 DD.  . Non-obstructive CAD (coronary artery disease)    a. 05/2017 Cath:  LM nl, LAD 27m, LCX 41m, RCA 72m, EF 20%.  . Polysubstance abuse (HCC)   . Tobacco abuse    Past Surgical History:  Procedure Laterality Date  . RIGHT/LEFT HEART CATH AND CORONARY ANGIOGRAPHY N/A 06/01/2017   Procedure: RIGHT/LEFT HEART CATH AND CORONARY ANGIOGRAPHY;  Surgeon: Iran Ouch, MD;  Location: [facility] INVASIVE CV LAB;  Service: Cardiovascular;  Laterality: N/A;   Family History  Problem Relation Age of Onset  . Diabetes Mother   . Diabetes Father    Social History   Tobacco Use  . Smoking status: Current Some Day Smoker    Packs/day: 0.90    Years: 52.00    Pack years: 46.80    Types: Cigarettes  . Smokeless tobacco: Never Used  Substance Use Topics  . Alcohol use: No   No Known Allergies    Review of Systems  Constitutional: Positive for fatigue (at times). Negative for appetite change.  HENT: Negative for congestion, rhinorrhea and sore throat.   Eyes: Negative.   Respiratory: Positive for cough (dry ) and shortness of breath (with moderate exertion). Negative for chest tightness and wheezing.   Cardiovascular: Negative for chest pain, palpitations and leg swelling.  Gastrointestinal: Negative for abdominal distention and abdominal pain.  Endocrine: Negative.   Genitourinary: Negative.   Musculoskeletal: Negative for arthralgias, back pain and neck pain.  Skin: Negative.  Allergic/Immunologic: Negative.   Neurological: Positive for light-headedness. Negative for dizziness.  Hematological: Negative for adenopathy. Does not bruise/bleed easily.  Psychiatric/Behavioral: Negative for dysphoric mood and sleep disturbance (sleeping on 2 pillows). The patient is not nervous/anxious.      Physical Exam Vitals and nursing note reviewed. Exam conducted with a chaperone present (wife).  Constitutional:      Appearance: He is well-developed.  HENT:     Head: Normocephalic and atraumatic.  Neck:     Vascular: No JVD.  Cardiovascular:     Rate and Rhythm:  Normal rate and regular rhythm.  Pulmonary:     Effort: Pulmonary effort is normal.     Breath sounds: No wheezing or rales.  Abdominal:     General: There is no distension.     Palpations: Abdomen is soft.     Tenderness: There is no abdominal tenderness.  Musculoskeletal:        General: No tenderness.     Cervical back: Normal range of motion and neck supple.  Skin:    General: Skin is warm and dry.  Neurological:     Mental Status: He is alert and oriented to person, place, and time.  Psychiatric:        Behavior: Behavior normal.        Thought Content: Thought content normal.     Assessment & Plan:  1: Chronic heart failure with reduced ejection fraction- - NYHA class II - euvolemic today - weighing himself daily; reminded to call for an overnight weight gain of >2 pounds or a weekly weight gain of >5 pounds - weight 205 pounds since he was last here 2 months ago - not adding salt and has been reading food labels. Reviewed the importance of keeping sodium intake to 2000mg  daily.  - saw cardiology (Harshaw-Ellis) 07/15/20 - on GDMT of carvedilol, farxiga, entresto and spironolactone - consider titrating up carvedilol and/ or adding bidil - had ICD implantation 01/02/20; denies any firing of ICD - BNP 10/29/20 was 259.7  2: HTN- - BP  - BMP from 10/29/20 reviewed and shows sodium 141, potassium 4.7, creatinine 1.16 and GFR >60 - sees PCP [facility])   3: DM- - says that glucose at home was  - A1c on 04/11/20 was 6.0%  4:Tobacco use-  - smoking 1/2 ppd of cigarettes - has ~ 4 ounces of crown royal at bedtime but not always on a nightly basis - complete cessation discussed for 3 minutes with the patient   Medication bottles reviewed.

## 2021-01-11 ENCOUNTER — Ambulatory Visit: Payer: Medicare Other | Admitting: Family

## 2021-01-11 ENCOUNTER — Telehealth: Payer: Self-pay | Admitting: Family

## 2021-01-11 NOTE — Telephone Encounter (Signed)
Patient did not show for his [facility] Failure [facility] appointment on 01/11/21. Will attempt to reschedule.  

## 2021-02-19 ENCOUNTER — Telehealth: Payer: Self-pay | Admitting: Internal Medicine

## 2021-02-19 ENCOUNTER — Ambulatory Visit: Payer: Self-pay | Admitting: General Surgery

## 2021-02-19 NOTE — Telephone Encounter (Signed)
Primary Cardiologist:Christopher End, MD  Chart reviewed as part of pre-operative protocol coverage. Because of Jorge Cruz's past medical history and time since last visit, he/she will require a follow-up visit in order to better assess preoperative cardiovascular risk.  Pre-op covering staff: - Please schedule appointment and call patient to inform them. - Please contact requesting surgeon's office via preferred method (i.e, phone, fax) to inform them of need for appointment prior to surgery.  If applicable, this message will also be routed to pharmacy pool and/or primary cardiologist for input on holding anticoagulant/antiplatelet agent as requested below so that this information is available at time of patient's appointment.   Ronney Asters, NP  02/19/2021, 12:37 PM

## 2021-02-19 NOTE — Telephone Encounter (Signed)
Patient with diagnosis of afib on Eliquis for anticoagulation.    Procedure: Inguinal hernia repair  Date of procedure: TBD  CHA2DS2-VASc Score = 5  This indicates a 7.2% annual risk of stroke. The patient's score is based upon: CHF History: Yes HTN History: Yes Diabetes History: Yes Stroke History: No Vascular Disease History: Yes Age Score: 1 Gender Score: 0    CrCl 66 ml/min Platelet count 207  Per office protocol, patient can hold Eliquis for 2 days prior to procedure.

## 2021-02-19 NOTE — Telephone Encounter (Signed)
   [facility] Pre-operative Risk Assessment    Patient Name: Henery Betzold  DOB: 05/03/1951  MRN: [id number]   HEARTCARE STAFF: - Please ensure there is not already an duplicate clearance open for this procedure. - Under Visit Info/Reason for Call, type in Other and utilize the format Clearance MM/DD/YY or Clearance TBD. Do not use dashes or single digits. - If request is for dental extraction, please clarify the # of teeth to be extracted. - If the patient is currently at the dentist's office, call Pre-Op APP to address. If the patient is not currently in the dentist office, please route to the Pre-Op pool  Request for surgical clearance:  What type of surgery is being performed? Inguinal hernia repair   When is this surgery scheduled? TBD   What type of clearance is required (medical clearance vs. Pharmacy clearance to hold med vs. Both)? both  Are there any medications that need to be held prior to surgery and how long? Hold Eliquis x 2 days   Practice name and name of physician performing surgery? [facility] - Dr Peyton Najjar   What is the office phone number? [phone]   7.   What is the office fax number? [phone]  8.   Anesthesia type (None, local, MAC, general) ? Not listed    Ace Gins 02/19/2021, 11:25 AM  _________________________________________________________________   (provider comments below)

## 2021-02-19 NOTE — Telephone Encounter (Signed)
Left message for patient to contact office for follow up appointment.

## 2021-02-23 NOTE — Telephone Encounter (Signed)
Left message for the pt to call back [phone] [facility] office and schedule an appt for pre op clearance with Dr. Okey Dupre or APP.

## 2021-02-23 NOTE — Progress Notes (Signed)
Follow-up Outpatient Visit Date: 02/24/2021  Primary Care Provider: [facility] [address] [place] [address]  Chief Complaint: Preop evaluation  HPI:  Mr. Mathews is a 70 y.o. male with history of chronic HFrEF, nonobstructive coronary artery disease, hypertension, diabetes mellitus, chronic kidney disease, COPD, and polysubstance abuse, who presents for preoperative cardiovascular risk assessment in anticipation of inguinal hernia repair.  I last saw him in 04/2018, after which time he transitioned his cardiac care to [facility] (though he has continued to be seen by Clarisa Kindred, NP, in the [facility] HF clinic).  He subsequently transitioned his care to [facility] after being hospitalized there last fall due to HFrEF and acute hypercapnic respiratory failure requiring mechanical ventilation.  Today, Mr. Labelle reports that he has been feeling well other than intermittent pain and bulging in the right groin, most pronounced when he wears a belt or lies on his stomach.  From a heart standpoint, he has been doing well without shortness of breath, orthopnea, palpitations, lightheadedness, or edema.  He notes a few episodes of tightness in the chest that he describes like a "fist bawling up."  This happens every few weeks, usually at rest.  He remains active, working as a Chiropractor, and denies exertional chest pain.  He continues to smoke 1/2 pack/day.  He reports being compliant with his medications.  It appears that he was recently started on apixaban by [facility] after remote device interrogation showed a 24-hour episode of atrial fibrillation.  Mr. Willert was asymptomatic.  --------------------------------------------------------------------------------------------------  Past Medical History:  Diagnosis Date   Chronic combined systolic (congestive) and diastolic (congestive) heart failure (HCC)    a. 03/2014 Echo: EF 45%, glob HK; b. 03/2016 Echo: EF 20-25%, diff HK, Gr1 DD, mild MR; b.  11/2017 Echo: EF 20%, diff HK, Gr2 DD, mildly to mod reduced RV fxn, PASP .   CKD (chronic kidney disease), stage III (HCC)    CKD (chronic kidney disease), stage III (HCC)    COPD (chronic obstructive pulmonary disease) (HCC)    Diabetes mellitus without complication (HCC)    Hypertensive heart disease    NICM (nonischemic cardiomyopathy) (HCC)    a. 03/2014 Echo: EF 45%, glob HK, mild conc LVH; b. 03/2014 MV: possible mild ischemia, superimposed on small inf infarct, EF 36%; c. 03/2016 MV: EF <30%, no ischemia; d. 03/2016 Echo: EF 20-25%, diff HK, Gr1 DD, mild MR; e. 05/2017 Cath: nonobs dzs, EF 20%; f. 11/2017 Echo: EF 20%, diff HK, Gr2 DD.   Non-obstructive CAD (coronary artery disease)    a. 05/2017 Cath: LM nl, LAD 2m, LCX 24m, RCA 24m, EF 20%.   Polysubstance abuse (HCC)    Tobacco abuse    Past Surgical History:  Procedure Laterality Date   RIGHT/LEFT HEART CATH AND CORONARY ANGIOGRAPHY N/A 06/01/2017   Procedure: RIGHT/LEFT HEART CATH AND CORONARY ANGIOGRAPHY;  Surgeon: Iran Ouch, MD;  Location: [facility] INVASIVE CV LAB;  Service: Cardiovascular;  Laterality: N/A;    Current Meds  Medication Sig   albuterol (PROVENTIL HFA;VENTOLIN HFA) 108 (90 Base) MCG/ACT inhaler Inhale 2 puffs into the lungs every 6 (six) hours as needed for wheezing or shortness of breath.   allopurinol (ZYLOPRIM) 100 MG tablet Take 1 tablet by mouth daily.   apixaban (ELIQUIS) 5 MG TABS tablet Take 5 mg by mouth 2 (two) times daily.   atorvastatin (LIPITOR) 80 MG tablet Take 80 mg by mouth daily.   carvedilol (COREG) 3.125 MG tablet Take 1 tablet (  3.125 mg total) by mouth 2 (two) times daily with a meal. MUST MAKE APPT for further refills   dapagliflozin propanediol (FARXIGA) 10 MG TABS tablet Take 1 tablet (10 mg total) by mouth daily before breakfast.   Fluticasone-Salmeterol (ADVAIR) 250-50 MCG/DOSE AEPB Inhale 1 puff into the lungs 2 (two) times daily.   furosemide (LASIX) 20 MG tablet Take 1 tablet  (20 mg total) by mouth daily.   ipratropium-albuterol (DUONEB) 0.5-2.5 (3) MG/3ML SOLN Take 3 mLs by nebulization every 4 (four) hours as needed.   magnesium oxide (MAG-OX) 400 MG tablet Take 1 tablet by mouth in the morning and at bedtime.   metFORMIN (GLUCOPHAGE) 1000 MG tablet Take 1,000 mg by mouth 2 (two) times daily.    nitroGLYCERIN (NITROSTAT) 0.4 MG SL tablet Place 0.4 mg under the tongue every 5 (five) minutes as needed for chest pain.   sacubitril-valsartan (ENTRESTO) 97-103 MG Take 1 tablet by mouth 2 (two) times daily.   spironolactone (ALDACTONE) 25 MG tablet Take 25 mg by mouth daily.    Allergies: Patient has no known allergies.  Social History   Tobacco Use   Smoking status: Some Days    Packs/day: 0.50    Years: 52.00    Pack years: 26.00    Types: Cigarettes   Smokeless tobacco: Never  Vaping Use   Vaping Use: Never used  Substance Use Topics   Alcohol use: No   Drug use: No    Comment: 2 years ago    Family History  Problem Relation Age of Onset   Diabetes Mother    Diabetes Father     Review of Systems: A 12-system review of systems was performed and was negative except as noted in the HPI.  --------------------------------------------------------------------------------------------------  Physical Exam: BP 110/70 (BP Location: Left Arm, Patient Position: Sitting, Cuff Size: Large)   Pulse 71   Ht 5' 6.5" (1.689 m)   Wt 212 lb (96.2 kg)   SpO2 95%   BMI 33.71 kg/m   General:  NAD. Neck: No JVD or HJR. Lungs: Clear to auscultation bilaterally without wheezes or crackles. Heart: Regular rate and rhythm without murmurs, rubs, or gallops. Abdomen: Soft, nontender, nondistended. Extremities: No lower extremity edema.  EKG: Normal sinus rhythm with nonspecific T wave changes, more pronounced than prior tracing on 10/29/2020.  Otherwise, no significant interval change.  Lab Results  Component Value Date   WBC 4.8 10/29/2020   HGB 13.8  10/29/2020   HCT 43.0 10/29/2020   MCV 95.3 10/29/2020   PLT 208 10/29/2020    Lab Results  Component Value Date   NA 141 10/29/2020   K 4.7 10/29/2020   CL 110 10/29/2020   CO2 24 10/29/2020   BUN 15 10/29/2020   CREATININE 1.16 10/29/2020   GLUCOSE 137 (H) 10/29/2020   ALT 13 10/29/2020    Lab Results  Component Value Date   CHOL 151 04/01/2016   HDL 46 04/01/2016   LDLCALC 99 04/01/2016   TRIG 363 (H) 11/27/2017   CHOLHDL 3.3 04/01/2016    --------------------------------------------------------------------------------------------------  ASSESSMENT AND PLAN: Chronic HFrEF due to nonischemic cardiomyopathy: Mr. Jaroszewski appears well compensated with NYHA class I symptoms.  Most recent echo at [facility] in 06/2020 showed continued severe reduction in LVEF.  In he is on reasonable goal-directed medical therapy consisting of carvedilol, Entresto, Farxiga, and spironolactone.  We will defer medication changes today, though further escalation of carvedilol should be considered at future visits.  Nonobstructive coronary artery disease: Mild  CAD noted by catheterization in 2018.  Chest pain remains atypical and stable from prior visit several years ago.  Continue medical therapy including high intensity statin therapy to prevent progression of disease.  No plans for repeat ischemia evaluation at this time.  Paroxysmal atrial fibrillation: Incidentally noted on device interrogation last month through [facility].  EKG today shows sinus rhythm.  Given a CHA2DS2-VASc score of at least 5 (hypertension, diabetes, age greater than 9, heart failure, and vascular disease), we will plan to continue apixaban 5 mg twice daily.  Preoperative cardiovascular risk assessment: Mr. Koval appears well compensated and is able to complete greater than 4 METS of activity in spite of his severely reduced LVEF.  He is at increase risk for perioperative cardiovascular complications, though additional cardiac testing  or intervention at this time will not further mitigate his perioperative risk.  I think it is reasonable for him to proceed with elective hernia repair.  Apixaban should be held 2 days prior to the surgery and restarted when it is felt safe to do so by his surgeon.  Follow-up: Return to clinic in 3 months.  Yvonne Kendall, MD 02/24/2021 9:01 AM

## 2021-02-23 NOTE — Telephone Encounter (Signed)
Scheduled 7-6 at [address]

## 2021-02-23 NOTE — Telephone Encounter (Signed)
Pt has been scheduled to see Dr. Okey Dupre 7/6 for pre op clearance. Will forward notes to MD for appt. Will send FYI to surgeon's office pt has appt 7/6.

## 2021-02-24 ENCOUNTER — Encounter: Payer: Self-pay | Admitting: Internal Medicine

## 2021-02-24 ENCOUNTER — Other Ambulatory Visit: Payer: Self-pay

## 2021-02-24 ENCOUNTER — Ambulatory Visit (INDEPENDENT_AMBULATORY_CARE_PROVIDER_SITE_OTHER): Payer: 59 | Admitting: Internal Medicine

## 2021-02-24 VITALS — BP 110/70 | HR 71 | Ht 66.5 in | Wt 212.0 lb

## 2021-02-24 DIAGNOSIS — I480 Paroxysmal atrial fibrillation: Secondary | ICD-10-CM

## 2021-02-24 DIAGNOSIS — I428 Other cardiomyopathies: Secondary | ICD-10-CM | POA: Diagnosis not present

## 2021-02-24 DIAGNOSIS — I5022 Chronic systolic (congestive) heart failure: Secondary | ICD-10-CM

## 2021-02-24 DIAGNOSIS — I2510 Atherosclerotic heart disease of native coronary artery without angina pectoris: Secondary | ICD-10-CM

## 2021-02-24 DIAGNOSIS — Z01810 Encounter for preprocedural cardiovascular examination: Secondary | ICD-10-CM | POA: Diagnosis not present

## 2021-02-24 NOTE — Telephone Encounter (Signed)
OV faxed to surgeon via Epic fax function

## 2021-02-24 NOTE — Patient Instructions (Signed)
Medication Instructions:  Your physician recommends that you continue on your current medications as directed. Please refer to the Current Medication list given to you today.  *If you need a refill on your cardiac medications before your next appointment, please call your pharmacy*   Lab Work: None ordered If you have labs (blood work) drawn today and your tests are completely normal, you will receive your results only by: . MyChart Message (if you have MyChart) OR . A paper copy in the mail If you have any lab test that is abnormal or we need to change your treatment, we will call you to review the results.   Testing/Procedures: None ordered   Follow-Up: At [facility], you and your health needs are our priority.  As part of our continuing mission to provide you with exceptional heart care, we have created designated Provider Care Teams.  These Care Teams include your primary Cardiologist (physician) and Advanced Practice Providers (APPs -  Physician Assistants and Nurse Practitioners) who all work together to provide you with the care you need, when you need it.  We recommend signing up for the patient portal called "MyChart".  Sign up information is provided on this After Visit Summary.  MyChart is used to connect with patients for Virtual Visits (Telemedicine).  Patients are able to view lab/test results, encounter notes, upcoming appointments, etc.  Non-urgent messages can be sent to your provider as well.   To learn more about what you can do with MyChart, go to [email or web address].    Your next appointment:   3 month(s)  The format for your next appointment:   In Person  Provider:   You may see Christopher End, MD or one of the following Advanced Practice Providers on your designated Care Team:    Christopher Berge, NP  Ryan Dunn, PA-C  Jacquelyn Visser, PA-C  Cadence Furth, PA-C  Caitlin Walker, NP    Other Instructions N/A  

## 2021-03-01 ENCOUNTER — Ambulatory Visit: Payer: Self-pay | Admitting: General Surgery

## 2021-03-01 ENCOUNTER — Other Ambulatory Visit
Admission: RE | Admit: 2021-03-01 | Discharge: 2021-03-01 | Disposition: A | Discharge status: Home / Self Care | Payer: 59 | Source: Ambulatory Visit | Attending: General Surgery | Admitting: General Surgery

## 2021-03-01 ENCOUNTER — Other Ambulatory Visit: Payer: Self-pay

## 2021-03-01 NOTE — Patient Instructions (Addendum)
Your procedure is scheduled on:Wednesday March 03, 2021. Report to Day Surgery inside [facility] 2nd floor stop by admissions desk first before getting on elevator) To find out your arrival time please call [phone] between 1PM - 3PM on Tuesday March 02, 2021.  Remember: Instructions that are not followed completely may result in serious medical risk,  up to and including death, or upon the discretion of your surgeon and anesthesiologist your  surgery may need to be rescheduled.     _X__ 1. Do not eat food or drink fluids after midnight the night before your procedure.                 No chewing gum or hard candies.                   __X__2.  On the morning of surgery brush your teeth with toothpaste and water, you                may rinse your mouth with mouthwash if you wish.  Do not swallow any toothpaste of mouthwash.     _X__ 3.  No Alcohol for 24 hours before or after surgery.   _X__ 4.  Do Not Smoke or use e-cigarettes For 24 Hours Prior to Your Surgery.                 Do not use any chewable tobacco products for at least 6 hours prior to                 Surgery.  _X__  5.  Do not use any recreational drugs (marijuana, cocaine, heroin, ecstasy, MDMA or other)                For at least one week prior to your surgery.  Combination of these drugs with anesthesia                May have life threatening results.  __X__ 6.  Notify your doctor if there is any change in your medical condition      (cold, fever, infections).     Do not wear jewelry, make-up, hairpins, clips or nail polish. Do not wear lotions, powders, or perfumes. You may wear deodorant. Do not shave 48 hours prior to surgery. Men may shave face and neck. Do not bring valuables to the hospital.    [facility] is not responsible for any belongings or valuables.  Contacts, dentures or bridgework may not be worn into surgery. Leave your suitcase in the car. After surgery it may be  brought to your room. For patients admitted to the hospital, discharge time is determined by your treatment team.   Patients discharged the day of surgery will not be allowed to drive home.   Make arrangements for someone to be with you for the first 24 hours of your Same Day Discharge.   __X__ Take these medicines the morning of surgery with A SIP OF WATER:    1. carvedilol (COREG) 3.125 MG   2. allopurinol (ZYLOPRIM) 100 MG   3. atorvastatin (LIPITOR) 80 MG  4.  5.  6.  ____ Fleet Enema (as directed)   __X__ Use Antibacterial Soap as directed  ____ Use Benzoyl Peroxide Gel as instructed  __X__ Use inhalers on the day of surgery  albuterol (PROVENTIL HFA;VENTOLIN HFA) 108 (90 Base) MCG/ACT inhaler  Fluticasone-Salmeterol (ADVAIR) 250-50 MCG/DOSE AEPB  __X__ Stop metFORMIN (GLUCOPHAGE) 1000 MG and dapagliflozin propanediol (FARXIGA) 10  MG now 2 days prior to surgery    ____ Take 1/2 of usual insulin dose the night before surgery. No insulin the morning          of surgery.   __X__ Stop apixaban (ELIQUIS) 5 MG now as instructed by your doctor.   __X__ One Week prior to surgery- Stop Anti-inflammatories such as Ibuprofen, Aleve, Advil, Motrin, meloxicam (MOBIC), diclofenac, etodolac, ketorolac, Toradol, Daypro, piroxicam, Goody's or BC powders. OK TO USE TYLENOL IF NEEDED   __X__ Stop supplements until after surgery.    ____ Bring C-Pap to the hospital.    If you have any questions regarding your pre-procedure instructions,  Please call Pre-admit Testing at [phone]

## 2021-03-03 ENCOUNTER — Encounter: Payer: Self-pay | Admitting: General Surgery

## 2021-03-03 ENCOUNTER — Other Ambulatory Visit: Payer: Self-pay

## 2021-03-03 ENCOUNTER — Ambulatory Visit: Payer: 59 | Admitting: Certified Registered Nurse Anesthetist

## 2021-03-03 ENCOUNTER — Ambulatory Visit
Admission: RE | Admit: 2021-03-03 | Discharge: 2021-03-03 | Disposition: A | Discharge status: Home / Self Care | Payer: 59 | Attending: General Surgery | Admitting: General Surgery

## 2021-03-03 ENCOUNTER — Encounter
Admission: RE | Disposition: A | Discharge status: Home / Self Care | Payer: Self-pay | Source: Home / Self Care | Attending: General Surgery

## 2021-03-03 DIAGNOSIS — K4090 Unilateral inguinal hernia, without obstruction or gangrene, not specified as recurrent: Secondary | ICD-10-CM | POA: Diagnosis present

## 2021-03-03 DIAGNOSIS — F17210 Nicotine dependence, cigarettes, uncomplicated: Secondary | ICD-10-CM | POA: Insufficient documentation

## 2021-03-03 DIAGNOSIS — Z794 Long term (current) use of insulin: Secondary | ICD-10-CM | POA: Diagnosis not present

## 2021-03-03 DIAGNOSIS — Z7901 Long term (current) use of anticoagulants: Secondary | ICD-10-CM | POA: Insufficient documentation

## 2021-03-03 DIAGNOSIS — Z7982 Long term (current) use of aspirin: Secondary | ICD-10-CM | POA: Diagnosis not present

## 2021-03-03 DIAGNOSIS — Z79899 Other long term (current) drug therapy: Secondary | ICD-10-CM | POA: Insufficient documentation

## 2021-03-03 HISTORY — PX: XI ROBOTIC ASSISTED INGUINAL HERNIA REPAIR WITH MESH: SHX6706

## 2021-03-03 LAB — GLUCOSE, CAPILLARY
Glucose-Capillary: 98 mg/dL (ref 70–99)
Glucose-Capillary: 129 mg/dL — ABNORMAL HIGH (ref 70–99)

## 2021-03-03 SURGERY — REPAIR, HERNIA, INGUINAL, ROBOT-ASSISTED, LAPAROSCOPIC, USING MESH
Anesthesia: General | Site: Inguinal | Laterality: Right

## 2021-03-03 MED ORDER — ONDANSETRON HCL 4 MG/2ML IJ SOLN: 4.0000 mg | Freq: Once | INTRAMUSCULAR | Status: DC | PRN

## 2021-03-03 MED ORDER — ACETAMINOPHEN 10 MG/ML IV SOLN
INTRAVENOUS | Status: AC
  Filled 2021-03-03: qty 100

## 2021-03-03 MED ORDER — OXYCODONE HCL 5 MG/5ML PO SOLN: 5.0000 mg | Freq: Once | ORAL | Status: AC | PRN

## 2021-03-03 MED ORDER — FENTANYL CITRATE (PF) 100 MCG/2ML IJ SOLN
INTRAMUSCULAR | Status: AC
  Filled 2021-03-03: qty 2

## 2021-03-03 MED ORDER — FENTANYL CITRATE (PF) 100 MCG/2ML IJ SOLN
INTRAMUSCULAR | Status: AC
  Administered 2021-03-03: 50 ug via INTRAVENOUS
  Filled 2021-03-03: qty 2

## 2021-03-03 MED ORDER — CEFAZOLIN SODIUM-DEXTROSE 2-4 GM/100ML-% IV SOLN
INTRAVENOUS | Status: AC
  Filled 2021-03-03: qty 100

## 2021-03-03 MED ORDER — GLYCOPYRROLATE 0.2 MG/ML IJ SOLN
INTRAMUSCULAR | Status: DC | PRN
  Administered 2021-03-03: .2 mg via INTRAVENOUS

## 2021-03-03 MED ORDER — ACETAMINOPHEN 10 MG/ML IV SOLN
INTRAVENOUS | Status: DC | PRN
  Administered 2021-03-03: 1000 mg via INTRAVENOUS

## 2021-03-03 MED ORDER — ESMOLOL HCL 100 MG/10ML IV SOLN
INTRAVENOUS | Status: AC
  Filled 2021-03-03: qty 10

## 2021-03-03 MED ORDER — BUPIVACAINE-EPINEPHRINE (PF) 0.25% -1:200000 IJ SOLN
INTRAMUSCULAR | Status: AC
  Filled 2021-03-03: qty 30

## 2021-03-03 MED ORDER — OXYCODONE HCL 5 MG PO TABS: 5.0000 mg | ORAL_TABLET | Freq: Once | ORAL | Status: AC | PRN

## 2021-03-03 MED ORDER — LIDOCAINE HCL (CARDIAC) PF 100 MG/5ML IV SOSY
PREFILLED_SYRINGE | INTRAVENOUS | Status: DC | PRN
  Administered 2021-03-03: 80 mg via INTRAVENOUS

## 2021-03-03 MED ORDER — PHENYLEPHRINE HCL (PRESSORS) 10 MG/ML IV SOLN
INTRAVENOUS | Status: AC
  Filled 2021-03-03: qty 1

## 2021-03-03 MED ORDER — OXYCODONE HCL 5 MG PO TABS
ORAL_TABLET | ORAL | Status: AC
  Administered 2021-03-03: 5 mg via ORAL
  Filled 2021-03-03: qty 1

## 2021-03-03 MED ORDER — ROCURONIUM BROMIDE 100 MG/10ML IV SOLN
INTRAVENOUS | Status: DC | PRN
  Administered 2021-03-03: 60 mg via INTRAVENOUS

## 2021-03-03 MED ORDER — HYDROCODONE-ACETAMINOPHEN 5-325 MG PO TABS: 1.0000 | ORAL_TABLET | ORAL | 0 refills | Status: AC | PRN

## 2021-03-03 MED ORDER — NOREPINEPHRINE 4 MG/250ML-% IV SOLN
INTRAVENOUS | Status: AC
  Filled 2021-03-03: qty 250

## 2021-03-03 MED ORDER — PHENYLEPHRINE HCL (PRESSORS) 10 MG/ML IV SOLN
INTRAVENOUS | Status: DC | PRN
  Administered 2021-03-03: 100 ug via INTRAVENOUS
  Administered 2021-03-03 (×2): 200 ug via INTRAVENOUS

## 2021-03-03 MED ORDER — LIDOCAINE HCL (PF) 2 % IJ SOLN
INTRAMUSCULAR | Status: AC
  Filled 2021-03-03: qty 10

## 2021-03-03 MED ORDER — MIDAZOLAM HCL 2 MG/2ML IJ SOLN
INTRAMUSCULAR | Status: DC | PRN
  Administered 2021-03-03 (×2): 1 mg via INTRAVENOUS

## 2021-03-03 MED ORDER — ESMOLOL HCL 100 MG/10ML IV SOLN
INTRAVENOUS | Status: DC | PRN
  Administered 2021-03-03: 10 mg via INTRAVENOUS

## 2021-03-03 MED ORDER — ORAL CARE MOUTH RINSE: 15.0000 mL | Freq: Once | OROMUCOSAL | Status: AC

## 2021-03-03 MED ORDER — SUGAMMADEX SODIUM 200 MG/2ML IV SOLN
INTRAVENOUS | Status: DC | PRN
  Administered 2021-03-03: 200 mg via INTRAVENOUS

## 2021-03-03 MED ORDER — ONDANSETRON HCL 4 MG/2ML IJ SOLN
INTRAMUSCULAR | Status: DC | PRN
  Administered 2021-03-03: 4 mg via INTRAVENOUS

## 2021-03-03 MED ORDER — FENTANYL CITRATE (PF) 100 MCG/2ML IJ SOLN
INTRAMUSCULAR | Status: DC | PRN
  Administered 2021-03-03: 50 ug via INTRAVENOUS
  Administered 2021-03-03: 25 ug via INTRAVENOUS
  Administered 2021-03-03: 50 ug via INTRAVENOUS

## 2021-03-03 MED ORDER — CHLORHEXIDINE GLUCONATE 0.12 % MT SOLN
OROMUCOSAL | Status: AC
  Administered 2021-03-03: 15 mL via OROMUCOSAL
  Filled 2021-03-03: qty 15

## 2021-03-03 MED ORDER — PROPOFOL 10 MG/ML IV BOLUS
INTRAVENOUS | Status: DC | PRN
  Administered 2021-03-03: 100 mg via INTRAVENOUS

## 2021-03-03 MED ORDER — FENTANYL CITRATE (PF) 100 MCG/2ML IJ SOLN
25.0000 ug | INTRAMUSCULAR | Status: DC | PRN
  Administered 2021-03-03: 50 ug via INTRAVENOUS

## 2021-03-03 MED ORDER — ACETAMINOPHEN 10 MG/ML IV SOLN: 1000.0000 mg | Freq: Once | INTRAVENOUS | Status: DC | PRN

## 2021-03-03 MED ORDER — FAMOTIDINE 20 MG PO TABS
ORAL_TABLET | ORAL | Status: AC
  Administered 2021-03-03: 20 mg
  Filled 2021-03-03: qty 1

## 2021-03-03 MED ORDER — CEFAZOLIN SODIUM-DEXTROSE 2-4 GM/100ML-% IV SOLN
2.0000 g | INTRAVENOUS | Status: AC
  Administered 2021-03-03: 2 g via INTRAVENOUS

## 2021-03-03 MED ORDER — PHENYLEPHRINE HCL-NACL 10-0.9 MG/250ML-% IV SOLN
INTRAVENOUS | Status: DC | PRN
  Administered 2021-03-03: 75 ug/min via INTRAVENOUS

## 2021-03-03 MED ORDER — BUPIVACAINE-EPINEPHRINE 0.25% -1:200000 IJ SOLN
INTRAMUSCULAR | Status: DC | PRN
  Administered 2021-03-03: 30 mL

## 2021-03-03 MED ORDER — SODIUM CHLORIDE 0.9 % IV SOLN: INTRAVENOUS | Status: DC

## 2021-03-03 MED ORDER — MIDAZOLAM HCL 2 MG/2ML IJ SOLN
INTRAMUSCULAR | Status: AC
  Filled 2021-03-03: qty 2

## 2021-03-03 MED ORDER — CHLORHEXIDINE GLUCONATE 0.12 % MT SOLN: 15.0000 mL | Freq: Once | OROMUCOSAL | Status: AC

## 2021-03-03 SURGICAL SUPPLY — 53 items
ADH SKN CLS APL DERMABOND .7 (GAUZE/BANDAGES/DRESSINGS) ×1
APL PRP STRL LF DISP 70% ISPRP (MISCELLANEOUS) ×1
BAG INFUSER PRESSURE 100CC (MISCELLANEOUS) IMPLANT
BLADE SURG SZ11 CARB STEEL (BLADE) ×2 IMPLANT
CANISTER SUCT 1200ML W/VALVE (MISCELLANEOUS) ×1 IMPLANT
CHLORAPREP W/TINT 26 (MISCELLANEOUS) ×2 IMPLANT
COVER TIP SHEARS 8 DVNC (MISCELLANEOUS) ×1 IMPLANT
COVER TIP SHEARS 8MM DA VINCI (MISCELLANEOUS) ×1
COVER WAND RF STERILE (DRAPES) ×2 IMPLANT
DEFOGGER SCOPE WARMER CLEARIFY (MISCELLANEOUS) ×2 IMPLANT
DERMABOND ADVANCED (GAUZE/BANDAGES/DRESSINGS) ×1
DERMABOND ADVANCED .7 DNX12 (GAUZE/BANDAGES/DRESSINGS) ×1 IMPLANT
DRAPE ARM DVNC X/XI (DISPOSABLE) ×3 IMPLANT
DRAPE COLUMN DVNC XI (DISPOSABLE) ×1 IMPLANT
DRAPE DA VINCI XI ARM (DISPOSABLE) ×3
DRAPE DA VINCI XI COLUMN (DISPOSABLE) ×1
ELECT REM PT RETURN 9FT ADLT (ELECTROSURGICAL) ×2
ELECTRODE REM PT RTRN 9FT ADLT (ELECTROSURGICAL) ×1 IMPLANT
GAUZE 4X4 16PLY ~~LOC~~+RFID DBL (SPONGE) ×2 IMPLANT
GLOVE SURG ENC MOIS LTX SZ6.5 (GLOVE) ×6 IMPLANT
GLOVE SURG UNDER POLY LF SZ6.5 (GLOVE) ×6 IMPLANT
GOWN STRL REUS W/ TWL LRG LVL3 (GOWN DISPOSABLE) ×3 IMPLANT
GOWN STRL REUS W/TWL LRG LVL3 (GOWN DISPOSABLE) ×6
IRRIGATOR SUCT 8 DISP DVNC XI (IRRIGATION / IRRIGATOR) IMPLANT
IRRIGATOR SUCTION 8MM XI DISP (IRRIGATION / IRRIGATOR)
IV CATH ANGIO 12GX3 LT BLUE (NEEDLE) IMPLANT
IV NS 1000ML (IV SOLUTION)
IV NS 1000ML BAXH (IV SOLUTION) IMPLANT
KIT PINK PAD W/HEAD ARE REST (MISCELLANEOUS) ×2
KIT PINK PAD W/HEAD ARM REST (MISCELLANEOUS) ×1 IMPLANT
LABEL OR SOLS (LABEL) IMPLANT
MANIFOLD NEPTUNE II (INSTRUMENTS) ×2 IMPLANT
MESH 3DMAX 4X6 RT LRG (Mesh General) ×1 IMPLANT
MESH 3DMAX MID 4X6 RT LRG (Mesh General) IMPLANT
NDL INSUFFLATION 14GA 120MM (NEEDLE) ×1 IMPLANT
NEEDLE HYPO 22GX1.5 SAFETY (NEEDLE) ×2 IMPLANT
NEEDLE INSUFFLATION 14GA 120MM (NEEDLE) ×2 IMPLANT
OBTURATOR OPTICAL STANDARD 8MM (TROCAR) ×1
OBTURATOR OPTICAL STND 8 DVNC (TROCAR) ×1
OBTURATOR OPTICALSTD 8 DVNC (TROCAR) ×1 IMPLANT
PACK LAP CHOLECYSTECTOMY (MISCELLANEOUS) ×2 IMPLANT
SEAL CANN UNIV 5-8 DVNC XI (MISCELLANEOUS) ×3 IMPLANT
SEAL XI 5MM-8MM UNIVERSAL (MISCELLANEOUS) ×3
SET TUBE SMOKE EVAC HIGH FLOW (TUBING) ×2 IMPLANT
SOLUTION ELECTROLUBE (MISCELLANEOUS) ×2 IMPLANT
SUT MNCRL 4-0 (SUTURE) ×2
SUT MNCRL 4-0 27XMFL (SUTURE) ×1
SUT VIC AB 2-0 SH 27 (SUTURE) ×2
SUT VIC AB 2-0 SH 27XBRD (SUTURE) ×1 IMPLANT
SUT VLOC 90 S/L VL9 GS22 (SUTURE) ×2 IMPLANT
SUTURE MNCRL 4-0 27XMF (SUTURE) ×1 IMPLANT
TAPE TRANSPORE STRL 2 31045 (GAUZE/BANDAGES/DRESSINGS) IMPLANT
TRAY FOLEY MTR SLVR 16FR STAT (SET/KITS/TRAYS/PACK) ×1 IMPLANT

## 2021-03-03 NOTE — Anesthesia Postprocedure Evaluation (Signed)
Anesthesia Post Note  Patient: Jorge Cruz  Procedure(s) Performed: XI ROBOTIC ASSISTED vs Open INGUINAL HERNIA REPAIR WITH MESH (Right: Breast)  Patient location during evaluation: PACU Anesthesia Type: General Level of consciousness: awake and alert Pain management: pain level controlled Vital Signs Assessment: post-procedure vital signs reviewed and stable Respiratory status: spontaneous breathing, nonlabored ventilation, respiratory function stable and patient connected to nasal cannula oxygen Cardiovascular status: blood pressure returned to baseline and stable Postop Assessment: no apparent nausea or vomiting Anesthetic complications: no   No notable events documented.   Last Vitals:  Vitals:   03/03/21 1520 03/03/21 1610  BP: (!) 150/81 129/65  Pulse: 85 69  Resp: 16 16  Temp: 36.4 C (!) 36.4 C  SpO2: 100% 99%    Last Pain:  Vitals:   03/03/21 1610  TempSrc:   PainSc: 5                  Corinda Gubler

## 2021-03-03 NOTE — Progress Notes (Signed)
Per Dr. Hazle Quant patient ok to be discharged home without needing to void at time of discharge. Patient educated to go to ED if unable to void in 8 hours. Patient and family verbalized understanding.

## 2021-03-03 NOTE — Anesthesia Preprocedure Evaluation (Addendum)
Anesthesia Evaluation  Patient identified by MRN, date of birth, ID band Patient awake    Reviewed: Allergy & Precautions, NPO status , Patient's Chart, lab work & pertinent test results  History of Anesthesia Complications Negative for: history of anesthetic complications  Airway Mallampati: II  TM Distance: >3 FB Neck ROM: Full    Dental  (+) Edentulous Lower, Edentulous Upper   Pulmonary neg sleep apnea, COPD, Current SmokerPatient did not abstain from smoking.,    Pulmonary exam normal breath sounds clear to auscultation       Cardiovascular Exercise Tolerance: Good METShypertension, + CAD, + Past MI and +CHF  (-) dysrhythmias + Cardiac Defibrillator  Rhythm:Regular Rate:Normal - Systolic murmurs Severe HFrEF. Seen his cardiologist last week who deemed him as optimized as he can be for an elective procedure.  Last fall, patient intubated for CHF exacerbation and respiratory failure. Echo at that time: SEVERE LV DYSFUNCTION (EF 25%)  MILD RV SYSTOLIC DYSFUNCTION  VALVULAR REGURGITATION: TRIVIAL MR, TRIVIAL PR, TRIVIAL TR  NO VALVULAR STENOSIS  RV POORLY VISUALIZED BUT FUNCTION NOW APPEARS MILDLY REDUCED     Neuro/Psych negative neurological ROS  negative psych ROS   GI/Hepatic neg GERD  ,(+)     (-) substance abuse  ,   Endo/Other  diabetes  Renal/GU CRFRenal disease     Musculoskeletal   Abdominal   Peds  Hematology   Anesthesia Other Findings Past Medical History: No date: Chronic combined systolic (congestive) and diastolic  (congestive) heart failure (HCC)     Comment:  a. 03/2014 Echo: EF 45%, glob HK; b. 03/2016 Echo: EF               20-25%, diff HK, Gr1 DD, mild MR; b. 11/2017 Echo: EF 20%,              diff HK, Gr2 DD, mildly to mod reduced RV fxn, PASP               . No date: CKD (chronic kidney disease), stage III (HCC) No date: CKD (chronic kidney disease), stage III (HCC) No  date: COPD (chronic obstructive pulmonary disease) (HCC) No date: Diabetes mellitus without complication (HCC) No date: Hypertensive heart disease No date: NICM (nonischemic cardiomyopathy) (HCC)     Comment:  a. 03/2014 Echo: EF 45%, glob HK, mild conc LVH; b.               03/2014 MV: possible mild ischemia, superimposed on small               inf infarct, EF 36%; c. 03/2016 MV: EF <30%, no ischemia;               d. 03/2016 Echo: EF 20-25%, diff HK, Gr1 DD, mild MR; e.               05/2017 Cath: nonobs dzs, EF 20%; f. 11/2017 Echo: EF 20%,              diff HK, Gr2 DD. No date: Non-obstructive CAD (coronary artery disease)     Comment:  a. 05/2017 Cath: LM nl, LAD 31m, LCX 61m, RCA 80m, EF               20%. No date: Polysubstance abuse (HCC) No date: Tobacco abuse  Reproductive/Obstetrics                            Anesthesia Physical Anesthesia Plan  ASA: 3  Anesthesia Plan: General   Post-op Pain Management:    Induction: Intravenous  PONV Risk Score and Plan: 2 and Ondansetron, Dexamethasone, Midazolam and Treatment may vary due to age or medical condition  Airway Management Planned: Oral ETT  Additional Equipment: Arterial line  Intra-op Plan:   Post-operative Plan: Extubation in OR  Informed Consent: I have reviewed the patients History and Physical, chart, labs and discussed the procedure including the risks, benefits and alternatives for the proposed anesthesia with the patient or authorized representative who has indicated his/her understanding and acceptance.     Dental advisory given  Plan Discussed with: CRNA and Surgeon  Anesthesia Plan Comments: (Discussed risks of anesthesia with patient, including PONV, sore throat, lip/dental damage. Rare risks discussed as well, such as cardiorespiratory and neurological sequelae. Patient counseled on being higher risk for anesthesia due to comorbidities: HFrEF. Patient was told about increased  risk of cardiac and respiratory events, including death. Patient understands. )       Anesthesia Quick Evaluation

## 2021-03-03 NOTE — H&P (Signed)
PATIENT PROFILE: Cristino Degroff is a 70 y.o. male who presents to the Clinic for consultation at the request of Dr. Nedra Hai for evaluation of right inguinal hernia.  PCP: Roe Rutherford, MD  HISTORY OF PRESENT ILLNESS: Mr. [place] reports having right inguinal hernia. He endorses having inguinal hernia for many months. He reported that he has been recently getting more aggravated. Pain localized to the right groin. Pain does not radiate to other part of body. Aggravating factor is certain abdominal wall movements. There has been no alleviating factors. Patient denies history of abdominal distention nausea vomiting.  Note patient has very complex cardiac history. Today patient is stable. No chest pain. Patient seems to be stable and doing well from his cardiac standpoint. No chest pain. No shortness of breath. Last evaluation by cardiology in March 2022 he was doing well without any deterioration of his cardiac condition.  PROBLEM LIST: Problem List Date Reviewed: 07/15/2020  Noted  Abnormal thyroid blood test 07/15/2020  HFrEF (heart failure with reduced ejection fraction) (CMS-HCC) (Chronic) 09/09/2018  Acute renal failure superimposed on stage 3 chronic kidney disease (CMS-HCC) 09/09/2018  Coronary artery disease involving native coronary artery of native heart without angina pectoris (Chronic) 05/17/2018  Nuclear sclerotic cataract of both eyes 11/01/2017  COPD (chronic obstructive pulmonary disease) (CMS-HCC) (Chronic) 06/05/2017  NSTEMI (non-ST elevated myocardial infarction) (CMS-HCC) 05/15/2017  Hyperlipidemia, unspecified (Chronic) 04/01/2016  Essential hypertension (Chronic) 04/01/2016  Diabetes mellitus, type 2 (CMS-HCC) (Chronic) 03/31/2016  Elevated troponin 03/31/2016    GENERAL REVIEW OF SYSTEMS:   General ROS: negative for - chills, fatigue, fever, weight gain or weight loss Allergy and Immunology ROS: negative for - hives  Hematological and Lymphatic ROS: negative for - bleeding  problems or bruising, negative for palpable nodes Endocrine ROS: negative for - heat or cold intolerance, hair changes Respiratory ROS: Positive for - cough, shortness of breath or wheezing Cardiovascular ROS: Denies chest pain or palpitations GI ROS: negative for nausea, vomiting, abdominal pain, diarrhea, constipation Musculoskeletal ROS: negative for - joint swelling or muscle pain Neurological ROS: negative for - confusion, syncope Dermatological ROS: negative for pruritus and rash Psychiatric: negative for anxiety, depression, difficulty sleeping and memory loss  MEDICATIONS: Current Outpatient Medications  Medication Sig Dispense Refill   albuterol 90 mcg/actuation inhaler Inhale 2 inhalations into the lungs every 4 (four) hours as needed for Wheezing 1 Inhaler 3   allopurinoL (ZYLOPRIM) 100 MG tablet once daily   apixaban (ELIQUIS) 5 mg tablet once daily   atorvastatin (LIPITOR) 80 MG tablet Take 1 tablet (80 mg total) by mouth once daily (Patient taking differently: Take 80 mg by mouth nightly) 90 tablet 3   carvediloL (COREG) 6.25 MG tablet Take 1 tablet (6.25 mg total) by mouth every 12 (twelve) hours for 90 days 60 tablet 2   clotrimazole (LOTRIMIN) 1 % cream Apply topically as needed   dapagliflozin (FARXIGA) 10 mg tablet once daily   escitalopram oxalate (LEXAPRO) 5 MG tablet Take 5 mg by mouth nightly   fluticasone propion-salmeteroL (ADVAIR DISKUS) 250-50 mcg/dose diskus inhaler Inhale 1 inhalation into the lungs every 12 (twelve) hours 1 Inhaler 3   FUROsemide (LASIX) 20 MG tablet Take 2 tablets (40 mg total) by mouth once daily for 90 days (Patient taking differently: Take 40 mg by mouth 2 (two) times daily) 60 tablet 2   glipiZIDE (GLUCOTROL XL) 10 MG XL tablet Take 1 tablet by mouth once daily   insulin DETEMIR (LEVEMIR) injection (concentration 100 units/mL) Inject subcutaneously as  directed   magnesium oxide (MAG-OX) 400 mg (241.3 mg magnesium) tablet Take by mouth once  daily   metFORMIN (GLUCOPHAGE) 500 MG tablet Take 500 mg by mouth 2 (two) times daily with meals   miconazole 2 % cream Apply topically as needed   nitroGLYcerin (NITROSTAT) 0.4 MG SL tablet Place 1 tablet (0.4 mg total) under the tongue every 5 (five) minutes as needed for Chest pain May take up to 3 doses. 25 tablet 11   sacubitriL-valsartan (ENTRESTO) 97-103 mg tablet Take 1 tablet by mouth 2 (two) times daily   spironolactone (ALDACTONE) 25 MG tablet Take 1 tablet (25 mg total) by mouth once daily 90 tablet 2   valACYclovir (VALTREX) 500 MG tablet once daily   aspirin 81 MG EC tablet Take 81 mg by mouth once daily (Patient not taking: Reported on 02/18/2021)   empagliflozin (JARDIANCE) 10 mg tablet Take 1 tablet (10 mg total) by mouth daily with breakfast for 90 days 30 tablet 2   No current facility-administered medications for this visit.   ALLERGIES: Patient has no known allergies.  PAST MEDICAL HISTORY: Past Medical History:  Diagnosis Date   Atypical chest pain 03/31/2016   CHF (congestive heart failure) (CMS-HCC)   Diabetes mellitus type 2, uncomplicated (CMS-HCC)   Hypertension   Influenza A 09/09/2018   PAST SURGICAL HISTORY: Past Surgical History:  Procedure Laterality Date   FRACTURE SURGERY Left 1987  Leg    FAMILY HISTORY: Family History  Problem Relation Age of Onset   Diabetes Mother   Stroke Mother   Myocardial Infarction (Heart attack) Father   Lung cancer Father   Stroke Maternal Uncle   Diabetes Sister   Diabetes Sister   Diabetes Sister    SOCIAL HISTORY: Social History   Socioeconomic History   Marital status: Married  Tobacco Use   Smoking status: Current Every Day Smoker  Packs/day: 0.50  Types: Cigarettes   Smokeless tobacco: Never Used  Vaping Use   Vaping Use: Never used  Substance and Sexual Activity   Alcohol use: Yes  Comment: occasionally   Drug use: Not Currently  Comment: cocaine --- quit 2019   Sexual activity: Defer    PHYSICAL EXAM: Vitals:  02/18/21 1437  BP: 117/60  Pulse: 64   Body mass index is 32.93 kg/m. Weight: 92.5 kg (204 lb)   GENERAL: Alert, active, oriented x3  HEENT: Pupils equal reactive to light. Extraocular movements are intact. Sclera clear. Palpebral conjunctiva normal red color.Pharynx clear.  NECK: Supple with no palpable mass and no adenopathy.  LUNGS: Sound clear with no rales rhonchi or wheezes.  HEART: Regular rhythm S1 and S2 without murmur.  ABDOMEN: Soft and depressible, nontender with no palpable mass, no hepatomegaly. Reducible inguinal hernia, mild tender to palpation.  EXTREMITIES: Well-developed well-nourished symmetrical with no dependent edema.  NEUROLOGICAL: Awake alert oriented, facial expression symmetrical, moving all extremities.  REVIEW OF DATA: I have reviewed the following data today: Initial consult on 02/18/2021  Component Date Value   WBC (White Blood Cell Co* 02/18/2021 4.9   RBC (Red Blood Cell Coun* 02/18/2021 4.20 (!)   Hemoglobin 02/18/2021 13.3 (!)   Hematocrit 02/18/2021 40.0   MCV (Mean Corpuscular Vo* 02/18/2021 95.2   MCH (Mean Corpuscular He* 02/18/2021 31.7 (!)   MCHC (Mean Corpuscular H* 02/18/2021 33.3   Platelet Count 02/18/2021 207   RDW-CV (Red Cell Distrib* 02/18/2021 15.7 (!)   MPV (Mean Platelet Volum* 02/18/2021 9.8   Neutrophils 02/18/2021 1.95  Lymphocytes 02/18/2021 2.26   Monocytes 02/18/2021 0.57   Eosinophils 02/18/2021 0.08   Basophils 02/18/2021 0.04   Neutrophil % 02/18/2021 39.9   Lymphocyte % 02/18/2021 46.1   Monocyte % 02/18/2021 11.6   Eosinophil % 02/18/2021 1.6   Basophil% 02/18/2021 0.8   Immature Granulocyte % 02/18/2021 0.0   Immature Granulocyte Cou* 02/18/2021 0.00   Prothrombin Time 02/18/2021 13.2   Prothrombin INR 02/18/2021 1.1 (!)   aPTT - [facility] 02/18/2021 30    ASSESSMENT: Mr. Burdin is a 70 y.o. male presenting for consultation for right inguinal hernia.   The patient  presents with a symptomatic, reducible inguinal hernia. Patient was oriented about the diagnosis of inguinal hernia and its implication. The patient was oriented about the treatment alternatives (observation vs surgical repair). Due to patient symptoms, repair is recommended. Patient oriented about the surgical procedure, the use of mesh and its risk of complications such as: infection, bleeding, injury to vas deference, vasculature and testicle, injury to bowel or bladder, and chronic pain.   Due to patient cardiac history patient was evaluated by cardiology and clearance given. Note on chart.  Non-recurrent unilateral inguinal hernia without obstruction or gangrene [K40.90]  PLAN: 1. We will get cardiology clearance for inguinal hernia repair (done) 2. Robotic assisted laparoscopic right vs bilateral inguinal hernia  Patient and his wife verbalized understanding, all questions were answered, and were agreeable with the plan outlined above.   Carolan Shiver, MD

## 2021-03-03 NOTE — Op Note (Signed)
Preoperative diagnosis: Right inguinal hernia.   Postoperative diagnosis: Right inguinal hernia.  Procedure: Robotic assisted Laparoscopic Transabdominal preperitoneal laparoscopic (TAPP) repair of Right inguinal hernia.  Anesthesia: GETA  Surgeon: Dr. Hazle Quant  Wound Classification: Clean  Indications:  Patient is a 70 y.o. male developed a symptomatic right inguinal hernia. Repair was indicated.  Findings: 1. Right indirect Inguinal hernia identified   2. Vas deferens and cord structures identified and preserved 3. Bard 3D Max mesh used for repair   4. Adequate hemostasis.   Description of procedure: The patient was taken to the operating room and the correct side of surgery was verified. The patient was placed supine with arms tucked at the sides. After obtaining adequate anesthesia, the patient's abdomen was prepped and draped in standard sterile fashion. The patient was placed in the Trendelenburg position. A time-out was completed verifying correct patient, procedure, site, positioning, and implant(s) and/or special equipment prior to beginning this procedure. A Veress needle was placed at the umbilicus and pneumoperitoneum created with insufflation of carbon dioxide to 15 mmHg. After the Veress needle was removed, an 8-mm trocar was placed on epigastric area and the 30 angled laparoscope inserted. Two 8-mm trocars were then placed lateral to the rectus sheath under direct visualization. Both inguinal regions were inspected and the median umbilical ligament, medial umbilical ligament, and lateral umbilical fold were identified.  The robotic arms were docked. The robotic scope was inserted and the pelvic area anatomy targeted.  The peritoneum was incised with scissors along a line 5 cm above the superior edge of the hernia defect, extending from the median umbilical ligament to the anterior superior iliac spine. The peritoneal flap was mobilized inferiorly using blunt and sharp  dissection. The inferior epigastric vessels were exposed and the pubic symphysis was identified. Cooper's ligament was dissected to its junction with the iliac vein. The dissection was continued inferiorly to the iliopubic tract, with care taken to avoid injury to the femoral branch of the genitofemoral nerve and the lateral femoral cutaneous nerve. The cord structures were parietalized. The hernia was identified and reduced by gentle traction.  The indirect hernia sac was noted mobilized from the cord structures and reduced into the peritoneal cavity.  A large piece of mesh was rolled longitudinally into a compact cylinder and passed through a trocar. The cylinder was placed along the inferior aspect of the working space and unrolled into place to completely cover the direct, indirect, and femoral spaces. The mesh was secured into place superiorly to the anterior abdominal wall and inferiorly and medially to Cooper's ligament with absorbable sutures. Care was taken to avoid the inferolateral triangles containing the iliac vessels and genital nerves. The peritoneal flap was closed over the mesh and secured with suture in similar positions of safety. After ensuring adequate hemostasis, the trocars were removed and the pneumoperitoneum allowed to escape. The trocar incisions were closed using monocryl and skin adhesive dressings applied.  The patient tolerated the procedure well and was taken to the postanesthesia care unit in stable condition.   Specimen: None  Complications: None  Estimated Blood Loss: 5 mL

## 2021-03-03 NOTE — Anesthesia Procedure Notes (Signed)
Arterial Line Insertion Start/End7/13/2022 12:45 PM, 03/03/2021 1:02 PM Performed by: Corinda Gubler, MD, Henrietta Hoover, CRNA, anesthesiologist  Patient location: OR. Preanesthetic checklist: patient identified, IV checked, site marked, risks and benefits discussed, surgical consent, monitors and equipment checked, pre-op evaluation, timeout performed and anesthesia consent Lidocaine 1% used for infiltration and patient sedated Left, radial was placed Catheter size: 20 Fr Hand hygiene performed  and maximum sterile barriers used   Attempts: 3 Procedure performed using ultrasound guided technique. Ultrasound Notes:anatomy identified, needle tip was noted to be adjacent to the nerve/plexus identified and no ultrasound evidence of intravascular and/or intraneural injection Following insertion, dressing applied. Post procedure assessment: normal and unchanged  Post procedure complications: unsuccessful attempts. Patient tolerated the procedure well with no immediate complications.

## 2021-03-03 NOTE — Transfer of Care (Signed)
Immediate Anesthesia Transfer of Care Note  Patient: Jorge Cruz  Procedure(s) Performed: XI ROBOTIC ASSISTED vs Open INGUINAL HERNIA REPAIR WITH MESH (Right: Breast)  Patient Location: PACU  Anesthesia Type:General  Level of Consciousness: awake, drowsy and patient cooperative  Airway & Oxygen Therapy: Patient Spontanous Breathing and Patient connected to face mask oxygen  Post-op Assessment: Report given to RN and Post -op Vital signs reviewed and stable  Post vital signs: Reviewed and stable  Last Vitals:  Vitals Value Taken Time  BP 182/94 03/03/21 1428  Temp    Pulse 79 03/03/21 1432  Resp 15 03/03/21 1432  SpO2 99 % 03/03/21 1432  Vitals shown include unvalidated device data.  Last Pain:  Vitals:   03/03/21 1124  TempSrc: Oral  PainSc: 0-No pain         Complications: No notable events documented.

## 2021-03-03 NOTE — Anesthesia Procedure Notes (Signed)
Procedure Name: Intubation Date/Time: 03/03/2021 12:53 PM Performed by: Henrietta Hoover, CRNA Pre-anesthesia Checklist: Patient identified, Emergency Drugs available, Suction available and Patient being monitored Patient Re-evaluated:Patient Re-evaluated prior to induction Oxygen Delivery Method: Circle system utilized Preoxygenation: Pre-oxygenation with 100% oxygen Induction Type: IV induction and Cricoid Pressure applied Ventilation: Mask ventilation without difficulty and Oral airway inserted - appropriate to patient size Laryngoscope Size: McGraph and 4 Grade View: Grade I Tube type: Oral Tube size: 7.0 mm Number of attempts: 1 Airway Equipment and Method: Stylet and Video-laryngoscopy Placement Confirmation: ETT inserted through vocal cords under direct vision, positive ETCO2 and breath sounds checked- equal and bilateral Secured at: 23 cm Tube secured with: Tape Dental Injury: Teeth and Oropharynx as per pre-operative assessment

## 2021-03-03 NOTE — Discharge Instructions (Addendum)
  Diet: Resume home heart healthy regular diet.   Activity: No heavy lifting >20 pounds (children, pets, laundry, garbage) or strenuous activity until follow-up, but light activity and walking are encouraged. Do not drive or drink alcohol if taking narcotic pain medications.  Wound care: May shower with soapy water and pat dry (do not rub incisions), but no baths or submerging incision underwater until follow-up. (no swimming)   Medications: Resume all home medications. For mild to moderate pain: acetaminophen (Tylenol) or ibuprofen (if no kidney disease). Combining Tylenol with alcohol can substantially increase your risk of causing liver disease. Narcotic pain medications, if prescribed, can be used for severe pain, though may cause nausea, constipation, and drowsiness. Do not combine Tylenol and Norco within a 6 hour period as Norco contains Tylenol. If you do not need the narcotic pain medication, you do not need to fill the prescription.  Call office [phone]) at any time if any questions, worsening pain, fevers/chills, bleeding, drainage from incision site, or other concerns. AMBULATORY SURGERY  DISCHARGE INSTRUCTIONS   The drugs that you were given will stay in your system until tomorrow so for the next 24 hours you should not:  Drive an automobile Make any legal decisions Drink any alcoholic beverage   You may resume regular meals tomorrow.  Today it is better to start with liquids and gradually work up to solid foods.  You may eat anything you prefer, but it is better to start with liquids, then soup and crackers, and gradually work up to solid foods.   Please notify your doctor immediately if you have any unusual bleeding, trouble breathing, redness and pain at the surgery site, drainage, fever, or pain not relieved by medication.                            Additional Instructions:     If you are unable to void within 8 hours please come to the Emergency Department.

## 2021-03-04 ENCOUNTER — Encounter: Payer: Self-pay | Admitting: General Surgery

## 2021-04-29 ENCOUNTER — Other Ambulatory Visit: Payer: Self-pay | Admitting: Family

## 2021-04-29 MED ORDER — DAPAGLIFLOZIN PROPANEDIOL 10 MG PO TABS: 10.0000 mg | ORAL_TABLET | Freq: Every day | ORAL | 5 refills | Status: DC

## 2021-05-26 NOTE — Progress Notes (Signed)
Cardiology Office Note:    Date:  05/27/2021   ID:  Jorge Cruz, DOB [birth year], MRN [id number]  PCP:  [facility]  [facility] Cardiologist:  Yvonne Kendall, MD  [facility] Electrophysiologist:  None   Referring MD: [facility]   Chief Complaint: 3 month follow-up  History of Present Illness:    Jorge Cruz is a 70 y.o. male with a hx of  history of chronic HFrEF, nonobstructive coronary artery disease, hypertension, diabetes mellitus, chronic kidney disease, COPD, and polysubstance abuse, who presents for preoperative cardiovascular risk assessment in anticipation of inguinal hernia repair.  Seen in 04/2018, after which time he transitioned his cardiac care to [facility] (though he has continued to be seen by Clarisa Kindred, NP, in the [facility] HF clinic).  He subsequently transitioned his care to [facility] after being hospitalized there last fall due to HFrEF and acute hypercapnic respiratory failure requiring mechanical ventilation.  Echo 09/2019 showed LVEF 30-35%, global HK, trivial MR. Most recent echo at [facility] 06/2020 showed EF 25%.   Last seen 02/24/21 for pre-op assessment for elective hernia repair, ok to hold Eliquis 2 days before surgery. Reported intermittent pain and bulging from the right groin and some chest tightness. Consider increase in coreg at follow-up.   Today, the patient reports he has been doing well since the hernia repair. No chest pain or shortness of breath. No LLE, pand, orthopnea, palpitations. Denies bleeding issues with Eliquis. BP today 130s/70s. Takes lasix 20mg  daily.   Past Medical History:  Diagnosis Date   Chronic combined systolic (congestive) and diastolic (congestive) heart failure (HCC)    a. 03/2014 Echo: EF 45%, glob HK; b. 03/2016 Echo: EF 20-25%, diff HK, Gr1 DD, mild MR; b. 11/2017 Echo: EF 20%, diff HK, Gr2 DD, mildly to mod reduced RV fxn, PASP 12/2017.   CKD (chronic kidney disease), stage III (HCC)    CKD (chronic  kidney disease), stage III (HCC)    COPD (chronic obstructive pulmonary disease) (HCC)    Diabetes mellitus without complication (HCC)    Hypertensive heart disease    NICM (nonischemic cardiomyopathy) (HCC)    a. 03/2014 Echo: EF 45%, glob HK, mild conc LVH; b. 03/2014 MV: possible mild ischemia, superimposed on small inf infarct, EF 36%; c. 03/2016 MV: EF <30%, no ischemia; d. 03/2016 Echo: EF 20-25%, diff HK, Gr1 DD, mild MR; e. 05/2017 Cath: nonobs dzs, EF 20%; f. 11/2017 Echo: EF 20%, diff HK, Gr2 DD.   Non-obstructive CAD (coronary artery disease)    a. 05/2017 Cath: LM nl, LAD 22m, LCX 16m, RCA 29m, EF 20%.   Polysubstance abuse (HCC)    Tobacco abuse     Past Surgical History:  Procedure Laterality Date   RIGHT/LEFT HEART CATH AND CORONARY ANGIOGRAPHY N/A 06/01/2017   Procedure: RIGHT/LEFT HEART CATH AND CORONARY ANGIOGRAPHY;  Surgeon: 08/01/2017, MD;  Location: [facility] INVASIVE CV LAB;  Service: Cardiovascular;  Laterality: N/A;   XI ROBOTIC ASSISTED INGUINAL HERNIA REPAIR WITH MESH Right 03/03/2021   Procedure: XI ROBOTIC ASSISTED INGUINAL HERNIA REPAIR WITH MESH;  Surgeon: 03/05/2021, MD;  Location: [facility] ORS;  Service: General;  Laterality: Right;    Current Medications: Current Meds  Medication Sig   albuterol (PROVENTIL HFA;VENTOLIN HFA) 108 (90 Base) MCG/ACT inhaler Inhale 2 puffs into the lungs every 6 (six) hours as needed for wheezing or shortness of breath.   allopurinol (ZYLOPRIM) 100 MG tablet Take 1 tablet by mouth daily.   apixaban (ELIQUIS)  5 MG TABS tablet Take 5 mg by mouth 2 (two) times daily.   atorvastatin (LIPITOR) 80 MG tablet Take 80 mg by mouth daily.   carvedilol (COREG) 6.25 MG tablet Take 1 tablet (6.25 mg total) by mouth 2 (two) times daily.   dapagliflozin propanediol (FARXIGA) 10 MG TABS tablet Take 1 tablet (10 mg total) by mouth daily before breakfast.   Fluticasone-Salmeterol (ADVAIR) 250-50 MCG/DOSE AEPB Inhale 1 puff into the lungs 2  (two) times daily.   furosemide (LASIX) 20 MG tablet Take 1 tablet (20 mg total) by mouth daily.   ipratropium-albuterol (DUONEB) 0.5-2.5 (3) MG/3ML SOLN Take 3 mLs by nebulization every 4 (four) hours as needed.   magnesium oxide (MAG-OX) 400 MG tablet Take 1 tablet by mouth in the morning and at bedtime.   metFORMIN (GLUCOPHAGE) 1000 MG tablet Take 1,000 mg by mouth 2 (two) times daily.    nitroGLYCERIN (NITROSTAT) 0.4 MG SL tablet Place 0.4 mg under the tongue every 5 (five) minutes as needed for chest pain.   sacubitril-valsartan (ENTRESTO) 97-103 MG Take 1 tablet by mouth 2 (two) times daily.   spironolactone (ALDACTONE) 25 MG tablet Take 25 mg by mouth daily.   [DISCONTINUED] carvedilol (COREG) 3.125 MG tablet Take 1 tablet (3.125 mg total) by mouth 2 (two) times daily with a meal. MUST MAKE APPT for further refills     Allergies:   Patient has no known allergies.   Social History   Socioeconomic History   Marital status: Married    Spouse name: Not on file   Number of children: Not on file   Years of education: Not on file   Highest education level: Not on file  Occupational History   Not on file  Tobacco Use   Smoking status: Some Days    Packs/day: 0.50    Years: 52.00    Pack years: 26.00    Types: Cigarettes   Smokeless tobacco: Never  Vaping Use   Vaping Use: Never used  Substance and Sexual Activity   Alcohol use: Yes    Alcohol/week: 2.0 - 3.0 standard drinks    Types: 1 - 2 Cans of beer, 1 Shots of liquor per week    Comment: occasionally   Drug use: Yes    Types: Cocaine    Comment: 3 years   Sexual activity: Not Currently  Other Topics Concern   Not on file  Social History Narrative   Not on file   Social Determinants of Health   Financial Resource Strain: Not on file  Food Insecurity: Not on file  Transportation Needs: Not on file  Physical Activity: Not on file  Stress: Not on file  Social Connections: Not on file     Family History: The  patient's family history includes Diabetes in his father and mother.  ROS:   Please see the history of present illness.     All other systems reviewed and are negative.  EKGs/Labs/Other Studies Reviewed:    The following studies were reviewed today:  Echo 06/2020 LEFT VENTRICLE                                      Anterior: HYPOCONTRACTILE          Size: MODERATELY ENLARGED                    Lateral: HYPOCONTRACTILE   Contraction: SEVERE  GLOBAL DECREASE                  Septal: HYPOCONTRACTILE    Closest EF: 25% (Estimated)  Calc.EF: 27% (2D)      Apical: HYPOCONTRACTILE     LV masses: No Masses                             Inferior: HYPOCONTRACTILE           LVH: None                                 Posterior: HYPOCONTRACTILE  Dias.FxClass: INDETERMINATE    INTERPRETATION ---------------------------------------------------------------    SEVERE LV DYSFUNCTION (See above)    MILD RV SYSTOLIC DYSFUNCTION (See above)    VALVULAR REGURGITATION: TRIVIAL MR, TRIVIAL PR, TRIVIAL TR    NO VALVULAR STENOSIS    RV POORLY VISUALIZED BUT FUNCTION NOW APPEARS MILDLY REDUCED     Compared with prior Echo study on 09/10/2018: NO SIGNIFICANT CHANGES.   Echo 09/2019   1. Left ventricular ejection fraction, by estimation, is 30 to 35%. The  left ventricle has moderately decreased function. The left ventricle  demonstrates global hypokinesis. The left ventricular internal cavity size  was moderately dilated. Left  ventricular diastolic parameters were normal.   2. Right ventricular systolic function is low normal. The right  ventricular size is mildly enlarged. There is normal pulmonary artery  systolic pressure.   3. The mitral valve is normal in structure and function. Trivial mitral  valve regurgitation.   4. The aortic valve is normal in structure and function. Aortic valve  regurgitation is trivial.   Echo 11/2017 Study Conclusions   - Left ventricle: The cavity size was mildly dilated.  There was    mild concentric hypertrophy. Systolic function was severely    reduced. The estimated ejection fraction was less than 20%.    Diffuse hypokinesis. Regional wall motion abnormalities cannot be    excluded. Features are consistent with a pseudonormal left    ventricular filling pattern, with concomitant abnormal relaxation    and increased filling pressure (grade 2 diastolic dysfunction).  - Left atrium: The atrium was normal in size.  - Right ventricle: Systolic function was mildly to moderately    reduced.  - Pulmonary arteries: Systolic pressure was mildly elevated PA peak    pressure: 35 mm Hg (S).   R/L heart cath 2018 Conclusion    There is severe left ventricular systolic dysfunction. LV end diastolic pressure is normal. Mid RCA lesion, 30 %stenosed. Mid Cx lesion, 30 %stenosed. Mid LAD lesion, 30 %stenosed.   1. Mild nonobstructive coronary artery disease. 2. Severely reduced LV systolic function with an ejection fraction of 20%. 3. Right heart catheterization showed normal filling pressures, normal pulmonary pressure and moderately reduced cardiac output. Cardiac output was 3.82 with cardiac index of 1.83. Pulmonary capillary wedge pressure was 5 mmHg.   Recommendations: Continue medical therapy for nonischemic cardiomyopathy. The patient does not appear to be volume overloaded. Resume small dose furosemide tomorrow. We can consider adding digoxin and spironolactone in the future but that has to be done in the outpatient setting once we make sure good compliance.   EKG:  EKG is  ordered today.  The ekg ordered today demonstrates NSR, 71bpm, PVC, nonspecific T wave changes  Recent Labs: 10/29/2020: ALT 13; B Natriuretic Peptide 259.7;  BUN 15; Creatinine, Ser 1.16; Hemoglobin 13.8; Platelets 208; Potassium 4.7; Sodium 141  Recent Lipid Panel    Component Value Date/Time   CHOL 151 04/01/2016 1240   TRIG 363 (H) 11/27/2017 0524   HDL 46 04/01/2016 1240    CHOLHDL 3.3 04/01/2016 1240   VLDL 6 04/01/2016 1240   LDLCALC 99 04/01/2016 1240     Physical Exam:    VS:  BP 134/74 (BP Location: Left Arm, Patient Position: Sitting, Cuff Size: Normal)   Pulse 71   Ht 5' 6.5" (1.689 m)   Wt 216 lb (98 kg)   SpO2 97%   BMI 34.34 kg/m     Wt Readings from Last 3 Encounters:  05/27/21 216 lb (98 kg)  03/03/21 207 lb 0.2 oz (93.9 kg)  03/01/21 207 lb (93.9 kg)     GEN:  Well nourished, well developed in no acute distress HEENT: Normal NECK: No JVD; No carotid bruits LYMPHATICS: No lymphadenopathy CARDIAC: RRR, no murmurs, rubs, gallops RESPIRATORY:  Clear to auscultation without rales, wheezing or rhonchi  ABDOMEN: Soft, non-tender, non-distended MUSCULOSKELETAL:  No edema; No deformity  SKIN: Warm and dry NEUROLOGIC:  Alert and oriented x 3 PSYCHIATRIC:  Normal affect   ASSESSMENT:    1. Chronic HFrEF (heart failure with reduced ejection fraction) (HCC)   2. Nonischemic cardiomyopathy (HCC)   3. Coronary artery disease involving native coronary artery of native heart without angina pectoris   4. Essential hypertension    PLAN:    In order of problems listed above:  HFrEF NICM Most recent echo 06/2020 showed EF 25%. He is euvolemic on exam. I will Increase coreg to 6.25mg  BID. Continue GDMT with Clifton Custard, spironolactone. Plan to repeat echo at follow-up. If EF still low will refer to EP for ICD consideration.   Nonobstructive CAD Mild CAD noted by cardiac cath in 2018. Patient denies chest pain or SOB. No further work-up indicated at thist time. Continue BB and statin. No ASA with Eliquis.   PAF EKG shows SR today. Continue Eliquis for CHADSAVASC at least 5.   Disposition: Follow up in 3 month(s) with MD/APP     Signed, Jazlynne Milliner David Stall, PA-C  05/27/2021 2:23 PM    [facility]

## 2021-05-27 ENCOUNTER — Other Ambulatory Visit: Payer: Self-pay

## 2021-05-27 ENCOUNTER — Ambulatory Visit (INDEPENDENT_AMBULATORY_CARE_PROVIDER_SITE_OTHER): Payer: 59 | Admitting: Medical

## 2021-05-27 ENCOUNTER — Encounter: Payer: Self-pay | Admitting: Medical

## 2021-05-27 VITALS — BP 134/74 | HR 71 | Ht 66.5 in | Wt 216.0 lb

## 2021-05-27 DIAGNOSIS — I428 Other cardiomyopathies: Secondary | ICD-10-CM

## 2021-05-27 DIAGNOSIS — I5022 Chronic systolic (congestive) heart failure: Secondary | ICD-10-CM

## 2021-05-27 DIAGNOSIS — I2510 Atherosclerotic heart disease of native coronary artery without angina pectoris: Secondary | ICD-10-CM | POA: Diagnosis not present

## 2021-05-27 DIAGNOSIS — I10 Essential (primary) hypertension: Secondary | ICD-10-CM | POA: Diagnosis not present

## 2021-05-27 MED ORDER — CARVEDILOL 6.25 MG PO TABS: 6.2500 mg | ORAL_TABLET | Freq: Two times a day (BID) | ORAL | 1 refills | Status: DC

## 2021-05-27 NOTE — Patient Instructions (Addendum)
Medication Instructions:  - Your physician has recommended you make the following change in your medication:   1) INCREASE coreg (carvedilol) to 6.25 mg - take 1 tablet by mouth TWICE daily   *If you need a refill on your cardiac medications before your next appointment, please call your pharmacy*   Lab Work: - none ordered  If you have labs (blood work) drawn today and your tests are completely normal, you will receive your results only by: MyChart Message (if you have MyChart) OR A paper copy in the mail If you have any lab test that is abnormal or we need to change your treatment, we will call you to review the results.   Testing/Procedures: - Your physician has requested that you have an echocardiogram in 2-3 months. Echocardiography is a painless test that uses sound waves to create images of your heart. It provides your doctor with information about the size and shape of your heart and how well your heart's chambers and valves are working. This procedure takes approximately one hour. There are no restrictions for this procedure. There is a possibility that an IV may need to be started during your test to inject an image enhancing agent. This is done to obtain more optimal pictures of your heart. Therefore we ask that you do at least drink some water prior to coming in to hydrate your veins.    Follow-Up: At [facility], you and your health needs are our priority.  As part of our continuing mission to provide you with exceptional heart care, we have created designated Provider Care Teams.  These Care Teams include your primary Cardiologist (physician) and Advanced Practice Providers (APPs -  Physician Assistants and Nurse Practitioners) who all work together to provide you with the care you need, when you need it.  We recommend signing up for the patient portal called "MyChart".  Sign up information is provided on this After Visit Summary.  MyChart is used to connect with patients  for Virtual Visits (Telemedicine).  Patients are able to view lab/test results, encounter notes, upcoming appointments, etc.  Non-urgent messages can be sent to your provider as well.   To learn more about what you can do with MyChart, go to [email or web address].    Your next appointment:   3 month(s)  The format for your next appointment:   In Person  Provider:   You may see Yvonne Kendall, MD or one of the following Advanced Practice Providers on your designated Care Team:   Nicolasa Ducking, NP Eula Listen, PA-C Marisue Ivan, PA-C Cadence Fransico Michael, [place]   Other Instructions  Echocardiogram An echocardiogram is a test that uses sound waves (ultrasound) to produce images of the heart. Images from an echocardiogram can provide important information about: Heart size and shape. The size and thickness and movement of your heart's walls. Heart muscle function and strength. Heart valve function or if you have stenosis. Stenosis is when the heart valves are too narrow. If blood is flowing backward through the heart valves (regurgitation). A tumor or infectious growth around the heart valves. Areas of heart muscle that are not working well because of poor blood flow or injury from a heart attack. Aneurysm detection. An aneurysm is a weak or damaged part of an artery wall. The wall bulges out from the normal force of blood pumping through the body. Tell a health care provider about: Any allergies you have. All medicines you are taking, including vitamins, herbs, eye drops, creams, and over-the-counter medicines.  Any blood disorders you have. Any surgeries you have had. Any medical conditions you have. Whether you are pregnant or may be pregnant. What are the risks? Generally, this is a safe test. However, problems may occur, including an allergic reaction to dye (contrast) that may be used during the test. What happens before the test? No specific preparation is needed. You may  eat and drink normally. What happens during the test?  You will take off your clothes from the waist up and put on a hospital gown. Electrodes or electrocardiogram (ECG)patches may be placed on your chest. The electrodes or patches are then connected to a device that monitors your heart rate and rhythm. You will lie down on a table for an ultrasound exam. A gel will be applied to your chest to help sound waves pass through your skin. A handheld device, called a transducer, will be pressed against your chest and moved over your heart. The transducer produces sound waves that travel to your heart and bounce back (or "echo" back) to the transducer. These sound waves will be captured in real-time and changed into images of your heart that can be viewed on a video monitor. The images will be recorded on a computer and reviewed by your health care provider. You may be asked to change positions or hold your breath for a short time. This makes it easier to get different views or better views of your heart. In some cases, you may receive contrast through an IV in one of your veins. This can improve the quality of the pictures from your heart. The procedure may vary among health care providers and hospitals. What can I expect after the test? You may return to your normal, everyday life, including diet, activities, and medicines, unless your health care provider tells you not to do that. Follow these instructions at home: It is up to you to get the results of your test. Ask your health care provider, or the department that is doing the test, when your results will be ready. Keep all follow-up visits. This is important. Summary An echocardiogram is a test that uses sound waves (ultrasound) to produce images of the heart. Images from an echocardiogram can provide important information about the size and shape of your heart, heart muscle function, heart valve function, and other possible heart problems. You do  not need to do anything to prepare before this test. You may eat and drink normally. After the echocardiogram is completed, you may return to your normal, everyday life, unless your health care provider tells you not to do that. This information is not intended to replace advice given to you by your health care provider. Make sure you discuss any questions you have with your health care provider. Document Revised: 03/31/2020 Document Reviewed: 03/31/2020 Elsevier Patient Education  2022 [employer].

## 2021-06-17 ENCOUNTER — Other Ambulatory Visit (HOSPITAL_BASED_OUTPATIENT_CLINIC_OR_DEPARTMENT_OTHER): Payer: Self-pay | Admitting: Addiction Medicine

## 2021-06-17 ENCOUNTER — Other Ambulatory Visit: Payer: Self-pay | Admitting: Addiction Medicine

## 2021-06-17 DIAGNOSIS — Z87891 Personal history of nicotine dependence: Secondary | ICD-10-CM

## 2021-06-22 ENCOUNTER — Ambulatory Visit: Admission: RE | Admit: 2021-06-22 | Payer: 59 | Source: Ambulatory Visit

## 2021-06-27 NOTE — Progress Notes (Signed)
Patient ID: Jorge Cruz, male    DOB: Apr 21, 1951, 70 y.o.   MRN: [id number]  HPI  Jorge Cruz is Cruz 70 y/o male with Cruz history of DM, COPD, HTN, current tobacco use and chronic heart failure.  Echo report from 07/06/20 reviewed and showed an EF of 25%. Echo report from 10/11/19 reviewed and showed an EF of 30-35% along with trivial Jorge. Echo report from 11/25/17 reviewed and showed an EF of 20-25% along with Cruz mildly elevated PA pressure of 35 mm Hg. Echo report from 04/01/16 was reviewed and shows an EF of 20-25% along with mild Jorge.   Cardiac catheterization was done 06/01/17 which showed an EF of 20% along with mild nonobstructive CAD. RHC also done which showed normal filling pressures, normal pulmonary pressure and moderately reduced cardiac output. Cardiac output was 3.82 with Cruz cardiac index of 1.83.  Has not been admitted or been in the ED in the last 6 months.   He presents today for Cruz follow-up visit with Cruz chief complaint of minimal fatigue upon moderate exertion. He describes this as chronic in nature having been present for several years. He has associated intermittent cough, shortness of breath, wheezing and gradual weight gain along with this. He denies any difficulty sleeping, dizziness, chest pain, pedal edema, palpitations or abdominal distention.   He says that he only got ~ 1 hour of sleep last night because his wife was taken to the ER last night and he stayed with her. He says that he's planning on resuming working FT because of financial reasons.   Has upcoming echo scheduled. Denies any shocks from his ICD.   Past Medical History:  Diagnosis Date   Chronic combined systolic (congestive) and diastolic (congestive) heart failure ([place])    Cruz. 03/2014 Echo: EF 45%, glob HK; b. 03/2016 Echo: EF 20-25%, diff HK, Gr1 DD, mild Jorge; b. 11/2017 Echo: EF 20%, diff HK, Gr2 DD, mildly to mod reduced RV fxn, PASP 44mmHg.   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 03/2014 Echo: EF 45%, glob HK, mild conc LVH; b. 03/2014 MV: possible mild ischemia, superimposed on small inf infarct, EF 36%; c. 03/2016 MV: EF <30%, no ischemia; d. 03/2016 Echo: EF 20-25%, diff HK, Gr1 DD, mild Jorge; e. 05/2017 Cath: nonobs dzs, EF 20%; f. 11/2017 Echo: EF 20%, diff HK, Gr2 DD.   Non-obstructive CAD (coronary artery disease)    Cruz. 05/2017 Cath: LM nl, LAD 73m, LCX 1m, RCA 81m, EF 20%.   Polysubstance abuse ([place])    Tobacco abuse     Past Surgical History:  Procedure Laterality Date   RIGHT/LEFT HEART CATH AND CORONARY ANGIOGRAPHY N/[place] 06/01/2017   Procedure: RIGHT/LEFT HEART CATH AND CORONARY ANGIOGRAPHY;  Surgeon: Jorge Hampshire, MD;  Location: [facility];  Service: Cardiovascular;  Laterality: N/Cruz;   XI ROBOTIC ASSISTED INGUINAL HERNIA REPAIR WITH MESH Right 03/03/2021   Procedure: XI ROBOTIC ASSISTED INGUINAL HERNIA REPAIR WITH MESH;  Surgeon: Jorge Pun, MD;  Location: [facility];  Service: General;  Laterality: Right;   Family History  Problem Relation Age of Onset   Diabetes Mother    Diabetes Father    Social  History   Tobacco Use   Smoking status: Some Days    Packs/day: 0.50    Years: 52.00    Pack years: 26.00    Types: Cigarettes   Smokeless tobacco: Never  Substance Use Topics   Alcohol use: Yes    Alcohol/week: 2.0 - 3.0 standard drinks    Types: 1 - 2 Cans of beer, 1 Shots of liquor per week    Comment: occasionally   No Known Allergies  Prior to Admission medications   Medication Sig Start Date End Date Taking? Authorizing Provider  albuterol (PROVENTIL HFA;VENTOLIN HFA) 108 (90 Base) MCG/ACT inhaler Inhale 2 puffs into the lungs every 6 (six) hours as  needed for wheezing or shortness of breath.   Yes [provider]  allopurinol (ZYLOPRIM) 100 MG tablet Take 1 tablet by mouth daily. 11/17/20  Yes [provider]  apixaban (ELIQUIS) 5 MG TABS tablet Take 5 mg by mouth 2 (two) times daily. 01/20/21  Yes [provider]  atorvastatin (LIPITOR) 80 MG tablet Take 80 mg by mouth daily.   Yes [provider]  carvedilol (COREG) 6.25 MG tablet Take 1 tablet (6.25 mg total) by mouth 2 (two) times daily. 05/27/21  Yes [place], Jorge H, PA-C  dapagliflozin propanediol (FARXIGA) 10 MG TABS tablet Take 1 tablet (10 mg total) by mouth daily before breakfast. 04/29/21  Yes Jorge Ailes A, FNP  Fluticasone-Salmeterol (ADVAIR) 250-50 MCG/DOSE AEPB Inhale 1 puff into the lungs 2 (two) times daily. 03/17/20  Yes [provider]  furosemide (LASIX) 20 MG tablet Take 1 tablet (20 mg total) by mouth daily. 10/13/19  Yes Jorge Shadow, MD  magnesium oxide (MAG-OX) 400 MG tablet Take 1 tablet by mouth in the morning and at bedtime. 01/20/21 01/20/22 Yes [provider]  metFORMIN (GLUCOPHAGE) 1000 MG tablet Take 500 mg by mouth 2 (two) times daily.   Yes [provider]  nitroGLYCERIN (NITROSTAT) 0.4 MG SL tablet Place 0.4 mg under the tongue every 5 (five) minutes as needed for chest pain.   Yes [provider]  sacubitril-valsartan (ENTRESTO) 97-103 MG Take 1 tablet by mouth 2 (two) times daily. 04/14/20  Yes Dangelo [place], Jorge Fermo A, FNP  spironolactone (ALDACTONE) 25 MG tablet Take 25 mg by mouth daily.   Yes [provider]  ipratropium-albuterol (DUONEB) 0.5-2.5 (3) MG/3ML SOLN Take 3 mLs by nebulization every 4 (four) hours as needed. Patient not taking: Reported on 06/29/2021 12/01/17   Jorge Bilberry, MD    Review of Systems  Constitutional:  Positive for fatigue (at times). Negative for appetite change.  HENT:  Negative for congestion, rhinorrhea and sore throat.   Eyes: Negative.   Respiratory:   Positive for cough (dry ), shortness of breath (infrequent) and wheezing. Negative for chest tightness.   Cardiovascular:  Negative for chest pain, palpitations and leg swelling.  Gastrointestinal:  Negative for abdominal distention and abdominal pain.  Endocrine: Negative.   Genitourinary: Negative.   Musculoskeletal:  Negative for arthralgias, back pain and neck pain.  Skin: Negative.   Allergic/Immunologic: Negative.   Neurological:  Negative for dizziness and light-headedness.  Hematological:  Negative for adenopathy. Does not bruise/bleed easily.  Psychiatric/Behavioral:  Negative for dysphoric mood and sleep disturbance (sleeping on 2 pillows). The patient is not nervous/anxious.    Vitals:   06/29/21 1203  BP: 133/64  Pulse: 76  Resp: 18  SpO2: 100%  Weight: 216 lb (98 kg)  Height: 5\' 7"  (1.702 m)   Wt Readings  from Last 3 Encounters:  06/29/21 216 lb (98 kg)  05/27/21 216 lb (98 kg)  03/03/21 207 lb 0.2 oz (93.9 kg)   Lab Results  Component Value Date   CREATININE 1.16 10/29/2020   CREATININE 1.33 (H) 04/12/2020   CREATININE 1.16 04/10/2020   Physical Exam Vitals and nursing note reviewed.  Constitutional:      Appearance: He is well-developed.  HENT:     Head: Normocephalic and atraumatic.  Neck:     Vascular: No JVD.  Cardiovascular:     Rate and Rhythm: Normal rate and regular rhythm.  Pulmonary:     Effort: Pulmonary effort is normal.     Breath sounds: No wheezing or rales.  Abdominal:     General: There is no distension.     Palpations: Abdomen is soft.     Tenderness: There is no abdominal tenderness.  Musculoskeletal:        General: No tenderness.     Cervical back: Normal range of motion and neck supple.  Skin:    General: Skin is warm and dry.  Neurological:     Mental Status: He is alert and oriented to person, place, and time.  Psychiatric:        Behavior: Behavior normal.        Thought Content: Thought content normal.   Assessment &  Plan:  1: Chronic heart failure with reduced ejection fraction- - NYHA class II - euvolemic today - weighing himself daily; reminded to call for an overnight weight gain of >2 pounds or Cruz weekly weight gain of >5 pounds - weight up 11 pounds since he was last here 7.5 months ago - not adding salt and has been reading food labels. Reviewed the importance of keeping sodium intake to 2000mg  daily.  - saw cardiology Kathlen Mody) 05/27/21 & carvedilol was increased to 6.25mg  BID - on GDMT of carvedilol, farxiga, entresto and spironolactone - has echo scheduled on 08/03/21 - had ICD implantation 01/02/20; denies any firing of ICD - BNP 10/29/20 was 259.7 - reports receiving his flu vaccine for this season  2: HTN- - BP looks good (133/64) - BMP from 10/29/20 reviewed and shows sodium 141, potassium 4.7, creatinine 1.16 and GFR >60 - sees PCP [facility])   3: DM- - says that glucose at home was 110 - A1c on 04/11/20 was 6.0%  4:Tobacco use-  - smoking 1/2-1 ppd of cigarettes - has ~ 4 ounces of [place] at bedtime but not on Cruz nightly basis - complete cessation discussed for 3 minutes with the patient   Medication bottles reviewed.   Return in 6 months or sooner for any questions/problems before then.

## 2021-06-28 ENCOUNTER — Ambulatory Visit: Payer: 59 | Admitting: Family

## 2021-06-29 ENCOUNTER — Ambulatory Visit: Payer: 59 | Attending: Family | Admitting: Family

## 2021-06-29 ENCOUNTER — Other Ambulatory Visit: Payer: Self-pay

## 2021-06-29 ENCOUNTER — Encounter: Payer: Self-pay | Admitting: Family

## 2021-06-29 VITALS — BP 133/64 | HR 76 | Resp 18 | Ht 67.0 in | Wt 216.0 lb

## 2021-06-29 DIAGNOSIS — I5042 Chronic combined systolic (congestive) and diastolic (congestive) heart failure: Secondary | ICD-10-CM | POA: Insufficient documentation

## 2021-06-29 DIAGNOSIS — E119 Type 2 diabetes mellitus without complications: Secondary | ICD-10-CM | POA: Diagnosis not present

## 2021-06-29 DIAGNOSIS — Z596 Low income: Secondary | ICD-10-CM | POA: Diagnosis not present

## 2021-06-29 DIAGNOSIS — Z720 Tobacco use: Secondary | ICD-10-CM

## 2021-06-29 DIAGNOSIS — N183 Chronic kidney disease, stage 3 unspecified: Secondary | ICD-10-CM | POA: Insufficient documentation

## 2021-06-29 DIAGNOSIS — J449 Chronic obstructive pulmonary disease, unspecified: Secondary | ICD-10-CM | POA: Diagnosis not present

## 2021-06-29 DIAGNOSIS — E1122 Type 2 diabetes mellitus with diabetic chronic kidney disease: Secondary | ICD-10-CM | POA: Insufficient documentation

## 2021-06-29 DIAGNOSIS — Z95810 Presence of automatic (implantable) cardiac defibrillator: Secondary | ICD-10-CM | POA: Diagnosis not present

## 2021-06-29 DIAGNOSIS — F17210 Nicotine dependence, cigarettes, uncomplicated: Secondary | ICD-10-CM | POA: Diagnosis not present

## 2021-06-29 DIAGNOSIS — Z5986 Financial insecurity: Secondary | ICD-10-CM | POA: Diagnosis not present

## 2021-06-29 DIAGNOSIS — I130 Hypertensive heart and chronic kidney disease with heart failure and stage 1 through stage 4 chronic kidney disease, or unspecified chronic kidney disease: Secondary | ICD-10-CM | POA: Insufficient documentation

## 2021-06-29 DIAGNOSIS — F1090 Alcohol use, unspecified, uncomplicated: Secondary | ICD-10-CM | POA: Insufficient documentation

## 2021-06-29 DIAGNOSIS — I10 Essential (primary) hypertension: Secondary | ICD-10-CM

## 2021-06-29 DIAGNOSIS — I2510 Atherosclerotic heart disease of native coronary artery without angina pectoris: Secondary | ICD-10-CM | POA: Insufficient documentation

## 2021-06-29 DIAGNOSIS — I5022 Chronic systolic (congestive) heart failure: Secondary | ICD-10-CM

## 2021-06-29 NOTE — Patient Instructions (Signed)
Continue weighing daily and call for an overnight weight gain of > 2 pounds or a weekly weight gain of >5 pounds. 

## 2021-08-20 ENCOUNTER — Other Ambulatory Visit: Payer: Self-pay

## 2021-08-20 ENCOUNTER — Ambulatory Visit (INDEPENDENT_AMBULATORY_CARE_PROVIDER_SITE_OTHER): Payer: 59

## 2021-08-20 DIAGNOSIS — I5022 Chronic systolic (congestive) heart failure: Secondary | ICD-10-CM | POA: Diagnosis not present

## 2021-08-20 DIAGNOSIS — I428 Other cardiomyopathies: Secondary | ICD-10-CM | POA: Diagnosis not present

## 2021-08-20 LAB — ECHOCARDIOGRAM COMPLETE
AR max vel: 2.36 cm2
AV Area VTI: 1.92 cm2
AV Area mean vel: 2.16 cm2
AV Mean grad: 3 mmHg
AV Peak grad: 6.6 mmHg
Ao pk vel: 1.28 m/s
Area-P 1/2: 2.39 cm2
S' Lateral: 5.4 cm
Single Plane A4C EF: 24.5 %

## 2021-08-20 MED ORDER — PERFLUTREN LIPID MICROSPHERE
1.0000 mL | INTRAVENOUS | Status: AC | PRN
  Administered 2021-08-20: 2 mL via INTRAVENOUS

## 2021-09-02 ENCOUNTER — Other Ambulatory Visit: Payer: Self-pay

## 2021-09-02 ENCOUNTER — Ambulatory Visit (INDEPENDENT_AMBULATORY_CARE_PROVIDER_SITE_OTHER): Payer: Commercial Managed Care - HMO | Admitting: Internal Medicine

## 2021-09-02 ENCOUNTER — Encounter: Payer: Self-pay | Admitting: Internal Medicine

## 2021-09-02 VITALS — BP 130/80 | HR 81 | Ht 67.5 in | Wt 219.0 lb

## 2021-09-02 DIAGNOSIS — I5022 Chronic systolic (congestive) heart failure: Secondary | ICD-10-CM

## 2021-09-02 DIAGNOSIS — I10 Essential (primary) hypertension: Secondary | ICD-10-CM

## 2021-09-02 DIAGNOSIS — I428 Other cardiomyopathies: Secondary | ICD-10-CM | POA: Diagnosis not present

## 2021-09-02 DIAGNOSIS — I2510 Atherosclerotic heart disease of native coronary artery without angina pectoris: Secondary | ICD-10-CM | POA: Diagnosis not present

## 2021-09-02 DIAGNOSIS — I480 Paroxysmal atrial fibrillation: Secondary | ICD-10-CM

## 2021-09-02 DIAGNOSIS — E785 Hyperlipidemia, unspecified: Secondary | ICD-10-CM

## 2021-09-02 DIAGNOSIS — Z79899 Other long term (current) drug therapy: Secondary | ICD-10-CM

## 2021-09-02 DIAGNOSIS — E1169 Type 2 diabetes mellitus with other specified complication: Secondary | ICD-10-CM

## 2021-09-02 MED ORDER — CARVEDILOL 12.5 MG PO TABS: 12.5000 mg | ORAL_TABLET | Freq: Two times a day (BID) | ORAL | 3 refills | Status: DC

## 2021-09-02 MED ORDER — CARVEDILOL 12.5 MG PO TABS: 12.5000 mg | ORAL_TABLET | Freq: Two times a day (BID) | ORAL | 1 refills | Status: DC

## 2021-09-02 NOTE — Progress Notes (Signed)
Follow-up Outpatient Visit Date: 09/02/2021  Primary Care Provider: [facility] [place] [address] [place] [address]  Chief Complaint: Follow-up heart failure  HPI:  Mr. Dryden is a 71 y.o. male with history of chronic HFrEF status post ICD (monitored through [facility]), nonobstructive coronary artery disease, paroxysmal atrial fibrillation, hypertension, diabetes mellitus, chronic kidney disease, COPD, and polysubstance abuse, who presents for follow-up of HFrEF and CAD.  He was last seen in our office in October by Tarri Glenn, [name], at which time carvedilol was increased to 6.25 mg twice daily.  Echocardiogram 2 weeks ago showed LVEF 30-35% with grade 1 diastolic dysfunction as well as mild RV dysfunction and enlargement.  Today, Mr. Aurich reports that he has been feeling well.  He reports 1 episode of a brief shocking sensation near his ICD.  He does not report feeling a shock in his chest nor passing out.  There were no prodromal symptoms.  He has not had any chest pain, shortness of breath, palpitations, lightheadedness, or edema.  He is tolerating his medications well.  --------------------------------------------------------------------------------------------------  Past Medical History:  Diagnosis Date   Chronic combined systolic (congestive) and diastolic (congestive) heart failure ([place])    a. 03/2014 Echo: EF 45%, glob HK; b. 03/2016 Echo: EF 20-25%, diff HK, Gr1 DD, mild MR; b. 11/2017 Echo: EF 20%, diff HK, Gr2 DD, mildly to mod reduced RV fxn, PASP 70mmHg.   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 03/2014 Echo: EF 45%, glob HK, mild conc LVH; b. 03/2014 MV:  possible mild ischemia, superimposed on small inf infarct, EF 36%; c. 03/2016 MV: EF <30%, no ischemia; d. 03/2016 Echo: EF 20-25%, diff HK, Gr1 DD, mild MR; e. 05/2017 Cath: nonobs dzs, EF 20%; f. 11/2017 Echo: EF 20%, diff HK, Gr2 DD.   Non-obstructive CAD (coronary artery disease)    a. 05/2017 Cath: LM nl, LAD 100m, LCX 6m, RCA 26m, EF 20%.   Paroxysmal atrial fibrillation (HCC)    Polysubstance abuse ([place])    Tobacco abuse    Past Surgical History:  Procedure Laterality Date   RIGHT/LEFT HEART CATH AND CORONARY ANGIOGRAPHY N/A 06/01/2017   Procedure: RIGHT/LEFT HEART CATH AND CORONARY ANGIOGRAPHY;  Surgeon: Wellington Hampshire, MD;  Location: [facility];  Service: Cardiovascular;  Laterality: N/A;   XI ROBOTIC ASSISTED INGUINAL HERNIA REPAIR WITH MESH Right 03/03/2021   Procedure: XI ROBOTIC ASSISTED INGUINAL HERNIA REPAIR WITH MESH;  Surgeon: Herbert Pun, MD;  Location: [facility];  Service: General;  Laterality: Right;    Current Meds  Medication Sig   albuterol (PROVENTIL HFA;VENTOLIN HFA) 108 (90 Base) MCG/ACT inhaler Inhale 2 puffs into the lungs every 6 (six) hours as needed for wheezing or shortness of breath.   allopurinol (ZYLOPRIM) 100 MG tablet Take 1 tablet by mouth daily.   apixaban (ELIQUIS) 5 MG TABS tablet Take 5 mg by mouth 2 (two) times daily.   atorvastatin (LIPITOR) 80 MG tablet Take 80 mg by mouth daily.   carvedilol (COREG) 6.25 MG tablet Take 1 tablet (6.25 mg total) by mouth 2 (two) times daily.   dapagliflozin propanediol (FARXIGA) 10 MG TABS tablet Take 1 tablet (10 mg total)  by mouth daily before breakfast.   Fluticasone-Salmeterol (ADVAIR) 250-50 MCG/DOSE AEPB Inhale 1 puff into the lungs 2 (two) times daily.   furosemide (LASIX) 20 MG tablet Take 20 mg by mouth daily as needed.   magnesium oxide (MAG-OX) 400 MG tablet Take 1 tablet by mouth in the morning and at bedtime.   metFORMIN (GLUCOPHAGE) 1000 MG tablet Take 500 mg by mouth 2 (two) times  daily.   metFORMIN (GLUCOPHAGE) 500 MG tablet Take 500 mg by mouth 2 (two) times daily.   nitroGLYCERIN (NITROSTAT) 0.4 MG SL tablet Place 0.4 mg under the tongue every 5 (five) minutes as needed for chest pain.   sacubitril-valsartan (ENTRESTO) 97-103 MG Take 1 tablet by mouth 2 (two) times daily.   spironolactone (ALDACTONE) 25 MG tablet Take 25 mg by mouth daily.    Allergies: Patient has no known allergies.  Social History   Tobacco Use   Smoking status: Every Day    Packs/day: 0.50    Years: 52.00    Pack years: 26.00    Types: Cigarettes   Smokeless tobacco: Never  Vaping Use   Vaping Use: Never used  Substance Use Topics   Alcohol use: Yes    Alcohol/week: 4.0 - 5.0 standard drinks    Types: 1 - 2 Cans of beer, 3 Shots of liquor per week    Comment: occassional drink; past-1/2 to 1 pint liquor a week   Drug use: Not Currently    Types: Cocaine    Comment: 3 years    Family History  Problem Relation Age of Onset   Diabetes Mother    Diabetes Father     Review of Systems: A 12-system review of systems was performed and was negative except as noted in the HPI.  --------------------------------------------------------------------------------------------------  Physical Exam: BP 130/80 (BP Location: Left Arm, Patient Position: Sitting, Cuff Size: Large)    Pulse 81    Ht 5' 7.5" (1.715 m)    Wt 219 lb (99.3 kg)    SpO2 96%    BMI 33.79 kg/m   General:  NAD. Neck: No JVD or HJR. Lungs: Clear to auscultation bilaterally without wheezes or crackles. Heart: Regular rate and rhythm without murmurs, rubs, or gallops. Abdomen: Soft, nontender, nondistended. Extremities: No lower extremity edema.  EKG: Normal sinus rhythm with poor R wave progression.  Compared with prior tracing from 05/27/2021, PVC is no longer present.  Otherwise, no significant interval change.  Lab Results  Component Value Date   WBC 4.8 10/29/2020   HGB 13.8 10/29/2020   HCT 43.0 10/29/2020    MCV 95.3 10/29/2020   PLT 208 10/29/2020    Lab Results  Component Value Date   NA 141 10/29/2020   K 4.7 10/29/2020   CL 110 10/29/2020   CO2 24 10/29/2020   BUN 15 10/29/2020   CREATININE 1.16 10/29/2020   GLUCOSE 137 (H) 10/29/2020   ALT 13 10/29/2020    Lab Results  Component Value Date   CHOL 151 04/01/2016   HDL 46 04/01/2016   LDLCALC 99 04/01/2016   TRIG 363 (H) 11/27/2017   CHOLHDL 3.3 04/01/2016    --------------------------------------------------------------------------------------------------  ASSESSMENT AND PLAN: Chronic HFrEF due to nonischemic cardiomyopathy: Mr. Respass continues to feel well and appears euvolemic on exam.  He reports NYHA class I symptoms.  We will escalate carvedilol to 12.5 mg twice daily.  Continue current regimen of Entresto, dapagliflozin, and spironolactone, as well as as needed furosemide.  We will check CBC and  CMP today.  Attempted device interrogation was unsuccessful; I have advised Mr. Voller to reach out to the [facility] device clinic for remote transmission given his concern of mild shock near the ICD generator.  Description of the event is not consistent with defibrillation.  Most recent device interrogation available in care everywhere from 08/12/2021 made note of two brief episodes of NSVT.  Nonobstructive coronary artery disease: No angina reported.  Continue atorvastatin for secondary prevention.  Defer aspirin given long-term apixaban in the setting of PAF.  Paroxysmal atrial fibrillation: No symptoms reported.  EKG today shows sinus rhythm.  Continue apixaban 5 mg twice daily.  Check CBC and CMP today.  Hyperlipidemia associated with type 2 diabetes mellitus: Check CMP and lipid panel today.  Plans to continue atorvastatin 80 mg daily for target LDL less than 100, ideally below 70.  Ongoing management of DM per PCP.  Follow-up: Return to clinic in 6 months.  Nelva Bush, MD 09/02/2021 1:28 PM

## 2021-09-02 NOTE — Patient Instructions (Signed)
Medication Instructions:   Your physician has recommended you make the following change in your medication:   INCREASE Carvedilol 12.5 mg TWICE daily   *If you need a refill on your cardiac medications before your next appointment, please call your pharmacy*   Lab Work:  Today: CBC, CMET, Lipid panel  If you have labs (blood work) drawn today and your tests are completely normal, you will receive your results only by: MyChart Message (if you have MyChart) OR A paper copy in the mail If you have any lab test that is abnormal or we need to change your treatment, we will call you to review the results.   Testing/Procedures:  None ordered   Follow-Up: At [facility], you and your health needs are our priority.  As part of our continuing mission to provide you with exceptional heart care, we have created designated Provider Care Teams.  These Care Teams include your primary Cardiologist (physician) and Advanced Practice Providers (APPs -  Physician Assistants and Nurse Practitioners) who all work together to provide you with the care you need, when you need it.  We recommend signing up for the patient portal called "MyChart".  Sign up information is provided on this After Visit Summary.  MyChart is used to connect with patients for Virtual Visits (Telemedicine).  Patients are able to view lab/test results, encounter notes, upcoming appointments, etc.  Non-urgent messages can be sent to your provider as well.   To learn more about what you can do with MyChart, go to [email or web address].    Your next appointment:   6 month(s)  The format for your next appointment:   In Person  Provider:   You may see Yvonne Kendall, MD or one of the following Advanced Practice Providers on your designated Care Team:   Nicolasa Ducking, NP Eula Listen, PA-C Cadence Fransico Michael, [place]  Other Instructions  Please reach out to the [facility] EP clinic to discuss having your device interrogated.

## 2021-09-03 LAB — LIPID PANEL
Chol/HDL Ratio: 2.6 ratio (ref 0.0–5.0)
Cholesterol, Total: 88 mg/dL — ABNORMAL LOW (ref 100–199)
HDL: 34 mg/dL — ABNORMAL LOW (ref >39)
LDL Chol Calc (NIH): 33 mg/dL (ref 0–99)
Triglycerides: 111 mg/dL (ref 0–149)
VLDL Cholesterol Cal: 21 mg/dL (ref 5–40)

## 2021-09-03 LAB — COMPREHENSIVE METABOLIC PANEL
ALT: 21 IU/L (ref 0–44)
AST: 36 IU/L (ref 0–40)
Albumin/Globulin Ratio: 1.5 (ref 1.2–2.2)
Albumin: 4.2 g/dL (ref 3.8–4.8)
Alkaline Phosphatase: 112 IU/L (ref 44–121)
BUN/Creatinine Ratio: 16 (ref 10–24)
BUN: 22 mg/dL (ref 8–27)
Bilirubin Total: 0.2 mg/dL (ref 0.0–1.2)
CO2: 17 mmol/L — ABNORMAL LOW (ref 20–29)
Calcium: 8.7 mg/dL (ref 8.6–10.2)
Chloride: 106 mmol/L (ref 96–106)
Creatinine, Ser: 1.36 mg/dL — ABNORMAL HIGH (ref 0.76–1.27)
Globulin, Total: 2.8 g/dL (ref 1.5–4.5)
Glucose: 125 mg/dL — ABNORMAL HIGH (ref 70–99)
Sodium: 139 mmol/L (ref 134–144)
Total Protein: 7 g/dL (ref 6.0–8.5)
eGFR: 56 mL/min/{1.73_m2} — ABNORMAL LOW (ref >59)

## 2021-09-05 ENCOUNTER — Other Ambulatory Visit: Payer: Self-pay | Admitting: Family

## 2021-09-07 ENCOUNTER — Telehealth: Payer: Self-pay | Admitting: *Deleted

## 2021-09-07 DIAGNOSIS — I428 Other cardiomyopathies: Secondary | ICD-10-CM

## 2021-09-07 DIAGNOSIS — I10 Essential (primary) hypertension: Secondary | ICD-10-CM

## 2021-09-07 DIAGNOSIS — I2510 Atherosclerotic heart disease of native coronary artery without angina pectoris: Secondary | ICD-10-CM

## 2021-09-07 DIAGNOSIS — Z79899 Other long term (current) drug therapy: Secondary | ICD-10-CM

## 2021-09-07 DIAGNOSIS — I5022 Chronic systolic (congestive) heart failure: Secondary | ICD-10-CM

## 2021-09-07 NOTE — Telephone Encounter (Signed)
Also noted CBC has not returned.  Called [facility] and was told the CBC tube was also hemolyzed and unable to run test.  Will order repeat BMET and CBC.   Called and notified pt below.  Pt scheduled to come in our office tomorrow afternoon 1/18 to repeat BMET and CBC.

## 2021-09-07 NOTE — Telephone Encounter (Signed)
-----   Message from Yvonne Kendall, MD sent at 09/06/2021  5:23 PM EST ----- Hi Heather,  Thanks for catching that.  Yes, let's repeat a BMP at his convenience.  [name]  ----- Message ----- From: Jefferey Pica, RN Sent: 09/06/2021   3:56 PM EST To: Yvonne Kendall, MD, Annia Belt, RN  Hey Dr. Okey Dupre, was calling back [name]'s results- I just noticed [name]'s potassium level did not result due to hemolysis. Do you want him stuck again?

## 2021-09-08 ENCOUNTER — Other Ambulatory Visit: Payer: Self-pay

## 2021-09-08 ENCOUNTER — Other Ambulatory Visit (INDEPENDENT_AMBULATORY_CARE_PROVIDER_SITE_OTHER): Payer: Medicare Other

## 2021-09-08 DIAGNOSIS — I10 Essential (primary) hypertension: Secondary | ICD-10-CM | POA: Diagnosis not present

## 2021-09-08 DIAGNOSIS — I2510 Atherosclerotic heart disease of native coronary artery without angina pectoris: Secondary | ICD-10-CM

## 2021-09-08 DIAGNOSIS — I5022 Chronic systolic (congestive) heart failure: Secondary | ICD-10-CM

## 2021-09-08 DIAGNOSIS — I428 Other cardiomyopathies: Secondary | ICD-10-CM

## 2021-09-08 DIAGNOSIS — Z79899 Other long term (current) drug therapy: Secondary | ICD-10-CM

## 2021-09-09 ENCOUNTER — Other Ambulatory Visit
Admission: RE | Admit: 2021-09-09 | Discharge: 2021-09-09 | Disposition: A | Discharge status: Home / Self Care | Payer: Medicare Other | Source: Ambulatory Visit | Attending: Internal Medicine | Admitting: Internal Medicine

## 2021-09-09 ENCOUNTER — Telehealth: Payer: Self-pay | Admitting: Emergency Medicine

## 2021-09-09 ENCOUNTER — Telehealth: Payer: Self-pay | Admitting: Internal Medicine

## 2021-09-09 DIAGNOSIS — I5022 Chronic systolic (congestive) heart failure: Secondary | ICD-10-CM

## 2021-09-09 DIAGNOSIS — E875 Hyperkalemia: Secondary | ICD-10-CM

## 2021-09-09 LAB — BASIC METABOLIC PANEL
Anion gap: 4 — ABNORMAL LOW (ref 5–15)
BUN/Creatinine Ratio: 15 (ref 10–24)
BUN: 21 mg/dL (ref 8–27)
BUN: 26 mg/dL — ABNORMAL HIGH (ref 8–23)
CO2: 18 mmol/L — ABNORMAL LOW (ref 20–29)
CO2: 24 mmol/L (ref 22–32)
Calcium: 9.7 mg/dL (ref 8.6–10.2)
Calcium: 9.3 mg/dL (ref 8.9–10.3)
Chloride: 104 mmol/L (ref 96–106)
Chloride: 108 mmol/L (ref 98–111)
Creatinine, Ser: 1.44 mg/dL — ABNORMAL HIGH (ref 0.76–1.27)
Creatinine, Ser: 1.32 mg/dL — ABNORMAL HIGH (ref 0.61–1.24)
GFR, Estimated: 58 mL/min — ABNORMAL LOW (ref >60)
Glucose, Bld: 91 mg/dL (ref 70–99)
Glucose: 138 mg/dL — ABNORMAL HIGH (ref 70–99)
Potassium: 6.1 mmol/L — ABNORMAL HIGH (ref 3.5–5.2)
Potassium: 5.7 mmol/L — ABNORMAL HIGH (ref 3.5–5.1)
Sodium: 141 mmol/L (ref 134–144)
Sodium: 136 mmol/L (ref 135–145)
eGFR: 52 mL/min/{1.73_m2} — ABNORMAL LOW (ref >59)

## 2021-09-09 LAB — CBC
Hematocrit: 48 % (ref 37.5–51.0)
Hemoglobin: 15.4 g/dL (ref 13.0–17.7)
MCH: 31.2 pg (ref 26.6–33.0)
MCHC: 32.1 g/dL (ref 31.5–35.7)
MCV: 97 fL (ref 79–97)
Platelets: 234 10*3/uL (ref 150–450)
RBC: 4.93 x10E6/uL (ref 4.14–5.80)
RDW: 13.6 % (ref 11.6–15.4)
WBC: 3.9 10*3/uL (ref 3.4–10.8)

## 2021-09-09 NOTE — Telephone Encounter (Signed)
See additional 09/09/21 telephone encounter for additional documentation.

## 2021-09-09 NOTE — Telephone Encounter (Signed)
Patient called, results gone over.   Recommends from Dr. Okey Dupre gone over. Pt will:  - stop entresto - stop spironolactone  - [facility] once to have labs drawn - increase fluids   Pt understands that we will call him with the results of the re-draw.   Order placed to BMET to be drawn at [facility] as a stat lab.  Patient voiced appreciation for the call and understands that they can reach out to our office with any questions or concerns.

## 2021-09-09 NOTE — Telephone Encounter (Signed)
End, Harrell Gave, MD  P Cv Div [place] Triage Repeat potassium is still elevated at 5.7, but better than the 6.1 noted yesterday.  I recommend that Jorge Cruz continue to hold spironolactone and Entresto for now.  He should return for a BMP on Monday, at which time we can hopefully restart Entresto.

## 2021-09-09 NOTE — Telephone Encounter (Signed)
-----   Message from Yvonne Kendall, MD sent at 09/09/2021  6:48 AM EST ----- Please let Mr. Blough know that his creatinine has worsened slightly and that his potassium is significantly elevated.  He should stop taking spironolactone and Entresto immediately and come [facility] for a repeat BMP this morning.  He should also try to drink more fluid to stay well-hydrated.

## 2021-09-09 NOTE — Telephone Encounter (Signed)
Patient made aware of lab results and Dr. Serita Kyle recommendation. He will continue to hold entresto and spironolactone. Patient will have his bmp repeated on Mon 09/13/21 [facility]. Patient verbalized understanding to the instruction given and voiced appreciation for the call.

## 2021-09-13 ENCOUNTER — Other Ambulatory Visit
Admission: RE | Admit: 2021-09-13 | Discharge: 2021-09-13 | Disposition: A | Discharge status: Home / Self Care | Payer: Medicare Other | Attending: Internal Medicine | Admitting: Internal Medicine

## 2021-09-13 DIAGNOSIS — E875 Hyperkalemia: Secondary | ICD-10-CM | POA: Diagnosis present

## 2021-09-13 LAB — BASIC METABOLIC PANEL
Anion gap: 6 (ref 5–15)
BUN: 22 mg/dL (ref 8–23)
CO2: 26 mmol/L (ref 22–32)
Calcium: 9.8 mg/dL (ref 8.9–10.3)
Chloride: 108 mmol/L (ref 98–111)
Creatinine, Ser: 1.32 mg/dL — ABNORMAL HIGH (ref 0.61–1.24)
GFR, Estimated: 58 mL/min — ABNORMAL LOW (ref >60)
Glucose, Bld: 104 mg/dL — ABNORMAL HIGH (ref 70–99)
Potassium: 5.2 mmol/L — ABNORMAL HIGH (ref 3.5–5.1)
Sodium: 140 mmol/L (ref 135–145)

## 2021-09-15 ENCOUNTER — Telehealth: Payer: Self-pay | Admitting: *Deleted

## 2021-09-15 ENCOUNTER — Telehealth: Payer: Self-pay | Admitting: Internal Medicine

## 2021-09-15 DIAGNOSIS — E875 Hyperkalemia: Secondary | ICD-10-CM

## 2021-09-15 NOTE — Telephone Encounter (Signed)
See subsequent phone encounter 1/25 re lab results for further details.

## 2021-09-15 NOTE — Telephone Encounter (Signed)
This encounter was created in error - please disregard.

## 2021-09-15 NOTE — Telephone Encounter (Signed)
Called and spoke to pt.  Notified of lab results and Dr. Serita Kyle recc.  Pt will stop taking Spironolactone.  Pt will continue to [facility] for another week.  Pt is scheduled for repeat BMET 09/23/21 in our office.  Pt has no further questions at this time.

## 2021-09-15 NOTE — Telephone Encounter (Signed)
Please call with labwork results 

## 2021-09-15 NOTE — Telephone Encounter (Signed)
-----   Message from Yvonne Kendall, MD sent at 09/15/2021  6:45 AM EST ----- Potassium continues to improve but is still a little bit high.  I recommend that we discontinue spironolactone altogether and continue holding Entresto for another week.  We should repeat a BMP in about a week to see if his potassium has normalized.

## 2021-09-22 ENCOUNTER — Other Ambulatory Visit: Payer: Self-pay | Admitting: Medical

## 2021-09-23 ENCOUNTER — Other Ambulatory Visit: Payer: Self-pay

## 2021-09-23 ENCOUNTER — Other Ambulatory Visit (INDEPENDENT_AMBULATORY_CARE_PROVIDER_SITE_OTHER): Payer: Commercial Managed Care - HMO

## 2021-09-23 DIAGNOSIS — E875 Hyperkalemia: Secondary | ICD-10-CM

## 2021-09-24 LAB — BASIC METABOLIC PANEL
BUN/Creatinine Ratio: 16 (ref 10–24)
BUN: 18 mg/dL (ref 8–27)
CO2: 21 mmol/L (ref 20–29)
Calcium: 9.6 mg/dL (ref 8.6–10.2)
Chloride: 105 mmol/L (ref 96–106)
Creatinine, Ser: 1.13 mg/dL (ref 0.76–1.27)
Glucose: 144 mg/dL — ABNORMAL HIGH (ref 70–99)
Potassium: 4.4 mmol/L (ref 3.5–5.2)
Sodium: 143 mmol/L (ref 134–144)
eGFR: 70 mL/min/{1.73_m2} (ref >59)

## 2021-09-27 ENCOUNTER — Telehealth: Payer: Self-pay | Admitting: *Deleted

## 2021-09-27 DIAGNOSIS — I5022 Chronic systolic (congestive) heart failure: Secondary | ICD-10-CM

## 2021-09-27 DIAGNOSIS — Z79899 Other long term (current) drug therapy: Secondary | ICD-10-CM

## 2021-09-27 NOTE — Telephone Encounter (Signed)
-----   Message from Nelva Bush, MD sent at 09/25/2021  2:59 PM EST ----- Please let [name] know that his kidney function and potassium have normalized.  I recommend that we restart Entresto 97-103 mg twice daily and repeat a BMP in 2 weeks.  He should continue to stay off of spironolactone.

## 2021-09-27 NOTE — Telephone Encounter (Signed)
Spoke with pt. Notified of lab results and Dr. Serita Kyle recc below.  Pt voiced understanding.  Pt will RESTART Entresto 97-103 mg TWICE daily.  Pt will continue to stay OFF Spironolactone.  Pt scheduled for 2 week BMET in our office 10/11/21.   Pt has no further questions at this time.

## 2021-09-29 ENCOUNTER — Other Ambulatory Visit: Payer: Self-pay | Admitting: Family

## 2021-09-29 DIAGNOSIS — I5022 Chronic systolic (congestive) heart failure: Secondary | ICD-10-CM

## 2021-09-29 MED ORDER — DAPAGLIFLOZIN PROPANEDIOL 10 MG PO TABS: 10.0000 mg | ORAL_TABLET | Freq: Every day | ORAL | 3 refills | Status: DC

## 2021-09-29 MED ORDER — SACUBITRIL-VALSARTAN 97-103 MG PO TABS: 1.0000 | ORAL_TABLET | Freq: Two times a day (BID) | ORAL | 3 refills | Status: DC

## 2021-09-29 NOTE — Progress Notes (Signed)
Sent RX for [facility] farxiga to [employer] order

## 2021-10-11 ENCOUNTER — Other Ambulatory Visit: Payer: Self-pay

## 2021-10-11 ENCOUNTER — Other Ambulatory Visit (INDEPENDENT_AMBULATORY_CARE_PROVIDER_SITE_OTHER): Payer: Medicare Other

## 2021-10-11 DIAGNOSIS — I5022 Chronic systolic (congestive) heart failure: Secondary | ICD-10-CM

## 2021-10-11 DIAGNOSIS — Z79899 Other long term (current) drug therapy: Secondary | ICD-10-CM

## 2021-10-12 ENCOUNTER — Encounter: Payer: Self-pay | Admitting: Internal Medicine

## 2021-10-12 LAB — BASIC METABOLIC PANEL
BUN/Creatinine Ratio: 18 (ref 10–24)
BUN: 23 mg/dL (ref 8–27)
CO2: 23 mmol/L (ref 20–29)
Calcium: 9.7 mg/dL (ref 8.6–10.2)
Chloride: 103 mmol/L (ref 96–106)
Creatinine, Ser: 1.3 mg/dL — ABNORMAL HIGH (ref 0.76–1.27)
Glucose: 197 mg/dL — ABNORMAL HIGH (ref 70–99)
Potassium: 4.6 mmol/L (ref 3.5–5.2)
Sodium: 140 mmol/L (ref 134–144)
eGFR: 59 mL/min/{1.73_m2} — ABNORMAL LOW (ref >59)

## 2021-11-09 ENCOUNTER — Other Ambulatory Visit: Payer: Self-pay

## 2021-11-09 DIAGNOSIS — Z87891 Personal history of nicotine dependence: Secondary | ICD-10-CM

## 2021-11-09 DIAGNOSIS — F17210 Nicotine dependence, cigarettes, uncomplicated: Secondary | ICD-10-CM

## 2021-11-17 ENCOUNTER — Ambulatory Visit: Admission: RE | Admit: 2021-11-17 | Payer: Medicare Other | Source: Ambulatory Visit

## 2021-11-26 ENCOUNTER — Emergency Department
Admission: EM | Admit: 2021-11-26 | Discharge: 2021-11-26 | Disposition: A | Discharge status: Home / Self Care | Payer: Medicare Other | Attending: Emergency Medicine | Admitting: Emergency Medicine

## 2021-11-26 ENCOUNTER — Other Ambulatory Visit: Payer: Self-pay

## 2021-11-26 ENCOUNTER — Emergency Department: Admission: RE | Admit: 2021-11-26 | Payer: Medicare Other | Source: Ambulatory Visit

## 2021-11-26 ENCOUNTER — Emergency Department: Payer: Medicare Other

## 2021-11-26 DIAGNOSIS — J189 Pneumonia, unspecified organism: Secondary | ICD-10-CM

## 2021-11-26 DIAGNOSIS — R0600 Dyspnea, unspecified: Secondary | ICD-10-CM | POA: Diagnosis not present

## 2021-11-26 DIAGNOSIS — R059 Cough, unspecified: Secondary | ICD-10-CM | POA: Diagnosis not present

## 2021-11-26 DIAGNOSIS — R051 Acute cough: Secondary | ICD-10-CM

## 2021-11-26 DIAGNOSIS — N189 Chronic kidney disease, unspecified: Secondary | ICD-10-CM | POA: Insufficient documentation

## 2021-11-26 DIAGNOSIS — J449 Chronic obstructive pulmonary disease, unspecified: Secondary | ICD-10-CM | POA: Diagnosis not present

## 2021-11-26 DIAGNOSIS — R0981 Nasal congestion: Secondary | ICD-10-CM | POA: Insufficient documentation

## 2021-11-26 DIAGNOSIS — I509 Heart failure, unspecified: Secondary | ICD-10-CM | POA: Diagnosis not present

## 2021-11-26 DIAGNOSIS — R0602 Shortness of breath: Secondary | ICD-10-CM | POA: Insufficient documentation

## 2021-11-26 DIAGNOSIS — E119 Type 2 diabetes mellitus without complications: Secondary | ICD-10-CM | POA: Diagnosis not present

## 2021-11-26 DIAGNOSIS — Z20822 Contact with and (suspected) exposure to covid-19: Secondary | ICD-10-CM | POA: Insufficient documentation

## 2021-11-26 LAB — CBC
HCT: 44.2 % (ref 39.0–52.0)
Hemoglobin: 13.9 g/dL (ref 13.0–17.0)
MCH: 31 pg (ref 26.0–34.0)
MCHC: 31.4 g/dL (ref 30.0–36.0)
MCV: 98.4 fL (ref 80.0–100.0)
Platelets: 207 10*3/uL (ref 150–400)
RBC: 4.49 MIL/uL (ref 4.22–5.81)
RDW: 15.3 % (ref 11.5–15.5)
WBC: 4 10*3/uL (ref 4.0–10.5)
nRBC: 0 % (ref 0.0–0.2)

## 2021-11-26 LAB — COMPREHENSIVE METABOLIC PANEL
ALT: 19 U/L (ref 0–44)
AST: 26 U/L (ref 15–41)
Albumin: 4 g/dL (ref 3.5–5.0)
Alkaline Phosphatase: 74 U/L (ref 38–126)
Anion gap: 7 (ref 5–15)
BUN: 37 mg/dL — ABNORMAL HIGH (ref 8–23)
CO2: 27 mmol/L (ref 22–32)
Calcium: 9.2 mg/dL (ref 8.9–10.3)
Chloride: 107 mmol/L (ref 98–111)
Creatinine, Ser: 1.67 mg/dL — ABNORMAL HIGH (ref 0.61–1.24)
GFR, Estimated: 44 mL/min — ABNORMAL LOW (ref >60)
Glucose, Bld: 103 mg/dL — ABNORMAL HIGH (ref 70–99)
Potassium: 4.8 mmol/L (ref 3.5–5.1)
Sodium: 141 mmol/L (ref 135–145)
Total Bilirubin: 0.9 mg/dL (ref 0.3–1.2)
Total Protein: 7.5 g/dL (ref 6.5–8.1)

## 2021-11-26 LAB — RESP PANEL BY RT-PCR (FLU A&B, COVID) ARPGX2
Influenza A by PCR: NEGATIVE
Influenza B by PCR: NEGATIVE
SARS Coronavirus 2 by RT PCR: NEGATIVE

## 2021-11-26 LAB — TROPONIN I (HIGH SENSITIVITY): Troponin I (High Sensitivity): 45 ng/L — ABNORMAL HIGH (ref <18)

## 2021-11-26 LAB — BRAIN NATRIURETIC PEPTIDE: B Natriuretic Peptide: 81.3 pg/mL (ref 0.0–100.0)

## 2021-11-26 MED ORDER — CEPHALEXIN 500 MG PO CAPS
500.0000 mg | ORAL_CAPSULE | Freq: Two times a day (BID) | ORAL | 0 refills | Status: DC
Start: 2021-11-26 — End: 2022-01-10

## 2021-11-26 MED ORDER — AZITHROMYCIN 250 MG PO TABS: ORAL_TABLET | ORAL | 0 refills | Status: AC

## 2021-11-26 NOTE — Discharge Instructions (Signed)
Please fill and begin taking your antibiotics today as prescribed.  Please follow-up with your doctor on Tuesday for recheck/reevaluation.  Return to the emergency department for any worsening shortness of breath, significant cough/fever or any other symptom personally concerning to yourself. ?

## 2021-11-26 NOTE — ED Provider Notes (Signed)
? ?[facility] ?Provider Note ? ? ? Event Date/Time  ? First MD Initiated Contact with Patient 11/26/21 [phone]   ?  (approximate) ? ?History  ? ?Chief Complaint: Cough and Shortness of Breath ? ?HPI ? ?Jorge Cruz is a 71 y.o. male with a past medical history of CHF, CKD, COPD, diabetes, presents to the emergency department for shortness of breath and cough.  According to the patient he has had significant cough since this morning states mild shortness of breath.  Patient denies any lower extremity swelling.  Denies any fever.  States he has felt somewhat congested this morning as well. ? ?Physical Exam  ? ?Triage Vital Signs: ?ED Triage Vitals  ?Enc Vitals Group  ?   BP 11/26/21 0841 122/67  ?   Pulse Rate 11/26/21 0841 71  ?   Resp 11/26/21 0841 18  ?   Temp 11/26/21 0841 97.6 ?F (36.4 ?C)  ?   Temp src --   ?   SpO2 11/26/21 0841 93 %  ?   Weight 11/26/21 0843 (!) 486 lb 15.9 oz (220.9 kg)  ?   Height 11/26/21 0843 5' 7.5" (1.715 m)  ?   Head Circumference --   ?   Peak Flow --   ?   Pain Score --   ?   Pain Loc --   ?   Pain Edu? --   ?   Excl. in GC? --   ? ? ?Most recent vital signs: ?Vitals:  ? 11/26/21 0841  ?BP: 122/67  ?Pulse: 71  ?Resp: 18  ?Temp: 97.6 ?F (36.4 ?C)  ?SpO2: 93%  ? ? ?General: Awake, no distress.  ?CV:  Good peripheral perfusion.  Regular rate and rhythm  ?Resp:  Normal effort.  Equal breath sounds bilaterally.  ?Abd:  No distention.  Soft, nontender.  No rebound or guarding. ?Other:  No lower extremity edema ? ? ?ED Results / Procedures / Treatments  ? ?EKG ? ?EKG viewed and interpreted by myself shows a normal sinus rhythm at 68 bpm with a narrow QRS, normal axis, normal intervals, no concerning ST changes. ? ?RADIOLOGY ? ?I personally viewed the chest x-ray images, no significant abnormality noted on my evaluation.  Pacemaker present. ?Patient's chest x-ray read by radiology as tiny nodular opacities concerning for atypical infection. ? ? ?MEDICATIONS ORDERED IN  ED: ?Medications - No data to display ? ? ?IMPRESSION / MDM / ASSESSMENT AND PLAN / ED COURSE  ?I reviewed the triage vital signs and the nursing notes. ? ?Patient presents to the emergency department for shortness of breath and cough since this morning.  Patient denies any significant shortness of breath currently but continues to have cough.  Patient has a history of CHF.  Overall clear lung sounds on my examination.  Differential would include CHF exacerbation/pulmonary edema, infectious etiology such as pneumonia, COVID/influenza, less likely ACS.  We will check labs including cardiac enzymes and a BNP we will obtain chest x-ray, COVID swab, continue to closely monitor.  Patient agreeable. ? ?Chest x-ray shows small nodular opacities concerning for possible atypical infection.  We will obtain CT scan of chest to further evaluate. ? ?Patient's labs have resulted showing troponin of 45 however this is unchanged from historical values.  CBC is normal.  Chemistry is normal.  COVID and flu are negative.  BNP is normal as well.  Still pending CT imaging spoke to CT they are having a problem with her scanner but anticipate that  it should be working again within the next 30 to 45 minutes.  I spoke to the patient he does not want to wait for repeat labs or CT imaging as he states his wife needs to get home as she is supposed to be on oxygen.  Given the patient's reassuring lab work I believe it would be safe for him to go home he denies any chest pain and troponin is unchanged from historical values.  Given the x-ray findings we will cover with antibiotics and have the patient follow-up with his doctor.  Discussed return precautions.  Patient agreeable to plan. ? ?FINAL CLINICAL IMPRESSION(S) / ED DIAGNOSES  ? ?Dyspnea ?Cough ? ? ?Note:  This document was prepared using Dragon voice recognition software and may include unintentional dictation errors. ?  Minna Antis, MD ?11/26/21 1129 ? ?

## 2021-11-26 NOTE — ED Triage Notes (Addendum)
Pt states he woke with a lot of coughing today with SOB, states he felt fine yesterday. States he has a hx of CHF and concerned he is having a flare up. Denies any increased swelling. Pt is in NAD, respirations WNL, pt ambulatory with a steady gait on arrival. ?Pt has a noted congested cough with sinus congestion. ?

## 2021-12-28 NOTE — Progress Notes (Deleted)
Patient ID: Jorge Cruz, male    DOB: 1951/01/09, 71 y.o.   MRN: [id number]  HPI  Jorge Cruz is a 71 y/o male with a history of DM, COPD, HTN, current tobacco use and chronic heart failure.  Echo report from 08/20/21 reviewed and showed an EF of 30-35%. Echo report from 07/06/20 reviewed and showed an EF of 25%. Echo report from 10/11/19 reviewed and showed an EF of 30-35% along with trivial Jorge. Echo report from 11/25/17 reviewed and showed an EF of 20-25% along with a mildly elevated PA pressure of 35 mm Hg. Echo report from 04/01/16 was reviewed and shows an EF of 20-25% along with mild Jorge.   Cardiac catheterization was done 06/01/17 which showed an EF of 20% along with mild nonobstructive CAD. RHC also done which showed normal filling pressures, normal pulmonary pressure and moderately reduced cardiac output. Cardiac output was 3.82 with a cardiac index of 1.83.  Was in the ED 11/26/21 due to cough where he was evaluated and released.    He presents today for a follow-up visit with a chief complaint of   Past Medical History:  Diagnosis Date   Chronic combined systolic (congestive) and diastolic (congestive) heart failure ([place])    a. 03/2014 Echo: EF 45%, glob HK; b. 03/2016 Echo: EF 20-25%, diff HK, Gr1 DD, mild Jorge; b. 11/2017 Echo: EF 20%, diff HK, Gr2 DD, mildly to mod reduced RV fxn, PASP 50mmHg.   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 03/2014 Echo: EF 45%, glob HK, mild conc LVH; b. 03/2014 MV: possible mild ischemia, superimposed on small inf infarct, EF 36%; c. 03/2016 MV: EF <30%, no ischemia; d. 03/2016 Echo: EF 20-25%, diff HK, Gr1 DD, mild Jorge; e. 05/2017 Cath: nonobs dzs, EF 20%; f. 11/2017  Echo: EF 20%, diff HK, Gr2 DD.   Non-obstructive CAD (coronary artery disease)    a. 05/2017 Cath: LM nl, LAD 55m, LCX 50m, RCA 83m, EF 20%.   Paroxysmal atrial fibrillation (HCC)    Polysubstance abuse (Grandfather)    Tobacco abuse     Past Surgical History:  Procedure Laterality Date   RIGHT/LEFT HEART CATH AND CORONARY ANGIOGRAPHY N/A 06/01/2017   Procedure: RIGHT/LEFT HEART CATH AND CORONARY ANGIOGRAPHY;  Surgeon: Wellington Hampshire, MD;  Location: [facility];  Service: Cardiovascular;  Laterality: N/A;   XI ROBOTIC ASSISTED INGUINAL HERNIA REPAIR WITH MESH Right 03/03/2021   Procedure: XI ROBOTIC ASSISTED INGUINAL HERNIA REPAIR WITH MESH;  Surgeon: Herbert Pun, MD;  Location: [facility];  Service: General;  Laterality: Right;   Family History  Problem Relation Age of Onset   Diabetes Mother    Diabetes Father    Social History   Tobacco Use   Smoking status: Every Day    Packs/day: 0.50    Years: 52.00    Pack years: 26.00    Types: Cigarettes   Smokeless tobacco: Never  Substance Use Topics   Alcohol use: Yes    Alcohol/week: 4.0 - 5.0 standard drinks    Types: 1 - 2 Cans of beer, 3 Shots of liquor per week    Comment: occassional drink; past-1/2 to 1 pint liquor a week  Allergies  Allergen Reactions   Spironolactone Other (See Comments)    Hyperkalemia      Review of Systems  Constitutional:  Positive for fatigue (at times). Negative for appetite change.  HENT:  Negative for congestion, rhinorrhea and sore throat.   Eyes: Negative.   Respiratory:  Positive for cough (dry ), shortness of breath (infrequent) and wheezing. Negative for chest tightness.   Cardiovascular:  Negative for chest pain, palpitations and leg swelling.  Gastrointestinal:  Negative for abdominal distention and abdominal pain.  Endocrine: Negative.   Genitourinary: Negative.   Musculoskeletal:  Negative for arthralgias, back pain and neck pain.  Skin: Negative.    Allergic/Immunologic: Negative.   Neurological:  Negative for dizziness and light-headedness.  Hematological:  Negative for adenopathy. Does not bruise/bleed easily.  Psychiatric/Behavioral:  Negative for dysphoric mood and sleep disturbance (sleeping on 2 pillows). The patient is not nervous/anxious.      Physical Exam Vitals and nursing note reviewed.  Constitutional:      Appearance: He is well-developed.  HENT:     Head: Normocephalic and atraumatic.  Neck:     Vascular: No JVD.  Cardiovascular:     Rate and Rhythm: Normal rate and regular rhythm.  Pulmonary:     Effort: Pulmonary effort is normal.     Breath sounds: No wheezing or rales.  Abdominal:     General: There is no distension.     Palpations: Abdomen is soft.     Tenderness: There is no abdominal tenderness.  Musculoskeletal:        General: No tenderness.     Cervical back: Normal range of motion and neck supple.  Skin:    General: Skin is warm and dry.  Neurological:     Mental Status: He is alert and oriented to person, place, and time.  Psychiatric:        Behavior: Behavior normal.        Thought Content: Thought content normal.   Assessment & Plan:  1: Chronic heart failure with reduced ejection fraction- - NYHA class II - euvolemic today - weighing himself daily; reminded to call for an overnight weight gain of >2 pounds or a weekly weight gain of >5 pounds - weight 216 pounds since he was last here 6 months ago - not adding salt and has been reading food labels. Reviewed the importance of keeping sodium intake to 2000mg  daily.  - saw cardiology (End) 09/02/21 - on GDMT of carvedilol, farxiga, entresto and spironolactone - had ICD implantation 01/02/20; denies any firing of ICD - BNP 11/26/21 was 81.3   2: HTN- - BP - BMP from 11/26/21 reviewed and shows sodium 141, potassium 4.8, creatinine 1.67 and GFR 44 - sees PCP [facility])   3: DM- - says that glucose at home was  - A1c on  04/11/20 was 6.0%  4:Tobacco use-  - smoking 1/2-1 ppd of cigarettes - has ~ 4 ounces of crown royal at bedtime but not on a nightly basis - complete cessation discussed for 3 minutes with the patient   Medication bottles reviewed.

## 2021-12-29 ENCOUNTER — Ambulatory Visit: Payer: 59 | Admitting: Family

## 2021-12-30 ENCOUNTER — Ambulatory Visit: Payer: 59 | Admitting: Family

## 2022-01-09 NOTE — Progress Notes (Signed)
Patient ID: Jorge Cruz, male    DOB: [birth year], 71 y.o.   MRN: [id number]  HPI  Mr Runkel is a 71 y/o male with a history of DM, COPD, HTN, current tobacco use and chronic heart failure.  Echo report from 08/20/21 reviewed and showed an EF of 30-35%. Echo report from 07/06/20 reviewed and showed an EF of 25%. Echo report from 10/11/19 reviewed and showed an EF of 30-35% along with trivial MR. Echo report from 11/25/17 reviewed and showed an EF of 20-25% along with a mildly elevated PA pressure of 35 mm Hg. Echo report from 04/01/16 was reviewed and shows an EF of 20-25% along with mild MR.   Cardiac catheterization was done 06/01/17 which showed an EF of 20% along with mild nonobstructive CAD. RHC also done which showed normal filling pressures, normal pulmonary pressure and moderately reduced cardiac output. Cardiac output was 3.82 with a cardiac index of 1.83.  Was in the ED 11/26/21 due to cough where he was evaluated and released.    He presents today for a follow-up visit with a chief complaint of minimal fatigue upon moderate exertion. Describes this as chronic in nature having been present for several years. He has associated dry cough and occasional wheezing along with this. He denies any difficulty sleeping, dizziness, abdominal distention, palpitations, pedal edema, chest pain, shortness of breath or weight gain.   Did have some issues with heartburn but he took some TUMS and it improved. Taking his furosemide PRN an can't really remember the last time he took it.   Past Medical History:  Diagnosis Date   Chronic combined systolic (congestive) and diastolic (congestive) heart failure ([place])    a. 03/2014 Echo: EF 45%, glob HK; b. 03/2016 Echo: EF 20-25%, diff HK, Gr1 DD, mild MR; b. 11/2017 Echo: EF 20%, diff HK, Gr2 DD, mildly to mod reduced RV fxn, PASP 74mmHg.   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 03/2014 Echo: EF 45%, glob HK, mild conc LVH; b. 03/2014 MV: possible mild ischemia, superimposed on small inf infarct, EF 36%; c. 03/2016 MV: EF <30%, no ischemia; d. 03/2016 Echo: EF 20-25%, diff HK, Gr1 DD, mild MR; e. 05/2017 Cath: nonobs dzs, EF 20%; f. 11/2017 Echo: EF 20%, diff HK, Gr2 DD.   Non-obstructive CAD (coronary artery disease)    a. 05/2017 Cath: LM nl, LAD 77m, LCX 86m, RCA 69m, EF 20%.   Paroxysmal atrial fibrillation (HCC)    Polysubstance abuse ([place])    Tobacco abuse     Past Surgical History:  Procedure Laterality Date   RIGHT/LEFT HEART CATH AND CORONARY ANGIOGRAPHY N/A 06/01/2017   Procedure: RIGHT/LEFT HEART CATH AND CORONARY ANGIOGRAPHY;  Surgeon: Wellington Hampshire, MD;  Location: [facility];  Service: Cardiovascular;  Laterality: N/A;   XI ROBOTIC ASSISTED INGUINAL HERNIA REPAIR WITH MESH Right 03/03/2021   Procedure: XI ROBOTIC ASSISTED INGUINAL HERNIA REPAIR WITH MESH;  Surgeon: Herbert Pun, MD;  Location: [facility];  Service: General;  Laterality: Right;   Family History  Problem Relation Age of Onset   Diabetes Mother    Diabetes Father    Social History   Tobacco Use  Smoking status: Every Day    Packs/day: 0.50    Years: 52.00    Pack years: 26.00    Types: Cigarettes   Smokeless tobacco: Never  Substance Use Topics   Alcohol use: Yes    Alcohol/week: 4.0 - 5.0 standard drinks    Types: 1 - 2 Cans of beer, 3 Shots of liquor per week    Comment: occassional drink; past-1/2 to 1 pint liquor a week   Allergies  Allergen Reactions   Spironolactone Other (See Comments)    Hyperkalemia   Prior to Admission medications   Medication Sig Start Date End Date Taking? Authorizing Provider  allopurinol (ZYLOPRIM) 100 MG  tablet Take 1 tablet by mouth daily. 11/17/20  Yes [provider]  apixaban (ELIQUIS) 5 MG TABS tablet Take 5 mg by mouth 2 (two) times daily. 01/20/21  Yes [provider]  atorvastatin (LIPITOR) 80 MG tablet Take 80 mg by mouth daily.   Yes [provider]  carvedilol (COREG) 12.5 MG tablet Take 1 tablet (12.5 mg total) by mouth 2 (two) times daily. 09/02/21 03/01/22 Yes End, [name], MD  dapagliflozin propanediol (FARXIGA) 10 MG TABS tablet Take 1 tablet (10 mg total) by mouth daily before breakfast. 09/29/21  Yes Reyce Lubeck A, FNP  Fluticasone-Salmeterol (ADVAIR) 250-50 MCG/DOSE AEPB Inhale 1 puff into the lungs 2 (two) times daily. 03/17/20  Yes [provider]  magnesium oxide (MAG-OX) 400 MG tablet Take 1 tablet by mouth in the morning and at bedtime. 01/20/21 01/20/22 Yes [provider]  sacubitril-valsartan (ENTRESTO) 97-103 MG Take 1 tablet by mouth 2 (two) times daily. 09/29/21  Yes Donnamae Muilenburg, Inetta Fermo A, FNP  albuterol (PROVENTIL HFA;VENTOLIN HFA) 108 (90 Base) MCG/ACT inhaler Inhale 2 puffs into the lungs every 6 (six) hours as needed for wheezing or shortness of breath. Patient not taking: Reported on 01/10/2022    [provider]  furosemide (LASIX) 20 MG tablet Take 20 mg by mouth daily as needed.     [provider]  nitroGLYCERIN (NITROSTAT) 0.4 MG SL tablet Place 0.4 mg under the tongue every 5 (five) minutes as needed for chest pain. Patient not taking: Reported on 01/10/2022    [provider]    Review of Systems  Constitutional:  Positive for fatigue (at times). Negative for appetite change.  HENT:  Negative for congestion, rhinorrhea and sore throat.   Eyes: Negative.   Respiratory:  Positive for cough (dry ) and wheezing. Negative for chest tightness and shortness of breath.   Cardiovascular:  Negative for chest pain, palpitations and leg swelling.  Gastrointestinal:  Negative for abdominal distention and  abdominal pain.  Endocrine: Negative.   Genitourinary: Negative.   Musculoskeletal:  Negative for arthralgias, back pain and neck pain.  Skin: Negative.   Allergic/Immunologic: Negative.   Neurological:  Negative for dizziness and light-headedness.  Hematological:  Negative for adenopathy. Does not bruise/bleed easily.  Psychiatric/Behavioral:  Negative for dysphoric mood and sleep disturbance (sleeping on 2 pillows). The patient is not nervous/anxious.    Vitals:   01/10/22 0903  BP: 110/73  Pulse: 68  SpO2: 100%  Weight: 220 lb 8 oz (100 kg)  Height: 5\' 7"  (1.702 m)   Wt Readings from Last 3 Encounters:  01/10/22 220 lb 8 oz (100 kg)  11/26/21 (!) 486 lb 15.9 oz (220.9 kg)  09/02/21 219 lb (99.3 kg)   Lab Results  Component Value Date   CREATININE 1.67 (H) 11/26/2021   CREATININE 1.30 (H)  10/11/2021   CREATININE 1.13 09/23/2021   Physical Exam Vitals and nursing note reviewed.  Constitutional:      Appearance: He is well-developed.  HENT:     Head: Normocephalic and atraumatic.  Neck:     Vascular: No JVD.  Cardiovascular:     Rate and Rhythm: Normal rate and regular rhythm.  Pulmonary:     Effort: Pulmonary effort is normal.     Breath sounds: No wheezing or rales.  Abdominal:     General: There is no distension.     Palpations: Abdomen is soft.     Tenderness: There is no abdominal tenderness.  Musculoskeletal:        General: No tenderness.     Cervical back: Normal range of motion and neck supple.  Skin:    General: Skin is warm and dry.  Neurological:     Mental Status: He is alert and oriented to person, place, and time.  Psychiatric:        Behavior: Behavior normal.        Thought Content: Thought content normal.   Assessment & Plan:  1: Chronic heart failure with reduced ejection fraction- - NYHA class II - euvolemic today - weighing himself daily; reminded to call for an overnight weight gain of >2 pounds or a weekly weight gain of >5  pounds - weight up 4 pounds since he was last here 6 months ago - not adding salt and has been reading food labels. Reviewed the importance of keeping sodium intake to 2000mg  daily.  - saw cardiology (End) 09/02/21 - on GDMT of carvedilol, farxiga and entresto  - previously had been on spironolactone but he developed hyperkalemia - taking furosemide PRN but he can't remember the last time he took it - had ICD implantation 01/02/20; denies any firing of ICD - BNP 11/26/21 was 81.3  2: HTN- - BP looks good (110/73) - BMP from 11/26/21 reviewed and shows sodium 141, potassium 4.8, creatinine 1.67 and GFR 44 - sees PCP [facility])   3: DM- - says that glucose at home was 120 this morning - A1c on 04/11/20 was 6.0%  4:Tobacco use-  - smoking 1/2-1 ppd of cigarettes - has ~ 4 ounces of crown royal at bedtime but not on a nightly basis - complete cessation discussed for 3 minutes with the patient   Medication bottles reviewed.   Return in 6 months, sooner if needed

## 2022-01-10 ENCOUNTER — Ambulatory Visit: Payer: Medicare Other | Attending: Family | Admitting: Family

## 2022-01-10 ENCOUNTER — Encounter: Payer: Self-pay | Admitting: Family

## 2022-01-10 ENCOUNTER — Other Ambulatory Visit: Payer: Self-pay

## 2022-01-10 VITALS — BP 110/73 | HR 68 | Ht 67.0 in | Wt 220.5 lb

## 2022-01-10 DIAGNOSIS — F17210 Nicotine dependence, cigarettes, uncomplicated: Secondary | ICD-10-CM | POA: Diagnosis not present

## 2022-01-10 DIAGNOSIS — I130 Hypertensive heart and chronic kidney disease with heart failure and stage 1 through stage 4 chronic kidney disease, or unspecified chronic kidney disease: Secondary | ICD-10-CM | POA: Diagnosis not present

## 2022-01-10 DIAGNOSIS — E1122 Type 2 diabetes mellitus with diabetic chronic kidney disease: Secondary | ICD-10-CM | POA: Diagnosis not present

## 2022-01-10 DIAGNOSIS — Z79899 Other long term (current) drug therapy: Secondary | ICD-10-CM | POA: Insufficient documentation

## 2022-01-10 DIAGNOSIS — Z7984 Long term (current) use of oral hypoglycemic drugs: Secondary | ICD-10-CM | POA: Diagnosis not present

## 2022-01-10 DIAGNOSIS — N183 Chronic kidney disease, stage 3 unspecified: Secondary | ICD-10-CM | POA: Diagnosis not present

## 2022-01-10 DIAGNOSIS — I5022 Chronic systolic (congestive) heart failure: Secondary | ICD-10-CM | POA: Diagnosis present

## 2022-01-10 DIAGNOSIS — I10 Essential (primary) hypertension: Secondary | ICD-10-CM

## 2022-01-10 DIAGNOSIS — Z7901 Long term (current) use of anticoagulants: Secondary | ICD-10-CM | POA: Insufficient documentation

## 2022-01-10 DIAGNOSIS — Z95810 Presence of automatic (implantable) cardiac defibrillator: Secondary | ICD-10-CM | POA: Diagnosis not present

## 2022-01-10 DIAGNOSIS — Z720 Tobacco use: Secondary | ICD-10-CM | POA: Diagnosis not present

## 2022-01-10 DIAGNOSIS — J449 Chronic obstructive pulmonary disease, unspecified: Secondary | ICD-10-CM | POA: Insufficient documentation

## 2022-01-10 DIAGNOSIS — Z7951 Long term (current) use of inhaled steroids: Secondary | ICD-10-CM | POA: Diagnosis not present

## 2022-01-10 DIAGNOSIS — N1832 Chronic kidney disease, stage 3b: Secondary | ICD-10-CM

## 2022-01-10 MED ORDER — NITROGLYCERIN 0.4 MG SL SUBL: 0.4000 mg | SUBLINGUAL_TABLET | SUBLINGUAL | 2 refills | Status: DC | PRN

## 2022-01-10 NOTE — Patient Instructions (Signed)
Continue weighing daily and call for an overnight weight gain of 3 pounds or more or a weekly weight gain of more than 5 pounds.   If you have voicemail, please make sure your mailbox is cleaned out so that we may leave a message and please make sure to listen to any voicemails.     

## 2022-02-07 ENCOUNTER — Other Ambulatory Visit: Payer: Self-pay | Admitting: Internal Medicine

## 2022-03-01 ENCOUNTER — Encounter: Payer: Self-pay | Admitting: *Deleted

## 2022-03-06 ENCOUNTER — Other Ambulatory Visit: Payer: Self-pay | Admitting: Internal Medicine

## 2022-03-07 NOTE — Progress Notes (Unsigned)
Cardiology Office Note    Date:  03/08/2022   ID:  Jorge Cruz, DOB Jan 04, 1951, MRN [id number]  PCP:  [facility]  Cardiologist:  Yvonne Kendall, MD  Electrophysiologist:  None   Chief Complaint: Follow-up  History of Present Illness:   Jorge Cruz is a 71 y.o. male with history of HFrEF status post ICD in 12/2019 monitored through [facility], nonobstructive CAD, PAF, DM2, CKD stage II-III, HTN, COPD, and polysubstance use who presents for follow-up of HFrEF, CAD, and A-fib.  Remote treadmill MPI in 03/2016 showed no evidence of perfusion defects with an EF of less than 30% and was overall an intermediate risk study.  Blood pressure demonstrated a hypertensive response to exercise.  Echo at that time demonstrated an EF of 20 to 25%, grade 1 diastolic dysfunction, mild concentric LVH, and mild mitral regurgitation.  Subsequent R/LHC in 05/2017 showed mild nonobstructive CAD as outlined below with an EF of 20%.  RHC showed normal filling pressures, normal pulmonary pressure, and moderately reduced cardiac output.  Over the years, he has had a persistent cardiomyopathy with most recent echo from 07/2021 demonstrating an EF of 30 to 35%, global hypokinesis, mildly dilated LV internal cavity size, grade 1 diastolic dysfunction, mildly reduced RV systolic function with mildly enlarged ventricular cavity size, no significant valvular abnormalities, and an estimated right atrial pressure of 3 mmHg.  He has intermittently been followed by both [facility]ology, [facility] cardiology, and our office.  He was last seen in our office in 08/2021 and was without symptoms of angina or decompensation.  He did report one episode of a brief shocking sensation near his ICD.  He did not report feeling a shock in his chest or passing out.  Attempted device interrogation was unsuccessful, and he was advised to reach out to [facility] device clinic for a remote transmission.  The description of the event was not  consistent with defibrillation.  Carvedilol was titrated to 12.5 mg twice daily, otherwise he was continued on Entresto, Farxiga, and spironolactone as well as as needed furosemide.  However, follow-up labs showed hyperkalemia with recommendation to hold Entresto and spironolactone.  He was subsequently restarted on Entresto, following improvement in potassium, with recommendation to remain off spironolactone.  He comes in doing well from a cardiac perspective and is without symptoms of angina or decompensation.  No dizziness, presyncope, or syncope.  No dyspnea, palpitations, lower extremity swelling, orthopnea.  No device discharges, shocks, or alarms.  He does note mild chest discomfort when eating spicy foods that resolves with Tums.  Overall, he feels like he is doing very well and does not have any active cardiac issues or concerns at this time.     Labs independently reviewed: 11/2021 - potassium 4.8, BUN 37, serum creatinine 1.67, albumin 4.0, AST/ALT normal, Hgb 13.9, PLT 207 08/2021 - TC 88, TG 111, HDL 34, LDL 33 01/2021 - magnesium 1.4 06/2020 - A1c 6.0 11/2017 - TSH normal  Past Medical History:  Diagnosis Date   Chronic combined systolic (congestive) and diastolic (congestive) heart failure (HCC)    a. 03/2014 Echo: EF 45%, glob HK; b. 03/2016 Echo: EF 20-25%, diff HK, Gr1 DD, mild MR; b. 11/2017 Echo: EF 20%, diff HK, Gr2 DD, mildly to mod reduced RV fxn, PASP .   CKD (chronic kidney disease), stage III (HCC)    CKD (chronic kidney disease), stage III (HCC)    COPD (chronic obstructive pulmonary disease) (HCC)    Diabetes mellitus without complication (  [place])    Hypertensive heart disease    ICD (implantable cardioverter-defibrillator) in place 01/02/2020   NICM (nonischemic cardiomyopathy) ([place])    a. 03/2014 Echo: EF 45%, glob HK, mild conc LVH; b. 03/2014 MV: possible mild ischemia, superimposed on small inf infarct, EF 36%; c. 03/2016 MV: EF <30%, no ischemia; d. 03/2016 Echo: EF  20-25%, diff HK, Gr1 DD, mild MR; e. 05/2017 Cath: nonobs dzs, EF 20%; f. 11/2017 Echo: EF 20%, diff HK, Gr2 DD.   Non-obstructive CAD (coronary artery disease)    a. 05/2017 Cath: LM nl, LAD 38m, LCX 51m, RCA 15m, EF 20%.   Paroxysmal atrial fibrillation (HCC)    Polysubstance abuse ([place])    Tobacco abuse     Past Surgical History:  Procedure Laterality Date   RIGHT/LEFT HEART CATH AND CORONARY ANGIOGRAPHY N/A 06/01/2017   Procedure: RIGHT/LEFT HEART CATH AND CORONARY ANGIOGRAPHY;  Surgeon: Wellington Hampshire, MD;  Location: [facility];  Service: Cardiovascular;  Laterality: N/A;   XI ROBOTIC ASSISTED INGUINAL HERNIA REPAIR WITH MESH Right 03/03/2021   Procedure: XI ROBOTIC ASSISTED INGUINAL HERNIA REPAIR WITH MESH;  Surgeon: Herbert Pun, MD;  Location: [facility];  Service: General;  Laterality: Right;    Current Medications: Current Meds  Medication Sig   albuterol (PROVENTIL HFA;VENTOLIN HFA) 108 (90 Base) MCG/ACT inhaler Inhale 2 puffs into the lungs every 6 (six) hours as needed for wheezing or shortness of breath.   allopurinol (ZYLOPRIM) 100 MG tablet Take 1 tablet by mouth daily.   apixaban (ELIQUIS) 5 MG TABS tablet Take 5 mg by mouth 2 (two) times daily.   atorvastatin (LIPITOR) 80 MG tablet Take 80 mg by mouth daily.   carvedilol (COREG) 12.5 MG tablet Take 1 tablet (12.5 mg total) by mouth 2 (two) times daily.   dapagliflozin propanediol (FARXIGA) 10 MG TABS tablet Take 1 tablet (10 mg total) by mouth daily before breakfast.   Fluticasone-Salmeterol (ADVAIR) 250-50 MCG/DOSE AEPB Inhale 1 puff into the lungs 2 (two) times daily.   furosemide (LASIX) 20 MG tablet Take 20 mg by mouth daily as needed.   nitroGLYCERIN (NITROSTAT) 0.4 MG SL tablet DISSOLVE 1 TABLET UNDER THE  TONGUE EVERY 5 MINUTES AS NEEDED FOR CHEST PAIN. MAX OF 3 TABLETS IN 15 MINUTES. CALL 911 IF PAIN  PERSISTS.   sacubitril-valsartan (ENTRESTO) 97-103 MG Take 1 tablet by mouth 2 (two) times  daily.    Allergies:   Spironolactone   Social History   Socioeconomic History   Marital status: Married    Spouse name: Not on file   Number of children: Not on file   Years of education: Not on file   Highest education level: Not on file  Occupational History   Not on file  Tobacco Use   Smoking status: Every Day    Packs/day: 0.50    Years: 52.00    Total pack years: 26.00    Types: Cigarettes   Smokeless tobacco: Never  Vaping Use   Vaping Use: Never used  Substance and Sexual Activity   Alcohol use: Yes    Alcohol/week: 4.0 - 5.0 standard drinks of alcohol    Types: 1 - 2 Cans of beer, 3 Shots of liquor per week    Comment: occassional drink; past-1/2 to 1 pint liquor a week   Drug use: Not Currently    Types: Cocaine    Comment: 3 years   Sexual activity: Not Currently  Other Topics Concern   Not on  file  Social History Narrative   Not on file   Social Determinants of Health   Financial Resource Strain: Not on file  Food Insecurity: Not on file  Transportation Needs: Not on file  Physical Activity: Not on file  Stress: Not on file  Social Connections: Not on file     Family History:  The patient's family history includes Diabetes in his father and mother.  ROS:   12-point review of systems is negative unless otherwise noted in the HPI.   EKGs/Labs/Other Studies Reviewed:    Studies reviewed were summarized above. The additional studies were reviewed today:  2D echo 08/20/2021: 1. Left ventricular ejection fraction, by estimation, is 30 to 35%. The  left ventricle has moderate to severely decreased function. The left  ventricle demonstrates global hypokinesis. The left ventricular internal  cavity size was mildly dilated. Left  ventricular diastolic parameters are consistent with Grade I diastolic  dysfunction (impaired relaxation).   2. Right ventricular systolic function is mildly reduced. The right  ventricular size is mildly enlarged.   3.  The mitral valve is normal in structure. No evidence of mitral valve  regurgitation.   4. The aortic valve is tricuspid. Aortic valve regurgitation is not  visualized.   5. The inferior vena cava is normal in size with greater than 50%  respiratory variability, suggesting right atrial pressure of 3 mmHg. __________  2D echo 10/11/2019: 1. Left ventricular ejection fraction, by estimation, is 30 to 35%. The  left ventricle has moderately decreased function. The left ventricle  demonstrates global hypokinesis. The left ventricular internal cavity size  was moderately dilated. Left  ventricular diastolic parameters were normal.   2. Right ventricular systolic function is low normal. The right  ventricular size is mildly enlarged. There is normal pulmonary artery  systolic pressure.   3. The mitral valve is normal in structure and function. Trivial mitral  valve regurgitation.   4. The aortic valve is normal in structure and function. Aortic valve  regurgitation is trivial. __________  2D echo 11/25/2017: - Left ventricle: The cavity size was mildly dilated. There was    mild concentric hypertrophy. Systolic function was severely    reduced. The estimated ejection fraction was less than 20%.    Diffuse hypokinesis. Regional wall motion abnormalities cannot be    excluded. Features are consistent with a pseudonormal left    ventricular filling pattern, with concomitant abnormal relaxation    and increased filling pressure (grade 2 diastolic dysfunction).  - Left atrium: The atrium was normal in size.  - Right ventricle: Systolic function was mildly to moderately    reduced.  - Pulmonary arteries: Systolic pressure was mildly elevated PA peak    pressure: 35 mm Hg (S). __________  [facility] 06/01/2017: There is severe left ventricular systolic dysfunction. LV end diastolic pressure is normal. Mid RCA lesion, 30 %stenosed. Mid Cx lesion, 30 %stenosed. Mid LAD lesion, 30 %stenosed.   1.  Mild nonobstructive coronary artery disease. 2. Severely reduced LV systolic function with an ejection fraction of 20%. 3. Right heart catheterization showed normal filling pressures, normal pulmonary pressure and moderately reduced cardiac output. Cardiac output was 3.82 with cardiac index of 1.83. Pulmonary capillary wedge pressure was 5 mmHg.   Recommendations: Continue medical therapy for nonischemic cardiomyopathy. The patient does not appear to be volume overloaded. Resume small dose furosemide tomorrow. We can consider adding digoxin and spironolactone in the future but that has to be done in the outpatient setting  once we make sure good compliance. __________  2D echo 04/01/2016: - Left ventricle: The cavity size was moderately dilated. There was    mild concentric hypertrophy. Systolic function was severely    reduced. The estimated ejection fraction was in the range of 20%    to 25%. Diffuse hypokinesis. Doppler parameters are consistent    with abnormal left ventricular relaxation (grade 1 diastolic    dysfunction).  - Mitral valve: There was mild regurgitation. __________  Treadmill MPI 04/01/2016: Blood pressure demonstrated a hypertensive response to exercise. There was no ST segment deviation noted during stress. No T wave inversion was noted during stress. This is an intermediate risk study. The left ventricular ejection fraction is severely decreased (<30%) with severely dilated left ventricule No evidence of perfusion defects. Possible non-ischemic cardiomyopathy.    EKG:  EKG is ordered today.  The EKG ordered today demonstrates NSR, 64 bpm, no acute ST-T changes, no significant change when compared to prior tracing  Recent Labs: 11/26/2021: ALT 19; B Natriuretic Peptide 81.3; BUN 37; Creatinine, Ser 1.67; Hemoglobin 13.9; Platelets 207; Potassium 4.8; Sodium 141  Recent Lipid Panel    Component Value Date/Time   CHOL 88 (L) 09/02/2021 1403   TRIG 111 09/02/2021  1403   HDL 34 (L) 09/02/2021 1403   CHOLHDL 2.6 09/02/2021 1403   CHOLHDL 3.3 04/01/2016 1240   VLDL 6 04/01/2016 1240   LDLCALC 33 09/02/2021 1403    PHYSICAL EXAM:    VS:  BP 118/60 (BP Location: Left Arm, Patient Position: Sitting, Cuff Size: Normal)   Pulse 64   Ht 5\' 7"  (1.702 m)   Wt 221 lb 6 oz (100.4 kg)   SpO2 93%   BMI 34.67 kg/m   BMI: Body mass index is 34.67 kg/m.  Physical Exam Vitals reviewed.  Constitutional:      Appearance: He is well-developed.  HENT:     Head: Normocephalic and atraumatic.  Eyes:     General:        Right eye: No discharge.        Left eye: No discharge.  Neck:     Vascular: No JVD.  Cardiovascular:     Rate and Rhythm: Normal rate and regular rhythm.     Pulses:          Posterior tibial pulses are 2+ on the right side and 2+ on the left side.     Heart sounds: Normal heart sounds, S1 normal and S2 normal. Heart sounds not distant. No midsystolic click and no opening snap. No murmur heard.    No friction rub.  Pulmonary:     Effort: Pulmonary effort is normal. No respiratory distress.     Breath sounds: Normal breath sounds. No decreased breath sounds, wheezing or rales.  Chest:     Chest wall: No tenderness.  Abdominal:     General: There is no distension.  Musculoskeletal:     Cervical back: Normal range of motion.     Right lower leg: No edema.     Left lower leg: No edema.  Skin:    General: Skin is warm and dry.     Nails: There is no clubbing.  Neurological:     Mental Status: He is alert and oriented to person, place, and time.  Psychiatric:        Speech: Speech normal.        Behavior: Behavior normal.        Thought Content: Thought content normal.  Judgment: Judgment normal.     Wt Readings from Last 3 Encounters:  03/08/22 221 lb 6 oz (100.4 kg)  01/10/22 220 lb 8 oz (100 kg)  11/26/21 (!) 486 lb 15.9 oz (220.9 kg)     ASSESSMENT & PLAN:   HFrEF secondary to NICM status post ICD: He appears  euvolemic and well compensated with NYHA class I symptoms.  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  Continue current GDMT including carvedilol, [place], Entresto, and as needed furosemide.  No longer on spironolactone given history of hyperkalemia associated with this medication.  We will update a BMP today.  He will continue to follow-up with [facility] for ongoing management of his ICD.  Nonobstructive CAD: No symptoms concerning for angina.  Continue secondary prevention and current medical therapy including apixaban in place of aspirin to minimize bleeding risk and atorvastatin.  PAF: Maintaining sinus rhythm.  He remains on carvedilol.  CHA2DS2-VASc at least 5 (CHF, HTN, age x1, DM, vascular disease).  Continue apixaban 5 mg twice daily (he does not meet reduced dosing criteria).  Trend renal function and electrolytes.  Recent CBC stable.  HLD: LDL 33 and 08/2021 with normal LFT in 11/2021.  He remains on atorvastatin 80 mg.  CKD stage II-III with history of hyperkalemia: Trend BMP.  No plans to rechallenge with spironolactone given underlying renal dysfunction and history of hyperkalemia.   Disposition: F/u with Dr. Saunders Revel or an APP in 6 months.   Medication Adjustments/Labs and Tests Ordered: Current medicines are reviewed at length with the patient today.  Concerns regarding medicines are outlined above. Medication changes, Labs and Tests ordered today are summarized above and listed in the Patient Instructions accessible in Encounters.   Signed, Christell Faith, PA-C 03/08/2022 11:18 AM     [address], [place] [phone]

## 2022-03-08 ENCOUNTER — Other Ambulatory Visit
Admission: RE | Admit: 2022-03-08 | Discharge: 2022-03-08 | Disposition: A | Discharge status: Home / Self Care | Payer: Medicare Other | Attending: Physician Assistant | Admitting: Physician Assistant

## 2022-03-08 ENCOUNTER — Ambulatory Visit (INDEPENDENT_AMBULATORY_CARE_PROVIDER_SITE_OTHER): Payer: Medicare Other | Admitting: Physician Assistant

## 2022-03-08 ENCOUNTER — Encounter: Payer: Self-pay | Admitting: Physician Assistant

## 2022-03-08 VITALS — BP 118/60 | HR 64 | Ht 67.0 in | Wt 221.4 lb

## 2022-03-08 DIAGNOSIS — I428 Other cardiomyopathies: Secondary | ICD-10-CM | POA: Diagnosis present

## 2022-03-08 DIAGNOSIS — N182 Chronic kidney disease, stage 2 (mild): Secondary | ICD-10-CM

## 2022-03-08 DIAGNOSIS — I2510 Atherosclerotic heart disease of native coronary artery without angina pectoris: Secondary | ICD-10-CM | POA: Insufficient documentation

## 2022-03-08 DIAGNOSIS — I5022 Chronic systolic (congestive) heart failure: Secondary | ICD-10-CM

## 2022-03-08 DIAGNOSIS — Z8639 Personal history of other endocrine, nutritional and metabolic disease: Secondary | ICD-10-CM | POA: Diagnosis present

## 2022-03-08 DIAGNOSIS — E785 Hyperlipidemia, unspecified: Secondary | ICD-10-CM

## 2022-03-08 DIAGNOSIS — I480 Paroxysmal atrial fibrillation: Secondary | ICD-10-CM

## 2022-03-08 LAB — BASIC METABOLIC PANEL
Anion gap: 4 — ABNORMAL LOW (ref 5–15)
BUN: 37 mg/dL — ABNORMAL HIGH (ref 8–23)
CO2: 27 mmol/L (ref 22–32)
Calcium: 9.4 mg/dL (ref 8.9–10.3)
Chloride: 109 mmol/L (ref 98–111)
Creatinine, Ser: 1.49 mg/dL — ABNORMAL HIGH (ref 0.61–1.24)
GFR, Estimated: 50 mL/min — ABNORMAL LOW (ref >60)
Glucose, Bld: 106 mg/dL — ABNORMAL HIGH (ref 70–99)
Potassium: 4.7 mmol/L (ref 3.5–5.1)
Sodium: 140 mmol/L (ref 135–145)

## 2022-03-08 NOTE — Patient Instructions (Signed)
Medication Instructions:  No changes at this time.   *If you need a refill on your cardiac medications before your next appointment, please call your pharmacy*   Lab Work: BMET today over at the [employer] at [facility] [place] go to 1st desk on the right to check in (REGISTRATION)  If you have labs (blood work) drawn today and your tests are completely normal, you will receive your results only by: MyChart Message (if you have MyChart) OR A paper copy in the mail If you have any lab test that is abnormal or we need to change your treatment, we will call you to review the results.   Testing/Procedures: None   Follow-Up: At [facility], you and your health needs are our priority.  As part of our continuing mission to provide you with exceptional heart care, we have created designated Provider Care Teams.  These Care Teams include your primary Cardiologist (physician) and Advanced Practice Providers (APPs -  Physician Assistants and Nurse Practitioners) who all work together to provide you with the care you need, when you need it.  We recommend signing up for the patient portal called "MyChart".  Sign up information is provided on this After Visit Summary.  MyChart is used to connect with patients for Virtual Visits (Telemedicine).  Patients are able to view lab/test results, encounter notes, upcoming appointments, etc.  Non-urgent messages can be sent to your provider as well.   To learn more about what you can do with MyChart, go to [email or web address].    Your next appointment:   6 month(s)  The format for your next appointment:   In Person  Provider:   Yvonne Kendall, MD or Eula Listen, PA-C      Important Information About Sugar

## 2022-05-28 ENCOUNTER — Other Ambulatory Visit: Payer: Self-pay | Admitting: Physician Assistant

## 2022-07-10 ENCOUNTER — Other Ambulatory Visit: Payer: Self-pay | Admitting: Internal Medicine

## 2022-07-14 IMAGING — CR DG CHEST 2V
2 series · 2 of 2 positions shown · non-contrast
Comparison: Prior chest radiographs 10/29/2020 and earlier.

CLINICAL DATA: Provided history: Shortness of breath/cough.
Additional history provided: Patient reports a history of CHF, CKD,
COPD, diabetes, smoking.

EXAM:
CHEST - 2 VIEW

[chest pa]
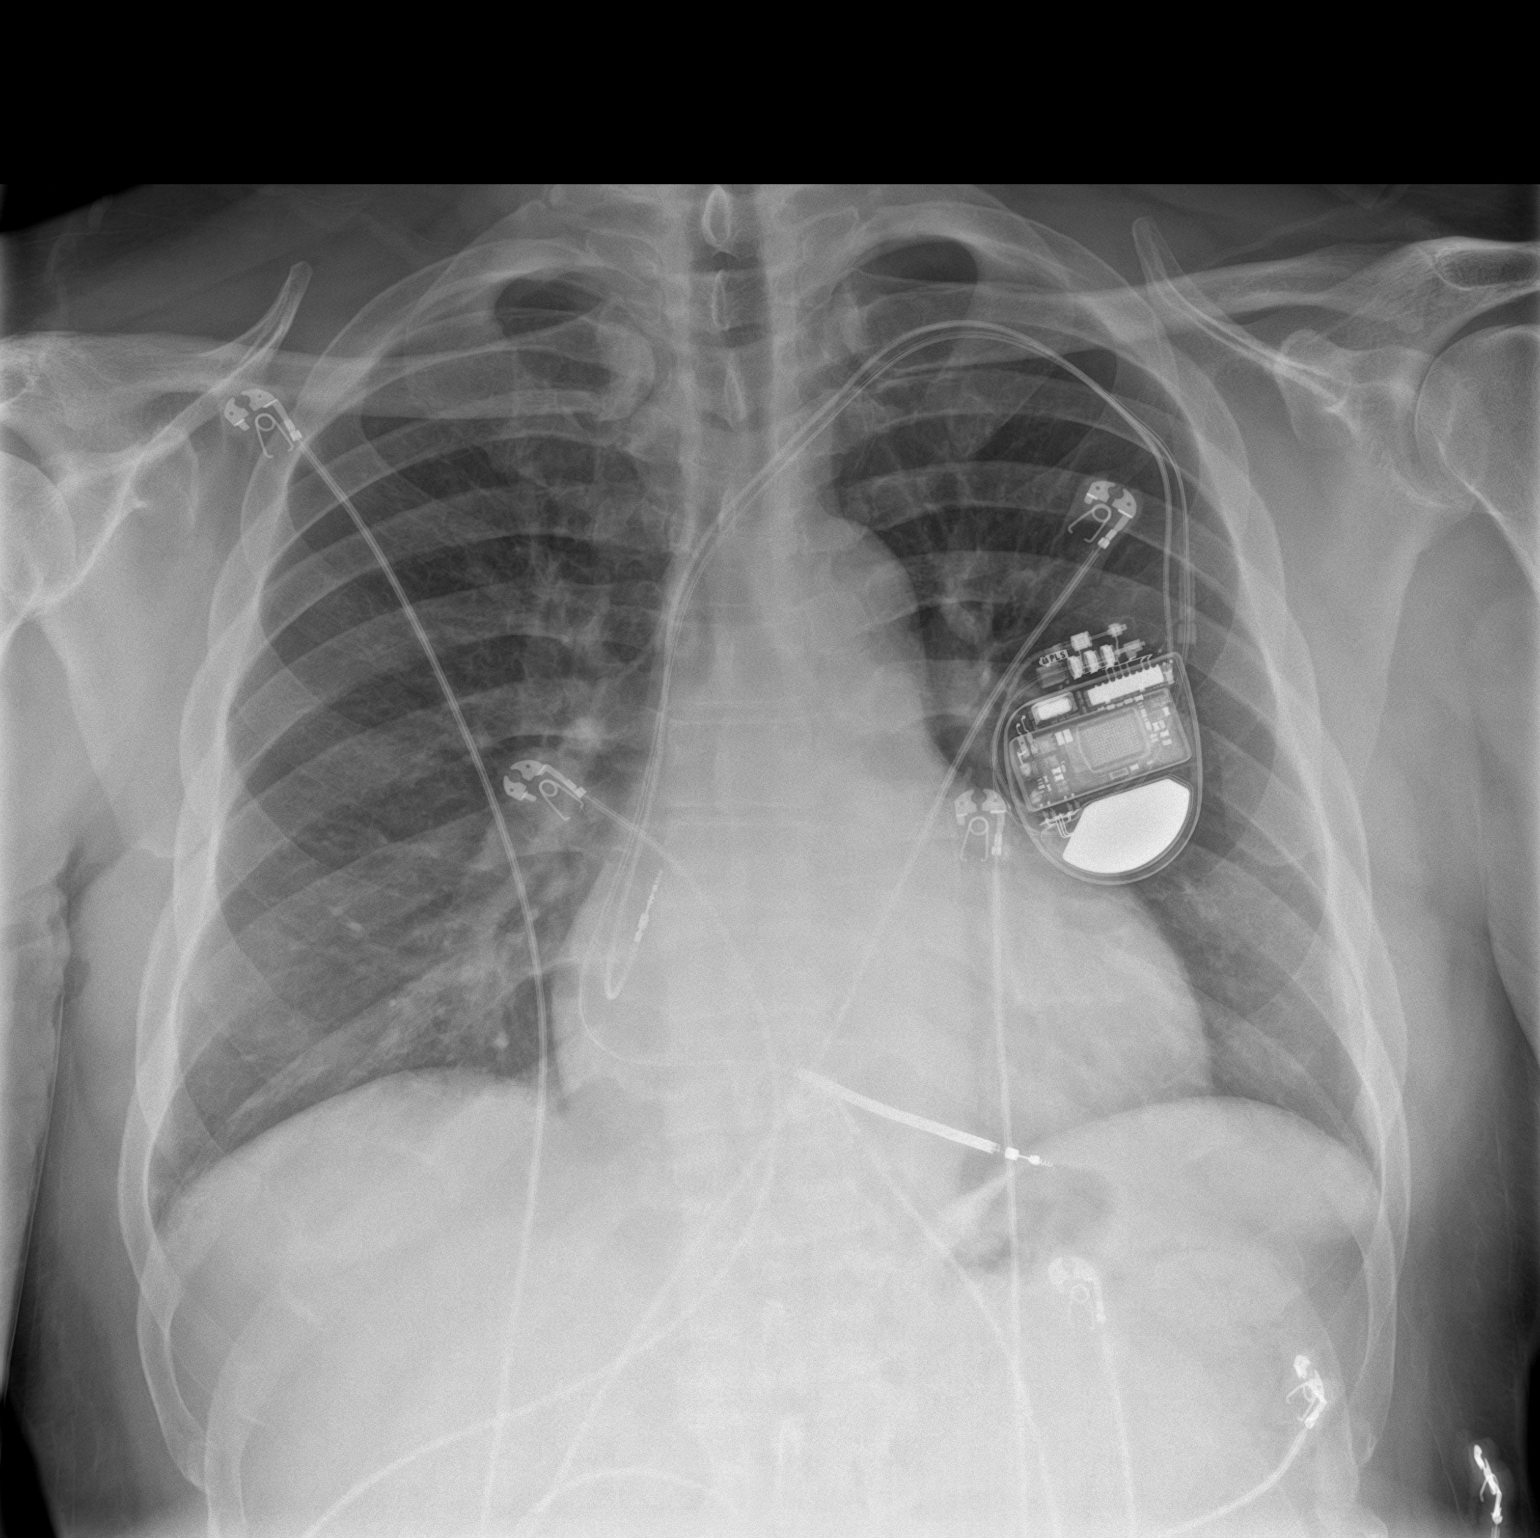

[chest lat]
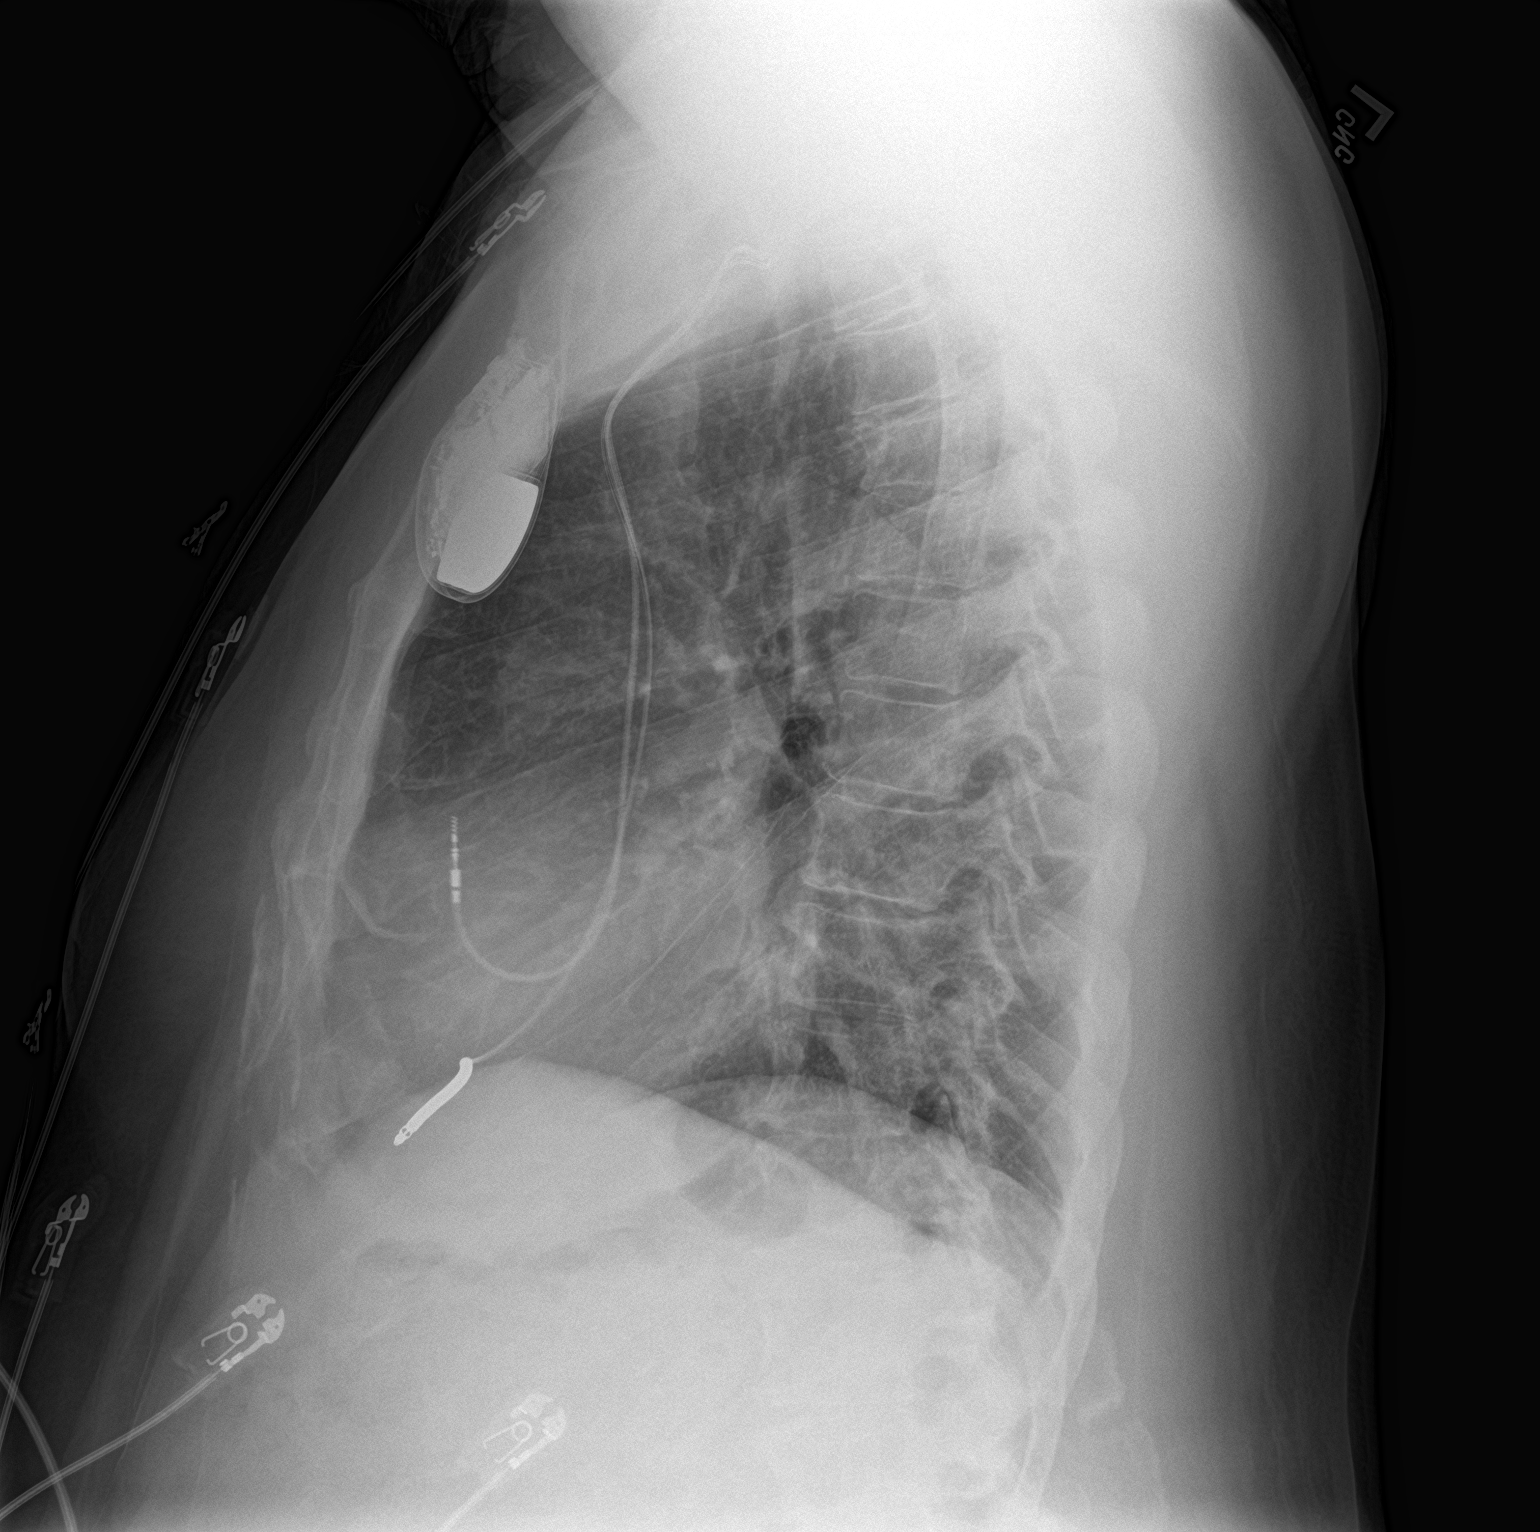

[2 of 2 positions shown; findings below may reference images not displayed]

FINDINGS: Redemonstrated left chest multi-lead implantable cardiac device.
Mild cardiomegaly, unchanged. Apparent tiny nodular opacities within
the right lung base, new from the prior examination of 10/29/2020.
No appreciable airspace consolidation within the left lung. No
evidence of pleural effusion or pneumothorax. No acute bony
abnormality identified.
IMPRESSION: Apparent tiny nodular opacities within the right lung base, new from
the prior examination of 10/29/2020. Although nonspecific, these
findings may reflect atypical infection given the provided history
of shortness of breath and cough. Consider a chest CT for further
evaluation.

Cardiomegaly, unchanged.

## 2022-07-18 NOTE — Progress Notes (Unsigned)
Patient ID: Jorge Cruz, male    DOB: 07/18/1951, 71 y.o.   MRN: [id number]  HPI  Jorge Cruz is a 71 y/o male with a history of DM, COPD, HTN, current tobacco use and chronic heart failure.  Echo report from 08/20/21 reviewed and showed an EF of 30-35%. Echo report from 07/06/20 reviewed and showed an EF of 25%. Echo report from 10/11/19 reviewed and showed an EF of 30-35% along with trivial Jorge. Echo report from 11/25/17 reviewed and showed an EF of 20-25% along with a mildly elevated PA pressure of 35 mm Hg. Echo report from 04/01/16 was reviewed and shows an EF of 20-25% along with mild Jorge.   Cardiac catheterization was done 06/01/17 which showed an EF of 20% along with mild nonobstructive CAD. RHC also done which showed normal filling pressures, normal pulmonary pressure and moderately reduced cardiac output. Cardiac output was 3.82 with a cardiac index of 1.83.  Has not been admitted or been in the ED in the last 6 months.   He presents today for a follow-up visit with a chief complaint of minimal fatigue upon moderate exertion. Describes this as chronic in nature. Has associated dry cough, occasional wheeze and left hip pain along with this. Denies any difficulty sleeping, dizziness, abdominal distention, palpitations, pedal edema, chest pain or shortness of breath.   Hasn't been weighing daily but usually every other day.   He didn't bring his medications with him and is unsure if he's taking carvedilol or not. Thinks he is but is not 100% sure.   Past Medical History:  Diagnosis Date   Chronic combined systolic (congestive) and diastolic (congestive) heart failure (HCC)    a. 03/2014 Echo: EF 45%, glob HK; b. 03/2016 Echo: EF 20-25%, diff HK, Gr1 DD, mild Jorge; b. 11/2017 Echo: EF 20%, diff HK, Gr2 DD, mildly to mod reduced RV fxn, PASP .   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 03/2014 Echo: EF 45%, glob HK, mild conc LVH; b. 03/2014 MV: possible mild ischemia, superimposed on small inf infarct, EF 36%; c. 03/2016 MV: EF <30%, no ischemia; d. 03/2016 Echo: EF 20-25%, diff HK, Gr1 DD, mild Jorge; e. 05/2017 Cath: nonobs dzs, EF 20%; f. 11/2017 Echo: EF 20%, diff HK, Gr2 DD.   Non-obstructive CAD (coronary artery disease)    a. 05/2017 Cath: LM nl, LAD 44m, LCX 48m, RCA 14m, EF 20%.   Paroxysmal atrial fibrillation (HCC)    Polysubstance abuse (HCC)    Tobacco abuse     Past Surgical History:  Procedure Laterality Date   RIGHT/LEFT HEART CATH AND CORONARY ANGIOGRAPHY N/A 06/01/2017   Procedure: RIGHT/LEFT HEART CATH AND CORONARY ANGIOGRAPHY;  Surgeon: Iran Ouch, MD;  Location: [facility] INVASIVE CV LAB;  Service: Cardiovascular;  Laterality: N/A;   XI ROBOTIC ASSISTED INGUINAL HERNIA REPAIR WITH MESH Right 03/03/2021   Procedure: XI ROBOTIC ASSISTED INGUINAL HERNIA REPAIR WITH MESH;  Surgeon: Carolan Shiver, MD;  Location: [facility] ORS;  Service: General;  Laterality: Right;   Family History  Problem Relation Age of Onset   Diabetes Mother    Diabetes Father    Social History   Tobacco Use   Smoking  status: Every Day    Packs/day: 0.50    Years: 52.00    Total pack years: 26.00    Types: Cigarettes   Smokeless tobacco: Never  Substance Use Topics   Alcohol use: Yes    Alcohol/week: 4.0 - 5.0 standard drinks of alcohol    Types: 1 - 2 Cans of beer, 3 Shots of liquor per week    Comment: occassional drink; past-1/2 to 1 pint liquor a week   Allergies  Allergen Reactions   Spironolactone Other (See Comments)    Hyperkalemia   Prior to Admission medications   Medication Sig Start Date End Date Taking? Authorizing Provider  albuterol (PROVENTIL HFA;VENTOLIN HFA)  108 (90 Base) MCG/ACT inhaler Inhale 2 puffs into the lungs every 6 (six) hours as needed for wheezing or shortness of breath.   Yes [provider]  allopurinol (ZYLOPRIM) 100 MG tablet Take 1 tablet by mouth daily. 11/17/20  Yes [provider]  apixaban (ELIQUIS) 5 MG TABS tablet Take 5 mg by mouth 2 (two) times daily. 01/20/21  Yes [provider]  atorvastatin (LIPITOR) 80 MG tablet Take 80 mg by mouth daily.   Yes [provider]  dapagliflozin propanediol (FARXIGA) 10 MG TABS tablet Take 1 tablet (10 mg total) by mouth daily before breakfast. 09/29/21  Yes Lamya Lausch A, FNP  Fluticasone-Salmeterol (ADVAIR) 250-50 MCG/DOSE AEPB Inhale 1 puff into the lungs 2 (two) times daily. 03/17/20  Yes [provider]  furosemide (LASIX) 20 MG tablet Take 20 mg by mouth daily as needed.   Yes [provider]  magnesium oxide (MAG-OX) 400 MG tablet Take 1 tablet by mouth 2 (two) times daily. 02/18/22  Yes [provider]  sacubitril-valsartan (ENTRESTO) 97-103 MG Take 1 tablet by mouth 2 (two) times daily. 09/29/21  Yes Clarisa Kindred A, FNP  carvedilol (COREG) 12.5 MG tablet Take 1 tablet (12.5 mg total) by mouth 2 (two) times daily. 07/19/22   Delma Freeze, FNP  nitroGLYCERIN (NITROSTAT) 0.4 MG SL tablet DISSOLVE 1 TABLET UNDER THE  TONGUE EVERY 5 MINUTES AS NEEDED FOR CHEST PAIN. MAX OF 3 TABLETS IN 15 MINUTES. CALL 911 IF PAIN  PERSISTS. Patient not taking: Reported on 07/19/2022 05/30/22   Sondra Barges, PA-C    Review of Systems  Constitutional:  Positive for fatigue (at times). Negative for appetite change.  HENT:  Negative for congestion, rhinorrhea and sore throat.   Eyes: Negative.   Respiratory:  Positive for cough (dry ) and wheezing. Negative for chest tightness and shortness of breath.   Cardiovascular:  Negative for chest pain, palpitations and leg swelling.  Gastrointestinal:  Negative for abdominal distention and abdominal pain.   Endocrine: Negative.   Genitourinary: Negative.   Musculoskeletal:  Positive for arthralgias (left hip). Negative for back pain and neck pain.  Skin: Negative.   Allergic/Immunologic: Negative.   Neurological:  Negative for dizziness and light-headedness.  Hematological:  Negative for adenopathy. Does not bruise/bleed easily.  Psychiatric/Behavioral:  Negative for dysphoric mood and sleep disturbance (sleeping on 2 pillows). The patient is not nervous/anxious.    Vitals:   07/19/22 0903  BP: 123/80  Pulse: 80  SpO2: 100%  Weight: 222 lb (100.7 kg)   Wt Readings from Last 3 Encounters:  07/19/22 222 lb (100.7 kg)  03/08/22 221 lb 6 oz (100.4 kg)  01/10/22 220 lb 8 oz (100 kg)   Lab Results  Component Value Date   CREATININE 1.49 (H) 03/08/2022  CREATININE 1.67 (H) 11/26/2021   CREATININE 1.30 (H) 10/11/2021   Physical Exam Vitals and nursing note reviewed.  Constitutional:      Appearance: He is well-developed.  HENT:     Head: Normocephalic and atraumatic.  Neck:     Vascular: No JVD.  Cardiovascular:     Rate and Rhythm: Normal rate and regular rhythm.  Pulmonary:     Effort: Pulmonary effort is normal.     Breath sounds: No wheezing or rales.  Abdominal:     General: There is no distension.     Palpations: Abdomen is soft.     Tenderness: There is no abdominal tenderness.  Musculoskeletal:        General: No tenderness.     Cervical back: Normal range of motion and neck supple.  Skin:    General: Skin is warm and dry.  Neurological:     Mental Status: He is alert and oriented to person, place, and time.  Psychiatric:        Behavior: Behavior normal.        Thought Content: Thought content normal.    Assessment & Plan:  1: Chronic heart failure with reduced ejection fraction- - NYHA class II - euvolemic today - weighing himself every other day; encouraged to resume daily weighing so that he can call for an overnight weight gain of >2 pounds or a  weekly weight gain of >5 pounds - weight up 2 pounds since he was last here 6 months ago - not adding salt and has been reading food labels. Reviewed the importance of keeping sodium intake to 2000mg  daily.  - saw cardiology ([facility]) 03/08/22 - on GDMT of carvedilol, farxiga and entresto although he's not entirely sure about carvedilol - sent Rx for carvedilol to [facility] - previously had been on spironolactone but he developed hyperkalemia; could consider rechallenge - was going to check labs today but he says that he had them done last week at PCP office - taking furosemide PRN but he can't remember the last time he took it - had ICD implantation 01/02/20; denies any firing of ICD - BNP 11/26/21 was 81.3 - PharmD reconciled medications with the patient - has received flu / pneumonia vaccine for this season  2: HTN- - BP looks good (123/80) - BMP from 03/08/22 reviewed and shows sodium 140, potassium 4.7, creatinine 1.49 and GFR 50 - [facility] 07/12/22 and had labs drawn; will request those results  3: DM- - A1c on 04/11/20 was 6.0% - no longer taking metformin  4:Tobacco use-  - smoking 1/2-1 ppd of cigarettes - has ~ 4 ounces of [place] at bedtime but not on a nightly basis - complete cessation discussed for 3 minutes with the patient   Patient did not bring his medications nor a list. Each medication was verbally reviewed with the patient and he was encouraged to bring the bottles to every visit to confirm accuracy of list.  Return in 6 months, sooner if needed.

## 2022-07-19 ENCOUNTER — Ambulatory Visit: Payer: Medicare Other | Attending: Family | Admitting: Family

## 2022-07-19 ENCOUNTER — Encounter: Payer: Self-pay | Admitting: Pharmacist

## 2022-07-19 ENCOUNTER — Encounter: Payer: Self-pay | Admitting: Family

## 2022-07-19 VITALS — BP 123/80 | HR 80 | Wt 222.0 lb

## 2022-07-19 DIAGNOSIS — Z720 Tobacco use: Secondary | ICD-10-CM | POA: Diagnosis not present

## 2022-07-19 DIAGNOSIS — I10 Essential (primary) hypertension: Secondary | ICD-10-CM

## 2022-07-19 DIAGNOSIS — F17210 Nicotine dependence, cigarettes, uncomplicated: Secondary | ICD-10-CM | POA: Diagnosis not present

## 2022-07-19 DIAGNOSIS — I2510 Atherosclerotic heart disease of native coronary artery without angina pectoris: Secondary | ICD-10-CM | POA: Diagnosis not present

## 2022-07-19 DIAGNOSIS — I130 Hypertensive heart and chronic kidney disease with heart failure and stage 1 through stage 4 chronic kidney disease, or unspecified chronic kidney disease: Secondary | ICD-10-CM | POA: Diagnosis present

## 2022-07-19 DIAGNOSIS — J449 Chronic obstructive pulmonary disease, unspecified: Secondary | ICD-10-CM | POA: Diagnosis not present

## 2022-07-19 DIAGNOSIS — E1122 Type 2 diabetes mellitus with diabetic chronic kidney disease: Secondary | ICD-10-CM | POA: Insufficient documentation

## 2022-07-19 DIAGNOSIS — N1832 Chronic kidney disease, stage 3b: Secondary | ICD-10-CM

## 2022-07-19 DIAGNOSIS — I5022 Chronic systolic (congestive) heart failure: Secondary | ICD-10-CM

## 2022-07-19 MED ORDER — CARVEDILOL 12.5 MG PO TABS: 12.5000 mg | ORAL_TABLET | Freq: Two times a day (BID) | ORAL | 3 refills | Status: DC

## 2022-07-19 NOTE — Patient Instructions (Addendum)
Continue weighing daily and call for an overnight weight gain of 3 pounds or more or a weekly weight gain of more than 5 pounds.  If you have voicemail, please make sure your mailbox is cleaned out so that we may leave a message and please make sure to listen to any voicemails.   If you receive a satisfaction survey regarding the Heart Failure Clinic, please take the time to fill it out. This way we can continue to provide excellent care and make any changes that need to be made.   Resume your carvedilol twice daily once you get it from your mail order pharmacy

## 2022-07-19 NOTE — Progress Notes (Signed)
Patient ID: Jorge Cruz, male   DOB: [birth year], 71 y.o.   MRN: [id number] [facility] - HEART FAILURE CLINIC - PHARMACIST COUNSELING NOTE  Guideline-Directed Medical Therapy/Evidence Based Medicine  ACE/ARB/ARNI: {AR_ARNI/ACEi/ARB:25599} Beta Blocker: {AR_Beta blocker:25598} Aldosterone Antagonist: {AR_Mineralocorticoid receptor antagonists:25602} Diuretic: {AR_Diuretics:25604} SGLT2i: {AR_SGLT2i:25603}  Adherence Assessment  Do you ever forget to take your medication? [] Yes [] No  Do you ever skip doses due to side effects? [] Yes [] No  Do you have trouble affording your medicines? [] Yes [] No  Are you ever unable to pick up your medication due to transportation difficulties? [] Yes [] No  Do you ever stop taking your medications because you don't believe they are helping? [] Yes [] No  Do you check your weight daily? [] Yes [] No   Adherence strategy: ***  Barriers to obtaining medications: ***  Vital signs: HR ***, BP ***, weight (pounds) *** ECHO: Date ***, EF ***, notes *** Cath: Date ***, EF ***, notes ***     Latest Ref Rng & Units 03/08/2022   11:18 AM 11/26/2021    9:57 AM 10/11/2021   11:15 AM  BMP  Glucose 70 - 99 mg/dL     BUN 8 - 23 mg/dL 37  37  23   Creatinine 0.61 - 1.24 mg/dL     BUN/Creat Ratio 10 - 24   18   Sodium 135 - 145 mmol/L 140  141  140   Potassium 3.5 - 5.1 mmol/L 4.7  4.8  4.6   Chloride 98 - 111 mmol/L 109  107  103   CO2 22 - 32 mmol/L 27  27  23    Calcium 8.9 - 10.3 mg/dL 9.4  9.2  9.7     Past Medical History:  Diagnosis Date   Chronic combined systolic (congestive) and diastolic (congestive) heart failure (HCC)    a. 03/2014 Echo: EF 45%, glob HK; b. 03/2016 Echo: EF 20-25%, diff HK, Gr1 DD, mild MR; b. 11/2017 Echo: EF 20%, diff HK, Gr2 DD, mildly to mod reduced RV fxn, PASP 03/10/2022.   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 03/2014 Echo: EF 45%, glob HK, mild conc LVH; b. 03/2014 MV: possible mild ischemia, superimposed on small inf infarct, EF 36%; c. 03/2016 MV: EF <30%, no ischemia; d. 03/2016 Echo: EF 20-25%, diff HK, Gr1 DD, mild MR; e. 05/2017 Cath: nonobs dzs, EF 20%; f. 11/2017 Echo: EF 20%, diff HK, Gr2 DD.   Non-obstructive CAD (coronary artery disease)    a. 05/2017 Cath: LM nl, LAD 31m, LCX 38m, RCA 12m, EF 20%.   Paroxysmal atrial fibrillation (HCC)    Polysubstance abuse (HCC)    Tobacco abuse     ASSESSMENT *** year old {Gender Description:[id number]} who presents to the HF clinic ***  Recent ED Visit (past 6 months): Date - ***, CC - ***  PLAN     Time spent: *** minutes  Marios Gaiser Rodriguez-Guzman PharmD, BCPS 07/19/2022 10:06 AM   Current Outpatient Medications:    albuterol (PROVENTIL HFA;VENTOLIN HFA) 108 (90 Base) MCG/ACT inhaler, Inhale 2 puffs into the lungs every 6 (six) hours as needed for wheezing or shortness of breath., Disp: , Rfl:  allopurinol (ZYLOPRIM) 100 MG tablet, Take 1 tablet by mouth daily., Disp: , Rfl:    apixaban (ELIQUIS) 5 MG TABS tablet, Take 5 mg by mouth 2 (two) times daily., Disp: , Rfl:    atorvastatin (LIPITOR) 80 MG tablet, Take 80 mg by mouth daily., Disp: , Rfl:    carvedilol (COREG) 12.5 MG tablet, Take 1 tablet (12.5 mg total) by mouth 2 (two) times daily., Disp: 180 tablet, Rfl: 3   dapagliflozin propanediol (FARXIGA) 10 MG TABS tablet, Take 1 tablet (10 mg total) by mouth daily before breakfast., Disp: 90 tablet, Rfl: 3   Fluticasone-Salmeterol (ADVAIR) 250-50 MCG/DOSE AEPB, Inhale 1 puff into the lungs 2 (two) times daily., Disp: , Rfl:    furosemide (LASIX) 20 MG tablet, Take 20 mg by mouth daily as needed., Disp: , Rfl:     magnesium oxide (MAG-OX) 400 MG tablet, Take 1 tablet by mouth 2 (two) times daily., Disp: , Rfl:    nitroGLYCERIN (NITROSTAT) 0.4 MG SL tablet, DISSOLVE 1 TABLET UNDER THE  TONGUE EVERY 5 MINUTES AS NEEDED FOR CHEST PAIN. MAX OF 3 TABLETS IN 15 MINUTES. CALL 911 IF PAIN  PERSISTS. (Patient not taking: Reported on 07/19/2022), Disp: 100 tablet, Rfl: 3   sacubitril-valsartan (ENTRESTO) 97-103 MG, Take 1 tablet by mouth 2 (two) times daily., Disp: 180 tablet, Rfl: 3  MEDICATION ADHERENCES TIPS AND STRATEGIES Taking medication as prescribed improves patient outcomes in heart failure (reduces hospitalizations, improves symptoms, increases survival) Side effects of medications can be managed by decreasing doses, switching agents, stopping drugs, or adding additional therapy. Please let someone in the Heart Failure Clinic know if you have having bothersome side effects so we can modify your regimen. Do not alter your medication regimen without talking to [place].  Medication reminders can help patients remember to take drugs on time. If you are missing or forgetting doses you can try linking behaviors, using pill boxes, or an electronic reminder like an alarm on your phone or an app. Some people can also get automated phone calls as medication reminders.

## 2022-07-23 ENCOUNTER — Emergency Department: Payer: Medicare Other

## 2022-07-23 ENCOUNTER — Emergency Department
Admission: EM | Admit: 2022-07-23 | Discharge: 2022-07-23 | Disposition: A | Discharge status: Home / Self Care | Payer: Medicare Other | Attending: Emergency Medicine | Admitting: Emergency Medicine

## 2022-07-23 DIAGNOSIS — N476 Balanoposthitis: Secondary | ICD-10-CM | POA: Diagnosis not present

## 2022-07-23 DIAGNOSIS — Z1152 Encounter for screening for COVID-19: Secondary | ICD-10-CM | POA: Diagnosis not present

## 2022-07-23 DIAGNOSIS — I2510 Atherosclerotic heart disease of native coronary artery without angina pectoris: Secondary | ICD-10-CM | POA: Insufficient documentation

## 2022-07-23 DIAGNOSIS — I110 Hypertensive heart disease with heart failure: Secondary | ICD-10-CM | POA: Diagnosis not present

## 2022-07-23 DIAGNOSIS — J40 Bronchitis, not specified as acute or chronic: Secondary | ICD-10-CM | POA: Diagnosis not present

## 2022-07-23 DIAGNOSIS — R059 Cough, unspecified: Secondary | ICD-10-CM | POA: Diagnosis present

## 2022-07-23 DIAGNOSIS — Z87891 Personal history of nicotine dependence: Secondary | ICD-10-CM | POA: Diagnosis not present

## 2022-07-23 DIAGNOSIS — R319 Hematuria, unspecified: Secondary | ICD-10-CM

## 2022-07-23 DIAGNOSIS — I509 Heart failure, unspecified: Secondary | ICD-10-CM | POA: Insufficient documentation

## 2022-07-23 DIAGNOSIS — R051 Acute cough: Secondary | ICD-10-CM

## 2022-07-23 LAB — URINALYSIS, ROUTINE W REFLEX MICROSCOPIC
Bacteria, UA: NONE SEEN
Bilirubin Urine: NEGATIVE
Glucose, UA: >=500 mg/dL — AB
Ketones, ur: NEGATIVE mg/dL
Leukocytes,Ua: NEGATIVE
Nitrite: NEGATIVE
Protein, ur: 100 mg/dL — AB
Specific Gravity, Urine: 1.016 (ref 1.005–1.030)
pH: 5 (ref 5.0–8.0)

## 2022-07-23 LAB — BASIC METABOLIC PANEL
Anion gap: 7 (ref 5–15)
BUN: 32 mg/dL — ABNORMAL HIGH (ref 8–23)
CO2: 25 mmol/L (ref 22–32)
Calcium: 9.4 mg/dL (ref 8.9–10.3)
Chloride: 107 mmol/L (ref 98–111)
Creatinine, Ser: 1.45 mg/dL — ABNORMAL HIGH (ref 0.61–1.24)
GFR, Estimated: 52 mL/min — ABNORMAL LOW (ref >60)
Glucose, Bld: 170 mg/dL — ABNORMAL HIGH (ref 70–99)
Potassium: 4.7 mmol/L (ref 3.5–5.1)
Sodium: 139 mmol/L (ref 135–145)

## 2022-07-23 LAB — RESP PANEL BY RT-PCR (FLU A&B, COVID) ARPGX2
Influenza A by PCR: NEGATIVE
Influenza B by PCR: NEGATIVE
SARS Coronavirus 2 by RT PCR: NEGATIVE

## 2022-07-23 LAB — CHLAMYDIA/NGC RT PCR (ARMC ONLY)
Chlamydia Tr: NOT DETECTED
N gonorrhoeae: NOT DETECTED

## 2022-07-23 LAB — CBC WITH DIFFERENTIAL/PLATELET
Abs Immature Granulocytes: 0.01 10*3/uL (ref 0.00–0.07)
Basophils Absolute: 0 10*3/uL (ref 0.0–0.1)
Basophils Relative: 1 %
Eosinophils Absolute: 0.1 10*3/uL (ref 0.0–0.5)
Eosinophils Relative: 3 %
HCT: 47 % (ref 39.0–52.0)
Hemoglobin: 15.1 g/dL (ref 13.0–17.0)
Immature Granulocytes: 0 %
Lymphocytes Relative: 34 %
Lymphs Abs: 1.4 10*3/uL (ref 0.7–4.0)
MCH: 31.3 pg (ref 26.0–34.0)
MCHC: 32.1 g/dL (ref 30.0–36.0)
MCV: 97.3 fL (ref 80.0–100.0)
Monocytes Absolute: 0.7 10*3/uL (ref 0.1–1.0)
Monocytes Relative: 17 %
Neutro Abs: 1.8 10*3/uL (ref 1.7–7.7)
Neutrophils Relative %: 45 %
Platelets: 208 10*3/uL (ref 150–400)
RBC: 4.83 MIL/uL (ref 4.22–5.81)
RDW: 15.5 % (ref 11.5–15.5)
WBC: 4 10*3/uL (ref 4.0–10.5)
nRBC: 0 % (ref 0.0–0.2)

## 2022-07-23 MED ORDER — CLOTRIMAZOLE 1 % EX CREA: 1.0000 | TOPICAL_CREAM | Freq: Two times a day (BID) | CUTANEOUS | 0 refills | Status: AC

## 2022-07-23 MED ORDER — GUAIFENESIN-DM 100-10 MG/5ML PO SYRP: 5.0000 mL | ORAL_SOLUTION | ORAL | 0 refills | Status: AC | PRN

## 2022-07-23 NOTE — ED Triage Notes (Signed)
Pt presents to the ED via POV due to cough x1 week and hematuria this morning. Pt denies any other urinary symptom. Denies N/D. Pt A&Ox4

## 2022-07-23 NOTE — ED Notes (Signed)
Pt states he took Dramamine and Robitussin for symptoms which did not help. Pt vomited once while in flex.

## 2022-07-23 NOTE — ED Provider Notes (Signed)
[facility] Provider Note    Event Date/Time   First MD Initiated Contact with Patient 07/23/22 1355     (approximate)   History   Cough and Hematuria   HPI  Jorge Cruz is a 71 y.o. male   Past medical history of smoker, CHF, CAD Kd, hypertension, paroxysmal A-fib presents with several days of nonproductive cough.  No fevers or chills or chest pain.  Daily smoker.  Also had an episode of hematuria today, no dysuria or abdominal pain.  No flank pain.  No discharge from the penis or testicular pain.  He has some irritation to his foreskin. He had an episode of posttussive emesis in the emergency department. History was obtained via the patient.  His wife is available as an independent historian who corroborates the information as above.      Physical Exam   Triage Vital Signs: ED Triage Vitals  Enc Vitals Group     BP 07/23/22 1150 134/87     Pulse Rate 07/23/22 1150 82     Resp 07/23/22 1150 18     Temp 07/23/22 1150 98.4 F (36.9 C)     Temp Source 07/23/22 1150 Oral     SpO2 07/23/22 1150 96 %     Weight --      Height --      Head Circumference --      Peak Flow --      Pain Score 07/23/22 1151 3     Pain Loc --      Pain Edu? --      Excl. in GC? --     Most recent vital signs: Vitals:   07/23/22 1150  BP: 134/87  Pulse: 82  Resp: 18  Temp: 98.4 F (36.9 C)  SpO2: 96%    General: Awake, no distress.  CV:  Good peripheral perfusion.  Resp:  Normal effort.  clear without focality or wheezing. Abd:  No distention.  Other:  His foreskin has some very mild erythema.   ED Results / Procedures / Treatments   Labs (all labs ordered are listed, but only abnormal results are displayed) Labs Reviewed  BASIC METABOLIC PANEL - Abnormal; Notable for the following components:      Result Value   Glucose, Bld 170 (*)    BUN 32 (*)    Creatinine, Ser 1.45 (*)    GFR, Estimated 52 (*)    All other components within normal limits   URINALYSIS, ROUTINE W REFLEX MICROSCOPIC - Abnormal; Notable for the following components:   Color, Urine YELLOW (*)    APPearance CLEAR (*)    Glucose, UA >=500 (*)    Hgb urine dipstick SMALL (*)    Protein, ur 100 (*)    All other components within normal limits  RESP PANEL BY RT-PCR (FLU A&B, COVID) ARPGX2  CHLAMYDIA/NGC RT PCR ([facility] ONLY)            CBC WITH DIFFERENTIAL/PLATELET     I reviewed labs and they are notable for creatinine is 1.45 at baseline.  H&H is normal.  RADIOLOGY I independently reviewed and interpreted chest x-ray is clear without focalities or pneumothorax   PROCEDURES:  Critical Care performed: No  Procedures   MEDICATIONS ORDERED IN ED: Medications - No data to display   IMPRESSION / MDM / ASSESSMENT AND PLAN / ED COURSE  I reviewed the triage vital signs and the nursing notes.  Differential diagnosis includes, but is not limited to, bronchitis, viral URI, bacterial pneumonia, ACS or PE less likely.  Hematuria due to balanoposthitis, urinary tract infection, STI, malignancy.    MDM: Overall well-appearing patient with bronchitis, nonfocal lung exam with negative chest x-ray and normal labs.  No fever.  Doubt bacterial pneumonia.  Advised to cut down on smoking.  Antitussive prescribed.  Posttussive emesis, doubt intra abdominal pathologies.  No chest pain to suggest ACS.  Doubt PE.  He had some blood in his urine negative UA for urinary tract infection and negative STI testing.  I suspect some element of balanoposthitis and prescribed clotrimazole cleaning.  He will follow-up with urology if persistent hematuria.  Follow-up with PMD for cough.  Return precautions given.   Patient's presentation is most consistent with acute presentation with potential threat to life or bodily function.       FINAL CLINICAL IMPRESSION(S) / ED DIAGNOSES   Final diagnoses:  Acute cough  Hematuria, unspecified type   Balanoposthitis     Rx / DC Orders   ED Discharge Orders          Ordered    clotrimazole (CLOTRIMAZOLE ANTI-FUNGAL) 1 % cream  2 times daily        07/23/22 1422    guaiFENesin-dextromethorphan (ROBITUSSIN DM) 100-10 MG/5ML syrup  Every 4 hours PRN        07/23/22 1422             Note:  This document was prepared using Dragon voice recognition software and may include unintentional dictation errors.    Pilar Jarvis, MD 07/23/22 1426

## 2022-07-23 NOTE — Discharge Instructions (Addendum)
Cut down on your smoking which will help with your cough.  Take medications as prescribed.  Follow-up with your regular doctor this week for checkup.  If you continue to see blood in your urine follow-up with urologist at the number attached.  Thank you for choosing [place] for your health care today!  Please see your primary doctor this week for a follow up appointment.   If you do not have a primary doctor call the following clinics to establish care:  If you have insurance:  [facility] [phone] [address] [place] [place] [address]   [facility]  [phone] [address] [place] [place] [address]   If you do not have insurance:  [facility]  [phone] [address] [place] [address]  Sometimes, in the early stages of certain disease courses it is difficult to detect in the emergency department evaluation -- so, it is important that you continue to monitor your symptoms and call your doctor right away or return to the emergency department if you develop any new or worsening symptoms.  It was my pleasure to care for you today.   Daneil Dan Modesto Charon, MD

## 2022-07-23 NOTE — ED Provider Triage Note (Signed)
Emergency Medicine Provider Triage Evaluation Note  Jorge Cruz , a 71 y.o. male  was evaluated in triage.  Pr with history of CHF complains of cough and chest congestion for the past week. No fever or LE swelling. He has also had blood in his urine that he noticed this morning. No history of hematuria. Marland Kitchen   Physical Exam  BP 134/87 (BP Location: Left Arm)   Pulse 82   Temp 98.4 F (36.9 C) (Oral)   Resp 18   SpO2 96%  Gen:   Awake, no distress   Resp:  Normal effort  MSK:   Moves extremities without difficulty  Other:    Medical Decision Making  Medically screening exam initiated at 11:52 AM.  Appropriate orders placed.  Vondell Babers was informed that the remainder of the evaluation will be completed by another provider, this initial triage assessment does not replace that evaluation, and the importance of remaining in the ED until their evaluation is complete.    Chinita Pester, FNP 07/23/22 1156

## 2022-08-02 ENCOUNTER — Telehealth: Payer: Self-pay | Admitting: Family

## 2022-08-02 NOTE — Telephone Encounter (Signed)
Results obtained from PCP office dated 07/05/22:   Creatinine 1.21 BUN 20 eGFR 64 Sodium 143 Potassium 5.1 Cholesterol 116 Triglycerides 100 LCL 55 HDL 42 Hemoglobin A1c 7.1% Uric acid 4.9

## 2022-08-04 ENCOUNTER — Encounter: Payer: Self-pay | Admitting: Family Medicine

## 2022-08-19 ENCOUNTER — Other Ambulatory Visit: Payer: Self-pay | Admitting: Internal Medicine

## 2022-08-19 ENCOUNTER — Other Ambulatory Visit: Payer: Self-pay | Admitting: Family

## 2023-01-17 ENCOUNTER — Encounter: Payer: Self-pay | Admitting: Family

## 2023-01-17 ENCOUNTER — Ambulatory Visit: Payer: 59 | Attending: Family | Admitting: Family

## 2023-01-17 VITALS — BP 116/60 | HR 63 | Wt 223.0 lb

## 2023-01-17 DIAGNOSIS — I5022 Chronic systolic (congestive) heart failure: Secondary | ICD-10-CM | POA: Diagnosis not present

## 2023-01-17 DIAGNOSIS — J449 Chronic obstructive pulmonary disease, unspecified: Secondary | ICD-10-CM | POA: Insufficient documentation

## 2023-01-17 DIAGNOSIS — N189 Chronic kidney disease, unspecified: Secondary | ICD-10-CM | POA: Diagnosis not present

## 2023-01-17 DIAGNOSIS — Z95810 Presence of automatic (implantable) cardiac defibrillator: Secondary | ICD-10-CM | POA: Insufficient documentation

## 2023-01-17 DIAGNOSIS — I2510 Atherosclerotic heart disease of native coronary artery without angina pectoris: Secondary | ICD-10-CM | POA: Diagnosis present

## 2023-01-17 DIAGNOSIS — I480 Paroxysmal atrial fibrillation: Secondary | ICD-10-CM | POA: Insufficient documentation

## 2023-01-17 DIAGNOSIS — I10 Essential (primary) hypertension: Secondary | ICD-10-CM | POA: Diagnosis not present

## 2023-01-17 DIAGNOSIS — E1122 Type 2 diabetes mellitus with diabetic chronic kidney disease: Secondary | ICD-10-CM | POA: Diagnosis not present

## 2023-01-17 DIAGNOSIS — I130 Hypertensive heart and chronic kidney disease with heart failure and stage 1 through stage 4 chronic kidney disease, or unspecified chronic kidney disease: Secondary | ICD-10-CM | POA: Diagnosis not present

## 2023-01-17 DIAGNOSIS — I428 Other cardiomyopathies: Secondary | ICD-10-CM | POA: Insufficient documentation

## 2023-01-17 DIAGNOSIS — Z79899 Other long term (current) drug therapy: Secondary | ICD-10-CM | POA: Diagnosis not present

## 2023-01-17 DIAGNOSIS — N1832 Chronic kidney disease, stage 3b: Secondary | ICD-10-CM

## 2023-01-17 DIAGNOSIS — Z720 Tobacco use: Secondary | ICD-10-CM

## 2023-01-17 MED ORDER — CARVEDILOL 6.25 MG PO TABS
6.2500 mg | ORAL_TABLET | Freq: Two times a day (BID) | ORAL | 3 refills | Status: DC
Start: 2023-01-17 — End: 2023-08-11

## 2023-01-17 NOTE — Patient Instructions (Signed)
Start taking carvedilol as 1 tablet in the morning and 1 tablet in the evening when it comes in the mail.

## 2023-01-17 NOTE — Progress Notes (Signed)
[facility] HEART FAILURE CLINIC - Pharmacist Note  Jorge Cruz is a 72 y.o. 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 Patient is here with his wife today. They report no issues on his current regimen and no access issues. Patient has switched to [facility] pharmacy for pill packs, but the carvedilol was not transferred over. Per [facility], last fill of coreg was 08/01/2022 x100d. We will resume carvedilol today, but have it sent to [facility] to be included in his pill packs moving forward. He denies any signs or symptoms of volume overload. He also reports that he weighs himself about daily and that his weight is stable.   Recent ED Visit (past 6 months):  Date: 07/23/2022, CC: cough, hematuria  Guideline-Directed Medical Therapy/Evidence Based Medicine ACE/ARB/ARNI: Sacubitril/valsartan 97/103 mg twice daily Beta Blocker: Carvedilol 12.5 mg twice daily Aldosterone Antagonist: none, developed hyperkalemia Diuretic: Furosemide 20 mg daily SGLT2i: Dapagliflozin 10 mg daily  Adherence Assessment Do you ever forget to take your medication? [] Yes [x] No  Do you ever skip doses due to side effects? [x] Yes [] No  Do you have trouble affording your medicines? [] Yes [x] No  Are you ever unable to pick up your medication due to transportation difficulties? [] Yes [x] No  Do you ever stop taking your medications because you don't believe they are helping? [] Yes [x] No  Do you check your weight daily? [x] Yes [] No  Adherence strategy: pill packs Barriers to obtaining medications: none reported   Diagnostics ECHO: Date 08/20/2021, EF 30-35%, GHK, G1DD  Vitals    07/23/2022   11:50 AM 07/19/2022    9:03 AM 03/08/2022   10:21 AM  Vitals with BMI  Height   5\' 7"   Weight  222 lbs 221 lbs 6 oz  BMI   34.66  Systolic 134 123 295  Diastolic 87 80 60  Pulse 82 80 64     Recent Labs    Latest Ref Rng & Units 07/23/2022   11:59 AM 03/08/2022   11:18 AM 11/26/2021    9:57  AM  BMP  Glucose 70 - 99 mg/dL 621  308  657   BUN 8 - 23 mg/dL 32  37  37   Creatinine 0.61 - 1.24 mg/dL 8.46  9.62  9.52   Sodium 135 - 145 mmol/L 139  140  141   Potassium 3.5 - 5.1 mmol/L 4.7  4.7  4.8   Chloride 98 - 111 mmol/L 107  109  107   CO2 22 - 32 mmol/L 25  27  27    Calcium 8.9 - 10.3 mg/dL 9.4  9.4  9.2     Past Medical History Past Medical History:  Diagnosis Date   Chronic combined systolic (congestive) and diastolic (congestive) heart failure (HCC)    a. 03/2014 Echo: EF 45%, glob HK; b. 03/2016 Echo: EF 20-25%, diff HK, Gr1 DD, mild MR; b. 11/2017 Echo: EF 20%, diff HK, Gr2 DD, mildly to mod reduced RV fxn, PASP .   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 03/2014 Echo: EF 45%, glob HK, mild conc LVH; b. 03/2014 MV: possible mild ischemia, superimposed on small inf infarct, EF 36%; c. 03/2016 MV: EF <  30%, no ischemia; d. 03/2016 Echo: EF 20-25%, diff HK, Gr1 DD, mild MR; e. 05/2017 Cath: nonobs dzs, EF 20%; f. 11/2017 Echo: EF 20%, diff HK, Gr2 DD.   Non-obstructive CAD (coronary artery disease)    a. 05/2017 Cath: LM nl, LAD 63m, LCX 41m, RCA 44m, EF 20%.   Paroxysmal atrial fibrillation (HCC)    Polysubstance abuse (HCC)    Tobacco abuse     Plan Continue regimen as directed by NP Resume carvedilol at 6.25 mg twice daily Annual echo due 07/2022  Time spent: 15 minutes  Celene Squibb, PharmD PGY1 Pharmacy Resident 01/17/2023 1:07 PM

## 2023-01-17 NOTE — Progress Notes (Signed)
PCP: [facility] (last seen 11/23) Primary Cardiologist: Yvonne Kendall, MD(last seen 07/23)  HPI:  Jorge Cruz is a 72 y/o male with a history of DM, COPD, HTN, nonobstructive CAD, PAF, CKD, current tobacco use and chronic heart failure. Had ICD implanted 05/21 monitored through [facility].   Remote treadmill MPI in 03/2016 showed no evidence of perfusion defects with an EF of less than 30% and was overall an intermediate risk study. Subsequent R/LHC in 05/2017 showed mild nonobstructive CAD as outlined below with an EF of 20%. RHC showed normal filling pressures, normal pulmonary pressure, and moderately reduced cardiac output.   Echo 08/20/21: EF of 30-35%. Echo 07/06/20: EF of 25%. Echo 10/11/19: EF of 30-35% along with trivial Jorge. Echo 11/25/17: EF of 20-25% along with a mildly elevated PA pressure of 35 mm Hg. Echo 04/01/16: EF of 20-25% along with mild Jorge.   Cardiac catheterization 06/01/17: EF of 20% along with mild nonobstructive CAD. RHC also done which showed normal filling pressures, normal pulmonary pressure and moderately reduced cardiac output. Cardiac output was 3.82 with a cardiac index of 1.83.  Was in the ED 07/23/22 due to an acute cough. Thought to be due to bronchitis.    He presents today for a HF follow-up visit with a chief complaint of minimal fatigue with moderate exertion. This is chronic in nature. He has associated Intermittent chest pain (resolves with NTG) along with dizziness and headaches. Denies any SOB, palpitations, edema, weight gain or difficulty sleeping. Continues to smoke 1/2 ppd. Last saw his PCP ~ 3 months ago.  Is unclear what medications he takes because he gets them pill packed. Brought a pill pack dated 02/24 but carvedilol is not in the pill pack.   ROS: All systems negative except as listed in HPI, PMH and Problem List.  SH:  Social History   Socioeconomic History   Marital status: Married    Spouse name: Not on file   Number of  children: Not on file   Years of education: Not on file   Highest education level: Not on file  Occupational History   Not on file  Tobacco Use   Smoking status: Every Day    Packs/day: 0.50    Years: 52.00    Additional pack years: 0.00    Total pack years: 26.00    Types: Cigarettes   Smokeless tobacco: Never  Vaping Use   Vaping Use: Never used  Substance and Sexual Activity   Alcohol use: Yes    Alcohol/week: 4.0 - 5.0 standard drinks of alcohol    Types: 1 - 2 Cans of beer, 3 Shots of liquor per week    Comment: occassional drink; past-1/2 to 1 pint liquor a week   Drug use: Not Currently    Types: Cocaine    Comment: 3 years   Sexual activity: Not Currently  Other Topics Concern   Not on file  Social History Narrative   Not on file   Social Determinants of Health   Financial Resource Strain: Not on file  Food Insecurity: Not on file  Transportation Needs: Not on file  Physical Activity: Not on file  Stress: Not on file  Social Connections: Not on file  Intimate Partner Violence: Not on file    FH:  Family History  Problem Relation Age of Onset   Diabetes Mother    Diabetes Father     Past Medical History:  Diagnosis Date   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 03/2014 Echo: EF 45%, glob HK; b. 03/2016 Echo: EF 20-25%, diff HK, Gr1 DD, mild Jorge; b. 11/2017 Echo: EF 20%, diff HK, Gr2 DD, mildly to mod reduced RV fxn, PASP .   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 03/2014 Echo: EF 45%, glob HK, mild conc LVH; b. 03/2014 MV: possible mild ischemia, superimposed on small inf infarct, EF 36%; c. 03/2016 MV:  EF <30%, no ischemia; d. 03/2016 Echo: EF 20-25%, diff HK, Gr1 DD, mild Jorge; e. 05/2017 Cath: nonobs dzs, EF 20%; f. 11/2017 Echo: EF 20%, diff HK, Gr2 DD.   Non-obstructive CAD (coronary artery disease)    a. 05/2017 Cath: LM nl, LAD 67m, LCX 41m, RCA 49m, EF 20%.   Paroxysmal atrial fibrillation (HCC)    Polysubstance abuse (HCC)    Tobacco abuse     Current Outpatient Medications  Medication Sig Dispense Refill   albuterol (PROVENTIL HFA;VENTOLIN HFA) 108 (90 Base) MCG/ACT inhaler Inhale 2 puffs into the lungs every 6 (six) hours as needed for wheezing or shortness of breath.     allopurinol (ZYLOPRIM) 100 MG tablet Take 1 tablet by mouth daily.     apixaban (ELIQUIS) 5 MG TABS tablet Take 5 mg by mouth 2 (two) times daily.     atorvastatin (LIPITOR) 80 MG tablet Take 80 mg by mouth daily.     carvedilol (COREG) 12.5 MG tablet Take 1 tablet (12.5 mg total) by mouth 2 (two) times daily. 180 tablet 3   clotrimazole (CLOTRIMAZOLE ANTI-FUNGAL) 1 % cream Apply 1 Application topically 2 (two) times daily. Apply to the foreskin and head of the penis 30 g 0   FARXIGA 10 MG TABS tablet TAKE 1 TABLET BY MOUTH ONCE DAILY BEFORE BREAKFAST 30 tablet 10   Fluticasone-Salmeterol (ADVAIR) 250-50 MCG/DOSE AEPB Inhale 1 puff into the lungs 2 (two) times daily.     furosemide (LASIX) 20 MG tablet Take 20 mg by mouth daily as needed.     guaiFENesin-dextromethorphan (ROBITUSSIN DM) 100-10 MG/5ML syrup Take 5 mLs by mouth every 4 (four) hours as needed for cough. 118 mL 0   magnesium oxide (MAG-OX) 400 MG tablet Take 1 tablet by mouth 2 (two) times daily.     nitroGLYCERIN (NITROSTAT) 0.4 MG SL tablet DISSOLVE 1 TABLET UNDER THE  TONGUE EVERY 5 MINUTES AS NEEDED FOR CHEST PAIN. MAX OF 3 TABLETS IN 15 MINUTES. CALL 911 IF PAIN  PERSISTS. (Patient not taking: Reported on 07/19/2022) 100 tablet 3   sacubitril-valsartan (ENTRESTO) 97-103 MG Take 1 tablet by mouth 2 (two) times daily. 180 tablet 3   No current  facility-administered medications for this visit.   Vitals:   01/17/23 1309  BP: 116/60  Pulse: 63  SpO2: 100%  Weight: 223 lb (101.2 kg)   Wt Readings from Last 3 Encounters:  01/17/23 223 lb (101.2 kg)  07/19/22 222 lb (100.7 kg)  03/08/22 221 lb 6 oz (100.4 kg)   Lab Results  Component Value Date   CREATININE 1.45 (H) 07/23/2022   CREATININE 1.49 (H) 03/08/2022   CREATININE 1.67 (H) 11/26/2021   PHYSICAL EXAM:  General:  Well appearing. No resp  difficulty HEENT: normal Neck: supple. JVP flat. No lymphadenopathy or thryomegaly appreciated. Cor: PMI normal. Regular rate & rhythm. No rubs, gallops or murmurs. Lungs: clear Abdomen: soft, nontender, nondistended. No hepatosplenomegaly. No bruits or masses.  Extremities: no cyanosis, clubbing, rash, edema Neuro: alert & orientedx3, cranial nerves grossly intact. Moves all 4 extremities w/o difficulty. Affect pleasant.   ECG: not done   ASSESSMENT & PLAN:  1: NICM with reduced ejection fraction- - likely due to HTN - NYHA class II - euvolemic today - weighing himself every other day; encouraged to resume daily weighing so that he can call for an overnight weight gain of >2 pounds or a weekly weight gain of >5 pounds - weight stable since he was last here 6 months ago - Echo 08/20/21: EF of 30-35%.  - Echo 07/06/20: EF of 25%.  - Echo 10/11/19: EF of 30-35% along with trivial Jorge.  - Echo 11/25/17: EF of 20-25% along with a mildly elevated PA pressure of 35 mm Hg.  - Echo 04/01/16: EF of 20-25% along with mild Jorge.  - Cardiac catheterization 06/01/17: EF of 20% along with mild nonobstructive CAD. RHC also done which showed normal filling pressures, normal pulmonary pressure and moderately reduced cardiac output. Cardiac output was 3.82 with a cardiac index of 1.83. - not adding salt and has been reading food labels. Reviewed the importance of keeping sodium intake to 2000mg  daily.  - saw cardiology ([place]) 03/08/22 - continue  farxiga 10mg  daily - continue entresto 97/103mg  BID - patient still doesn't have carvedilol in his pill pack; pharmD called [facility] who said that he's now using [facility] mailorder; RX for 6.25mg  carvedilol BID send in with the hopes of titrating this at future visits - previously had been on spironolactone but he developed hyperkalemia; could consider rechallenge - taking furosemide PRN but he can't remember the last time he took it - updated echo has been scheduled for 02/08/23 - had ICD implantation 01/02/20; denies any firing of ICD; being followed by [facility] - BNP 11/26/21 was 81.3 - PharmD reconciled medications with the patient  2: HTN- - BP 116/60 - BMP from 07/23/22 reviewed and shows sodium 139, potassium 4.7, creatinine 1.45 and GFR 52 - saw PCP [facility]) 11/23  3: DM- - A1c on 04/11/20 was 6.0%  4:Tobacco use-  - smoking 1/2-1 ppd of cigarettes & expresses no desire to quit - explained that there is the availability of a health coach to assist with this when he's ready - has ~ 4 ounces of crown royal at bedtime but not on a nightly basis - complete cessation discussed for 3 minutes with the patient  Return in 1 month, sooner if needed

## 2023-01-31 ENCOUNTER — Telehealth: Payer: Self-pay

## 2023-01-31 NOTE — Telephone Encounter (Signed)
   Pre-operative Risk Assessment    Patient Name: Jorge Cruz  DOB: 09-18-1950 MRN: [id number]      Request for Surgical Clearance    Procedure:   CATARACT EXTRACTION BY PE, IOL - RIGHT   Date of Surgery:  Clearance 02/10/23                                 Surgeon:  DR. Hedda Slade Surgeon's Group or Practice Name:  [facility] Phone number:  [phone] EX. [phone] Fax number:  [phone]   Type of Clearance Requested:   - Medical  - Pharmacy:  Hold Apixaban (Eliquis) NEEDS INSTRUCTIONS WHEN TO HOLD.    Type of Anesthesia:   IV SEDATION    Additional requests/questions:    Signed, Michaelle Copas   01/31/2023, 4:59 PM

## 2023-02-01 NOTE — Telephone Encounter (Signed)
   Patient Name: Jorge Cruz  DOB: 1950/09/10 MRN: [id number]  Primary Cardiologist: Yvonne Kendall, MD  Chart reviewed as part of pre-operative protocol coverage. Cataract extractions are recognized in guidelines as low risk surgeries that do not typically require specific preoperative testing or holding of blood thinner therapy. Therefore, given past medical history and time since last visit, based on ACC/AHA guidelines, [name] would be at acceptable risk for the planned procedure without further cardiovascular testing.   I will route this recommendation to the requesting party via Epic fax function and remove from pre-op pool.  Please call with questions.  Levi Aland, NP-C  02/01/2023, 8:42 AM [address], [address] Office [phone] Fax [phone]

## 2023-02-03 NOTE — Telephone Encounter (Signed)
Per review, Pt's device is not followed by [facility].  Pt sees [facility] -- Rosemary Holms.  Epic faxed to that office.

## 2023-02-03 NOTE — Telephone Encounter (Signed)
Carney Bern, from [facility] called stating patient has a defibrillator, they want to make sure the device is cleared as well.

## 2023-02-07 NOTE — Telephone Encounter (Signed)
Spoke with [place] from [facility] informed her that patient's device  was last checked at [facility] on 03/2022, per epic.

## 2023-02-07 NOTE — Telephone Encounter (Signed)
The request is asking for device clearance. This does not need to come back to pre op as it needs to be addressed by device clinic.

## 2023-02-07 NOTE — Telephone Encounter (Signed)
   Morrie Sheldon with [facility] calling. She said, they need to receive something in writing that we don't follow pt's device

## 2023-02-08 ENCOUNTER — Ambulatory Visit
Admission: RE | Admit: 2023-02-08 | Discharge: 2023-02-08 | Disposition: A | Discharge status: Home / Self Care | Payer: 59 | Source: Ambulatory Visit | Attending: Family | Admitting: Family

## 2023-02-08 DIAGNOSIS — I5022 Chronic systolic (congestive) heart failure: Secondary | ICD-10-CM | POA: Diagnosis present

## 2023-02-08 LAB — ECHOCARDIOGRAM COMPLETE
AR max vel: 1.96 cm2
AV Area VTI: 2 cm2
AV Area mean vel: 2.06 cm2
AV Mean grad: 3.5 mmHg
AV Peak grad: 6.1 mmHg
Ao pk vel: 1.23 m/s
Calc EF: 27.3 %
S' Lateral: 4.7 cm
Single Plane A2C EF: 23.8 %
Single Plane A4C EF: 29.6 %

## 2023-02-08 NOTE — Progress Notes (Signed)
*  PRELIMINARY RESULTS* Echocardiogram 2D Echocardiogram has been performed.  Jorge Cruz 02/08/2023, 11:40 AM

## 2023-02-09 ENCOUNTER — Telehealth: Payer: Self-pay | Admitting: Internal Medicine

## 2023-02-09 NOTE — Telephone Encounter (Signed)
[facility] stated they'll need a device clearance form faxed over to them at [phone] with [facility] so that they can complete the Device clearance requested from them. Please advise  (See 01/31/2023 phone encounter)

## 2023-02-09 NOTE — Telephone Encounter (Signed)
Routed via [employer] to [facility].  Pt is not followed for device.

## 2023-02-16 ENCOUNTER — Other Ambulatory Visit: Payer: Self-pay | Admitting: Physician Assistant

## 2023-02-17 ENCOUNTER — Encounter: Payer: 59 | Admitting: Family

## 2023-02-17 ENCOUNTER — Telehealth: Payer: Self-pay | Admitting: Family

## 2023-02-17 NOTE — Telephone Encounter (Signed)
Patient did not show for his Heart Failure Clinic appointment on 02/17/23

## 2023-02-17 NOTE — Progress Notes (Deleted)
PCP: [facility] (last seen 11/23) Primary Cardiologist: Yvonne Kendall, MD(last seen 07/23)  HPI:  Jorge Cruz is a 72 y/o male with a history of DM, COPD, HTN, nonobstructive CAD, PAF, CKD, current tobacco use and chronic heart failure. Had ICD implanted 05/21 monitored through [facility].   Remote treadmill MPI in 03/2016 showed no evidence of perfusion defects with an EF of less than 30% and was overall an intermediate risk study. Subsequent R/LHC in 05/2017 showed mild nonobstructive CAD as outlined below with an EF of 20%. RHC showed normal filling pressures, normal pulmonary pressure, and moderately reduced cardiac output.   Echo 08/20/21: EF of 30-35%. Echo 07/06/20: EF of 25%. Echo 10/11/19: EF of 30-35% along with trivial Jorge. Echo 11/25/17: EF of 20-25% along with a mildly elevated PA pressure of 35 mm Hg. Echo 04/01/16: EF of 20-25% along with mild Jorge.   Cardiac catheterization 06/01/17: EF of 20% along with mild nonobstructive CAD. RHC also done which showed normal filling pressures, normal pulmonary pressure and moderately reduced cardiac output. Cardiac output was 3.82 with a cardiac index of 1.83.  Was in the ED 07/23/22 due to an acute cough. Thought to be due to bronchitis.    He presents today for a HF follow-up visit with a chief complaint of minimal fatigue with moderate exertion. This is chronic in nature. He has associated Intermittent chest pain (resolves with NTG) along with dizziness and headaches. Denies any SOB, palpitations, edema, weight gain or difficulty sleeping. Continues to smoke 1/2 ppd. Last saw his PCP ~ 3 months ago.  Is unclear what medications he takes because he gets them pill packed. Brought a pill pack dated 02/24 but carvedilol is not in the pill pack.   ROS: All systems negative except as listed in HPI, PMH and Problem List.  SH:  Social History   Socioeconomic History   Marital status: Married    Spouse name: Not on file   Number of  children: Not on file   Years of education: Not on file   Highest education level: Not on file  Occupational History   Not on file  Tobacco Use   Smoking status: Every Day    Packs/day: 0.50    Years: 52.00    Additional pack years: 0.00    Total pack years: 26.00    Types: Cigarettes   Smokeless tobacco: Never  Vaping Use   Vaping Use: Never used  Substance and Sexual Activity   Alcohol use: Yes    Alcohol/week: 4.0 - 5.0 standard drinks of alcohol    Types: 1 - 2 Cans of beer, 3 Shots of liquor per week    Comment: occassional drink; past-1/2 to 1 pint liquor a week   Drug use: Not Currently    Types: Cocaine    Comment: 3 years   Sexual activity: Not Currently  Other Topics Concern   Not on file  Social History Narrative   Not on file   Social Determinants of Health   Financial Resource Strain: Not on file  Food Insecurity: Not on file  Transportation Needs: Not on file  Physical Activity: Not on file  Stress: Not on file  Social Connections: Not on file  Intimate Partner Violence: Not on file    FH:  Family History  Problem Relation Age of Onset   Diabetes Mother    Diabetes Father     Past Medical History:  Diagnosis Date   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 03/2014 Echo: EF 45%, glob HK; b. 03/2016 Echo: EF 20-25%, diff HK, Gr1 DD, mild Jorge; b. 11/2017 Echo: EF 20%, diff HK, Gr2 DD, mildly to mod reduced RV fxn, PASP .   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 03/2014 Echo: EF 45%, glob HK, mild conc LVH; b. 03/2014 MV: possible mild ischemia, superimposed on small inf infarct, EF 36%; c. 03/2016 MV:  EF <30%, no ischemia; d. 03/2016 Echo: EF 20-25%, diff HK, Gr1 DD, mild Jorge; e. 05/2017 Cath: nonobs dzs, EF 20%; f. 11/2017 Echo: EF 20%, diff HK, Gr2 DD.   Non-obstructive CAD (coronary artery disease)    a. 05/2017 Cath: LM nl, LAD 66m, LCX 50m, RCA 61m, EF 20%.   Paroxysmal atrial fibrillation (HCC)    Polysubstance abuse (HCC)    Tobacco abuse     Current Outpatient Medications  Medication Sig Dispense Refill   albuterol (PROVENTIL HFA;VENTOLIN HFA) 108 (90 Base) MCG/ACT inhaler Inhale 2 puffs into the lungs every 6 (six) hours as needed for wheezing or shortness of breath.     allopurinol (ZYLOPRIM) 100 MG tablet Take 1 tablet by mouth daily.     apixaban (ELIQUIS) 5 MG TABS tablet Take 5 mg by mouth 2 (two) times daily.     atorvastatin (LIPITOR) 80 MG tablet Take 80 mg by mouth daily.     carvedilol (COREG) 6.25 MG tablet Take 1 tablet (6.25 mg total) by mouth 2 (two) times daily. 180 tablet 3   clotrimazole (CLOTRIMAZOLE ANTI-FUNGAL) 1 % cream Apply 1 Application topically 2 (two) times daily. Apply to the foreskin and head of the penis 30 g 0   FARXIGA 10 MG TABS tablet TAKE 1 TABLET BY MOUTH ONCE DAILY BEFORE BREAKFAST 30 tablet 10   fluticasone (FLONASE) 50 MCG/ACT nasal spray Place 2 sprays into both nostrils daily.     guaiFENesin-dextromethorphan (ROBITUSSIN DM) 100-10 MG/5ML syrup Take 5 mLs by mouth every 4 (four) hours as needed for cough. 118 mL 0   magnesium oxide (MAG-OX) 400 MG tablet Take 1 tablet by mouth daily. (Patient not taking: Reported on 01/17/2023)     nitroGLYCERIN (NITROSTAT) 0.4 MG SL tablet DISSOLVE 1 TABLET UNDER THE  TONGUE EVERY 5 MINUTES AS NEEDED FOR CHEST PAIN. MAX OF 3 TABLETS IN 15 MINUTES. CALL 911 IF PAIN  PERSISTS. 25 tablet 0   sacubitril-valsartan (ENTRESTO) 97-103 MG Take 1 tablet by mouth 2 (two) times daily. 180 tablet 3   No current facility-administered medications for this visit.   There were no vitals filed for this visit.  Wt Readings  from Last 3 Encounters:  01/17/23 223 lb (101.2 kg)  07/19/22 222 lb (100.7 kg)  03/08/22 221 lb 6 oz (100.4 kg)   Lab Results  Component Value Date   CREATININE 1.45 (H) 07/23/2022   CREATININE 1.49 (H) 03/08/2022   CREATININE 1.67 (H) 11/26/2021   PHYSICAL EXAM:  General:  Well appearing. No resp difficulty HEENT: normal Neck: supple. JVP flat. No lymphadenopathy or thryomegaly appreciated. Cor: PMI normal. Regular rate & rhythm. No rubs, gallops or murmurs. Lungs: clear Abdomen: soft, nontender, nondistended. No hepatosplenomegaly. No bruits or  masses.  Extremities: no cyanosis, clubbing, rash, edema Neuro: alert & orientedx3, cranial nerves grossly intact. Moves all 4 extremities w/o difficulty. Affect pleasant.   ECG: not done   ASSESSMENT & PLAN:  1: NICM with reduced ejection fraction- - likely due to HTN - NYHA class II - euvolemic today - weighing himself every other day; encouraged to resume daily weighing so that he can call for an overnight weight gain of >2 pounds or a weekly weight gain of >5 pounds - weight stable since he was last here 6 months ago - Echo 08/20/21: EF of 30-35%.  - Echo 07/06/20: EF of 25%.  - Echo 10/11/19: EF of 30-35% along with trivial Jorge.  - Echo 11/25/17: EF of 20-25% along with a mildly elevated PA pressure of 35 mm Hg.  - Echo 04/01/16: EF of 20-25% along with mild Jorge.  - Cardiac catheterization 06/01/17: EF of 20% along with mild nonobstructive CAD. RHC also done which showed normal filling pressures, normal pulmonary pressure and moderately reduced cardiac output. Cardiac output was 3.82 with a cardiac index of 1.83. - not adding salt and has been reading food labels. Reviewed the importance of keeping sodium intake to 2000mg  daily.  - saw cardiology ([place]) 03/08/22 - continue farxiga 10mg  daily - continue entresto 97/103mg  BID - patient still doesn't have carvedilol in his pill pack; pharmD called [employer] who said that he's now using  [employer] mailorder; RX for 6.25mg  carvedilol BID send in with the hopes of titrating this at future visits - previously had been on spironolactone but he developed hyperkalemia; could consider rechallenge - taking furosemide PRN but he can't remember the last time he took it - updated echo has been scheduled for 02/08/23 - had ICD implantation 01/02/20; denies any firing of ICD; being followed by [facility] - BNP 11/26/21 was 81.3 - PharmD reconciled medications with the patient  2: HTN- - BP 116/60 - BMP from 07/23/22 reviewed and shows sodium 139, potassium 4.7, creatinine 1.45 and GFR 52 - saw PCP [facility]) 11/23  3: DM- - A1c on 04/11/20 was 6.0%  4:Tobacco use-  - smoking 1/2-1 ppd of cigarettes & expresses no desire to quit - explained that there is the availability of a health coach to assist with this when he's ready - has ~ 4 ounces of [place] royal at bedtime but not on a nightly basis - complete cessation discussed for 3 minutes with the patient  Return in 1 month, sooner if needed

## 2023-02-27 ENCOUNTER — Other Ambulatory Visit: Payer: Self-pay | Admitting: Physician Assistant

## 2023-02-28 NOTE — Telephone Encounter (Signed)
Please contact pt for future appointment. Pt overdue for f/u. 

## 2023-03-07 NOTE — Telephone Encounter (Signed)
LMOV  

## 2023-04-08 ENCOUNTER — Other Ambulatory Visit: Payer: Self-pay

## 2023-04-08 ENCOUNTER — Emergency Department: Payer: 59

## 2023-04-08 ENCOUNTER — Emergency Department
Admission: EM | Admit: 2023-04-08 | Discharge: 2023-04-08 | Disposition: A | Discharge status: Home / Self Care | Payer: 59 | Attending: Emergency Medicine | Admitting: Emergency Medicine

## 2023-04-08 DIAGNOSIS — Y990 Civilian activity done for income or pay: Secondary | ICD-10-CM | POA: Diagnosis not present

## 2023-04-08 DIAGNOSIS — I129 Hypertensive chronic kidney disease with stage 1 through stage 4 chronic kidney disease, or unspecified chronic kidney disease: Secondary | ICD-10-CM | POA: Diagnosis not present

## 2023-04-08 DIAGNOSIS — N189 Chronic kidney disease, unspecified: Secondary | ICD-10-CM | POA: Diagnosis not present

## 2023-04-08 DIAGNOSIS — W010XXA Fall on same level from slipping, tripping and stumbling without subsequent striking against object, initial encounter: Secondary | ICD-10-CM | POA: Diagnosis not present

## 2023-04-08 DIAGNOSIS — S63501A Unspecified sprain of right wrist, initial encounter: Secondary | ICD-10-CM | POA: Insufficient documentation

## 2023-04-08 DIAGNOSIS — E1122 Type 2 diabetes mellitus with diabetic chronic kidney disease: Secondary | ICD-10-CM | POA: Diagnosis not present

## 2023-04-08 DIAGNOSIS — I2510 Atherosclerotic heart disease of native coronary artery without angina pectoris: Secondary | ICD-10-CM | POA: Insufficient documentation

## 2023-04-08 DIAGNOSIS — M25531 Pain in right wrist: Secondary | ICD-10-CM | POA: Diagnosis present

## 2023-04-08 MED ORDER — OXYCODONE HCL 5 MG PO TABS
5.0000 mg | ORAL_TABLET | Freq: Once | ORAL | Status: AC
  Administered 2023-04-08: 5 mg via ORAL
  Filled 2023-04-08: qty 1

## 2023-04-08 NOTE — ED Notes (Signed)
Split and sling applied by Danielle NT and this RN. Tolerated well.

## 2023-04-08 NOTE — ED Triage Notes (Signed)
Pt presents to ED with c/o of R wrist pain due to a fall yesterday while working, pt state she tripped backwards over a pipe. R wrist does appear swollen and possibly deformed. CMS intact. Pt able to move fingers but endorses pain on movement.

## 2023-04-08 NOTE — ED Provider Notes (Signed)
[facility] Provider Note    Event Date/Time   First MD Initiated Contact with Patient 04/08/23 [phone]     (approximate)   History   Wrist Pain   HPI  Jorge Cruz is a 72 y.o. male with history of diabetes, hypertension, CAD, CKD presents emergency department with right wrist pain after a fall last night on outstretched hand.  Patient states pain and swelling worsened today.  No other injuries reported.  Patient not hit his head.      Physical Exam   Triage Vital Signs: ED Triage Vitals [04/08/23 0821]  Encounter Vitals Group     BP (!) 138/100     Systolic BP Percentile      Diastolic BP Percentile      Pulse Rate 74     Resp 18     Temp 97.9 F (36.6 C)     Temp Source Oral     SpO2 96 %     Weight 216 lb (98 kg)     Height 5\' 7"  (1.702 m)     Head Circumference      Peak Flow      Pain Score 8     Pain Loc      Pain Education      Exclude from Growth Chart     Most recent vital signs: Vitals:   04/08/23 0821  BP: (!) 138/100  Pulse: 74  Resp: 18  Temp: 97.9 F (36.6 C)  SpO2: 96%     General: Awake, no distress.   CV:  Good peripheral perfusion. regular rate and  rhythm Resp:  Normal effort.  Abd:  No distention.   Other:  Right wrist with swelling along the carpal bones and distal radius, decreased range of motion with dorsiflexion, neurovascular is intact, skin is intact   ED Results / Procedures / Treatments   Labs (all labs ordered are listed, but only abnormal results are displayed) Labs Reviewed - No data to display   EKG     RADIOLOGY X-ray of the right wrist    PROCEDURES:   Procedures   MEDICATIONS ORDERED IN ED: Medications  oxyCODONE (Oxy IR/ROXICODONE) immediate release tablet 5 mg (5 mg Oral Given 04/08/23 0911)     IMPRESSION / MDM / ASSESSMENT AND PLAN / ED COURSE  I reviewed the triage vital signs and the nursing notes.                              Differential diagnosis  includes, but is not limited to, fracture, sprain, contusion, dislocation  Patient's presentation is most consistent with acute complicated illness / injury requiring diagnostic workup.   X-ray of the right wrist  Patient placed in thumb spica OCL, given ice pack, given oxycodone 5 mg p.o. for pain   X-ray of the right wrist completely reviewed interpreted by me as being negative for acute abnormality  I did explain findings to patient.  Due to a fall on outstretched hand along with a mild swelling he has have concerns for a occult fracture.  Placed him in a thumb spica OCL.  He is to follow-up with orthopedics.  Return emergency department worsening.  Patient is in agreement treatment plan.  Discharged stable condition.   FINAL CLINICAL IMPRESSION(S) / ED DIAGNOSES   Final diagnoses:  Sprain of right wrist, initial encounter     Rx / DC Orders   ED Discharge  Orders     None        Note:  This document was prepared using Dragon voice recognition software and may include unintentional dictation errors.    Faythe Ghee, PA-C 04/08/23 4034    Jene Every, MD 04/08/23 1125

## 2023-05-25 ENCOUNTER — Other Ambulatory Visit: Payer: Self-pay | Admitting: Nurse Practitioner

## 2023-05-25 DIAGNOSIS — M542 Cervicalgia: Secondary | ICD-10-CM

## 2023-06-07 ENCOUNTER — Encounter: Payer: Self-pay | Admitting: Emergency Medicine

## 2023-07-14 ENCOUNTER — Other Ambulatory Visit: Payer: Self-pay | Admitting: Family

## 2023-07-21 ENCOUNTER — Emergency Department: Payer: 59

## 2023-07-21 ENCOUNTER — Other Ambulatory Visit: Payer: Self-pay

## 2023-07-21 ENCOUNTER — Emergency Department
Admission: EM | Admit: 2023-07-21 | Discharge: 2023-07-21 | Disposition: A | Discharge status: Home / Self Care | Payer: 59 | Attending: Emergency Medicine | Admitting: Emergency Medicine

## 2023-07-21 DIAGNOSIS — E1122 Type 2 diabetes mellitus with diabetic chronic kidney disease: Secondary | ICD-10-CM | POA: Insufficient documentation

## 2023-07-21 DIAGNOSIS — I2510 Atherosclerotic heart disease of native coronary artery without angina pectoris: Secondary | ICD-10-CM | POA: Insufficient documentation

## 2023-07-21 DIAGNOSIS — N189 Chronic kidney disease, unspecified: Secondary | ICD-10-CM | POA: Diagnosis not present

## 2023-07-21 DIAGNOSIS — R051 Acute cough: Secondary | ICD-10-CM | POA: Insufficient documentation

## 2023-07-21 DIAGNOSIS — J449 Chronic obstructive pulmonary disease, unspecified: Secondary | ICD-10-CM | POA: Insufficient documentation

## 2023-07-21 DIAGNOSIS — I509 Heart failure, unspecified: Secondary | ICD-10-CM | POA: Insufficient documentation

## 2023-07-21 DIAGNOSIS — R059 Cough, unspecified: Secondary | ICD-10-CM | POA: Diagnosis present

## 2023-07-21 DIAGNOSIS — Z20822 Contact with and (suspected) exposure to covid-19: Secondary | ICD-10-CM | POA: Insufficient documentation

## 2023-07-21 LAB — CBC
HCT: 45.4 % (ref 39.0–52.0)
Hemoglobin: 14.8 g/dL (ref 13.0–17.0)
MCH: 32.5 pg (ref 26.0–34.0)
MCHC: 32.6 g/dL (ref 30.0–36.0)
MCV: 99.6 fL (ref 80.0–100.0)
Platelets: 165 10*3/uL (ref 150–400)
RBC: 4.56 MIL/uL (ref 4.22–5.81)
RDW: 15.2 % (ref 11.5–15.5)
WBC: 4.2 10*3/uL (ref 4.0–10.5)
nRBC: 0 % (ref 0.0–0.2)

## 2023-07-21 LAB — SARS CORONAVIRUS 2 BY RT PCR: SARS Coronavirus 2 by RT PCR: NEGATIVE

## 2023-07-21 LAB — TROPONIN I (HIGH SENSITIVITY)
Troponin I (High Sensitivity): 90 ng/L — ABNORMAL HIGH (ref <18)
Troponin I (High Sensitivity): 92 ng/L — ABNORMAL HIGH (ref <18)

## 2023-07-21 LAB — COMPREHENSIVE METABOLIC PANEL
ALT: 18 U/L (ref 0–44)
AST: 21 U/L (ref 15–41)
Albumin: 3.9 g/dL (ref 3.5–5.0)
Alkaline Phosphatase: 81 U/L (ref 38–126)
Anion gap: 9 (ref 5–15)
BUN: 23 mg/dL (ref 8–23)
CO2: 24 mmol/L (ref 22–32)
Calcium: 9 mg/dL (ref 8.9–10.3)
Chloride: 107 mmol/L (ref 98–111)
Creatinine, Ser: 1.27 mg/dL — ABNORMAL HIGH (ref 0.61–1.24)
GFR, Estimated: >60 mL/min (ref >60)
Glucose, Bld: 144 mg/dL — ABNORMAL HIGH (ref 70–99)
Potassium: 4.1 mmol/L (ref 3.5–5.1)
Sodium: 140 mmol/L (ref 135–145)
Total Bilirubin: 0.8 mg/dL (ref <1.2)
Total Protein: 7.2 g/dL (ref 6.5–8.1)

## 2023-07-21 LAB — BRAIN NATRIURETIC PEPTIDE: B Natriuretic Peptide: 637.6 pg/mL — ABNORMAL HIGH (ref 0.0–100.0)

## 2023-07-21 MED ORDER — IPRATROPIUM-ALBUTEROL 0.5-2.5 (3) MG/3ML IN SOLN
3.0000 mL | Freq: Once | RESPIRATORY_TRACT | Status: AC
  Administered 2023-07-21: 3 mL via RESPIRATORY_TRACT
  Filled 2023-07-21: qty 3

## 2023-07-21 MED ORDER — FUROSEMIDE 20 MG PO TABS: 20.0000 mg | ORAL_TABLET | Freq: Every day | ORAL | 11 refills | Status: DC

## 2023-07-21 NOTE — Discharge Instructions (Addendum)
I would recommend taking your lasix 20 mg once a day.

## 2023-07-21 NOTE — ED Triage Notes (Signed)
Pt states that for the past week he has been coughing a lot especially when he lays down is worse, states has coughed so hard will vomit at times, reports a hx of chf and wants to make sure he's not having a flare up, reports some times his heart will race

## 2023-07-21 NOTE — ED Provider Notes (Signed)
[facility] Provider Note    Event Date/Time   First MD Initiated Contact with Patient 07/21/23 [phone]     (approximate)   History   Cough   HPI  Jorge Cruz is a 72 y.o. male with a history of CKD, COPD, diabetes, CAD, paroxysmal atrial fibrillation, polysubstance abuse who does follow-up with the heart failure clinic, reviewed office notes from May 28, patient appears to be taking Lasix only as needed.  He does smoke cigarettes still.  He denies fevers or chills.  No nausea vomiting or chest pain     Physical Exam   Triage Vital Signs: ED Triage Vitals  Encounter Vitals Group     BP 07/21/23 0902 (!) 153/79     Systolic BP Percentile --      Diastolic BP Percentile --      Pulse Rate 07/21/23 0902 80     Resp 07/21/23 0902 20     Temp 07/21/23 0902 98.4 F (36.9 C)     Temp Source 07/21/23 0902 Oral     SpO2 07/21/23 0902 98 %     Weight 07/21/23 0904 99.3 kg (219 lb)     Height 07/21/23 0904 1.727 m (5\' 8" )     Head Circumference --      Peak Flow --      Pain Score 07/21/23 0903 0     Pain Loc --      Pain Education --      Exclude from Growth Chart --     Most recent vital signs: Vitals:   07/21/23 0902 07/21/23 1222  BP: (!) 153/79 (!) 123/91  Pulse: 80 70  Resp: 20 18  Temp: 98.4 F (36.9 C)   SpO2: 98% 98%     General: Awake, no distress.  CV:  Good peripheral perfusion.  Resp:  Normal effort.  Scattered end expiratory wheezes, mild Abd:  No distention.  Other:  No significant lower extremity edema   ED Results / Procedures / Treatments   Labs (all labs ordered are listed, but only abnormal results are displayed) Labs Reviewed  COMPREHENSIVE METABOLIC PANEL - Abnormal; Notable for the following components:      Result Value   Glucose, Bld 144 (*)    Creatinine, Ser 1.27 (*)    All other components within normal limits  BRAIN NATRIURETIC PEPTIDE - Abnormal; Notable for the following components:   B Natriuretic  Peptide 637.6 (*)    All other components within normal limits  TROPONIN I (HIGH SENSITIVITY) - Abnormal; Notable for the following components:   Troponin I (High Sensitivity) 90 (*)    All other components within normal limits  TROPONIN I (HIGH SENSITIVITY) - Abnormal; Notable for the following components:   Troponin I (High Sensitivity) 92 (*)    All other components within normal limits  SARS CORONAVIRUS 2 BY RT PCR  CBC     EKG  ED ECG REPORT I, Jene Every, the attending physician, personally viewed and interpreted this ECG.  Date: 07/21/2023  Rhythm: normal sinus rhythm QRS Axis: normal Intervals: normal ST/T Wave abnormalities: normal Narrative Interpretation: no evidence of acute ischemia    RADIOLOGY Chest x-ray viewed interpret by me, no pulmonary edema    PROCEDURES:  Critical Care performed:   Procedures   MEDICATIONS ORDERED IN ED: Medications  ipratropium-albuterol (DUONEB) 0.5-2.5 (3) MG/3ML nebulizer solution 3 mL (3 mLs Nebulization Given 07/21/23 1011)     IMPRESSION / MDM / ASSESSMENT AND  PLAN / ED COURSE  I reviewed the triage vital signs and the nursing notes. Patient's presentation is most consistent with acute presentation with potential threat to life or bodily function.  Patient presents with cough, does describe mild shortness of breath as well in the setting of a history of heart failure CAD, paroxysmal atrial fibrillation with relatively poor medication compliance.  Does not appear that he has been taking his Lasix, differential includes fluid overload, COPD exacerbation, pneumonia, upper respiratory infection, viral  Overall well-appearing and in no acute distress at this time, chest x-ray is reassuring, no evidence of pulmonary edema, BNP is elevated at 600, question mild fluid overload.  Patient's troponin is 90, his baseline appears to be around 45, he denies chest pain, will recheck this as it may be a new baseline for  him  Will treat with a DuoNeb for scattered wheezes  Repeat troponin not significantly changed, patient remains well-appearing, will start him on Lasix 20 mg daily, close follow-up with CHF clinic, patient agrees to this plan      FINAL CLINICAL IMPRESSION(S) / ED DIAGNOSES   Final diagnoses:  Acute cough  Chronic congestive heart failure, unspecified heart failure type (HCC)     Rx / DC Orders   ED Discharge Orders          Ordered    furosemide (LASIX) 20 MG tablet  Daily        07/21/23 1222             Note:  This document was prepared using Dragon voice recognition software and may include unintentional dictation errors.   Jene Every, MD 07/21/23 1302

## 2023-08-07 ENCOUNTER — Telehealth: Payer: Self-pay | Admitting: Family

## 2023-08-07 ENCOUNTER — Encounter: Payer: 59 | Admitting: Family

## 2023-08-07 NOTE — Telephone Encounter (Signed)
Patient did not show for his Heart Failure Clinic appointment on 08/07/23.

## 2023-08-09 ENCOUNTER — Inpatient Hospital Stay
Admission: EM | Admit: 2023-08-09 | Discharge: 2023-08-11 | DRG: 291 | Disposition: A | Discharge status: Home / Self Care | Payer: 59 | Attending: Obstetrics and Gynecology | Admitting: Obstetrics and Gynecology

## 2023-08-09 ENCOUNTER — Emergency Department: Payer: 59

## 2023-08-09 ENCOUNTER — Other Ambulatory Visit: Payer: Self-pay

## 2023-08-09 DIAGNOSIS — F17210 Nicotine dependence, cigarettes, uncomplicated: Secondary | ICD-10-CM | POA: Diagnosis present

## 2023-08-09 DIAGNOSIS — I130 Hypertensive heart and chronic kidney disease with heart failure and stage 1 through stage 4 chronic kidney disease, or unspecified chronic kidney disease: Principal | ICD-10-CM | POA: Diagnosis present

## 2023-08-09 DIAGNOSIS — Z95810 Presence of automatic (implantable) cardiac defibrillator: Secondary | ICD-10-CM | POA: Insufficient documentation

## 2023-08-09 DIAGNOSIS — N1831 Chronic kidney disease, stage 3a: Secondary | ICD-10-CM | POA: Diagnosis present

## 2023-08-09 DIAGNOSIS — I493 Ventricular premature depolarization: Secondary | ICD-10-CM | POA: Diagnosis present

## 2023-08-09 DIAGNOSIS — E785 Hyperlipidemia, unspecified: Secondary | ICD-10-CM | POA: Diagnosis present

## 2023-08-09 DIAGNOSIS — J9601 Acute respiratory failure with hypoxia: Secondary | ICD-10-CM | POA: Diagnosis present

## 2023-08-09 DIAGNOSIS — E1122 Type 2 diabetes mellitus with diabetic chronic kidney disease: Secondary | ICD-10-CM | POA: Diagnosis present

## 2023-08-09 DIAGNOSIS — I2510 Atherosclerotic heart disease of native coronary artery without angina pectoris: Secondary | ICD-10-CM | POA: Diagnosis present

## 2023-08-09 DIAGNOSIS — R0602 Shortness of breath: Principal | ICD-10-CM

## 2023-08-09 DIAGNOSIS — I509 Heart failure, unspecified: Secondary | ICD-10-CM | POA: Diagnosis present

## 2023-08-09 DIAGNOSIS — Z79899 Other long term (current) drug therapy: Secondary | ICD-10-CM | POA: Diagnosis not present

## 2023-08-09 DIAGNOSIS — Z888 Allergy status to other drugs, medicaments and biological substances status: Secondary | ICD-10-CM

## 2023-08-09 DIAGNOSIS — I5022 Chronic systolic (congestive) heart failure: Secondary | ICD-10-CM

## 2023-08-09 DIAGNOSIS — I5023 Acute on chronic systolic (congestive) heart failure: Secondary | ICD-10-CM | POA: Diagnosis present

## 2023-08-09 DIAGNOSIS — I428 Other cardiomyopathies: Secondary | ICD-10-CM

## 2023-08-09 DIAGNOSIS — F17200 Nicotine dependence, unspecified, uncomplicated: Secondary | ICD-10-CM | POA: Diagnosis not present

## 2023-08-09 DIAGNOSIS — J449 Chronic obstructive pulmonary disease, unspecified: Secondary | ICD-10-CM | POA: Diagnosis present

## 2023-08-09 DIAGNOSIS — I255 Ischemic cardiomyopathy: Secondary | ICD-10-CM | POA: Diagnosis present

## 2023-08-09 DIAGNOSIS — F1910 Other psychoactive substance abuse, uncomplicated: Secondary | ICD-10-CM | POA: Insufficient documentation

## 2023-08-09 DIAGNOSIS — J441 Chronic obstructive pulmonary disease with (acute) exacerbation: Secondary | ICD-10-CM | POA: Diagnosis present

## 2023-08-09 DIAGNOSIS — Z91119 Patient's noncompliance with dietary regimen due to unspecified reason: Secondary | ICD-10-CM | POA: Diagnosis not present

## 2023-08-09 DIAGNOSIS — Z1152 Encounter for screening for COVID-19: Secondary | ICD-10-CM

## 2023-08-09 DIAGNOSIS — I10 Essential (primary) hypertension: Secondary | ICD-10-CM | POA: Diagnosis present

## 2023-08-09 DIAGNOSIS — I2489 Other forms of acute ischemic heart disease: Secondary | ICD-10-CM | POA: Diagnosis present

## 2023-08-09 DIAGNOSIS — Z833 Family history of diabetes mellitus: Secondary | ICD-10-CM

## 2023-08-09 DIAGNOSIS — I480 Paroxysmal atrial fibrillation: Secondary | ICD-10-CM | POA: Diagnosis present

## 2023-08-09 DIAGNOSIS — Z7901 Long term (current) use of anticoagulants: Secondary | ICD-10-CM | POA: Diagnosis not present

## 2023-08-09 DIAGNOSIS — F1411 Cocaine abuse, in remission: Secondary | ICD-10-CM | POA: Diagnosis present

## 2023-08-09 DIAGNOSIS — E119 Type 2 diabetes mellitus without complications: Secondary | ICD-10-CM

## 2023-08-09 DIAGNOSIS — E669 Obesity, unspecified: Secondary | ICD-10-CM | POA: Diagnosis present

## 2023-08-09 DIAGNOSIS — Z7984 Long term (current) use of oral hypoglycemic drugs: Secondary | ICD-10-CM

## 2023-08-09 DIAGNOSIS — R7989 Other specified abnormal findings of blood chemistry: Secondary | ICD-10-CM | POA: Diagnosis present

## 2023-08-09 DIAGNOSIS — I5031 Acute diastolic (congestive) heart failure: Secondary | ICD-10-CM | POA: Diagnosis not present

## 2023-08-09 HISTORY — DX: Heart failure, unspecified: I50.9

## 2023-08-09 LAB — BASIC METABOLIC PANEL
Anion gap: 6 (ref 5–15)
BUN: 28 mg/dL — ABNORMAL HIGH (ref 8–23)
CO2: 27 mmol/L (ref 22–32)
Calcium: 9 mg/dL (ref 8.9–10.3)
Chloride: 108 mmol/L (ref 98–111)
Creatinine, Ser: 1.21 mg/dL (ref 0.61–1.24)
GFR, Estimated: >60 mL/min (ref >60)
Glucose, Bld: 112 mg/dL — ABNORMAL HIGH (ref 70–99)
Potassium: 3.9 mmol/L (ref 3.5–5.1)
Sodium: 141 mmol/L (ref 135–145)

## 2023-08-09 LAB — CBC WITH DIFFERENTIAL/PLATELET
Abs Immature Granulocytes: 0.02 10*3/uL (ref 0.00–0.07)
Basophils Absolute: 0 10*3/uL (ref 0.0–0.1)
Basophils Relative: 0 %
Eosinophils Absolute: 0 10*3/uL (ref 0.0–0.5)
Eosinophils Relative: 1 %
HCT: 43.8 % (ref 39.0–52.0)
Hemoglobin: 14.2 g/dL (ref 13.0–17.0)
Immature Granulocytes: 0 %
Lymphocytes Relative: 39 %
Lymphs Abs: 2.9 10*3/uL (ref 0.7–4.0)
MCH: 32 pg (ref 26.0–34.0)
MCHC: 32.4 g/dL (ref 30.0–36.0)
MCV: 98.6 fL (ref 80.0–100.0)
Monocytes Absolute: 0.8 10*3/uL (ref 0.1–1.0)
Monocytes Relative: 11 %
Neutro Abs: 3.7 10*3/uL (ref 1.7–7.7)
Neutrophils Relative %: 49 %
Platelets: 235 10*3/uL (ref 150–400)
RBC: 4.44 MIL/uL (ref 4.22–5.81)
RDW: 15.2 % (ref 11.5–15.5)
WBC: 7.4 10*3/uL (ref 4.0–10.5)
nRBC: 0 % (ref 0.0–0.2)

## 2023-08-09 LAB — RESP PANEL BY RT-PCR (RSV, FLU A&B, COVID)  RVPGX2
Influenza A by PCR: NEGATIVE
Influenza B by PCR: NEGATIVE
Resp Syncytial Virus by PCR: NEGATIVE
SARS Coronavirus 2 by RT PCR: NEGATIVE

## 2023-08-09 LAB — TROPONIN I (HIGH SENSITIVITY)
Troponin I (High Sensitivity): 125 ng/L (ref <18)
Troponin I (High Sensitivity): 155 ng/L (ref <18)
Troponin I (High Sensitivity): 159 ng/L (ref <18)

## 2023-08-09 LAB — BRAIN NATRIURETIC PEPTIDE: B Natriuretic Peptide: 884.9 pg/mL — ABNORMAL HIGH (ref 0.0–100.0)

## 2023-08-09 MED ORDER — ASPIRIN 81 MG PO CHEW
324.0000 mg | CHEWABLE_TABLET | Freq: Once | ORAL | Status: AC
  Administered 2023-08-09: 324 mg via ORAL
  Filled 2023-08-09: qty 4

## 2023-08-09 MED ORDER — ACETAMINOPHEN 325 MG PO TABS
650.0000 mg | ORAL_TABLET | Freq: Four times a day (QID) | ORAL | Status: DC | PRN
  Filled 2023-08-09: qty 2

## 2023-08-09 MED ORDER — ALBUTEROL SULFATE HFA 108 (90 BASE) MCG/ACT IN AERS
2.0000 | INHALATION_SPRAY | Freq: Four times a day (QID) | RESPIRATORY_TRACT | Status: DC | PRN
  Filled 2023-08-09: qty 6.7

## 2023-08-09 MED ORDER — FUROSEMIDE 10 MG/ML IJ SOLN
40.0000 mg | Freq: Two times a day (BID) | INTRAMUSCULAR | Status: DC
  Filled 2023-08-09: qty 4

## 2023-08-09 MED ORDER — GUAIFENESIN-DM 100-10 MG/5ML PO SYRP
5.0000 mL | ORAL_SOLUTION | ORAL | Status: DC | PRN
  Administered 2023-08-10 – 2023-08-11 (×2): 5 mL via ORAL
  Filled 2023-08-09 (×2): qty 10

## 2023-08-09 MED ORDER — ATORVASTATIN CALCIUM 20 MG PO TABS
80.0000 mg | ORAL_TABLET | Freq: Every day | ORAL | Status: DC
  Administered 2023-08-10 – 2023-08-11 (×2): 80 mg via ORAL
  Filled 2023-08-09: qty 4

## 2023-08-09 MED ORDER — FUROSEMIDE 10 MG/ML IJ SOLN
60.0000 mg | Freq: Once | INTRAMUSCULAR | Status: AC
  Administered 2023-08-09: 60 mg via INTRAVENOUS
  Filled 2023-08-09: qty 8

## 2023-08-09 MED ORDER — NICOTINE 14 MG/24HR TD PT24
14.0000 mg | MEDICATED_PATCH | Freq: Every day | TRANSDERMAL | Status: DC
  Administered 2023-08-09 – 2023-08-11 (×2): 14 mg via TRANSDERMAL
  Filled 2023-08-09 (×3): qty 1

## 2023-08-09 MED ORDER — BENZONATATE 100 MG PO CAPS
100.0000 mg | ORAL_CAPSULE | Freq: Once | ORAL | Status: AC
  Administered 2023-08-09: 100 mg via ORAL
  Filled 2023-08-09: qty 1

## 2023-08-09 MED ORDER — INSULIN ASPART 100 UNIT/ML IJ SOLN
0.0000 [IU] | Freq: Every day | INTRAMUSCULAR | Status: DC
  Administered 2023-08-10: 2 [IU] via SUBCUTANEOUS
  Filled 2023-08-09: qty 1

## 2023-08-09 MED ORDER — SACUBITRIL-VALSARTAN 97-103 MG PO TABS
1.0000 | ORAL_TABLET | Freq: Two times a day (BID) | ORAL | Status: DC
  Administered 2023-08-09 – 2023-08-11 (×4): 1 via ORAL
  Filled 2023-08-09 (×4): qty 1

## 2023-08-09 MED ORDER — APIXABAN 5 MG PO TABS
5.0000 mg | ORAL_TABLET | Freq: Two times a day (BID) | ORAL | Status: DC
  Administered 2023-08-09 – 2023-08-11 (×4): 5 mg via ORAL
  Filled 2023-08-09 (×4): qty 1

## 2023-08-09 MED ORDER — ONDANSETRON HCL 4 MG PO TABS
4.0000 mg | ORAL_TABLET | Freq: Four times a day (QID) | ORAL | Status: DC | PRN
  Filled 2023-08-09: qty 1

## 2023-08-09 MED ORDER — ACETAMINOPHEN 650 MG RE SUPP: 650.0000 mg | Freq: Four times a day (QID) | RECTAL | Status: DC | PRN

## 2023-08-09 MED ORDER — INSULIN ASPART 100 UNIT/ML IJ SOLN
0.0000 [IU] | Freq: Three times a day (TID) | INTRAMUSCULAR | Status: DC
  Administered 2023-08-10: 3 [IU] via SUBCUTANEOUS
  Administered 2023-08-10: 5 [IU] via SUBCUTANEOUS
  Administered 2023-08-10: 8 [IU] via SUBCUTANEOUS
  Administered 2023-08-11 (×2): 3 [IU] via SUBCUTANEOUS
  Filled 2023-08-09 (×5): qty 1

## 2023-08-09 MED ORDER — FLUTICASONE PROPIONATE 50 MCG/ACT NA SUSP
2.0000 | Freq: Every day | NASAL | Status: DC
  Administered 2023-08-11: 2 via NASAL
  Filled 2023-08-09 (×2): qty 16

## 2023-08-09 MED ORDER — NITROGLYCERIN 0.4 MG SL SUBL
0.4000 mg | SUBLINGUAL_TABLET | SUBLINGUAL | Status: DC | PRN
  Filled 2023-08-09: qty 1

## 2023-08-09 MED ORDER — CARVEDILOL 6.25 MG PO TABS
6.2500 mg | ORAL_TABLET | Freq: Two times a day (BID) | ORAL | Status: DC
  Administered 2023-08-10 – 2023-08-11 (×3): 6.25 mg via ORAL
  Filled 2023-08-09 (×3): qty 1

## 2023-08-09 MED ORDER — ONDANSETRON HCL 4 MG/2ML IJ SOLN: 4.0000 mg | Freq: Four times a day (QID) | INTRAMUSCULAR | Status: DC | PRN

## 2023-08-09 MED ORDER — DAPAGLIFLOZIN PROPANEDIOL 10 MG PO TABS
10.0000 mg | ORAL_TABLET | Freq: Every day | ORAL | Status: DC
  Administered 2023-08-10 – 2023-08-11 (×2): 10 mg via ORAL
  Filled 2023-08-09 (×2): qty 1

## 2023-08-09 MED ORDER — GUAIFENESIN ER 600 MG PO TB12
600.0000 mg | ORAL_TABLET | Freq: Two times a day (BID) | ORAL | Status: DC
  Administered 2023-08-09 – 2023-08-11 (×4): 600 mg via ORAL
  Filled 2023-08-09 (×4): qty 1

## 2023-08-09 NOTE — Assessment & Plan Note (Signed)
Possible mild acute bronchitis/versus URI Patient with cough but no wheezing Antitussives, flutter valve DuoNebs as needed Symptom control

## 2023-08-09 NOTE — Assessment & Plan Note (Signed)
Nonischemic cardiomyopathy s/p AICD IV Lasix Continue GDMT with Entresto, carvedilol Daily weights with intake and output monitoring Will get repeat echocardiogram Cardiology consult

## 2023-08-09 NOTE — Assessment & Plan Note (Signed)
-  Nicotine patch 

## 2023-08-09 NOTE — ED Notes (Signed)
EDP at bedside  

## 2023-08-09 NOTE — Assessment & Plan Note (Addendum)
-

## 2023-08-09 NOTE — Assessment & Plan Note (Signed)
Sliding scale insulin coverage 

## 2023-08-09 NOTE — ED Notes (Signed)
"  Lock failed/unable to process" error message w sunquest, will send trop with chart label.

## 2023-08-09 NOTE — ED Provider Triage Note (Signed)
Emergency Medicine Provider Triage Evaluation Note  Jorge Cruz , a 72 y.o. male  was evaluated in triage.  Pt complains of SOB x2 days. Recent 7 lb weight gain. No leg swelling. No chest pain. Reports new cough.   Review of Systems  Positive: sob Negative: Leg swelling, chest pain  Physical Exam  There were no vitals taken for this visit. Gen:   Awake, no distress   Resp:  Normal effort  MSK:   Moves extremities without difficulty  Other:    Medical Decision Making  Medically screening exam initiated at 4:05 PM.  Appropriate orders placed.  Jorge Cruz was informed that the remainder of the evaluation will be completed by another provider, this initial triage assessment does not replace that evaluation, and the importance of remaining in the ED until their evaluation is complete.     Jorge Hoehn, PA-C 08/09/23 1607

## 2023-08-09 NOTE — ED Notes (Signed)
Urinal emptied approx yellow urine output per ed physician pt ok for PO food pt spouse provided recliner for comfort

## 2023-08-09 NOTE — ED Notes (Signed)
MD Derrill Kay informed of trop of 125

## 2023-08-09 NOTE — Assessment & Plan Note (Signed)
 Continue apixaban and carvedilol.

## 2023-08-09 NOTE — ED Notes (Signed)
Pt added to ccmd

## 2023-08-09 NOTE — ED Provider Notes (Signed)
[facility] Provider Note    Event Date/Time   First MD Initiated Contact with Patient 08/09/23 1745     (approximate)   History   Shortness of Breath   HPI  Jorge Cruz is a 72 y.o. male who presents to the emergency department today because of concerns for shortness of breath and cough.  The patient symptoms have been ongoing for little over 1 week.  He weighed himself today noticed he had gained about 10 pounds over the course of a week.  Does have history of heart failure.  Has tried taking his diuretics at home.  He denies any chest pain or fevers.     Physical Exam   Triage Vital Signs: ED Triage Vitals  Encounter Vitals Group     BP 08/09/23 1608 128/70     Systolic BP Percentile --      Diastolic BP Percentile --      Pulse Rate 08/09/23 1606 88     Resp 08/09/23 1606 20     Temp 08/09/23 1606 98.9 F (37.2 C)     Temp Source 08/09/23 1606 Oral     SpO2 08/09/23 1606 96 %     Weight --      Height --      Head Circumference --      Peak Flow --      Pain Score 08/09/23 1607 0     Pain Loc --      Pain Education --      Exclude from Growth Chart --     Most recent vital signs: Vitals:   08/09/23 1606 08/09/23 1608  BP:  128/70  Pulse: 88   Resp: 20   Temp: 98.9 F (37.2 C)   SpO2: 96%    General: Awake, alert, oriented. CV:  Good peripheral perfusion. Regular rate and rhythm. Resp:  Normal effort. Lungs clear. Abd:  No distention.    ED Results / Procedures / Treatments   Labs (all labs ordered are listed, but only abnormal results are displayed) Labs Reviewed  BRAIN NATRIURETIC PEPTIDE - Abnormal; Notable for the following components:      Result Value   B Natriuretic Peptide 884.9 (*)    All other components within normal limits  BASIC METABOLIC PANEL - Abnormal; Notable for the following components:   Glucose, Bld 112 (*)    BUN 28 (*)    All other components within normal limits  TROPONIN I (HIGH SENSITIVITY)  - Abnormal; Notable for the following components:   Troponin I (High Sensitivity) 125 (*)    All other components within normal limits  RESP PANEL BY RT-PCR (RSV, FLU A&B, COVID)  [id number]  CBC WITH DIFFERENTIAL/PLATELET  TROPONIN I (HIGH SENSITIVITY)     EKG  I, Phineas Semen, attending physician, personally viewed and interpreted this EKG  EKG Time: 1617 Rate: 80 Rhythm: sinus rhythm with sinus arrhythmia Axis: normal Intervals: qtc 447 QRS: narrow ST changes: no st elevation Impression: abnormal ekg   RADIOLOGY I independently interpreted and visualized the CXR. My interpretation: No pneumonia Radiology interpretation:  IMPRESSION:  No active cardiopulmonary disease. Mild cardiomegaly.     PROCEDURES:  Critical Care performed: Yes  CRITICAL CARE Performed by: Phineas Semen   Total critical care time: 30 minutes  Critical care time was exclusive of separately billable procedures and treating other patients.  Critical care was necessary to treat or prevent imminent or life-threatening deterioration.  Critical care was time spent  personally by me on the following activities: development of treatment plan with patient and/or surrogate as well as nursing, discussions with consultants, evaluation of patient's response to treatment, examination of patient, obtaining history from patient or surrogate, ordering and performing treatments and interventions, ordering and review of laboratory studies, ordering and review of radiographic studies, pulse oximetry and re-evaluation of patient's condition.   Procedures    MEDICATIONS ORDERED IN ED: Medications  furosemide (LASIX) injection 60 mg (has no administration in time range)     IMPRESSION / MDM / ASSESSMENT AND PLAN / ED COURSE  I reviewed the triage vital signs and the nursing notes.                              Differential diagnosis includes, but is not limited to, CHF, pneumonia, PE, PTX  Patient's  presentation is most consistent with acute presentation with potential threat to life or bodily function.   The patient is on the cardiac monitor to evaluate for evidence of arrhythmia and/or significant heart rate changes.  Patient presented to the emergency department today because of concerns for coughing and shortness of breath and weight gain.  Patient has a history of heart failure.  Blood work here does show elevated BNP.  Troponin was also elevated over patient's baseline.  At this time given lack of chest pain I have low suspicion for ACS.  Think more likely secondary to CHF.  Will plan on repeating.  Additionally will give patient diuretics here and assess for clinical improvement.  Patient did start having good urine output, however the repeat troponin did increase. Patient continues to deny any chest pain. Given elevating troponin do feel patient warrants further work up and management. Discussed with Dr. Para March with the hospitalist service who will evaluate for admission.     FINAL CLINICAL IMPRESSION(S) / ED DIAGNOSES   Final diagnoses:  SOB (shortness of breath)  Elevated troponin  Congestive heart failure, unspecified HF chronicity, unspecified heart failure type [facility])      Note:  This document was prepared using Dragon voice recognition software and may include unintentional dictation errors.    Phineas Semen, MD 08/09/23 2121

## 2023-08-09 NOTE — Assessment & Plan Note (Addendum)
Elevated troponin Cardiac catheterization 06/01/17: EF of 20% along with mild nonobstructive CAD  Suspecting demand ischemia related to CHF, as patient denies chest pain and EKG nonacute Will hold off on heparin Continue to trend troponins Got as an in the ED Continue carvedilol, atorvastatin with sublingual nitroglycerin as needed

## 2023-08-09 NOTE — ED Triage Notes (Signed)
Patient states weight gain and shortness of breath; history of CHF.

## 2023-08-09 NOTE — H&P (Incomplete)
History and Physical    Patient: Jorge Cruz ZOX:[id number] DOB: 02-23-1951 DOA: 08/09/2023 DOS: the patient was seen and examined on 08/09/2023 [facility]  Patient coming from: Home  Chief Complaint:  Chief Complaint  Patient presents with   Shortness of Breath    HPI: Jorge Cruz is a 72 y.o. male with medical history significant for CAD with history of PCI, HFrEF (EF 25 to 30% 01/2023) ischemic cardiomyopathy s/p AICD, paroxysmal A-fib on Eliquis DM, COPD, nicotine dependence, HTN, Who presents to the ED with a 2-week history of nonproductive cough and 1 week history of dyspnea on exertion saw his PCP and was treated with steroids and antibiotics and an inhaler but symptoms have continued.  He has started having chest pain across his chest with coughing.  Has tried nitro for the chest pain with relief.  Additionally he has had weight gain of 10 pounds in the past week, albeit without lower extremity edema or orthopnea and states compliance with his heart meds.  He denies chest pain or lower extremity pain and denies fever or chills. ED course and data review: Vitals in the ED unremarkable Pertinent findings on workup include the following Troponin 125-155 and BNP 884.9 Respiratory viral panel negative for COVID flu and RSV CBC and CMP mostly within normal limits EKG, personally viewed and interpreted showing sinus rhythm at 80 with some PVCs no acute st-t wave changes Chest x-ray with no active cardiopulmonary disease and mild cardiomegaly.  Patient treated with IV Lasix and 324mg  chewable aspirin. Also given tessalon for cough Hospitalist consulted for admission.   Review of Systems: As mentioned in the history of present illness. All other systems reviewed and are negative.  Past Medical History:  Diagnosis Date   CHF (congestive heart failure) (HCC)    Chronic combined systolic (congestive) and diastolic (congestive) heart failure (HCC)    a. 03/2014  Echo: EF 45%, glob HK; b. 03/2016 Echo: EF 20-25%, diff HK, Gr1 DD, mild MR; b. 11/2017 Echo: EF 20%, diff HK, Gr2 DD, mildly to mod reduced RV fxn, PASP .   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 03/2014 Echo: EF 45%, glob HK, mild conc LVH; b. 03/2014 MV: possible mild ischemia, superimposed on small inf infarct, EF 36%; c. 03/2016 MV: EF <30%, no ischemia; d. 03/2016 Echo: EF 20-25%, diff HK, Gr1 DD, mild MR; e. 05/2017 Cath: nonobs dzs, EF 20%; f. 11/2017 Echo: EF 20%, diff HK, Gr2 DD.   Non-obstructive CAD (coronary artery disease)    a. 05/2017 Cath: LM nl, LAD 21m, LCX 39m, RCA 29m, EF 20%.   Paroxysmal atrial fibrillation (HCC)    Polysubstance abuse (HCC)    Tobacco abuse    Past Surgical History:  Procedure Laterality Date   RIGHT/LEFT HEART CATH AND CORONARY ANGIOGRAPHY N/A 06/01/2017   Procedure: RIGHT/LEFT HEART CATH AND CORONARY ANGIOGRAPHY;  Surgeon: Iran Ouch, MD;  Location: [facility] INVASIVE CV LAB;  Service: Cardiovascular;  Laterality: N/A;   XI ROBOTIC ASSISTED INGUINAL HERNIA REPAIR WITH MESH Right 03/03/2021   Procedure: XI ROBOTIC ASSISTED INGUINAL HERNIA REPAIR WITH MESH;  Surgeon: Carolan Shiver, MD;  Location: [facility] ORS;  Service: General;  Laterality: Right;   Social History:  reports that he has been smoking cigarettes. He has a 26 pack-year smoking history. He has never used smokeless tobacco. He reports current alcohol use of about 4.0 - 5.0 standard drinks of alcohol per week. He reports that he does not currently use drugs after having used the following drugs: Cocaine.  Allergies  Allergen Reactions   Spironolactone Other (See Comments)     Hyperkalemia    Family History  Problem Relation Age of Onset   Diabetes Mother    Diabetes Father     Prior to Admission medications   Medication Sig Start Date End Date Taking? Authorizing Provider  albuterol (PROVENTIL HFA;VENTOLIN HFA) 108 (90 Base) MCG/ACT inhaler Inhale 2 puffs into the lungs every 6 (six) hours as needed for wheezing or shortness of breath.    [provider]  allopurinol (ZYLOPRIM) 100 MG tablet Take 1 tablet by mouth daily. 11/17/20   [provider]  apixaban (ELIQUIS) 5 MG TABS tablet Take 5 mg by mouth 2 (two) times daily. 01/20/21   [provider]  atorvastatin (LIPITOR) 80 MG tablet Take 80 mg by mouth daily.    [provider]  carvedilol (COREG) 6.25 MG tablet Take 1 tablet (6.25 mg total) by mouth 2 (two) times daily. 01/17/23   Delma Freeze, FNP  clotrimazole (CLOTRIMAZOLE ANTI-FUNGAL) 1 % cream Apply 1 Application topically 2 (two) times daily. Apply to the foreskin and head of the penis 07/23/22   Pilar Jarvis, MD  dapagliflozin propanediol (FARXIGA) 10 MG TABS tablet Take 1 tablet (10 mg total) by mouth daily before breakfast. NEEDS FOLLOW UP APPOINTMENT FOR MORE REFILLS 07/17/23   Clarisa Kindred A, FNP  fluticasone (FLONASE) 50 MCG/ACT nasal spray Place 2 sprays into both nostrils daily.    [provider]  furosemide (LASIX) 20 MG tablet Take 1 tablet (20 mg total) by mouth daily. 07/21/23 07/20/24  Jene Every, MD  guaiFENesin-dextromethorphan (ROBITUSSIN DM) 100-10 MG/5ML syrup Take 5 mLs by mouth every 4 (four) hours as needed for cough. 07/23/22   Pilar Jarvis, MD  magnesium oxide (MAG-OX) 400 MG tablet Take 1 tablet by mouth daily. Patient not taking: Reported on 01/17/2023 02/18/22   [provider]  nitroGLYCERIN (NITROSTAT) 0.4 MG SL tablet DISSOLVE 1 TABLET UNDER THE  TONGUE EVERY 5 MINUTES AS NEEDED FOR CHEST PAIN. MAX OF 3 TABLETS IN 15 MINUTES. CALL 911 IF PAIN  PERSISTS. 02/16/23   Dunn, Raymon Mutton, PA-C  sacubitril-valsartan (ENTRESTO) 97-103 MG Take 1 tablet by mouth 2 (two) times daily. 09/29/21   Delma Freeze, FNP    Physical Exam: Vitals:   08/09/23 1900 08/09/23 1921 08/09/23 2015 08/09/23 2018  BP:  125/65    Pulse: 97  77   Resp:  16    Temp:    97.9 F (36.6 C)  TempSrc:    Oral  SpO2: 96%  96%    Physical Exam Vitals and nursing note reviewed.  Constitutional:      Comments: Patient coughing continuously  HENT:     Head: Normocephalic and atraumatic.  Cardiovascular:     Rate and Rhythm: Normal rate and regular rhythm.     Heart sounds: Normal heart sounds.  Pulmonary:     Effort: Pulmonary effort is normal.     Breath sounds: Normal breath sounds.  Abdominal:     Palpations: Abdomen is soft.     Tenderness: There is no abdominal tenderness.  Musculoskeletal:  Right lower leg: No edema.     Left lower leg: No edema.  Neurological:     Mental Status: Mental status is at baseline.     Labs on Admission: I have personally reviewed following labs and imaging studies  CBC: Recent Labs  Lab 08/09/23 1609  WBC 7.4  NEUTROABS 3.7  HGB 14.2  HCT 43.8  MCV 98.6  PLT 235   Basic Metabolic Panel: Recent Labs  Lab 08/09/23 1609  NA 141  K 3.9  CL 108  CO2 27  GLUCOSE 112*  BUN 28*  CREATININE 1.21  CALCIUM 9.0   GFR: CrCl cannot be calculated (Unknown ideal weight.). Liver Function Tests: No results for input(s): "AST", "ALT", "ALKPHOS", "BILITOT", "PROT", "ALBUMIN" in the last 168 hours. No results for input(s): "LIPASE", "AMYLASE" in the last 168 hours. No results for input(s): "AMMONIA" in the last 168 hours. Coagulation Profile: No results for input(s): "INR", "PROTIME" in the last 168 hours. Cardiac Enzymes: No results for input(s): "CKTOTAL", "CKMB", "CKMBINDEX", "TROPONINI" in the last 168 hours. BNP (last 3 results) No results for input(s): "PROBNP" in the last 8760 hours. HbA1C: No results for input(s): "HGBA1C" in the  last 72 hours. CBG: No results for input(s): "GLUCAP" in the last 168 hours. Lipid Profile: No results for input(s): "CHOL", "HDL", "LDLCALC", "TRIG", "CHOLHDL", "LDLDIRECT" in the last 72 hours. Thyroid Function Tests: No results for input(s): "TSH", "T4TOTAL", "FREET4", "T3FREE", "THYROIDAB" in the last 72 hours. Anemia Panel: No results for input(s): "VITAMINB12", "FOLATE", "FERRITIN", "TIBC", "IRON", "RETICCTPCT" in the last 72 hours. Urine analysis:    Component Value Date/Time   COLORURINE YELLOW (A) 07/23/2022 1159   APPEARANCEUR CLEAR (A) 07/23/2022 1159   LABSPEC 1.016 07/23/2022 1159   PHURINE 5.0 07/23/2022 1159   GLUCOSEU >=500 (A) 07/23/2022 1159   HGBUR SMALL (A) 07/23/2022 1159   BILIRUBINUR NEGATIVE 07/23/2022 1159   KETONESUR NEGATIVE 07/23/2022 1159   PROTEINUR 100 (A) 07/23/2022 1159   NITRITE NEGATIVE 07/23/2022 1159   LEUKOCYTESUR NEGATIVE 07/23/2022 1159    Radiological Exams on Admission: DG Chest 2 View Result Date: 08/09/2023 CLINICAL DATA:  Shortness of breath EXAM: CHEST - 2 VIEW COMPARISON:  07/21/2023 FINDINGS: Left-sided pacing device. Mild cardiomegaly. No focal opacity, significant effusion, or pneumothorax IMPRESSION: No active cardiopulmonary disease. Mild cardiomegaly. Electronically Signed   By: Jasmine Pang M.D.   On: 08/09/2023 17:46     Data Reviewed: Relevant notes from primary care and specialist visits, past discharge summaries as available in EHR, including Care Everywhere. Prior diagnostic testing as pertinent to current admission diagnoses Updated medications and problem lists for reconciliation ED course, including vitals, labs, imaging, treatment and response to treatment Triage notes, nursing and pharmacy notes and ED provider's notes Notable results as noted in HPI   Assessment and Plan: * Acute on chronic heart failure with reduced ejection fraction (HFrEF, EF 25-30% 01/2023%) (HCC) Nonischemic cardiomyopathy s/p AICD IV  Lasix Continue GDMT with Entresto, carvedilol Daily weights with intake and output monitoring Will get repeat echocardiogram Cardiology consult  Coronary artery disease Chest pain with Elevated troponin Chest pain believed related to cough  Troponin elevated and suspecting demand ischemia related to CHF,, EKG nonacute Will hold off on heparin Continue to trend troponins--> started downtrending Continue carvedilol, atorvastatin with sublingual nitroglycerin as needed Cardiac catheterization 06/01/17:  mild nonobstructive CAD  Cardiac Panel (last 3 results) Recent Labs    08/09/23 1756 08/09/23 2305 08/10/23 0147  TROPONINIHS 155* 159* 148*  Paroxysmal atrial fibrillation (HCC) Continue apixaban and carvedilol  COPD with acute exacerbation (HCC) Possible mild acute bronchitis/versus URI Patient with continuous cough and mild wheezing Scheduled and as needed DuoNebs IV steroids Antitussives--> Tussionex, flutter valve  Essential hypertension Controlled.  Continue home meds  Tobacco use disorder Nicotine patch   Diabetes (HCC) Sliding scale insulin coverage        DVT prophylaxis: Apixaban  Consults: cardiology chmg  Advance Care Planning:   Code Status: Prior   Family Communication: Wife at bedside  Disposition Plan: Back to previous home environment  Severity of Illness: The appropriate patient status for this patient is INPATIENT. Inpatient status is judged to be reasonable and necessary in order to provide the required intensity of service to ensure the patient's safety. The patient's presenting symptoms, physical exam findings, and initial radiographic and laboratory data in the context of their chronic comorbidities is felt to place them at high risk for further clinical deterioration. Furthermore, it is not anticipated that the patient will be medically stable for discharge from the hospital within 2 midnights of admission.   * I certify that at  the point of admission it is my clinical judgment that the patient will require inpatient hospital care spanning beyond 2 midnights from the point of admission due to high intensity of service, high risk for further deterioration and high frequency of surveillance required.* CRITICAL CARE Performed by: Andris Baumann   Total critical care time: 70 minutes  Critical care time was exclusive of separately billable procedures and treating other patients.  Critical care was necessary to treat or prevent imminent or life-threatening deterioration.  Critical care was time spent personally by me on the following activities: development of treatment plan with patient and/or surrogate as well as nursing, discussions with consultants, evaluation of patient's response to treatment, examination of patient, obtaining history from patient or surrogate, ordering and performing treatments and interventions, ordering and review of laboratory studies, ordering and review of radiographic studies, pulse oximetry and re-evaluation of patient's condition.  Author: Andris Baumann, MD 08/09/2023 9:43 PM  For on call review [email or web address].

## 2023-08-10 DIAGNOSIS — F17200 Nicotine dependence, unspecified, uncomplicated: Secondary | ICD-10-CM

## 2023-08-10 DIAGNOSIS — J441 Chronic obstructive pulmonary disease with (acute) exacerbation: Secondary | ICD-10-CM

## 2023-08-10 DIAGNOSIS — I509 Heart failure, unspecified: Secondary | ICD-10-CM

## 2023-08-10 DIAGNOSIS — I2510 Atherosclerotic heart disease of native coronary artery without angina pectoris: Secondary | ICD-10-CM

## 2023-08-10 DIAGNOSIS — I480 Paroxysmal atrial fibrillation: Secondary | ICD-10-CM

## 2023-08-10 DIAGNOSIS — I5023 Acute on chronic systolic (congestive) heart failure: Secondary | ICD-10-CM

## 2023-08-10 DIAGNOSIS — R0602 Shortness of breath: Principal | ICD-10-CM

## 2023-08-10 DIAGNOSIS — R7989 Other specified abnormal findings of blood chemistry: Secondary | ICD-10-CM | POA: Diagnosis not present

## 2023-08-10 DIAGNOSIS — I5031 Acute diastolic (congestive) heart failure: Secondary | ICD-10-CM

## 2023-08-10 DIAGNOSIS — I428 Other cardiomyopathies: Secondary | ICD-10-CM

## 2023-08-10 DIAGNOSIS — F1910 Other psychoactive substance abuse, uncomplicated: Secondary | ICD-10-CM

## 2023-08-10 DIAGNOSIS — I10 Essential (primary) hypertension: Secondary | ICD-10-CM

## 2023-08-10 DIAGNOSIS — Z95810 Presence of automatic (implantable) cardiac defibrillator: Secondary | ICD-10-CM

## 2023-08-10 DIAGNOSIS — N1831 Chronic kidney disease, stage 3a: Secondary | ICD-10-CM

## 2023-08-10 LAB — CBG MONITORING, ED
Glucose-Capillary: 180 mg/dL — ABNORMAL HIGH (ref 70–99)
Glucose-Capillary: 242 mg/dL — ABNORMAL HIGH (ref 70–99)
Glucose-Capillary: 294 mg/dL — ABNORMAL HIGH (ref 70–99)
Glucose-Capillary: 217 mg/dL — ABNORMAL HIGH (ref 70–99)

## 2023-08-10 LAB — BASIC METABOLIC PANEL
Anion gap: 11 (ref 5–15)
BUN: 32 mg/dL — ABNORMAL HIGH (ref 8–23)
CO2: 22 mmol/L (ref 22–32)
Calcium: 8.9 mg/dL (ref 8.9–10.3)
Chloride: 105 mmol/L (ref 98–111)
Creatinine, Ser: 1.45 mg/dL — ABNORMAL HIGH (ref 0.61–1.24)
GFR, Estimated: 51 mL/min — ABNORMAL LOW (ref >60)
Glucose, Bld: 148 mg/dL — ABNORMAL HIGH (ref 70–99)
Potassium: 4.6 mmol/L (ref 3.5–5.1)
Sodium: 138 mmol/L (ref 135–145)

## 2023-08-10 LAB — CBC
HCT: 48 % (ref 39.0–52.0)
Hemoglobin: 15.4 g/dL (ref 13.0–17.0)
MCH: 31.7 pg (ref 26.0–34.0)
MCHC: 32.1 g/dL (ref 30.0–36.0)
MCV: 98.8 fL (ref 80.0–100.0)
Platelets: 227 10*3/uL (ref 150–400)
RBC: 4.86 MIL/uL (ref 4.22–5.81)
RDW: 15.3 % (ref 11.5–15.5)
WBC: 5.8 10*3/uL (ref 4.0–10.5)
nRBC: 0 % (ref 0.0–0.2)

## 2023-08-10 LAB — RESPIRATORY PANEL BY PCR

## 2023-08-10 LAB — TROPONIN I (HIGH SENSITIVITY): Troponin I (High Sensitivity): 148 ng/L (ref <18)

## 2023-08-10 LAB — HEMOGLOBIN A1C
Hgb A1c MFr Bld: 7.2 % — ABNORMAL HIGH (ref 4.8–5.6)
Mean Plasma Glucose: 159.94 mg/dL

## 2023-08-10 LAB — URINE DRUG SCREEN, QUALITATIVE (ARMC ONLY)
Amphetamines, Ur Screen: NOT DETECTED
Barbiturates, Ur Screen: NOT DETECTED
Benzodiazepine, Ur Scrn: NOT DETECTED
Cannabinoid 50 Ng, Ur ~~LOC~~: NOT DETECTED
Cocaine Metabolite,Ur ~~LOC~~: NOT DETECTED
MDMA (Ecstasy)Ur Screen: NOT DETECTED
Methadone Scn, Ur: NOT DETECTED
Opiate, Ur Screen: NOT DETECTED
Phencyclidine (PCP) Ur S: NOT DETECTED
Tricyclic, Ur Screen: NOT DETECTED

## 2023-08-10 MED ORDER — ALBUTEROL SULFATE (2.5 MG/3ML) 0.083% IN NEBU
2.5000 mg | INHALATION_SOLUTION | Freq: Four times a day (QID) | RESPIRATORY_TRACT | Status: DC | PRN
  Administered 2023-08-10: 2.5 mg via RESPIRATORY_TRACT
  Filled 2023-08-10 (×2): qty 3

## 2023-08-10 MED ORDER — HYDROCOD POLI-CHLORPHE POLI ER 10-8 MG/5ML PO SUER
5.0000 mL | Freq: Two times a day (BID) | ORAL | Status: DC
  Administered 2023-08-10 – 2023-08-11 (×4): 5 mL via ORAL
  Filled 2023-08-10 (×4): qty 5

## 2023-08-10 MED ORDER — PREDNISONE 20 MG PO TABS
40.0000 mg | ORAL_TABLET | Freq: Every day | ORAL | Status: DC
  Administered 2023-08-11: 40 mg via ORAL
  Filled 2023-08-10: qty 2

## 2023-08-10 MED ORDER — METHYLPREDNISOLONE SODIUM SUCC 125 MG IJ SOLR
125.0000 mg | Freq: Once | INTRAMUSCULAR | Status: AC
  Administered 2023-08-10: 125 mg via INTRAVENOUS
  Filled 2023-08-10: qty 2

## 2023-08-10 MED ORDER — METHYLPREDNISOLONE SODIUM SUCC 40 MG IJ SOLR: 40.0000 mg | Freq: Two times a day (BID) | INTRAMUSCULAR | Status: DC

## 2023-08-10 MED ORDER — FUROSEMIDE 10 MG/ML IJ SOLN
40.0000 mg | Freq: Two times a day (BID) | INTRAMUSCULAR | Status: DC
  Administered 2023-08-10: 40 mg via INTRAVENOUS
  Filled 2023-08-10: qty 4

## 2023-08-10 MED ORDER — ALLOPURINOL 100 MG PO TABS
100.0000 mg | ORAL_TABLET | Freq: Every day | ORAL | Status: DC
  Administered 2023-08-10 – 2023-08-11 (×2): 100 mg via ORAL
  Filled 2023-08-10 (×2): qty 1

## 2023-08-10 MED ORDER — IPRATROPIUM-ALBUTEROL 0.5-2.5 (3) MG/3ML IN SOLN
3.0000 mL | Freq: Four times a day (QID) | RESPIRATORY_TRACT | Status: DC
  Administered 2023-08-10 – 2023-08-11 (×6): 3 mL via RESPIRATORY_TRACT
  Filled 2023-08-10 (×5): qty 3

## 2023-08-10 NOTE — ED Notes (Signed)
Pt denies CP @ this time.

## 2023-08-10 NOTE — ED Notes (Signed)
Unknown reason for incomplete head to toe assessment due 3 hours prior to Presenter, broadcasting arrival by initial receiving RN

## 2023-08-10 NOTE — ED Notes (Signed)
Head to toe assessment due 3.15 hr prior to RN arrival unknown reason for incomplete head to toe assessment from primary RN assigned to pt [name]

## 2023-08-10 NOTE — Progress Notes (Addendum)
Heart Failure Stewardship Pharmacy Note  PCP: [facility] PCP-Cardiologist: Yvonne Kendall, MD  HPI: Jorge Cruz is a 72 y.o. male with CAD with history of PCI, history of cocaine use, HFrEF (EF 25 to 30% 01/2023) ischemic cardiomyopathy s/p AICD, paroxysmal A-fib on Eliquis DM, COPD, nicotine dependence, HTN who presented with 2 week history of non-productive cough, progressing a week later to dyspnea on exertion and a weight gain of 10 lbs. Treated COPD with steroids and antibiotics outpatient. High salt intake with fast food. BNP on admission is 637.6. HS-troponin on admission is 92. No pulmonary edema noted on CXR.   Pertinent cardiac history: Stress test in 03/2016 with LVEF of <30%, severe LV dilation, and no perfusion defects. Echo at the same time with LVEF of 20-25%, grade I diastolic dysfunction, mild MR. LHC/RHC in 05/2017 showed mild nonobstructive CAD, cardiac index of 1.83 and PCWP of 5. LVEF unchanged in 11/2017. Echo in 09/2017 with LVEF improved to 30-35% and RV low-normal. ICD implanted in 2021. Similar in 07/2021. Echo in 01/2023 showed LVEF down to 25-30% with low-normal RV function.  Pertinent Lab Values: Creatinine, Ser  Date Value Ref Range Status  08/10/2023 1.45 (H) 0.61 - 1.24 mg/dL Final   BUN  Date Value Ref Range Status  08/10/2023 32 (H) 8 - 23 mg/dL Final  96/11/5407 23 8 - 27 mg/dL Final   Potassium  Date Value Ref Range Status  08/10/2023 4.6 3.5 - 5.1 mmol/L Final    Comment:    HEMOLYSIS AT THIS LEVEL MAY AFFECT RESULT   Sodium  Date Value Ref Range Status  08/10/2023 138 135 - 145 mmol/L Final  10/11/2021 140 134 - 144 mmol/L Final   B Natriuretic Peptide  Date Value Ref Range Status  08/09/2023 884.9 (H) 0.0 - 100.0 pg/mL Final    Comment:    Performed at [facility], [address] [place], [place] [address]   Magnesium  Date Value Ref Range Status  10/13/2019 2.2 1.7 - 2.4 mg/dL Final    Comment:     Performed at [facility], [address] [place], [place] [address]   Hgb A1c MFr Bld  Date Value Ref Range Status  08/09/2023 7.2 (H) 4.8 - 5.6 % Final    Comment:    (NOTE) Pre diabetes:          5.7%-6.4%  Diabetes:              >6.4%  Glycemic control for   <7.0% adults with diabetes    TSH  Date Value Ref Range Status  11/24/2017 0.940 0.350 - 4.500 uIU/mL Final    Comment:    Performed by a 3rd Generation assay with a functional sensitivity of <=0.01 uIU/mL. Performed at [facility], [address] [place], [place] [address]     Vital Signs: Admission weight: none Temp:  [97.9 F (36.6 C)-98.9 F (37.2 C)] 97.9 F (36.6 C) (12/19 0125) Pulse Rate:  [70-97] 73 (12/19 0230) Cardiac Rhythm: Normal sinus rhythm (12/18 2147) Resp:  [13-24] 18 (12/19 0230) BP: (116-152)/(64-91) 122/69 (12/19 0230) SpO2:  [88 %-100 %] 88 % (12/19 0230)  Intake/Output Summary (Last 24 hours) at 08/10/2023 0642 Last data filed at 08/09/2023 1826 Gross per 24 hour  Intake --  Output 100 ml  Net -100 ml   Current Heart Failure Medications:  Loop diuretic: none Beta-Blocker: carvedilol 6.25 mg BID ACEI/ARB/ARNI: Entresto 97/103 mg BID MRA: none SGLT2i: Farxiga 10 mg  daily Other:  Prior to admission Heart Failure Medications:  Loop diuretic: furosemide 20 mg daily Beta-Blocker: carvedilol 6.25 mg BID ACEI/ARB/ARNI: Entresto 97/103 mg BID MRA: none SGLT2i: Farxiga 10 mg daily Other:  Assessment: 1. Acute on chronic systolic heart failure (LVEF 01/2023 25-30%) with low-normal RV function, due to NICM. NYHA class II-III symptoms.  -Symptoms: Reports mild improvement in symptoms. Reports only mild orthopnea. Denies LEE. Reports productive cough. -Volume: Appears euvolemic to mildly volume up. CXR negative for edema, but reported a 10 lb weight gain PTA with a elevated BNP. Urine output this far not documented. Received furosemide 60 mg IV yesterday and 40 mg  IV today. Agree with holding further doses today. -Hemodynamics: BP remains elevated with HR in 90s.  -BB: Has had a 9 beat run of NSVT. HR is in 90s. Appears relatively euvolemic with no signs of low output. Would benefit from increasing carvedilol to 12.5 mg BID. -ACEI/ARB/ARNI: Continue home Entresto 97/103 mg BID. -MRA: None. Has had potassium on the higher end in recent history. Excellent candidate for finerenone outpatient. -SGLT2i: Continue Farxiga 10 mg daily  Plan: 1) Medication changes recommended at this time: -Can consider increasing carvedilol to 12.5 mg BID given reported NSVT on telemetry and no signs of low output.  -If there is concern nonselective beta blocker is contributing to COPD exacerbation, can consider metoprolol or bisoprolol.   2) Patient assistance: -None  3) Education: - Patient has been educated on current HF medications and potential additions to HF medication regimen - Patient verbalizes understanding that over the next few months, these medication doses may change and more medications may be added to optimize HF regimen - Patient has been educated on basic disease state pathophysiology and goals of therapy  Medication Assistance / Insurance Benefits Check: Does the patient have prescription insurance?    Type of insurance plan:  Does the patient qualify for medication assistance through manufacturers or grants? No   Outpatient Pharmacy: Prior to admission outpatient pharmacy: [facility]     Please do not hesitate to reach out with questions or concerns,  Enos Fling, PharmD, CPP, BCPS Heart Failure Pharmacist  Phone - [phone] 08/10/2023 10:53 AM

## 2023-08-10 NOTE — Consult Note (Signed)
Cardiology Consultation:   Patient ID: Jorge Cruz; [id number]; Aug 25, 1950   Admit date: 08/09/2023 Date of Consult: 08/10/2023  Primary Care Provider: [facility] Primary Cardiologist: [facility] Primary Electrophysiologist:  [facility]   Patient Profile:   Jorge Cruz is a 72 y.o. male with a hx of nonobstructive CAD, HFrEF status post ICD in 12/2019 monitored through [facility] PAF, DM2, CKD stage II-III, HTN, COPD, and polysubstance use who is being seen today for the evaluation of acute on chronic HFrEF at the request of Dr. Para March.  History of Present Illness:   Jorge Cruz underwent remote treadmill MPI in 03/2016 that showed no evidence of perfusion defects with an EF of less than 30% and was overall an intermediate risk study. Blood pressure demonstrated a hypertensive response to exercise. Echo at that time demonstrated an EF of 20 to 25%, grade 1 diastolic dysfunction, mild concentric LVH, and mild mitral regurgitation. Subsequent R/LHC in 05/2017 showed mild nonobstructive CAD as outlined below with an EF of 20%. RHC showed normal filling pressures, normal pulmonary pressure, and moderately reduced cardiac output. Over the years, he has had a persistent cardiomyopathy with echo from 07/2021 demonstrating an EF of 30 to 35%, global hypokinesis, mildly dilated LV internal cavity size, grade 1 diastolic dysfunction, mildly reduced RV systolic function with mildly enlarged ventricular cavity size, no significant valvular abnormalities, and an estimated right atrial pressure of 3 mmHg. He underwent ICD implantation at [facility] in 2021. He has intermittently been followed by both [facility] cardiology, [facility] cardiology, [facility], and the [facility] more recently. Most recent echo from 01/2023 showed an EF of 25-30%, global hypokinesis, mild LVH, low normal RVSF with normal ventricular cavity size, and an estimated right atrial pressure of 3 mmHg.   He was seen in the  ED in late 06/2023 with cough. BNP 637 with troponin peaking at 92 at that time. CXR without acute cardiopulmonary process. He was given DuoNeb and advised to take Lasix 20 mg daily.   He did not show for his heart failure follow up appointment on 08/07/2023.   He was admitted to [facility] on 08/09/2023 with a 3 week history of SOB, dry cough, and abdominal swelling. No chest pain, palpitations, dizziness, presyncope, or syncope. Typically notes abdominal swelling when he is volume up. Reports a 10 pound weight gain over the past 3 weeks. Was recently treated at [facility] less than a week ago with steroids for COPD exacerbation. Eats out at restaurants most mornings. Will sometimes eat out at restaurants for all meals, typically [place] food. Has not been taking Lasix daily at home. Continues to smoke tobacco. Reports he took 2 Lasix 20 mg tabs right before coming to the ED. CXR showed mild cardiomegaly with no active cardiopulmonary process. Labs: high sensitivity troponin 125 with a delta of 155, peaking at 159, down trending to 148. BNP 884. Covid, influenza, and RSV negative. NFA21-30, SCr 1.21-1.45. CBC unremarkable. In the ED he received ASA 324 mg x 1, albuterol, solu-medrol, and IV Lasix 60 mg.     Past Medical History:  Diagnosis Date   CHF (congestive heart failure) (HCC)    Chronic combined systolic (congestive) and diastolic (congestive) heart failure (HCC)    a. 03/2014 Echo: EF 45%, glob HK; b. 03/2016 Echo: EF 20-25%, diff HK, Gr1 DD, mild MR; b. 11/2017 Echo: EF 20%, diff HK, Gr2 DD, mildly to mod reduced RV fxn, PASP .   CKD (chronic kidney disease), stage  III (HCC)    CKD (chronic kidney disease), stage III (HCC)    COPD (chronic obstructive pulmonary disease) (HCC)    Diabetes mellitus without complication (HCC)    Hypertensive heart disease    ICD (implantable cardioverter-defibrillator) in place 01/02/2020   NICM (nonischemic cardiomyopathy) (HCC)    a. 03/2014 Echo:  EF 45%, glob HK, mild conc LVH; b. 03/2014 MV: possible mild ischemia, superimposed on small inf infarct, EF 36%; c. 03/2016 MV: EF <30%, no ischemia; d. 03/2016 Echo: EF 20-25%, diff HK, Gr1 DD, mild MR; e. 05/2017 Cath: nonobs dzs, EF 20%; f. 11/2017 Echo: EF 20%, diff HK, Gr2 DD.   Non-obstructive CAD (coronary artery disease)    a. 05/2017 Cath: LM nl, LAD 2m, LCX 5m, RCA 55m, EF 20%.   Paroxysmal atrial fibrillation (HCC)    Polysubstance abuse (HCC)    Tobacco abuse     Past Surgical History:  Procedure Laterality Date   RIGHT/LEFT HEART CATH AND CORONARY ANGIOGRAPHY N/A 06/01/2017   Procedure: RIGHT/LEFT HEART CATH AND CORONARY ANGIOGRAPHY;  Surgeon: Iran Ouch, MD;  Location: [facility] INVASIVE CV LAB;  Service: Cardiovascular;  Laterality: N/A;   XI ROBOTIC ASSISTED INGUINAL HERNIA REPAIR WITH MESH Right 03/03/2021   Procedure: XI ROBOTIC ASSISTED INGUINAL HERNIA REPAIR WITH MESH;  Surgeon: Carolan Shiver, MD;  Location: [facility] ORS;  Service: General;  Laterality: Right;     Home Meds: Prior to Admission medications   Medication Sig Start Date End Date Taking? Authorizing Provider  albuterol (PROVENTIL HFA;VENTOLIN HFA) 108 (90 Base) MCG/ACT inhaler Inhale 2 puffs into the lungs every 6 (six) hours as needed for wheezing or shortness of breath.   Yes [provider]  allopurinol (ZYLOPRIM) 100 MG tablet Take 1 tablet by mouth daily. 11/17/20  Yes [provider]  amoxicillin-clavulanate (AUGMENTIN) 875-125 MG tablet Take 1 tablet by mouth 2 (two) times daily. 08/04/23  Yes [provider]  atorvastatin (LIPITOR) 80 MG tablet Take 80 mg by mouth daily.   Yes [provider]  carvedilol (COREG) 6.25 MG tablet Take 1 tablet (6.25 mg total) by mouth 2 (two) times daily. 01/17/23  Yes Clarisa Kindred A, FNP  clotrimazole (CLOTRIMAZOLE ANTI-FUNGAL) 1 % cream Apply 1 Application topically 2 (two) times daily. Apply to the foreskin and head of the penis  07/23/22  Yes Pilar Jarvis, MD  dapagliflozin propanediol (FARXIGA) 10 MG TABS tablet Take 1 tablet (10 mg total) by mouth daily before breakfast. NEEDS FOLLOW UP APPOINTMENT FOR MORE REFILLS 07/17/23  Yes Clarisa Kindred A, FNP  fluticasone (FLONASE) 50 MCG/ACT nasal spray Place 2 sprays into both nostrils daily.   Yes [provider]  furosemide (LASIX) 20 MG tablet Take 1 tablet (20 mg total) by mouth daily. 07/21/23 07/20/24 Yes Jene Every, MD  guaiFENesin-dextromethorphan (ROBITUSSIN DM) 100-10 MG/5ML syrup Take 5 mLs by mouth every 4 (four) hours as needed for cough. 07/23/22  Yes Pilar Jarvis, MD  hydrocortisone cream 1 % Apply 1 Application topically 2 (two) times daily. 05/24/23  Yes [provider]  magnesium oxide (MAG-OX) 400 MG tablet Take 1 tablet by mouth daily. 02/18/22  Yes [provider]  metFORMIN (GLUCOPHAGE) 1000 MG tablet Take 1,000 mg by mouth 2 (two) times daily. 07/22/23  Yes [provider]  nitroGLYCERIN (NITROSTAT) 0.4 MG SL tablet DISSOLVE 1 TABLET UNDER THE  TONGUE EVERY 5 MINUTES AS NEEDED FOR CHEST PAIN. MAX OF 3 TABLETS IN 15 MINUTES. CALL 911 IF PAIN  PERSISTS. 02/16/23  Yes Eula Listen M, PA-C  predniSONE (DELTASONE) 20 MG tablet Take 40 mg by mouth daily. 08/04/23  Yes [provider]  sacubitril-valsartan (ENTRESTO) 97-103 MG Take 1 tablet by mouth 2 (two) times daily. 09/29/21  Yes Hackney, Inetta Fermo A, FNP  apixaban (ELIQUIS) 5 MG TABS tablet Take 5 mg by mouth 2 (two) times daily. Patient not taking: Reported on 08/09/2023 01/20/21   [provider]    Inpatient Medications: Scheduled Meds:  apixaban  5 mg Oral BID   atorvastatin  80 mg Oral Daily   carvedilol  6.25 mg Oral BID WC   chlorpheniramine-HYDROcodone  5 mL Oral Q12H   dapagliflozin propanediol  10 mg Oral QAC breakfast   fluticasone  2 spray Each Nare Daily   furosemide  40 mg Intravenous BID   guaiFENesin  600 mg Oral BID   insulin aspart  0-15 Units  Subcutaneous TID WC   insulin aspart  0-5 Units Subcutaneous QHS   ipratropium-albuterol  3 mL Nebulization Q6H   methylPREDNISolone (SOLU-MEDROL) injection  40 mg Intravenous Q12H   nicotine  14 mg Transdermal Daily   sacubitril-valsartan  1 tablet Oral BID   Continuous Infusions:  PRN Meds: acetaminophen **OR** acetaminophen, albuterol, guaiFENesin-dextromethorphan, nitroGLYCERIN, ondansetron **OR** ondansetron (ZOFRAN) IV  Allergies:   Allergies  Allergen Reactions   Spironolactone Other (See Comments)    Hyperkalemia    Social History:   Social History   Socioeconomic History   Marital status: Married    Spouse name: Not on file   Number of children: Not on file   Years of education: Not on file   Highest education level: Not on file  Occupational History   Not on file  Tobacco Use   Smoking status: Every Day    Current packs/day: 0.50    Average packs/day: 0.5 packs/day for 52.0 years (26.0 ttl pk-yrs)    Types: Cigarettes   Smokeless tobacco: Never  Vaping Use   Vaping status: Never Used  Substance and Sexual Activity   Alcohol use: Yes    Alcohol/week: 4.0 - 5.0 standard drinks of alcohol    Types: 1 - 2 Cans of beer, 3 Shots of liquor per week    Comment: occassional drink; past-1/2 to 1 pint liquor a week   Drug use: Not Currently    Types: Cocaine    Comment: 3 years   Sexual activity: Not Currently  Other Topics Concern   Not on file  Social History Narrative   Not on file   Social Drivers of Health   Financial Resource Strain: Not on file  Food Insecurity: Not on file  Transportation Needs: Not on file  Physical Activity: Not on file  Stress: Not on file  Social Connections: Not on file  Intimate Partner Violence: Not on file     Family History:   Family History  Problem Relation Age of Onset   Diabetes Mother    Diabetes Father     ROS:  Review of Systems  Constitutional:  Positive for malaise/fatigue. Negative for chills,  diaphoresis, fever and weight loss.  HENT:  Negative for congestion.   Eyes:  Negative for discharge and redness.  Respiratory:  Positive for cough and shortness of breath. Negative for sputum production and wheezing.   Cardiovascular:  Negative for chest pain, palpitations, orthopnea, claudication, leg swelling and PND.  Gastrointestinal:  Negative for abdominal pain, heartburn, nausea and vomiting.  Musculoskeletal:  Negative for falls and myalgias.  Skin:  Negative  for rash.  Neurological:  Negative for dizziness, tingling, tremors, sensory change, speech change, focal weakness, loss of consciousness and weakness.  Endo/Heme/Allergies:  Does not bruise/bleed easily.  Psychiatric/Behavioral:  Negative for substance abuse. The patient is not nervous/anxious.   All other systems reviewed and are negative.     Physical Exam/Data:   Vitals:   08/10/23 0215 08/10/23 0230 08/10/23 0641 08/10/23 0700  BP:  122/69 (!) 163/95 (!) 140/79  Pulse: 70 73  96  Resp: 15 18 15 16   Temp:      TempSrc:      SpO2: 100% (!) 88% 97% 94%    Intake/Output Summary (Last 24 hours) at 08/10/2023 0739 Last data filed at 08/09/2023 1826 Gross per 24 hour  Intake --  Output 100 ml  Net -100 ml   There were no vitals filed for this visit. There is no height or weight on file to calculate BMI.   Physical Exam: General: Well developed, well nourished, in no acute distress. Head: Normocephalic, atraumatic, sclera non-icteric, no xanthomas, nares without discharge.  Neck: Negative for carotid bruits. JVD not elevated. Lungs: Coarse breath sounds with rales along the right base. Breathing is unlabored. Heart: RRR with S1 S2. No murmurs, rubs, or gallops appreciated. Abdomen: Soft, non-tender, non-distended with normoactive bowel sounds. No hepatomegaly. No rebound/guarding. No obvious abdominal masses. Msk:  Strength and tone appear normal for age. Extremities: No clubbing or cyanosis. No edema. Distal  pedal pulses are 2+ and equal bilaterally. Neuro: Alert and oriented X 3. No facial asymmetry. No focal deficit. Moves all extremities spontaneously. Psych:  Responds to questions appropriately with a normal affect.   EKG:  The EKG was personally reviewed and demonstrates: NSR, 80 bpm, occasional PACs, rare PVC, possible prior anterior infarct, nonspecific st/t changes Telemetry:  Telemetry was personally reviewed and demonstrates: SR with PVCs, 9 beat run of NSVT  Weights: There were no vitals filed for this visit.  Relevant CV Studies:  2D echo 02/08/2023: 1. Left ventricular ejection fraction, by estimation, is 25 to 30%. The  left ventricle has severely decreased function. The left ventricle  demonstrates global hypokinesis. There is mild left ventricular  hypertrophy. Left ventricular diastolic parameters   are indeterminate.   2. Right ventricular systolic function is low normal. The right  ventricular size is normal.   3. The mitral valve is normal in structure. No evidence of mitral valve  regurgitation.   4. The aortic valve is grossly normal. Aortic valve regurgitation is not  visualized.   5. The inferior vena cava is normal in size with greater than 50%  respiratory variability, suggesting right atrial pressure of 3 mmHg.  __________  2D echo 08/20/2021: 1. Left ventricular ejection fraction, by estimation, is 30 to 35%. The  left ventricle has moderate to severely decreased function. The left  ventricle demonstrates global hypokinesis. The left ventricular internal  cavity size was mildly dilated. Left  ventricular diastolic parameters are consistent with Grade I diastolic  dysfunction (impaired relaxation).   2. Right ventricular systolic function is mildly reduced. The right  ventricular size is mildly enlarged.   3. The mitral valve is normal in structure. No evidence of mitral valve  regurgitation.   4. The aortic valve is tricuspid. Aortic valve regurgitation  is not  visualized.   5. The inferior vena cava is normal in size with greater than 50%  respiratory variability, suggesting right atrial pressure of 3 mmHg. __________   2D echo 10/11/2019: 1.  Left ventricular ejection fraction, by estimation, is 30 to 35%. The  left ventricle has moderately decreased function. The left ventricle  demonstrates global hypokinesis. The left ventricular internal cavity size  was moderately dilated. Left  ventricular diastolic parameters were normal.   2. Right ventricular systolic function is low normal. The right  ventricular size is mildly enlarged. There is normal pulmonary artery  systolic pressure.   3. The mitral valve is normal in structure and function. Trivial mitral  valve regurgitation.   4. The aortic valve is normal in structure and function. Aortic valve  regurgitation is trivial. __________   2D echo 11/25/2017: - Left ventricle: The cavity size was mildly dilated. There was    mild concentric hypertrophy. Systolic function was severely    reduced. The estimated ejection fraction was less than 20%.    Diffuse hypokinesis. Regional wall motion abnormalities cannot be    excluded. Features are consistent with a pseudonormal left    ventricular filling pattern, with concomitant abnormal relaxation    and increased filling pressure (grade 2 diastolic dysfunction).  - Left atrium: The atrium was normal in size.  - Right ventricle: Systolic function was mildly to moderately    reduced.  - Pulmonary arteries: Systolic pressure was mildly elevated PA peak    pressure: 35 mm Hg (S). __________   [facility] 06/01/2017: There is severe left ventricular systolic dysfunction. LV end diastolic pressure is normal. Mid RCA lesion, 30 %stenosed. Mid Cx lesion, 30 %stenosed. Mid LAD lesion, 30 %stenosed.   1. Mild nonobstructive coronary artery disease. 2. Severely reduced LV systolic function with an ejection fraction of 20%. 3. Right heart  catheterization showed normal filling pressures, normal pulmonary pressure and moderately reduced cardiac output. Cardiac output was 3.82 with cardiac index of 1.83. Pulmonary capillary wedge pressure was 5 mmHg.   Recommendations: Continue medical therapy for nonischemic cardiomyopathy. The patient does not appear to be volume overloaded. Resume small dose furosemide tomorrow. We can consider adding digoxin and spironolactone in the future but that has to be done in the outpatient setting once we make sure good compliance. __________   2D echo 04/01/2016: - Left ventricle: The cavity size was moderately dilated. There was    mild concentric hypertrophy. Systolic function was severely    reduced. The estimated ejection fraction was in the range of 20%    to 25%. Diffuse hypokinesis. Doppler parameters are consistent    with abnormal left ventricular relaxation (grade 1 diastolic    dysfunction).  - Mitral valve: There was mild regurgitation. __________   Treadmill MPI 04/01/2016: Blood pressure demonstrated a hypertensive response to exercise. There was no ST segment deviation noted during stress. No T wave inversion was noted during stress. This is an intermediate risk study. The left ventricular ejection fraction is severely decreased (<30%) with severely dilated left ventricule No evidence of perfusion defects. Possible non-ischemic cardiomyopathy.   Laboratory Data:  Chemistry Recent Labs  Lab 08/09/23 1609 08/10/23 0527  NA 141 138  K 3.9 4.6  CL 108 105  CO2 27 22  GLUCOSE 112* 148*  BUN 28* 32*  CREATININE 1.21 1.45*  CALCIUM 9.0 8.9  GFRNONAA >60 51*  ANIONGAP 6 11    No results for input(s): "PROT", "ALBUMIN", "AST", "ALT", "ALKPHOS", "BILITOT" in the last 168 hours. Hematology Recent Labs  Lab 08/09/23 1609 08/10/23 0527  WBC 7.4 5.8  RBC 4.44 4.86  HGB 14.2 15.4  HCT 43.8 48.0  MCV 98.6 98.8  MCH  32.0 31.7  MCHC 32.4 32.1  RDW 15.2 15.3  PLT 235 227    Cardiac EnzymesNo results for input(s): "TROPONINI" in the last 168 hours. No results for input(s): "TROPIPOC" in the last 168 hours.  BNP Recent Labs  Lab 08/09/23 1609  BNP 884.9*    DDimer No results for input(s): "DDIMER" in the last 168 hours.  Radiology/Studies:  DG Chest 2 View Result Date: 08/09/2023 IMPRESSION: No active cardiopulmonary disease. Mild cardiomegaly. Electronically Signed   By: Jasmine Pang M.D.   On: 08/09/2023 17:46    Assessment and Plan:   1. Acute hypoxic respiratory distress: -Suspect this is multifactorial including acute on chronic HFrEF and COPD exacerbation vs bronchitis -Does not appear grossly volume up at this time -Management of pulmonary component per IM -Weaned to room air  2. Acute on chronic HFrEF secondary to NICM s/p ICD: -Does not appear grossly volume up at this time -Reports excessive sodium intake with frequent meals at [employer] and [place] food -Took 40 mg of Lasix prior to coming to the ED, received 60 mg of IV Lasix thereafter and another 40 mg of IV IV Lasix this morning -Hold IV Lasix for now, reassess for need 12/20 -Has not been taking Lasix at home, suspect he will need daily or every other day dosed Lasix at discharge given significant sodium intake -Entresto 97/103 mg bid -Coreg 6.25 mg bid -Farxiga 10 mg -Cancel echo (recent study 01/2023), cardiomyopathy is longstanding -CHF education   3. Nonobstructive CAD with elevated high sensitivity troponin: -Never with chest pain -Mildly elevated and flat trending, likely secondary to supply demand ischemia in the setting of volume overload and underlying renal dysfunction -Eliquis in place of ASA -No plans for inpatient ischemic testing at this time  4. PAF: -Maintaining sinus rhythm with PVCs -Coreg as above -Eliquis 5 mg bid, he does not meet reduced dosing criteria  -CHADS2VASc 4 (CHF, HTN, age x 1, vascular disease)  5. CKD stage II-III: -Monitor with  diuresis   6. HTN: -Blood pressure mildly elevated in the ED this morning -Await morning meds  6. HLD: -LDL 33 in 08/2021 -Lipitor 80 mg        For questions or updates, please contact [facility] Please consult [email or web address] for contact info under Cardiology/STEMI.   Signed, Eula Listen, PA-C [facility] Pager: [phone] 08/10/2023, 7:39 AM

## 2023-08-10 NOTE — ED Notes (Signed)
Patient resting comfortably with eyes closed at this time. Respirations even and unlabored. NAD

## 2023-08-10 NOTE — Progress Notes (Signed)
PROGRESS NOTE    Jorge Cruz  WUJ:[id number] DOB: 1951/06/21 DOA: 08/09/2023 PCP: [facility], [facility]  Outpatient Specialists: cardiology    Brief Narrative:   From admission h and p Jorge Cruz is a 72 y.o. male with medical history significant for CAD with history of PCI, HFrEF (EF 25 to 30% 01/2023) ischemic cardiomyopathy s/p AICD, paroxysmal A-fib on Eliquis DM, COPD, nicotine dependence, HTN, Who presents to the ED with a 2-week history of nonproductive cough and 1 week history of dyspnea on exertion saw his PCP and was treated with steroids and antibiotics and an inhaler but symptoms have continued.  He has started having chest pain across his chest with coughing.  Has tried nitro for the chest pain with relief.  Additionally he has had weight gain of 10 pounds in the past week, albeit without lower extremity edema or orthopnea and states compliance with his heart meds.  He denies chest pain or lower extremity pain and denies fever or chills.    Assessment & Plan:   Principal Problem:   Acute on chronic heart failure with reduced ejection fraction (HFrEF, EF 25-30% 01/2023%) (HCC) Active Problems:   Coronary artery disease   Nonischemic cardiomyopathy (HCC)   COPD with acute exacerbation (HCC)   Paroxysmal atrial fibrillation (HCC)   Essential hypertension   Elevated troponin   Diabetes (HCC)   Tobacco use disorder   CKD stage 3a, GFR 45-59 ml/min (HCC)   S/P implantation of automatic cardioverter/defibrillator (AICD)   Polysubstance abuse (HCC)  # HFrEF with acute exacerbation Ef 25-30 in June of this year, has ICD. Reports weight gain and several weeks dyspnea and cough. Was treated outpatient for copd exacerbation with steroids and antibiotics (augmentin). CXR here clear with cardiomegaly. Says weight is up about 10 pounds from normal. Unlikely PE as reports compliance with anticoagulant. Treating for COPD exacerbation as below - continue diuresis with lasix  40 IV bid - cont home coreg, farxiga, entresto  # COPD  With possible exacerbation - convert solu-medrol to prednisone, will plan on taper - holding on abx - continue breathing treatments  # Acute hypoxic respiratory failure Dyspneic and o2 88 now improved to normal on room air - monitor  # T2DM A1c 7.2 - SSI - hold home metformin, continue farxiga  # CAD, non-obstructive # Tropinemia Mild trop elevation to 100s, no chest pain, no overt ischemic changes on EKG - cardiology eval pending  # History cocaine abuse Reports abstinent for several years  - UDS pending - f/u hiv, hcv, hbv, rpr as don't see any of those results in our system  # CKD 3a Kidney function is at baseline - monitor  # Obesity Noted  # A-fib Rate controlled - continue coreg and apixaban   DVT prophylaxis: apixaban Code Status: full Family Communication: none @ bedside  Level of care: Progressive Status is: Inpatient Remains inpatient appropriate because: severity of illness    Consultants:  cardiology  Procedures: none  Antimicrobials:  none    Subjective: Reports dyspnea improving, no chest pain, dry cough  Objective: Vitals:   08/10/23 0215 08/10/23 0230 08/10/23 0641 08/10/23 0700  BP:  122/69 (!) 163/95 (!) 140/79  Pulse: 70 73  96  Resp: 15 18 15 16   Temp:      TempSrc:      SpO2: 100% (!) 88% 97% 94%    Intake/Output Summary (Last 24 hours) at 08/10/2023 0835 Last data filed at 08/09/2023 1826 Gross per 24 hour  Intake --  Output 100 ml  Net -100 ml   There were no vitals filed for this visit.  Examination:  General exam: Appears calm and comfortable  Respiratory system: rales at bases otherwise clear Cardiovascular system: S1 & S2 heard, distant sounds Gastrointestinal system: Abdomen is obese, soft and nontender.   Central nervous system: Alert and oriented. No focal neurological deficits. Extremities: Symmetric 5 x 5 power. Trace LE edema Skin: No  rashes, lesions or ulcers Psychiatry: Judgement and insight appear normal. Mood & affect appropriate.     Data Reviewed: I have personally reviewed following labs and imaging studies  CBC: Recent Labs  Lab 08/09/23 1609 08/10/23 0527  WBC 7.4 5.8  NEUTROABS 3.7  --   HGB 14.2 15.4  HCT 43.8 48.0  MCV 98.6 98.8  PLT 235 227   Basic Metabolic Panel: Recent Labs  Lab 08/09/23 1609 08/10/23 0527  NA 141 138  K 3.9 4.6  CL 108 105  CO2 27 22  GLUCOSE 112* 148*  BUN 28* 32*  CREATININE 1.21 1.45*  CALCIUM 9.0 8.9   GFR: CrCl cannot be calculated (Unknown ideal weight.). Liver Function Tests: No results for input(s): "AST", "ALT", "ALKPHOS", "BILITOT", "PROT", "ALBUMIN" in the last 168 hours. No results for input(s): "LIPASE", "AMYLASE" in the last 168 hours. No results for input(s): "AMMONIA" in the last 168 hours. Coagulation Profile: No results for input(s): "INR", "PROTIME" in the last 168 hours. Cardiac Enzymes: No results for input(s): "CKTOTAL", "CKMB", "CKMBINDEX", "TROPONINI" in the last 168 hours. BNP (last 3 results) No results for input(s): "PROBNP" in the last 8760 hours. HbA1C: Recent Labs    08/09/23 2305  HGBA1C 7.2*   CBG: Recent Labs  Lab 08/10/23 0758  GLUCAP 180*   Lipid Profile: No results for input(s): "CHOL", "HDL", "LDLCALC", "TRIG", "CHOLHDL", "LDLDIRECT" in the last 72 hours. Thyroid Function Tests: No results for input(s): "TSH", "T4TOTAL", "FREET4", "T3FREE", "THYROIDAB" in the last 72 hours. Anemia Panel: No results for input(s): "VITAMINB12", "FOLATE", "FERRITIN", "TIBC", "IRON", "RETICCTPCT" in the last 72 hours. Urine analysis:    Component Value Date/Time   COLORURINE YELLOW (A) 07/23/2022 1159   APPEARANCEUR CLEAR (A) 07/23/2022 1159   LABSPEC 1.016 07/23/2022 1159   PHURINE 5.0 07/23/2022 1159   GLUCOSEU >=500 (A) 07/23/2022 1159   HGBUR SMALL (A) 07/23/2022 1159   BILIRUBINUR NEGATIVE 07/23/2022 1159   KETONESUR  NEGATIVE 07/23/2022 1159   PROTEINUR 100 (A) 07/23/2022 1159   NITRITE NEGATIVE 07/23/2022 1159   LEUKOCYTESUR NEGATIVE 07/23/2022 1159   Sepsis Labs: @LABRCNTIP (procalcitonin:4,lacticidven:4)  ) Recent Results (from the past 240 hours)  Resp panel by RT-PCR (RSV, Flu A&B, Covid) Anterior Nasal Swab     Status: None   Collection Time: 08/09/23  4:09 PM   Specimen: Anterior Nasal Swab  Result Value Ref Range Status   SARS Coronavirus 2 by RT PCR NEGATIVE NEGATIVE Final    Comment: (NOTE) SARS-CoV-2 target nucleic acids are NOT DETECTED.  The SARS-CoV-2 RNA is generally detectable in upper respiratory specimens during the acute phase of infection. The lowest concentration of SARS-CoV-2 viral copies this assay can detect is 138 copies/mL. A negative result does not preclude SARS-Cov-2 infection and should not be used as the sole basis for treatment or other patient management decisions. A negative result may occur with  improper specimen collection/handling, submission of specimen other than nasopharyngeal swab, presence of viral mutation(s) within the areas targeted by this assay, and inadequate number of viral copies(<138 copies/mL). A  negative result must be combined with clinical observations, patient history, and epidemiological information. The expected result is Negative.  Fact Sheet for Patients:  BloggerCourse.com  Fact Sheet for Healthcare Providers:  SeriousBroker.it  This test is no t yet approved or cleared by the [place] FDA and  has been authorized for detection and/or diagnosis of SARS-CoV-2 by FDA under an Emergency Use Authorization (EUA). This EUA will remain  in effect (meaning this test can be used) for the duration of the COVID-19 declaration under Section 564(b)(1) of the Act, 21 U.S.C.section 360bbb-3(b)(1), unless the authorization is terminated  or revoked sooner.       Influenza A by PCR  NEGATIVE NEGATIVE Final   Influenza B by PCR NEGATIVE NEGATIVE Final    Comment: (NOTE) The Xpert Xpress SARS-CoV-2/FLU/RSV plus assay is intended as an aid in the diagnosis of influenza from Nasopharyngeal swab specimens and should not be used as a sole basis for treatment. Nasal washings and aspirates are unacceptable for Xpert Xpress SARS-CoV-2/FLU/RSV testing.  Fact Sheet for Patients: BloggerCourse.com  Fact Sheet for Healthcare Providers: SeriousBroker.it  This test is not yet approved or cleared by the [place] FDA and has been authorized for detection and/or diagnosis of SARS-CoV-2 by FDA under an Emergency Use Authorization (EUA). This EUA will remain in effect (meaning this test can be used) for the duration of the COVID-19 declaration under Section 564(b)(1) of the Act, 21 U.S.C. section 360bbb-3(b)(1), unless the authorization is terminated or revoked.     Resp Syncytial Virus by PCR NEGATIVE NEGATIVE Final    Comment: (NOTE) Fact Sheet for Patients: BloggerCourse.com  Fact Sheet for Healthcare Providers: SeriousBroker.it  This test is not yet approved or cleared by the [place] FDA and has been authorized for detection and/or diagnosis of SARS-CoV-2 by FDA under an Emergency Use Authorization (EUA). This EUA will remain in effect (meaning this test can be used) for the duration of the COVID-19 declaration under Section 564(b)(1) of the Act, 21 U.S.C. section 360bbb-3(b)(1), unless the authorization is terminated or revoked.  Performed at [facility], [address], [place], [place] [address]          Radiology Studies: DG Chest 2 View Result Date: 08/09/2023 CLINICAL DATA:  Shortness of breath EXAM: CHEST - 2 VIEW COMPARISON:  07/21/2023 FINDINGS: Left-sided pacing device. Mild cardiomegaly. No focal opacity, significant effusion, or  pneumothorax IMPRESSION: No active cardiopulmonary disease. Mild cardiomegaly. Electronically Signed   By: Jasmine Pang M.D.   On: 08/09/2023 17:46        Scheduled Meds:  apixaban  5 mg Oral BID   atorvastatin  80 mg Oral Daily   carvedilol  6.25 mg Oral BID WC   chlorpheniramine-HYDROcodone  5 mL Oral Q12H   dapagliflozin propanediol  10 mg Oral QAC breakfast   fluticasone  2 spray Each Nare Daily   furosemide  40 mg Intravenous BID   guaiFENesin  600 mg Oral BID   insulin aspart  0-15 Units Subcutaneous TID WC   insulin aspart  0-5 Units Subcutaneous QHS   ipratropium-albuterol  3 mL Nebulization Q6H   methylPREDNISolone (SOLU-MEDROL) injection  40 mg Intravenous Q12H   nicotine  14 mg Transdermal Daily   sacubitril-valsartan  1 tablet Oral BID   Continuous Infusions:   LOS: 1 day     Silvano Bilis, MD [facility]   If 7PM-7AM, please contact night-coverage [email or web address] Password [facility] 08/10/2023, 8:35 AM

## 2023-08-10 NOTE — TOC Initial Note (Signed)
Transition of Care [facility]) - Initial/Assessment Note    Patient Details  Name: Jaelon Naidoo MRN: [id number] Date of Birth: 1951-06-03  Transition of Care [facility]) CM/SW Contact:    Marquita Palms, LCSW Phone Number: 08/10/2023, 11:10 AM  Clinical Narrative:                  CSW met with patient bedside. Patietn reports he has PCP that he sees often. Patient reports his medication comes through the mail. Patient reports he lives with his wife and she is his transportation. Patient reports he has no DME needs and is not agreeable to SNF or HH at this time. Patient reports that he is being admitted and waiting on the MD.       Patient Goals and CMS Choice            Expected Discharge Plan and Services                                              Prior Living Arrangements/Services                       Activities of Daily Living      Permission Sought/Granted                  Emotional Assessment              Admission diagnosis:  CHF (congestive heart failure) (HCC) [I50.9] Patient Active Problem List   Diagnosis Date Noted   Polysubstance abuse (HCC) 08/10/2023   S/P implantation of automatic cardioverter/defibrillator (AICD) 08/09/2023   Nonischemic cardiomyopathy (HCC) 02/24/2021   Paroxysmal atrial fibrillation (HCC) 02/24/2021   Chest pain 04/11/2020   CKD stage 3a, GFR 45-59 ml/min (HCC)    Right hip pain    Acute on chronic respiratory failure (HCC) 10/10/2019   HFrEF (heart failure with reduced ejection fraction) (HCC) 09/09/2018   Coronary artery disease 05/17/2018   Acute on chronic heart failure with reduced ejection fraction (HFrEF, EF 25-30% 01/2023%) (HCC) 06/09/2017   Tobacco use disorder 06/09/2017   NSTEMI (non-ST elevated myocardial infarction) (HCC) 05/31/2017   COPD with acute exacerbation (HCC) 04/01/2016   Essential hypertension 04/01/2016   HLD (hyperlipidemia) 04/01/2016   Atypical chest pain 03/31/2016    Elevated troponin 03/31/2016   Diabetes (HCC) 03/31/2016   PCP:  [facility] Pharmacy:   [facility] - [place], [place] - [address] [place] [address] Phone: [phone] Fax: [phone]  [facility] [place] - [place], [place] - [address] [place] [address] Phone: [phone] Fax: [phone]  [facility] [address] [place] - [address] [place] [address] Phone: [phone] Fax: [phone]     Social Drivers of Health (SDOH) Social History: SDOH Screenings   Depression (PHQ2-9): Low Risk  (06/29/2021)  Tobacco Use: High Risk (08/09/2023)   SDOH Interventions:     Readmission Risk Interventions     No data to display

## 2023-08-10 NOTE — ED Notes (Signed)
Pt noted to be off cardiac monitor Presenter, broadcasting ascom phone [phone] functioning there is not a call or notification from CCMD regarding pt cardiac monitor being off @ this time Presenter, broadcasting to bs to reapply cardiac monitor as ordered per admitting physician pt cardiac NSR occasional PVC noted pt denies CP

## 2023-08-10 NOTE — ED Notes (Signed)
Pt appears to be resting quietly in room 5 spouse @ bs no coughing noted @ this time no audible wheezing noted @ this time bilateral chest rise and fall noted pt uses urinal throughout the night without apparent difficulty

## 2023-08-10 NOTE — ED Notes (Signed)
Pt O2 noted to decrease to 87% while resting quietly 2L O2 app;ied via [place] to increase O2 to 92% pt remains aox4 denies CP

## 2023-08-10 NOTE — Progress Notes (Signed)
Heart Failure Nurse Navigator Progress Note  PCP: [facility] PCP-Cardiologist: Yvonne Kendall, MD Admission Diagnosis:  SOB ( shortness of breath) Elevated troponin Chronic congestive heart failure, unspecified heart type [facility]) Admitted from: Home  Presentation:   Jorge Cruz presented to the ED with shortness of breath and cough for 1 week. Noticed a 10 pound weight gain over a week. BNP 884.9  ECHO/ LVEF: 02/08/23 (25-30%)  Clinical Course:  Past Medical History:  Diagnosis Date   CHF (congestive heart failure) (HCC)    Chronic combined systolic (congestive) and diastolic (congestive) heart failure (HCC)    a. 03/2014 Echo: EF 45%, glob HK; b. 03/2016 Echo: EF 20-25%, diff HK, Gr1 DD, mild MR; b. 11/2017 Echo: EF 20%, diff HK, Gr2 DD, mildly to mod reduced RV fxn, PASP .   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 03/2014 Echo: EF 45%, glob HK, mild conc LVH; b. 03/2014 MV: possible mild ischemia, superimposed on small inf infarct, EF 36%; c. 03/2016 MV: EF <30%, no ischemia; d. 03/2016 Echo: EF 20-25%, diff HK, Gr1 DD, mild MR; e. 05/2017 Cath: nonobs dzs, EF 20%; f. 11/2017 Echo: EF 20%, diff HK, Gr2 DD.   Non-obstructive CAD (coronary artery disease)    a. 05/2017 Cath: LM nl, LAD 93m, LCX 71m, RCA 44m, EF 20%.   Paroxysmal atrial fibrillation (HCC)    Polysubstance abuse (HCC)    Tobacco abuse      Social History   Socioeconomic History   Marital status: Married    Spouse name: Not on file   Number of children: Not on file   Years of education: Not on file   Highest education level: Not on file  Occupational History   Not on file  Tobacco Use    Smoking status: Every Day    Current packs/day: 0.50    Average packs/day: 0.5 packs/day for 52.0 years (26.0 ttl pk-yrs)    Types: Cigarettes   Smokeless tobacco: Never  Vaping Use   Vaping status: Never Used  Substance and Sexual Activity   Alcohol use: Yes    Alcohol/week: 4.0 - 5.0 standard drinks of alcohol    Types: 1 - 2 Cans of beer, 3 Shots of liquor per week    Comment: occassional drink; past-1/2 to 1 pint liquor a week   Drug use: Not Currently    Types: Cocaine    Comment: 3 years   Sexual activity: Not Currently  Other Topics Concern   Not on file  Social History Narrative   Not on file   Social Drivers of Health   Financial Resource Strain: Low Risk  (08/10/2023)   Overall Financial Resource Strain (CARDIA)    Difficulty of Paying Living Expenses: Not very hard  Food Insecurity: Not on file  Transportation Needs: No Transportation Needs (08/10/2023)   PRAPARE - Administrator, Civil Service (Medical): No    Lack of Transportation (Non-Medical): No  Physical Activity: Not on file  Stress: Not on file  Social Connections: Not on file   Education Assessment and Provision:  Detailed education and instructions provided on heart failure disease management including the following:  Signs and symptoms  of Heart Failure When to call the physician Importance of daily weights Low sodium diet Fluid restriction Medication management Anticipated future follow-up appointments-  Patient education given on each of the above topics.  Patient acknowledges understanding via teach back method and acceptance of all instructions.  Education Materials:  "Living Better With Heart Failure" Booklet, HF zone tool, & Daily Weight Tracker Tool.  Patient has scale at home: Yes Patient has pill box at home: Mail- Pill packs  High Risk Criteria for Readmission and/or Poor Patient Outcomes: Heart failure hospital admissions (last 6 months): 2  No Show rate:  22% Difficult social situation: None Demonstrates medication adherence: Pt admits to not taking prescribed medications on a regular basis.   Primary Language: English Literacy level: Reading, Writing & Comprehension  Barriers of Care:   Diet & Fluid Restrictions Daily Weights  Medication Compliance  Considerations/Referrals:   Referral made to Heart Failure Pharmacist Stewardship: Yes Referral made to Heart Failure CSW/NCM TOC: No Referral made to Heart & Vascular TOC clinic: Yes for follow-up with Clarisa Kindred 08/24/23 @ 3:30PM  Items for Follow-up on DC/TOC: Diet & Fluid Restrictions-Reinforce a low sodium diet.  Patient is not currently following this. Daily Weights-Pt has not been consist with daily weights. Medication Compliance-Patient admits to not taking medications on a regular basis.  He does receive them in the mail prepackaged. Smoking & ETOH Cessation Continued Heart Failure Education   Roxy Horseman, RN, BSN [facility] Heart Failure Navigator Secure Chat Only

## 2023-08-11 DIAGNOSIS — I5023 Acute on chronic systolic (congestive) heart failure: Secondary | ICD-10-CM | POA: Diagnosis not present

## 2023-08-11 LAB — CBG MONITORING, ED
Glucose-Capillary: 171 mg/dL — ABNORMAL HIGH (ref 70–99)
Glucose-Capillary: 164 mg/dL — ABNORMAL HIGH (ref 70–99)

## 2023-08-11 LAB — HEPATITIS B SURFACE ANTIGEN: Hepatitis B Surface Ag: NONREACTIVE

## 2023-08-11 LAB — MAGNESIUM: Magnesium: 1.8 mg/dL (ref 1.7–2.4)

## 2023-08-11 LAB — BASIC METABOLIC PANEL
Anion gap: 9 (ref 5–15)
BUN: 43 mg/dL — ABNORMAL HIGH (ref 8–23)
CO2: 28 mmol/L (ref 22–32)
Calcium: 8.7 mg/dL — ABNORMAL LOW (ref 8.9–10.3)
Chloride: 101 mmol/L (ref 98–111)
Creatinine, Ser: 1.27 mg/dL — ABNORMAL HIGH (ref 0.61–1.24)
GFR, Estimated: >60 mL/min (ref >60)
Glucose, Bld: 167 mg/dL — ABNORMAL HIGH (ref 70–99)
Potassium: 3.9 mmol/L (ref 3.5–5.1)
Sodium: 138 mmol/L (ref 135–145)

## 2023-08-11 LAB — HIV ANTIBODY (ROUTINE TESTING W REFLEX): HIV Screen 4th Generation wRfx: NONREACTIVE

## 2023-08-11 LAB — RPR: RPR Ser Ql: NONREACTIVE

## 2023-08-11 MED ORDER — FUROSEMIDE 40 MG PO TABS
40.0000 mg | ORAL_TABLET | Freq: Every day | ORAL | Status: DC
  Administered 2023-08-11: 40 mg via ORAL
  Filled 2023-08-11: qty 1

## 2023-08-11 MED ORDER — SACUBITRIL-VALSARTAN 97-103 MG PO TABS: 1.0000 | ORAL_TABLET | Freq: Two times a day (BID) | ORAL | 3 refills | Status: DC

## 2023-08-11 MED ORDER — ATORVASTATIN CALCIUM 80 MG PO TABS: 80.0000 mg | ORAL_TABLET | Freq: Every day | ORAL | 1 refills | Status: AC

## 2023-08-11 MED ORDER — APIXABAN 5 MG PO TABS: 5.0000 mg | ORAL_TABLET | Freq: Two times a day (BID) | ORAL | 1 refills | Status: DC

## 2023-08-11 MED ORDER — CARVEDILOL 6.25 MG PO TABS: 6.2500 mg | ORAL_TABLET | Freq: Two times a day (BID) | ORAL | 3 refills | Status: DC

## 2023-08-11 MED ORDER — POTASSIUM CHLORIDE CRYS ER 20 MEQ PO TBCR
20.0000 meq | EXTENDED_RELEASE_TABLET | Freq: Every day | ORAL | Status: DC
  Administered 2023-08-11: 20 meq via ORAL
  Filled 2023-08-11: qty 1

## 2023-08-11 MED ORDER — FUROSEMIDE 40 MG PO TABS: 40.0000 mg | ORAL_TABLET | Freq: Every day | ORAL | 1 refills | Status: DC

## 2023-08-11 MED ORDER — DAPAGLIFLOZIN PROPANEDIOL 10 MG PO TABS: 10.0000 mg | ORAL_TABLET | Freq: Every day | ORAL | 2 refills | Status: DC

## 2023-08-11 NOTE — Progress Notes (Incomplete)
Heart Failure Stewardship Pharmacy Note  PCP: [facility] PCP-Cardiologist: Yvonne Kendall, MD  HPI: Jorge Cruz is a 72 y.o. male with CAD with history of PCI, history of cocaine use, HFrEF (EF 25 to 30% 01/2023) ischemic cardiomyopathy s/p AICD, paroxysmal A-fib on Eliquis DM, COPD, nicotine dependence, HTN who presented with 2 week history of non-productive cough, progressing a week later to dyspnea on exertion and a weight gain of 10 lbs. Treated COPD with steroids and antibiotics outpatient. High salt intake with fast food. BNP on admission is 637.6. HS-troponin on admission is 92. No pulmonary edema noted on CXR.   Pertinent cardiac history: Stress test in 03/2016 with LVEF of <30%, severe LV dilation, and no perfusion defects. Echo at the same time with LVEF of 20-25%, grade I diastolic dysfunction, mild MR. LHC/RHC in 05/2017 showed mild nonobstructive CAD, cardiac index of 1.83 and PCWP of 5. LVEF unchanged in 11/2017. Echo in 09/2017 with LVEF improved to 30-35% and RV low-normal. ICD implanted in 2021. Similar in 07/2021. Echo in 01/2023 showed LVEF down to 25-30% with low-normal RV function.  Pertinent Lab Values: Creatinine, Ser  Date Value Ref Range Status  08/11/2023 1.27 (H) 0.61 - 1.24 mg/dL Final   BUN  Date Value Ref Range Status  08/11/2023 43 (H) 8 - 23 mg/dL Final  60/45/4098 23 8 - 27 mg/dL Final   Potassium  Date Value Ref Range Status  08/11/2023 3.9 3.5 - 5.1 mmol/L Final   Sodium  Date Value Ref Range Status  08/11/2023 138 135 - 145 mmol/L Final  10/11/2021 140 134 - 144 mmol/L Final   B Natriuretic Peptide  Date Value Ref Range Status  08/09/2023 884.9 (H) 0.0 - 100.0 pg/mL Final    Comment:    Performed at [facility], [address] [place], [place] [address]   Magnesium  Date Value Ref Range Status  08/11/2023 1.8 1.7 - 2.4 mg/dL Final    Comment:    Performed at [facility], [address]  [place], [place] [address]   Hgb A1c MFr Bld  Date Value Ref Range Status  08/09/2023 7.2 (H) 4.8 - 5.6 % Final    Comment:    (NOTE) Pre diabetes:          5.7%-6.4%  Diabetes:              >6.4%  Glycemic control for   <7.0% adults with diabetes    TSH  Date Value Ref Range Status  11/24/2017 0.940 0.350 - 4.500 uIU/mL Final    Comment:    Performed by a 3rd Generation assay with a functional sensitivity of <=0.01 uIU/mL. Performed at [facility], [address] [place], [place] [address]     Vital Signs: Admission weight: none Temp:  [97.6 F (36.4 C)-98.3 F (36.8 C)] 97.6 F (36.4 C) (12/20 0456) Pulse Rate:  [50-86] 84 (12/20 0456) Cardiac Rhythm: Normal sinus rhythm (12/20 0522) Resp:  [15-21] 20 (12/20 0456) BP: (126-138)/(60-86) 134/86 (12/20 0456) SpO2:  [90 %-98 %] 95 % (12/20 0456)  Intake/Output Summary (Last 24 hours) at 08/11/2023 0741 Last data filed at 08/10/2023 1638 Gross per 24 hour  Intake --  Output 1350 ml  Net -1350 ml   Current Heart Failure Medications:  Loop diuretic: none Beta-Blocker: carvedilol 6.25 mg BID ACEI/ARB/ARNI: Entresto 97/103 mg BID MRA: none SGLT2i: Farxiga 10 mg daily Other:  Prior to admission Heart Failure Medications:  Loop diuretic: furosemide 20  mg daily Beta-Blocker: carvedilol 6.25 mg BID ACEI/ARB/ARNI: Entresto 97/103 mg BID MRA: none SGLT2i: Farxiga 10 mg daily Other:  Assessment: 1. Acute on chronic systolic heart failure (LVEF 01/2023 25-30%) with low-normal RV function, due to NICM. NYHA class II-III symptoms.  -Symptoms: Reports mild improvement in symptoms. Reports only mild orthopnea. Denies LEE. Reports productive cough. -Volume: Appears euvolemic to mildly volume up. CXR negative for edema, but reported a 10 lb weight gain PTA with a elevated BNP. Urine output this far not documented. Received furosemide 60 mg IV yesterday and 40 mg IV today. Agree with holding further doses  today. -Hemodynamics: BP remains elevated with HR in 90s.  -BB: Has had a 9 beat run of NSVT. HR is in 90s. Appears relatively euvolemic with no signs of low output. Would benefit from increasing carvedilol to 12.5 mg BID. -ACEI/ARB/ARNI: Continue home Entresto 97/103 mg BID. -MRA: None. Has had potassium on the higher end in recent history. Excellent candidate for finerenone outpatient. -SGLT2i: Continue Farxiga 10 mg daily  Plan: 1) Medication changes recommended at this time: -Can consider increasing carvedilol to 12.5 mg BID given reported NSVT on telemetry and no signs of low output.  -If there is concern nonselective beta blocker is contributing to COPD exacerbation, can consider metoprolol or bisoprolol.   2) Patient assistance: -None  3) Education: - Patient has been educated on current HF medications and potential additions to HF medication regimen - Patient verbalizes understanding that over the next few months, these medication doses may change and more medications may be added to optimize HF regimen - Patient has been educated on basic disease state pathophysiology and goals of therapy  Medication Assistance / Insurance Benefits Check: Does the patient have prescription insurance?    Type of insurance plan:  Does the patient qualify for medication assistance through manufacturers or grants? No   Outpatient Pharmacy: Prior to admission outpatient pharmacy: [facility]     Please do not hesitate to reach out with questions or concerns,  Enos Fling, PharmD, CPP, BCPS Heart Failure Pharmacist  Phone - [phone] 08/11/2023 7:41 AM

## 2023-08-11 NOTE — ED Notes (Signed)
Pt spouse @ bs pt remains aox4 appears in nad piv to rac noted to not flush or draw blood during attempt for admission ordered lab draw PIV removed cannula intact although noted to be bent and kinked pt admission labs drawn via 22g butterfly straight stick pt tolerated well all blood drawn pt denies CP denies further needs continues to await cardiac echo pt continues to use urinal without apparent difficulty approx yellow urine output

## 2023-08-11 NOTE — Discharge Summary (Signed)
Jorge Cruz EPP:[id number] DOB: 18-Jan-1951 DOA: 08/09/2023  PCP: [facility]  Admit date: 08/09/2023 Discharge date: 08/11/2023  Time spent: 35 minutes  Recommendations for Outpatient Follow-up:  Heart failure clinic f/u 1/2 as scheduled Compliance with meds and heart healthy diet     Discharge Diagnoses:  Principal Problem:   Acute on chronic heart failure with reduced ejection fraction (HFrEF, EF 25-30% 01/2023%) (HCC) Active Problems:   Coronary artery disease   Nonischemic cardiomyopathy (HCC)   COPD with acute exacerbation (HCC)   Paroxysmal atrial fibrillation (HCC)   Essential hypertension   Elevated troponin   Diabetes (HCC)   Tobacco use disorder   CKD stage 3a, GFR 45-59 ml/min (HCC)   S/P implantation of automatic cardioverter/defibrillator (AICD)   Polysubstance abuse (HCC)   SOB (shortness of breath)   Congestive heart failure (HCC)   Discharge Condition: stable  Diet recommendation: heart healthy  There were no vitals filed for this visit.  History of present illness:  From admission h and p Jorge Cruz is a 72 y.o. male with medical history significant for CAD with history of PCI, HFrEF (EF 25 to 30% 01/2023) ischemic cardiomyopathy s/p AICD, paroxysmal A-fib on Eliquis DM, COPD, nicotine dependence, HTN, Who presents to the ED with a 2-week history of nonproductive cough and 1 week history of dyspnea on exertion saw his PCP and was treated with steroids and antibiotics and an inhaler but symptoms have continued.  He has started having chest pain across his chest with coughing.  Has tried nitro for the chest pain with relief.  Additionally he has had weight gain of 10 pounds in the past week, albeit without lower extremity edema or orthopnea and states compliance with his heart meds.  He denies chest pain or lower extremity pain and denies fever or chills.     Hospital Course:  Patient presents with dyspnea secondary to hfref  exacerbation, in the setting of poor compliance with home meds, lack of outpatient f/u, and dietary indescretions. With initial o2 requirement, now weaned off oxygen and ambulating feeling well. Here he was treated with IV diuresis and his symptoms improved significantly. Seen by cardiology who advised resuming home meds and increasing home lasix to 40 mg daily. All home meds have been prescribed and f/u in heart failure clinic has been scheduled for January 2nd.   Procedures: none   Consultations: cardiology  Discharge Exam: Vitals:   08/11/23 0456 08/11/23 0918  BP: 134/86 (!) 123/58  Pulse: 84 77  Resp: 20 20  Temp: 97.6 F (36.4 C) 97.8 F (36.6 C)  SpO2: 95% 93%    General exam: Appears calm and comfortable  Respiratory system: rales at bases otherwise clear Cardiovascular system: S1 & S2 heard, distant sounds Gastrointestinal system: Abdomen is obese, soft and nontender.   Central nervous system: Alert and oriented. No focal neurological deficits. Extremities: Symmetric 5 x 5 power. Trace LE edema Skin: No rashes, lesions or ulcers Psychiatry: Judgement and insight appear normal. Mood & affect appropriate.   Discharge Instructions   Discharge Instructions     Diet - low sodium heart healthy   Complete by: As directed    Increase activity slowly   Complete by: As directed       Allergies as of 08/11/2023       Reactions   Spironolactone Other (See Comments)   Hyperkalemia        Medication List     STOP taking these medications  amoxicillin-clavulanate 875-125 MG tablet Commonly known as: AUGMENTIN   metFORMIN 1000 MG tablet Commonly known as: GLUCOPHAGE   predniSONE 20 MG tablet Commonly known as: DELTASONE       TAKE these medications    albuterol 108 (90 Base) MCG/ACT inhaler Commonly known as: VENTOLIN HFA Inhale 2 puffs into the lungs every 6 (six) hours as needed for wheezing or shortness of breath.   allopurinol 100 MG  tablet Commonly known as: ZYLOPRIM Take 1 tablet by mouth daily.   apixaban 5 MG Tabs tablet Commonly known as: ELIQUIS Take 1 tablet (5 mg total) by mouth 2 (two) times daily.   atorvastatin 80 MG tablet Commonly known as: LIPITOR Take 1 tablet (80 mg total) by mouth daily.   carvedilol 6.25 MG tablet Commonly known as: COREG Take 1 tablet (6.25 mg total) by mouth 2 (two) times daily.   clotrimazole 1 % cream Commonly known as: Clotrimazole Anti-Fungal Apply 1 Application topically 2 (two) times daily. Apply to the foreskin and head of the penis   dapagliflozin propanediol 10 MG Tabs tablet Commonly known as: Farxiga Take 1 tablet (10 mg total) by mouth daily before breakfast. NEEDS FOLLOW UP APPOINTMENT FOR MORE REFILLS   fluticasone 50 MCG/ACT nasal spray Commonly known as: FLONASE Place 2 sprays into both nostrils daily.   furosemide 40 MG tablet Commonly known as: Lasix Take 1 tablet (40 mg total) by mouth daily. What changed:  medication strength how much to take   guaiFENesin-dextromethorphan 100-10 MG/5ML syrup Commonly known as: ROBITUSSIN DM Take 5 mLs by mouth every 4 (four) hours as needed for cough.   hydrocortisone cream 1 % Apply 1 Application topically 2 (two) times daily.   magnesium oxide 400 MG tablet Commonly known as: MAG-OX Take 1 tablet by mouth daily.   nitroGLYCERIN 0.4 MG SL tablet Commonly known as: NITROSTAT DISSOLVE 1 TABLET UNDER THE  TONGUE EVERY 5 MINUTES AS NEEDED FOR CHEST PAIN. MAX OF 3 TABLETS IN 15 MINUTES. CALL 911 IF PAIN  PERSISTS.   sacubitril-valsartan 97-103 MG Commonly known as: ENTRESTO Take 1 tablet by mouth 2 (two) times daily.       Allergies  Allergen Reactions   Spironolactone Other (See Comments)    Hyperkalemia    Follow-up Information     [facility] Follow up in 13 day(s).   Specialty: Cardiology Why: Hospital Follow-Up-08/24/23 @ 3:30 PM Please bring all  medications to follow-up appointment [facility], [address] Free Valet Parking Contact information: [address] [place] [address] [phone]                 The results of significant diagnostics from this hospitalization (including imaging, microbiology, ancillary and laboratory) are listed below for reference.    Significant Diagnostic Studies: DG Chest 2 View Result Date: 08/09/2023 CLINICAL DATA:  Shortness of breath EXAM: CHEST - 2 VIEW COMPARISON:  07/21/2023 FINDINGS: Left-sided pacing device. Mild cardiomegaly. No focal opacity, significant effusion, or pneumothorax IMPRESSION: No active cardiopulmonary disease. Mild cardiomegaly. Electronically Signed   By: Jasmine Pang M.D.   On: 08/09/2023 17:46   DG Chest Portable 1 View Result Date: 07/21/2023 CLINICAL DATA:  Coughing. EXAM: PORTABLE CHEST 1 VIEW COMPARISON:  07/23/2022. FINDINGS: Bilateral lung fields are clear. Bilateral costophrenic angles are clear. Mildly enlarged cardio-mediastinal silhouette, which is accentuated by AP technique. There is a left sided 2-lead pacemaker. No acute osseous abnormalities. The soft  tissues are within normal limits. IMPRESSION: *Mildly enlarged cardiomediastinal silhouette. No acute pulmonary abnormality. Electronically Signed   By: Jules Schick M.D.   On: 07/21/2023 09:28    Microbiology: Recent Results (from the past 240 hours)  Resp panel by RT-PCR (RSV, Flu A&B, Covid) Anterior Nasal Swab     Status: None   Collection Time: 08/09/23  4:09 PM   Specimen: Anterior Nasal Swab  Result Value Ref Range Status   SARS Coronavirus 2 by RT PCR NEGATIVE NEGATIVE Final    Comment: (NOTE) SARS-CoV-2 target nucleic acids are NOT DETECTED.  The SARS-CoV-2 RNA is generally detectable in upper respiratory specimens during the acute phase of infection. The lowest concentration of SARS-CoV-2 viral copies this assay can detect  is 138 copies/mL. A negative result does not preclude SARS-Cov-2 infection and should not be used as the sole basis for treatment or other patient management decisions. A negative result may occur with  improper specimen collection/handling, submission of specimen other than nasopharyngeal swab, presence of viral mutation(s) within the areas targeted by this assay, and inadequate number of viral copies(<138 copies/mL). A negative result must be combined with clinical observations, patient history, and epidemiological information. The expected result is Negative.  Fact Sheet for Patients:  BloggerCourse.com  Fact Sheet for Healthcare Providers:  SeriousBroker.it  This test is no t yet approved or cleared by the [place] FDA and  has been authorized for detection and/or diagnosis of SARS-CoV-2 by FDA under an Emergency Use Authorization (EUA). This EUA will remain  in effect (meaning this test can be used) for the duration of the COVID-19 declaration under Section 564(b)(1) of the Act, 21 U.S.C.section 360bbb-3(b)(1), unless the authorization is terminated  or revoked sooner.       Influenza A by PCR NEGATIVE NEGATIVE Final   Influenza B by PCR NEGATIVE NEGATIVE Final    Comment: (NOTE) The Xpert Xpress SARS-CoV-2/FLU/RSV plus assay is intended as an aid in the diagnosis of influenza from Nasopharyngeal swab specimens and should not be used as a sole basis for treatment. Nasal washings and aspirates are unacceptable for Xpert Xpress SARS-CoV-2/FLU/RSV testing.  Fact Sheet for Patients: BloggerCourse.com  Fact Sheet for Healthcare Providers: SeriousBroker.it  This test is not yet approved or cleared by the [place] FDA and has been authorized for detection and/or diagnosis of SARS-CoV-2 by FDA under an Emergency Use Authorization (EUA). This EUA will remain in effect  (meaning this test can be used) for the duration of the COVID-19 declaration under Section 564(b)(1) of the Act, 21 U.S.C. section 360bbb-3(b)(1), unless the authorization is terminated or revoked.     Resp Syncytial Virus by PCR NEGATIVE NEGATIVE Final    Comment: (NOTE) Fact Sheet for Patients: BloggerCourse.com  Fact Sheet for Healthcare Providers: SeriousBroker.it  This test is not yet approved or cleared by the [place] FDA and has been authorized for detection and/or diagnosis of SARS-CoV-2 by FDA under an Emergency Use Authorization (EUA). This EUA will remain in effect (meaning this test can be used) for the duration of the COVID-19 declaration under Section 564(b)(1) of the Act, 21 U.S.C. section 360bbb-3(b)(1), unless the authorization is terminated or revoked.  Performed at [facility], [address] [place], [place] [address]   Respiratory (~20 pathogens) panel by PCR     Status: None   Collection Time: 08/10/23  8:48 AM   Specimen: Nasopharyngeal Swab; Respiratory  Result Value Ref Range Status   Adenovirus NOT DETECTED NOT DETECTED Final  Coronavirus 229E NOT DETECTED NOT DETECTED Final    Comment: (NOTE) The Coronavirus on the Respiratory Panel, DOES NOT test for the novel  Coronavirus (2019 nCoV)    Coronavirus HKU1 NOT DETECTED NOT DETECTED Final   Coronavirus NL63 NOT DETECTED NOT DETECTED Final   Coronavirus OC43 NOT DETECTED NOT DETECTED Final   Metapneumovirus NOT DETECTED NOT DETECTED Final   Rhinovirus / Enterovirus NOT DETECTED NOT DETECTED Final   Influenza A NOT DETECTED NOT DETECTED Final   Influenza B NOT DETECTED NOT DETECTED Final   Parainfluenza Virus 1 NOT DETECTED NOT DETECTED Final   Parainfluenza Virus 2 NOT DETECTED NOT DETECTED Final   Parainfluenza Virus 3 NOT DETECTED NOT DETECTED Final   Parainfluenza Virus 4 NOT DETECTED NOT DETECTED Final   Respiratory Syncytial  Virus NOT DETECTED NOT DETECTED Final   Bordetella pertussis NOT DETECTED NOT DETECTED Final   Bordetella Parapertussis NOT DETECTED NOT DETECTED Final   Chlamydophila pneumoniae NOT DETECTED NOT DETECTED Final   Mycoplasma pneumoniae NOT DETECTED NOT DETECTED Final    Comment: Performed at [facility], [address], [place], [place] [address]     Labs: Basic Metabolic Panel: Recent Labs  Lab 08/09/23 1609 08/10/23 0527 08/11/23 0429  NA 141 138 138  K 3.9 4.6 3.9  CL 108 105 101  CO2 27 22 28   GLUCOSE 112* 148* 167*  BUN 28* 32* 43*  CREATININE 1.21 1.45* 1.27*  CALCIUM 9.0 8.9 8.7*  MG  --   --  1.8   Liver Function Tests: No results for input(s): "AST", "ALT", "ALKPHOS", "BILITOT", "PROT", "ALBUMIN" in the last 168 hours. No results for input(s): "LIPASE", "AMYLASE" in the last 168 hours. No results for input(s): "AMMONIA" in the last 168 hours. CBC: Recent Labs  Lab 08/09/23 1609 08/10/23 0527  WBC 7.4 5.8  NEUTROABS 3.7  --   HGB 14.2 15.4  HCT 43.8 48.0  MCV 98.6 98.8  PLT 235 227   Cardiac Enzymes: No results for input(s): "CKTOTAL", "CKMB", "CKMBINDEX", "TROPONINI" in the last 168 hours. BNP: BNP (last 3 results) Recent Labs    07/21/23 0908 08/09/23 1609  BNP 637.6* 884.9*    ProBNP (last 3 results) No results for input(s): "PROBNP" in the last 8760 hours.  CBG: Recent Labs  Lab 08/10/23 1153 08/10/23 1637 08/10/23 2238 08/11/23 0742 08/11/23 1158  GLUCAP 242* 294* 217* 171* 164*       Signed:  Silvano Bilis MD.  [facility] 08/11/2023, 1:24 PM

## 2023-08-11 NOTE — Progress Notes (Signed)
Progress Note  Patient Name: Jorge Cruz Date of Encounter: 08/11/2023  Primary Cardiologist: End  Subjective   Feels well this morning, reports breathing is much better and back to baseline.  No chest pain, palpitations, dizziness, presyncope, or syncope.  Documented urine output 1.4 L in the ED.  Renal function improved from 1.45-1.27 this morning.  Inpatient Medications    Scheduled Meds:  allopurinol  100 mg Oral Daily   apixaban  5 mg Oral BID   atorvastatin  80 mg Oral Daily   carvedilol  6.25 mg Oral BID WC   chlorpheniramine-HYDROcodone  5 mL Oral Q12H   dapagliflozin propanediol  10 mg Oral QAC breakfast   fluticasone  2 spray Each Nare Daily   furosemide  40 mg Oral Daily   guaiFENesin  600 mg Oral BID   insulin aspart  0-15 Units Subcutaneous TID WC   insulin aspart  0-5 Units Subcutaneous QHS   ipratropium-albuterol  3 mL Nebulization Q6H   nicotine  14 mg Transdermal Daily   potassium chloride  20 mEq Oral Daily   predniSONE  40 mg Oral Q breakfast   sacubitril-valsartan  1 tablet Oral BID   Continuous Infusions:  PRN Meds: acetaminophen **OR** acetaminophen, albuterol, guaiFENesin-dextromethorphan, nitroGLYCERIN, ondansetron **OR** ondansetron (ZOFRAN) IV   Vital Signs    Vitals:   08/11/23 0130 08/11/23 0145 08/11/23 0300 08/11/23 0456  BP:    134/86  Pulse: 67 61 66 84  Resp:    20  Temp:    97.6 F (36.4 C)  TempSrc:    Oral  SpO2: 96% 94% 90% 95%    Intake/Output Summary (Last 24 hours) at 08/11/2023 0901 Last data filed at 08/10/2023 1638 Gross per 24 hour  Intake --  Output 1350 ml  Net -1350 ml   There were no vitals filed for this visit.  Telemetry    Sinus rhythm with isolated PVCs - Personally Reviewed  ECG    No new tracings - Personally Reviewed  Physical Exam   GEN: No acute distress.   Neck: JVD elevated ~ 8 cm. Cardiac: RRR, no murmurs, rubs, or gallops.  Respiratory: Coarse breath sounds bilaterally.  GI: Soft,  nontender, non-distended.   MS: No edema; No deformity. Neuro:  Alert and oriented x 3; Nonfocal.  Psych: Normal affect.  Labs    Chemistry Recent Labs  Lab 08/09/23 1609 08/10/23 0527 08/11/23 0429  NA 141 138 138  K 3.9 4.6 3.9  CL 108 105 101  CO2 27 22 28   GLUCOSE 112* 148* 167*  BUN 28* 32* 43*  CREATININE 1.21 1.45* 1.27*  CALCIUM 9.0 8.9 8.7*  GFRNONAA >60 51* >60  ANIONGAP 6 11 9      Hematology Recent Labs  Lab 08/09/23 1609 08/10/23 0527  WBC 7.4 5.8  RBC 4.44 4.86  HGB 14.2 15.4  HCT 43.8 48.0  MCV 98.6 98.8  MCH 32.0 31.7  MCHC 32.4 32.1  RDW 15.2 15.3  PLT 235 227    Cardiac EnzymesNo results for input(s): "TROPONINI" in the last 168 hours. No results for input(s): "TROPIPOC" in the last 168 hours.   BNP Recent Labs  Lab 08/09/23 1609  BNP 884.9*     DDimer No results for input(s): "DDIMER" in the last 168 hours.   Radiology    DG Chest 2 View Result Date: 08/09/2023 IMPRESSION: No active cardiopulmonary disease. Mild cardiomegaly. Electronically Signed   By: Jasmine Pang M.D.   On: 08/09/2023 17:46  Cardiac Studies   2D echo 02/08/2023: 1. Left ventricular ejection fraction, by estimation, is 25 to 30%. The  left ventricle has severely decreased function. The left ventricle  demonstrates global hypokinesis. There is mild left ventricular  hypertrophy. Left ventricular diastolic parameters   are indeterminate.   2. Right ventricular systolic function is low normal. The right  ventricular size is normal.   3. The mitral valve is normal in structure. No evidence of mitral valve  regurgitation.   4. The aortic valve is grossly normal. Aortic valve regurgitation is not  visualized.   5. The inferior vena cava is normal in size with greater than 50%  respiratory variability, suggesting right atrial pressure of 3 mmHg.  __________   2D echo 08/20/2021: 1. Left ventricular ejection fraction, by estimation, is 30 to 35%. The  left  ventricle has moderate to severely decreased function. The left  ventricle demonstrates global hypokinesis. The left ventricular internal  cavity size was mildly dilated. Left  ventricular diastolic parameters are consistent with Grade I diastolic  dysfunction (impaired relaxation).   2. Right ventricular systolic function is mildly reduced. The right  ventricular size is mildly enlarged.   3. The mitral valve is normal in structure. No evidence of mitral valve  regurgitation.   4. The aortic valve is tricuspid. Aortic valve regurgitation is not  visualized.   5. The inferior vena cava is normal in size with greater than 50%  respiratory variability, suggesting right atrial pressure of 3 mmHg. __________   2D echo 10/11/2019: 1. Left ventricular ejection fraction, by estimation, is 30 to 35%. The  left ventricle has moderately decreased function. The left ventricle  demonstrates global hypokinesis. The left ventricular internal cavity size  was moderately dilated. Left  ventricular diastolic parameters were normal.   2. Right ventricular systolic function is low normal. The right  ventricular size is mildly enlarged. There is normal pulmonary artery  systolic pressure.   3. The mitral valve is normal in structure and function. Trivial mitral  valve regurgitation.   4. The aortic valve is normal in structure and function. Aortic valve  regurgitation is trivial. __________   2D echo 11/25/2017: - Left ventricle: The cavity size was mildly dilated. There was    mild concentric hypertrophy. Systolic function was severely    reduced. The estimated ejection fraction was less than 20%.    Diffuse hypokinesis. Regional wall motion abnormalities cannot be    excluded. Features are consistent with a pseudonormal left    ventricular filling pattern, with concomitant abnormal relaxation    and increased filling pressure (grade 2 diastolic dysfunction).  - Left atrium: The atrium was normal in  size.  - Right ventricle: Systolic function was mildly to moderately    reduced.  - Pulmonary arteries: Systolic pressure was mildly elevated PA peak    pressure: 35 mm Hg (S). __________   [facility] 06/01/2017: There is severe left ventricular systolic dysfunction. LV end diastolic pressure is normal. Mid RCA lesion, 30 %stenosed. Mid Cx lesion, 30 %stenosed. Mid LAD lesion, 30 %stenosed.   1. Mild nonobstructive coronary artery disease. 2. Severely reduced LV systolic function with an ejection fraction of 20%. 3. Right heart catheterization showed normal filling pressures, normal pulmonary pressure and moderately reduced cardiac output. Cardiac output was 3.82 with cardiac index of 1.83. Pulmonary capillary wedge pressure was 5 mmHg.   Recommendations: Continue medical therapy for nonischemic cardiomyopathy. The patient does not appear to be volume overloaded. Resume small  dose furosemide tomorrow. We can consider adding digoxin and spironolactone in the future but that has to be done in the outpatient setting once we make sure good compliance. __________   2D echo 04/01/2016: - Left ventricle: The cavity size was moderately dilated. There was    mild concentric hypertrophy. Systolic function was severely    reduced. The estimated ejection fraction was in the range of 20%    to 25%. Diffuse hypokinesis. Doppler parameters are consistent    with abnormal left ventricular relaxation (grade 1 diastolic    dysfunction).  - Mitral valve: There was mild regurgitation. __________   Treadmill MPI 04/01/2016: Blood pressure demonstrated a hypertensive response to exercise. There was no ST segment deviation noted during stress. No T wave inversion was noted during stress. This is an intermediate risk study. The left ventricular ejection fraction is severely decreased (<30%) with severely dilated left ventricule No evidence of perfusion defects. Possible non-ischemic  cardiomyopathy.  Patient Profile     72 y.o. male with history of nonobstructive CAD, HFrEF status post ICD in 12/2019 monitored through [facility], PAF, DM2, CKD stage II-III, HTN, COPD, and polysubstance use who is being seen today for the evaluation of acute on chronic HFrEF at the request of Dr. Para March.   Assessment & Plan    1. Acute hypoxic respiratory distress: -Improved, reports breathing is back to baseline -Eager to go home -Give oral Lasix this morning with ambulation this afternoon, if he tolerates this well, would be okay to discharge from a cardiac perspective -Suspect this is multifactorial including acute on chronic HFrEF and COPD exacerbation vs bronchitis -Remains mildly volume up -Management of pulmonary component per IM -On room air   2. Acute on chronic HFrEF secondary to NICM s/p ICD: -Reports excessive sodium intake with frequent meals at [employer] and [place] food -Remains mildly volume up -Start furosemide 40 mg daily with a follow-up BMP 1 week in the outpatient setting, suspect he will need daily furosemide with significant sodium intake at baseline -Entresto 97/103 mg bid -Coreg 6.25 mg bid -Farxiga 10 mg -CHF education    3. Nonobstructive CAD with elevated high sensitivity troponin: -Never with chest pain -Mildly elevated and flat trending, likely secondary to supply demand ischemia in the setting of volume overload and underlying renal dysfunction -Eliquis in place of ASA -No plans for inpatient ischemic testing at this time   4. PAF: -Maintaining sinus rhythm with PVCs -Coreg as above -Eliquis 5 mg bid, he does not meet reduced dosing criteria  -CHADS2VASc 4 (CHF, HTN, age x 1, vascular disease)   5. CKD stage II-III: -Monitor with diuresis    6. HTN: -Blood pressure well-controlled -Pharmacotherapy as outlined above   6. HLD: -LDL 33 in 08/2021 -Lipitor 80 mg      For questions or updates, please contact [facility] Please consult  [email or web address] for contact info under Cardiology/STEMI.    Signed, Eula Listen, PA-C [facility] Pager: [phone] 08/11/2023, 9:01 AM

## 2023-08-18 ENCOUNTER — Encounter: Payer: Self-pay | Admitting: Pulmonary Disease

## 2023-08-24 ENCOUNTER — Ambulatory Visit: Payer: 59 | Attending: Family | Admitting: Family

## 2023-08-24 ENCOUNTER — Ambulatory Visit: Payer: 59 | Admitting: Family

## 2023-08-24 ENCOUNTER — Encounter: Payer: Self-pay | Admitting: Family

## 2023-08-24 VITALS — BP 140/68 | HR 78 | Wt 219.0 lb

## 2023-08-24 DIAGNOSIS — N183 Chronic kidney disease, stage 3 unspecified: Secondary | ICD-10-CM | POA: Insufficient documentation

## 2023-08-24 DIAGNOSIS — Z79899 Other long term (current) drug therapy: Secondary | ICD-10-CM | POA: Insufficient documentation

## 2023-08-24 DIAGNOSIS — I5042 Chronic combined systolic (congestive) and diastolic (congestive) heart failure: Secondary | ICD-10-CM | POA: Diagnosis not present

## 2023-08-24 DIAGNOSIS — I480 Paroxysmal atrial fibrillation: Secondary | ICD-10-CM | POA: Diagnosis not present

## 2023-08-24 DIAGNOSIS — I130 Hypertensive heart and chronic kidney disease with heart failure and stage 1 through stage 4 chronic kidney disease, or unspecified chronic kidney disease: Secondary | ICD-10-CM | POA: Insufficient documentation

## 2023-08-24 DIAGNOSIS — I10 Essential (primary) hypertension: Secondary | ICD-10-CM | POA: Diagnosis not present

## 2023-08-24 DIAGNOSIS — I428 Other cardiomyopathies: Secondary | ICD-10-CM | POA: Insufficient documentation

## 2023-08-24 DIAGNOSIS — I5022 Chronic systolic (congestive) heart failure: Secondary | ICD-10-CM | POA: Diagnosis not present

## 2023-08-24 DIAGNOSIS — Z95810 Presence of automatic (implantable) cardiac defibrillator: Secondary | ICD-10-CM | POA: Insufficient documentation

## 2023-08-24 DIAGNOSIS — N1832 Chronic kidney disease, stage 3b: Secondary | ICD-10-CM

## 2023-08-24 DIAGNOSIS — Z7901 Long term (current) use of anticoagulants: Secondary | ICD-10-CM | POA: Diagnosis not present

## 2023-08-24 DIAGNOSIS — I2510 Atherosclerotic heart disease of native coronary artery without angina pectoris: Secondary | ICD-10-CM | POA: Diagnosis not present

## 2023-08-24 DIAGNOSIS — E1122 Type 2 diabetes mellitus with diabetic chronic kidney disease: Secondary | ICD-10-CM | POA: Diagnosis not present

## 2023-08-24 DIAGNOSIS — F17210 Nicotine dependence, cigarettes, uncomplicated: Secondary | ICD-10-CM | POA: Diagnosis not present

## 2023-08-24 DIAGNOSIS — J449 Chronic obstructive pulmonary disease, unspecified: Secondary | ICD-10-CM | POA: Insufficient documentation

## 2023-08-24 DIAGNOSIS — Z720 Tobacco use: Secondary | ICD-10-CM | POA: Diagnosis not present

## 2023-08-24 NOTE — Patient Instructions (Addendum)
 Continue weighing daily and call for an overnight weight gain of 3 pounds or more or a weekly weight gain of more than 5 pounds.   Go DOWN to LOWER LEVEL (LL) to have your blood work completed inside of [employer] office.  We will only call you if the results are abnormal or if the provider would like to make medication changes.   We have placed a referral to [facility] downstairs. You can go down to Lower Level to make this appointment or wait for them to call you.  They should reach out within a week. If they do not, please call them. Their information will be on this AVS.

## 2023-08-24 NOTE — Progress Notes (Signed)
 PCP: [facility] (last seen 11/23) Primary Cardiologist: Mady Bruckner, MD(last seen 07/23)  Chief Complaint: shortness of breath  HPI:  Jorge Cruz is a 73 y/o male with a history of DM, COPD, HTN, nonobstructive CAD, PAF, CKD, tobacco use and chronic heart failure. Had ICD implanted 05/21 monitored through [facility].   Was in the ED 07/23/22 due to an acute cough. Thought to be due to bronchitis.    Admitted 08/09/23 due to shortness of breath and cough for > 1 week. Noted 10 pound weight gain. Admits to dietary indiscretion and inconsistent medication compliance. Initially needed oxygen but able to be weaned off. IV diuresed. Cardiology consulted. Patient reports significant improvement and he was released.   Echo 10/11/19: EF of 30-35% along with trivial Jorge. Echo 07/06/20: EF of 25%. Echo 08/20/21: EF of 30-35%.    Echo 02/08/23: EF 25-30% with mild LVH   He presents today for a HF follow-up visit with a chief complaint of minimal shortness of breath. Chronic in nature but much better since recent admission. Has associated cough, intermittent chest pain (relieved with NTG), headaches and occasional dizziness. Sleeping well flat on stomach without difficulty. Reports a good appetite and says that he's using  [employer] for seasoning.   Smoking 1/2 ppd of cigarettes and drinking 1-2 mixed drinks 4- 5 times / week. Says that his goal for this 2025 year is to quit both tobacco and alcohol.   Used cocaine for ~ 30 years but quit that years ago.   Previous cardiac studies:  Remote treadmill MPI in 03/2016 showed no evidence of perfusion defects with an EF of less than 30% and was overall an intermediate risk study.   Echo 04/01/16: EF of 20-25% along with mild Jorge.  Echo 11/25/17: EF of 20-25% along with a mildly elevated PA pressure of 35 mm Hg.   Cardiac catheterization 06/01/17: EF of 20% along with mild nonobstructive CAD. RHC also done which showed normal filling pressures,  normal pulmonary pressure and moderately reduced cardiac output. Cardiac output was 3.82 with a cardiac index of 1.83.  ROS: All systems negative except as listed in HPI, PMH and Problem List.  SH:  Social History   Socioeconomic History   Marital status: Married    Spouse name: Not on file   Number of children: Not on file   Years of education: Not on file   Highest education level: Not on file  Occupational History   Not on file  Tobacco Use   Smoking status: Every Day    Current packs/day: 0.50    Average packs/day: 0.5 packs/day for 52.0 years (26.0 ttl pk-yrs)    Types: Cigarettes   Smokeless tobacco: Never  Vaping Use   Vaping status: Never Used  Substance and Sexual Activity   Alcohol use: Yes    Alcohol/week: 4.0 - 5.0 standard drinks of alcohol    Types: 1 - 2 Cans of beer, 3 Shots of liquor per week    Comment: occassional drink; past-1/2 to 1 pint liquor a week   Drug use: Not Currently    Types: Cocaine    Comment: 3 years   Sexual activity: Not Currently  Other Topics Concern   Not on file  Social History Narrative   Not on file   Social Drivers of Health   Financial Resource Strain: Low Risk  (08/10/2023)   Overall Financial Resource Strain (CARDIA)    Difficulty of Paying Living Expenses: Not very hard  Food  Insecurity: Not on file  Transportation Needs: No Transportation Needs (08/10/2023)   PRAPARE - Administrator, Civil Service (Medical): No    Lack of Transportation (Non-Medical): No  Physical Activity: Not on file  Stress: Not on file  Social Connections: Not on file  Intimate Partner Violence: Not on file    FH:  Family History  Problem Relation Age of Onset   Diabetes Mother    Diabetes Father     Past Medical History:  Diagnosis Date   CHF (congestive heart failure) (HCC)    Chronic combined systolic (congestive) and diastolic (congestive) heart failure (HCC)    a. 03/2014 Echo: EF 45%, glob HK; b. 03/2016 Echo: EF  20-25%, diff HK, Gr1 DD, mild Jorge; b. 11/2017 Echo: EF 20%, diff HK, Gr2 DD, mildly to mod reduced RV fxn, PASP .   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 03/2014 Echo: EF 45%, glob HK, mild conc LVH; b. 03/2014 MV: possible mild ischemia, superimposed on small inf infarct, EF 36%; c. 03/2016 MV: EF <30%, no ischemia; d. 03/2016 Echo: EF 20-25%, diff HK, Gr1 DD, mild Jorge; e. 05/2017 Cath: nonobs dzs, EF 20%; f. 11/2017 Echo: EF 20%, diff HK, Gr2 DD.   Non-obstructive CAD (coronary artery disease)    a. 05/2017 Cath: LM nl, LAD 23m, LCX 51m, RCA 35m, EF 20%.   Paroxysmal atrial fibrillation (HCC)    Polysubstance abuse (HCC)    Tobacco abuse     Current Outpatient Medications  Medication Sig Dispense Refill   albuterol  (PROVENTIL  HFA;VENTOLIN  HFA) 108 (90 Base) MCG/ACT inhaler Inhale 2 puffs into the lungs every 6 (six) hours as needed for wheezing or shortness of breath.     allopurinol  (ZYLOPRIM ) 100 MG tablet Take 1 tablet by mouth daily.     apixaban  (ELIQUIS ) 5 MG TABS tablet Take 1 tablet (5 mg total) by mouth 2 (two) times daily. 180 tablet 1   atorvastatin  (LIPITOR ) 80 MG tablet Take 1 tablet (80 mg total) by mouth daily. 90 tablet 1   carvedilol  (COREG ) 6.25 MG tablet Take 1 tablet (6.25 mg total) by mouth 2 (two) times daily. 180 tablet 3   clotrimazole  (CLOTRIMAZOLE  ANTI-FUNGAL) 1 % cream Apply 1 Application topically 2 (two) times daily. Apply to the foreskin and head of the penis 30 g 0   dapagliflozin  propanediol (FARXIGA ) 10 MG TABS tablet Take 1 tablet (10 mg total) by mouth daily before breakfast. NEEDS FOLLOW UP APPOINTMENT FOR MORE REFILLS 90 tablet 2   fluticasone  (FLONASE ) 50  MCG/ACT nasal spray Place 2 sprays into both nostrils daily.     furosemide  (LASIX ) 40 MG tablet Take 1 tablet (40 mg total) by mouth daily. 90 tablet 1   guaiFENesin -dextromethorphan  (ROBITUSSIN DM) 100-10 MG/5ML syrup Take 5 mLs by mouth every 4 (four) hours as needed for cough. 118 mL 0   hydrocortisone cream 1 % Apply 1 Application topically 2 (two) times daily.     magnesium oxide (MAG-OX) 400 MG tablet Take 1 tablet by mouth daily.     nitroGLYCERIN  (NITROSTAT ) 0.4 MG SL tablet DISSOLVE 1 TABLET UNDER THE  TONGUE EVERY 5 MINUTES AS NEEDED FOR CHEST PAIN.  MAX OF 3 TABLETS IN 15 MINUTES. CALL 911 IF PAIN  PERSISTS. 25 tablet 0   sacubitril -valsartan  (ENTRESTO ) 97-103 MG Take 1 tablet by mouth 2 (two) times daily. 180 tablet 3   No current facility-administered medications for this visit.   Vitals:   08/24/23 1523  BP: (!) 140/68  Pulse: 78  SpO2: 97%  Weight: 219 lb (99.3 kg)   Wt Readings from Last 3 Encounters:  08/24/23 219 lb (99.3 kg)  07/21/23 219 lb (99.3 kg)  04/08/23 216 lb (98 kg)   Lab Results  Component Value Date   CREATININE 1.27 (H) 08/11/2023   CREATININE 1.45 (H) 08/10/2023   CREATININE 1.21 08/09/2023   PHYSICAL EXAM:  General:  Well appearing. No resp difficulty HEENT: normal Neck: supple. JVP flat. No lymphadenopathy or thryomegaly appreciated. Cor: PMI normal. Regular rate & rhythm. No rubs, gallops or murmurs. Lungs: clear Abdomen: soft, nontender, nondistended. No hepatosplenomegaly. No bruits or masses.  Extremities: no cyanosis, clubbing, rash, edema Neuro: alert & oriented x3, cranial nerves grossly intact. Moves all 4 extremities w/o difficulty. Affect pleasant.   ECG: not done   ASSESSMENT & PLAN:  1: NICM with reduced ejection fraction- - likely due to HTN - NYHA class II - euvolemic today - weighing himself daily; reminded to call for an overnight weight gain of >2 pounds or a weekly weight gain of >5 pounds - weight down 14 pounds  since he was last here 7 months ago - Echo 07/06/20: EF of 25%.  - Echo 08/20/21: EF of 30-35%.  - Echo 02/08/23: EF 25-30% with mild LVH  - continue carvedilol  6.25mg  BID - continue farxiga  10mg  daily - continue furosemide  40mg  daily - continue entresto  97/103mg  BID - previously had been on spironolactone  but he developed hyperkalemia - had ICD implantation 01/02/20; denies any firing of ICD - using [employer] for seasoning and doing better with sodium intake - BNP 08/09/23 was 884.9  2: HTN- - BP 140/68 - [facility] at [facility] and has to make f/u appointment - BMP 08/11/23 showed sodium 138, potassium 3.9, creatinine 1.27 and GFR >60 - BMET today  3: DM- - A1c on 08/09/23 was 7.2%  4:Tobacco use-  - smoking 1/2-1 ppd of cigarettes & plans to quit this year - explained that there is the availability of a health coach to assist with this when he's ready - has 1-2 mixed drinks 4-5 times/ week and plans to quit this year - complete cessation discussed for 3 minutes with the patient  5: Non-obstructive CAD- - saw cardiology ([place]) 07/23; referral placed today so he can get hospital f/u scheduled - continue atorvastatin  80mg  daily - LDL 09/02/21 was 33 - Cardiac catheterization 06/01/17: EF of 20% along with mild nonobstructive CAD. RHC also done which showed normal filling pressures,  normal pulmonary pressure and moderately reduced cardiac output. Cardiac output was 3.82 with a cardiac index of 1.83.  6: PAF- - continue apixaban  5mg  BID - HR regular today  Return in 1 month, sooner if needed. Emphasized bringing medications to every visit to ensure medication accuracy.

## 2023-08-25 LAB — BASIC METABOLIC PANEL
BUN/Creatinine Ratio: 17 (ref 10–24)
BUN: 26 mg/dL (ref 8–27)
CO2: 26 mmol/L (ref 20–29)
Calcium: 9.9 mg/dL (ref 8.6–10.2)
Chloride: 104 mmol/L (ref 96–106)
Creatinine, Ser: 1.51 mg/dL — ABNORMAL HIGH (ref 0.76–1.27)
Glucose: 92 mg/dL (ref 70–99)
Potassium: 4.8 mmol/L (ref 3.5–5.2)
Sodium: 145 mmol/L — ABNORMAL HIGH (ref 134–144)
eGFR: 49 mL/min/{1.73_m2} — ABNORMAL LOW (ref >59)

## 2023-08-30 ENCOUNTER — Encounter: Payer: Self-pay | Admitting: Acute Care

## 2023-09-01 ENCOUNTER — Telehealth: Payer: Self-pay

## 2023-09-01 DIAGNOSIS — I5022 Chronic systolic (congestive) heart failure: Secondary | ICD-10-CM

## 2023-09-01 NOTE — Telephone Encounter (Signed)
-----   Message from Delma Freeze sent at 08/25/2023  8:24 AM EST ----- Kidney function a little worse since discharge. Potassium is normal. Make sure drinking 60-64 ounces of fluid daily. Recheck BMET in 2 weeks

## 2023-09-04 ENCOUNTER — Ambulatory Visit: Payer: 59 | Admitting: Family

## 2023-09-04 ENCOUNTER — Other Ambulatory Visit
Admission: RE | Admit: 2023-09-04 | Discharge: 2023-09-04 | Disposition: A | Discharge status: Home / Self Care | Payer: 59 | Attending: Family | Admitting: Family

## 2023-09-04 DIAGNOSIS — I5022 Chronic systolic (congestive) heart failure: Secondary | ICD-10-CM | POA: Insufficient documentation

## 2023-09-04 LAB — BASIC METABOLIC PANEL
Anion gap: 9 (ref 5–15)
BUN: 24 mg/dL — ABNORMAL HIGH (ref 8–23)
CO2: 23 mmol/L (ref 22–32)
Calcium: 9.3 mg/dL (ref 8.9–10.3)
Chloride: 105 mmol/L (ref 98–111)
Creatinine, Ser: 1.17 mg/dL (ref 0.61–1.24)
GFR, Estimated: >60 mL/min (ref >60)
Glucose, Bld: 182 mg/dL — ABNORMAL HIGH (ref 70–99)
Potassium: 4.7 mmol/L (ref 3.5–5.1)
Sodium: 137 mmol/L (ref 135–145)

## 2023-09-12 ENCOUNTER — Ambulatory Visit: Payer: 59 | Attending: Cardiology | Admitting: Cardiology

## 2023-09-12 ENCOUNTER — Encounter: Payer: Self-pay | Admitting: Cardiology

## 2023-09-12 VITALS — BP 120/64 | HR 77 | Ht 68.0 in | Wt 220.2 lb

## 2023-09-12 DIAGNOSIS — I2510 Atherosclerotic heart disease of native coronary artery without angina pectoris: Secondary | ICD-10-CM

## 2023-09-12 DIAGNOSIS — N182 Chronic kidney disease, stage 2 (mild): Secondary | ICD-10-CM

## 2023-09-12 DIAGNOSIS — I480 Paroxysmal atrial fibrillation: Secondary | ICD-10-CM | POA: Diagnosis not present

## 2023-09-12 DIAGNOSIS — I10 Essential (primary) hypertension: Secondary | ICD-10-CM | POA: Diagnosis not present

## 2023-09-12 DIAGNOSIS — I428 Other cardiomyopathies: Secondary | ICD-10-CM | POA: Diagnosis not present

## 2023-09-12 DIAGNOSIS — I5022 Chronic systolic (congestive) heart failure: Secondary | ICD-10-CM | POA: Diagnosis not present

## 2023-09-12 DIAGNOSIS — I739 Peripheral vascular disease, unspecified: Secondary | ICD-10-CM

## 2023-09-12 NOTE — Progress Notes (Signed)
Cardiology Office Note:  .   Date:  09/12/2023  ID:  Jorge Cruz, DOB October 02, 1950, MRN [id number] PCP: [facility]  [facility] Providers Cardiologist:  Yvonne Kendall, MD    History of Present Illness: .   Jorge Cruz is a 73 y.o. male with a past medical history of nonobstructive coronary artery disease, chronic HFrEF status post ICD (12/2019), paroxysmal atrial fibrillation, type 2 diabetes, CKD stage II-III, hypertension, COPD, polysubstance abuse who is here today to follow-up after recent hospitalization after acute on chronic HFrEF exacerbation.   Mr. Mischke underwent remote treadmill MPI in 03/2016 that showed no evidence of perfusion defects with an EF of less than 30% and was overall an intermediate risk study. Blood pressure demonstrated a hypertensive response to exercise. Echo at that time demonstrated an EF of 20 to 25%, grade 1 diastolic dysfunction, mild concentric LVH, and mild mitral regurgitation. Subsequent R/LHC in 05/2017 showed mild nonobstructive CAD as outlined below with an EF of 20%. RHC showed normal filling pressures, normal pulmonary pressure, and moderately reduced cardiac output. Over the years, he has had a persistent cardiomyopathy with echo from 07/2021 demonstrating an EF of 30 to 35%, global hypokinesis, mildly dilated LV internal cavity size, grade 1 diastolic dysfunction, mildly reduced RV systolic function with mildly enlarged ventricular cavity size, no significant valvular abnormalities, and an estimated right atrial pressure of 3 mmHg. He underwent ICD implantation at [facility] in 2021. He has intermittently been followed by both [facility] cardiology, [facility] cardiology, [facility], and the [facility] more recently. Most recent echo from 01/2023 showed an EF of 25-30%, global hypokinesis, mild LVH, low normal RVSF with normal ventricular cavity size, and an estimated right atrial pressure of 3 mmHg.  He was evaluated in the  emergency department in late 06/2023 with complaints of cough.  BNP at that time was 637 with a high-sensitivity troponin peaking at 92.  Chest x-ray was without acute cardiopulmonary process.  He was given DuoNebs advised to take his Lasix daily he was subsequently discharged home.  He was scheduled outpatient follow-up with advanced heart failure but was a no-show for his appointment 08/07/2023.   He was admitted to [facility] 08/09/2023 with 3-year history of shortness of breath, dry cough, and abdominal swelling.  He denied chest pain, palpitations, dizziness syncope or near syncope.  He typically had noted abdominal swelling while he was volume up.  Reported 10 pound weight gain over the past 3 weeks.  Was recently treated at [facility] less than a week ago with steroids for COPD exacerbation.  Often has a dietary indiscretions any other restaurants most mornings.  Reported he had not been taking his Lasix daily at home.  Continues to smoke tobacco.  Chest x-ray showed mild cardiomegaly with no active cardiopulmonary process.  High-sensitivity troponin of 125 with a delta of 155 peaking at 159 trending down to 148.  BNP 884.  COVID/influenza/RSV PCR swab was negative.  He initially had to be placed on oxygen oxygen was weaned off and he was ambulating well.  He was treated with IV diuresis and his symptoms improved significantly.  His home furosemide was increased to 40 mg daily.  He was continued on GDMT and had an outpatient follow-up appointment made with advanced heart failure for January 2.  He was continued on his current medication regimen and was sent for follow-up labs.  He returns to clinic today stating that overall he has been feeling well.  He continues to have occasional shortness of breath on exertion but he states it has been improving.  He states that this time he has been compliant with his current medications without any adverse side effects with exception of his metformin which he  feels as though the dose is too high and causing worsening diarrhea.  He also complains of hip and leg pain on ambulation.  He denies any chest pain, palpitations, weight gain, early satiety.  States that he has refrained from eating out as often as he was prior to his recent hospitalization.  States that he has noted on occasion a couple of drops of blood on toilet tissue or his underclothing since being on apixaban his last hemoglobin was 15.4 which was stable.  He has been encouraged to continue to monitor for any blood noted in his stool or his urine.  ROS: Point review of systems has been reviewed and considered negative except what is listed in the HPI  Studies Reviewed: Marland Kitchen   EKG Interpretation Date/Time:  Tuesday September 12 2023 13:28:59 EST Ventricular Rate:  77 PR Interval:  196 QRS Duration:  94 QT Interval:  392 QTC Calculation: 443 R Axis:   -6  Text Interpretation: Normal sinus rhythm Left atrial enlargement large amount of artifact Possible Anterior infarct (cited on or before 21-Jul-2023) When compared with ECG of 09-Aug-2023 16:17, Confirmed by Charlsie Quest ([id number]) on 09/12/2023 1:35:04 PM   2D echo 02/08/2023 1. Left ventricular ejection fraction, by estimation, is 25 to 30%. The  left ventricle has severely decreased function. The left ventricle  demonstrates global hypokinesis. There is mild left ventricular  hypertrophy. Left ventricular diastolic parameters   are indeterminate.   2. Right ventricular systolic function is low normal. The right  ventricular size is normal.   3. The mitral valve is normal in structure. No evidence of mitral valve  regurgitation.   4. The aortic valve is grossly normal. Aortic valve regurgitation is not  visualized.   5. The inferior vena cava is normal in size with greater than 50%  respiratory variability, suggesting right atrial pressure of 3 mmHg.   2D echo 08/20/2021 1. Left ventricular ejection fraction, by estimation, is 30 to  35%. The  left ventricle has moderate to severely decreased function. The left  ventricle demonstrates global hypokinesis. The left ventricular internal  cavity size was mildly dilated. Left  ventricular diastolic parameters are consistent with Grade I diastolic  dysfunction (impaired relaxation).   2. Right ventricular systolic function is mildly reduced. The right  ventricular size is mildly enlarged.   3. The mitral valve is normal in structure. No evidence of mitral valve  regurgitation.   4. The aortic valve is tricuspid. Aortic valve regurgitation is not  visualized.   5. The inferior vena cava is normal in size with greater than 50%  respiratory variability, suggesting right atrial pressure of 3 mmHg.    Risk Assessment/Calculations:    CHA2DS2-VASc Score = 4   This indicates a 4.8% annual risk of stroke. The patient's score is based upon: CHF History: 1 HTN History: 1 Diabetes History: 1 Stroke History: 0 Vascular Disease History: 0 Age Score: 1 Gender Score: 0            Physical Exam:   VS:  BP 120/64   Pulse 77   Ht 5\' 8"  (1.727 m)   Wt 220 lb 3.2 oz (99.9 kg)   SpO2 100%   BMI 33.48 kg/m  Wt Readings from Last 3 Encounters:  09/12/23 220 lb 3.2 oz (99.9 kg)  08/24/23 219 lb (99.3 kg)  07/21/23 219 lb (99.3 kg)    GEN: Well nourished, well developed in no acute distress NECK: No JVD; No carotid bruits CARDIAC: RRR, no murmurs, rubs, gallops RESPIRATORY:  Clear to auscultation without rales, wheezing or rhonchi  ABDOMEN: Soft, non-tender, non-distended EXTREMITIES:  No edema; No deformity   ASSESSMENT AND PLAN: .   Chronic HFrEF secondary to NICM status post ICD where he reports occasional shortness of breath.  Echocardiogram completed 02/08/2023 revealed LVEF of 25-30% with mild LVH .weight is down.  He is continued on GDMT of Entresto 97/103 mg twice daily, carvedilol 6.25 mg twice daily, Farxiga 10 mg daily, furosemide 40 mg daily.  He is intolerant  to MRA with recurrent hyperkalemia.  He appears to be euvolemic on exam today.  Has been working on decreasing his sodium and fluid intake.  Has been encouraged to continue to maintain all of his follow-up appointments with advanced heart failure.  Nonobstructive coronary artery disease with recent hospitalization with an elevated high-sensitivity troponin that was likely secondary to supply demand ischemia in setting of volume overload and underlying renal dysfunction.  Continues to deny angina or anginal equivalents.  EKG today reveals sinus rhythm with rate of 77 with left atrial enlargement and artifact.  He is continued on apixaban and lieu of aspirin and atorvastatin 80 mg daily.  Paroxysmal atrial fibrillation where he is maintaining sinus rhythm today.  He is continued on carvedilol 6.25 mg twice daily.  He is also continued on apixaban 5 mg twice daily for CHA2DS2-VASc of at least 4 for stroke prophylaxis.  CKD stage II-III.  Recent serum creatinine was back to baseline.  Continue to monitor kidney function with diuresis.  Hypertension with blood pressure today 120/64.  He is continued on carvedilol and furosemide.  Encouraged to continue to monitor his pressures 1 to 2 hours postmedication administration as well.  Intermittent claudication with pain to bilateral lower extremities on ambulation and hip pain.  He has been scheduled for ABI's.  Mixed hyperlipidemia with an LDL of 33 in 2023.  Continued on atorvastatin 80 mg daily we will update lipid panel on return.       Dispo: Patient return to clinic to see MD/APP in 3 months or sooner if needed  Signed, Laurel Harnden, NP

## 2023-09-12 NOTE — Patient Instructions (Signed)
Medication Instructions:  The current medical regimen is effective;  continue present plan and medications.  *If you need a refill on your cardiac medications before your next appointment, please call your pharmacy*   Testing/Procedures: Your physician has requested that you have an ankle brachial index (ABI). During this test an ultrasound and blood pressure cuff are used to evaluate the arteries that supply the arms and legs with blood. Allow thirty minutes for this exam. There are no restrictions or special instructions.  Please note: We ask at that you not bring children with you during ultrasound (echo/ vascular) testing. Due to room size and safety concerns, children are not allowed in the ultrasound rooms during exams. Our front office staff cannot provide observation of children in our lobby area while testing is being conducted. An adult accompanying a patient to their appointment will only be allowed in the ultrasound room at the discretion of the ultrasound technician under special circumstances. We apologize for any inconvenience.   Follow-Up: At [facility], you and your health needs are our priority.  As part of our continuing mission to provide you with exceptional heart care, we have created designated Provider Care Teams.  These Care Teams include your primary Cardiologist (physician) and Advanced Practice Providers (APPs -  Physician Assistants and Nurse Practitioners) who all work together to provide you with the care you need, when you need it.  We recommend signing up for the patient portal called "MyChart".  Sign up information is provided on this After Visit Summary.  MyChart is used to connect with patients for Virtual Visits (Telemedicine).  Patients are able to view lab/test results, encounter notes, upcoming appointments, etc.  Non-urgent messages can be sent to your provider as well.   To learn more about what you can do with MyChart, go to  [email or web address].    Your next appointment:   3 month(s)  Provider:   You may see Yvonne Kendall, MD or one of the following Advanced Practice Providers on your designated Care Team:   Nicolasa Ducking, NP Eula Listen, PA-C Cadence Fransico Michael, PA-C Charlsie Quest, NP Carlos Levering, NP

## 2023-09-18 ENCOUNTER — Other Ambulatory Visit: Payer: Self-pay

## 2023-09-18 DIAGNOSIS — Z122 Encounter for screening for malignant neoplasm of respiratory organs: Secondary | ICD-10-CM

## 2023-09-18 DIAGNOSIS — Z87891 Personal history of nicotine dependence: Secondary | ICD-10-CM

## 2023-09-18 DIAGNOSIS — F17210 Nicotine dependence, cigarettes, uncomplicated: Secondary | ICD-10-CM

## 2023-09-27 NOTE — Progress Notes (Deleted)
 Advanced Heart Failure Clinic Note    PCP: [facility] (last seen 11/23) Primary Cardiologist: Mady Bruckner, MD(last seen 07/23)  Chief Complaint: shortness of breath  HPI:  Jorge Cruz is a 73 y/o male with a history of DM, COPD, HTN, nonobstructive CAD, PAF, CKD, tobacco use and chronic heart failure. Had ICD implanted 05/21 monitored through [facility].   Was in the ED 07/23/22 due to an acute cough. Thought to be due to bronchitis.    Admitted 08/09/23 due to shortness of breath and cough for > 1 week. Noted 10 pound weight gain. Admits to dietary indiscretion and inconsistent medication compliance. Initially needed oxygen but able to be weaned off. IV diuresed. Cardiology consulted. Patient reports significant improvement and he was released.   Echo 10/11/19: EF of 30-35% along with trivial Jorge. Echo 07/06/20: EF of 25%. Echo 08/20/21: EF of 30-35%.    Echo 02/08/23: EF 25-30% with mild LVH   He presents today for a HF follow-up visit with a chief complaint of minimal shortness of breath. Chronic in nature but much better since recent admission. Has associated cough, intermittent chest pain (relieved with NTG), headaches and occasional dizziness. Sleeping well flat on stomach without difficulty. Reports a good appetite and says that he's using  Mrs Hollis for seasoning.   Smoking 1/2 ppd of cigarettes and drinking 1-2 mixed drinks 4- 5 times / week. Says that his goal for this 2025 year is to quit both tobacco and alcohol.   Used cocaine for ~ 30 years but quit that years ago.   Previous cardiac studies:  Remote treadmill MPI in 03/2016 showed no evidence of perfusion defects with an EF of less than 30% and was overall an intermediate risk study.   Echo 04/01/16: EF of 20-25% along with mild Jorge.  Echo 11/25/17: EF of 20-25% along with a mildly elevated PA pressure of 35 mm Hg.   Cardiac catheterization 06/01/17: EF of 20% along with mild nonobstructive CAD. RHC also done  which showed normal filling pressures, normal pulmonary pressure and moderately reduced cardiac output. Cardiac output was 3.82 with a cardiac index of 1.83.  ROS: All systems negative except as listed in HPI, PMH and Problem List.  SH:  Social History   Socioeconomic History   Marital status: Married    Spouse name: Not on file   Number of children: Not on file   Years of education: Not on file   Highest education level: Not on file  Occupational History   Not on file  Tobacco Use   Smoking status: Every Day    Current packs/day: 0.50    Average packs/day: 0.5 packs/day for 52.0 years (26.0 ttl pk-yrs)    Types: Cigarettes   Smokeless tobacco: Never  Vaping Use   Vaping status: Never Used  Substance and Sexual Activity   Alcohol use: Yes    Alcohol/week: 4.0 - 5.0 standard drinks of alcohol    Types: 1 - 2 Cans of beer, 3 Shots of liquor per week    Comment: occassional drink; past-1/2 to 1 pint liquor a week   Drug use: Not Currently    Types: Cocaine    Comment: 3 years   Sexual activity: Not Currently  Other Topics Concern   Not on file  Social History Narrative   Not on file   Social Drivers of Health   Financial Resource Strain: Low Risk  (08/10/2023)   Overall Financial Resource Strain (CARDIA)    Difficulty of  Paying Living Expenses: Not very hard  Food Insecurity: Not on file  Transportation Needs: No Transportation Needs (08/10/2023)   PRAPARE - Administrator, Civil Service (Medical): No    Lack of Transportation (Non-Medical): No  Physical Activity: Not on file  Stress: Not on file  Social Connections: Not on file  Intimate Partner Violence: Not on file    FH:  Family History  Problem Relation Age of Onset   Diabetes Mother    Diabetes Father     Past Medical History:  Diagnosis Date   CHF (congestive heart failure) (HCC)    Chronic combined systolic (congestive) and diastolic (congestive) heart failure (HCC)    a. 03/2014 Echo: EF  45%, glob HK; b. 03/2016 Echo: EF 20-25%, diff HK, Gr1 DD, mild Jorge; b. 11/2017 Echo: EF 20%, diff HK, Gr2 DD, mildly to mod reduced RV fxn, PASP .   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 03/2014 Echo: EF 45%, glob HK, mild conc LVH; b. 03/2014 MV: possible mild ischemia, superimposed on small inf infarct, EF 36%; c. 03/2016 MV: EF <30%, no ischemia; d. 03/2016 Echo: EF 20-25%, diff HK, Gr1 DD, mild Jorge; e. 05/2017 Cath: nonobs dzs, EF 20%; f. 11/2017 Echo: EF 20%, diff HK, Gr2 DD.   Non-obstructive CAD (coronary artery disease)    a. 05/2017 Cath: LM nl, LAD 33m, LCX 1m, RCA 36m, EF 20%.   Paroxysmal atrial fibrillation (HCC)    Polysubstance abuse (HCC)    Tobacco abuse     Current Outpatient Medications  Medication Sig Dispense Refill   albuterol  (PROVENTIL  HFA;VENTOLIN  HFA) 108 (90 Base) MCG/ACT inhaler Inhale 2 puffs into the lungs every 6 (six) hours as needed for wheezing or shortness of breath.     allopurinol  (ZYLOPRIM ) 100 MG tablet Take 1 tablet by mouth daily.     apixaban  (ELIQUIS ) 5 MG TABS tablet Take 1 tablet (5 mg total) by mouth 2 (two) times daily. 180 tablet 1   atorvastatin  (LIPITOR ) 80 MG tablet Take 1 tablet (80 mg total) by mouth daily. 90 tablet 1   carvedilol  (COREG ) 6.25 MG tablet Take 1 tablet (6.25 mg total) by mouth 2 (two) times daily. 180 tablet 3   clotrimazole  (CLOTRIMAZOLE  ANTI-FUNGAL) 1 % cream Apply 1 Application topically 2 (two) times daily. Apply to the foreskin and head of the penis 30 g 0   dapagliflozin  propanediol (FARXIGA ) 10 MG TABS tablet Take 1 tablet (10 mg total) by mouth daily before breakfast. NEEDS FOLLOW UP APPOINTMENT FOR MORE REFILLS 90  tablet 2   fluticasone  (FLONASE ) 50 MCG/ACT nasal spray Place 2 sprays into both nostrils daily.     furosemide  (LASIX ) 40 MG tablet Take 1 tablet (40 mg total) by mouth daily. 90 tablet 1   guaiFENesin -dextromethorphan  (ROBITUSSIN DM) 100-10 MG/5ML syrup Take 5 mLs by mouth every 4 (four) hours as needed for cough. 118 mL 0   hydrocortisone cream 1 % Apply 1 Application topically 2 (two) times daily.     metFORMIN  (GLUCOPHAGE ) 1000 MG tablet Take 1,000 mg by mouth 2 (two) times daily.     nitroGLYCERIN  (NITROSTAT ) 0.4 MG SL tablet DISSOLVE 1 TABLET UNDER THE  TONGUE EVERY 5 MINUTES AS NEEDED FOR CHEST PAIN. MAX OF 3 TABLETS IN 15 MINUTES. CALL 911 IF PAIN  PERSISTS. 25 tablet 0   sacubitril -valsartan  (ENTRESTO ) 97-103 MG Take 1 tablet by mouth 2 (two) times daily. 180 tablet 3   No current facility-administered medications for this visit.   There were no vitals filed for this visit.  Wt Readings from Last 3 Encounters:  09/12/23 220 lb 3.2 oz (99.9 kg)  08/24/23 219 lb (99.3 kg)  07/21/23 219 lb (99.3 kg)   Lab Results  Component Value Date   CREATININE 1.17 09/04/2023   CREATININE 1.51 (H) 08/24/2023   CREATININE 1.27 (H) 08/11/2023   PHYSICAL EXAM:  General:  Well appearing. No resp difficulty HEENT: normal Neck: supple. JVP flat. No lymphadenopathy or thryomegaly appreciated. Cor: PMI normal. Regular rate & rhythm. No rubs, gallops or murmurs. Lungs: clear Abdomen: soft, nontender, nondistended. No hepatosplenomegaly. No bruits or masses.  Extremities: no cyanosis, clubbing, rash, edema Neuro: alert & oriented x3, cranial nerves grossly intact. Moves all 4 extremities w/o difficulty. Affect pleasant.   ECG: not done   ASSESSMENT & PLAN:  1: NICM with reduced ejection fraction- - likely due to HTN - NYHA class II - euvolemic today - weighing himself daily; reminded to call for an overnight weight gain of >2 pounds or a weekly weight gain of >5 pounds - weight down 14  pounds since he was last here 7 months ago - Echo 07/06/20: EF of 25%.  - Echo 08/20/21: EF of 30-35%.  - Echo 02/08/23: EF 25-30% with mild LVH  - continue carvedilol  6.25mg  BID - continue farxiga  10mg  daily - continue furosemide  40mg  daily - continue entresto  97/103mg  BID - previously had been on spironolactone  but he developed hyperkalemia - had ICD implantation 01/02/20; denies any firing of ICD - using Mrs Hollis for seasoning and doing better with sodium intake - BNP 08/09/23 was 884.9  2: HTN- - BP 140/68 - sees PCP at [facility] and has to make f/u appointment - BMP 08/11/23 showed sodium 138, potassium 3.9, creatinine 1.27 and GFR >60 - BMET today  3: DM- - A1c on 08/09/23 was 7.2%  4:Tobacco use-  - smoking 1/2-1 ppd of cigarettes & plans to quit this year - explained that there is the availability of a health coach to assist with this when he's ready - has 1-2 mixed drinks 4-5 times/ week and plans to quit this year - complete cessation discussed for 3 minutes with the patient  5: Non-obstructive CAD- - saw cardiology ([facility]) 07/23; referral placed today so he can get hospital f/u scheduled - continue atorvastatin  80mg  daily - LDL 09/02/21 was 33 - Cardiac catheterization 06/01/17: EF of 20% along with mild nonobstructive CAD. RHC also done which showed normal filling pressures,  normal pulmonary pressure and moderately reduced cardiac output. Cardiac output was 3.82 with a cardiac index of 1.83.  6: PAF- - continue apixaban  5mg  BID - HR regular today  Return in 1 month, sooner if needed. Emphasized bringing medications to every visit to ensure medication accuracy.      Ellouise DELENA Class, FNP 09/27/23

## 2023-09-28 ENCOUNTER — Telehealth: Payer: Self-pay | Admitting: Family

## 2023-09-28 ENCOUNTER — Encounter: Payer: 59 | Admitting: Family

## 2023-09-28 NOTE — Telephone Encounter (Signed)
 Called pt to r/s missed appt on 09/28/23 no vm box set up

## 2023-09-29 ENCOUNTER — Telehealth: Payer: Self-pay | Admitting: Family

## 2023-09-29 NOTE — Telephone Encounter (Signed)
 Vm box not setup unable to r/s pt n/s on 09/28/23

## 2023-10-03 ENCOUNTER — Ambulatory Visit: Payer: 59 | Attending: Cardiology

## 2023-10-11 NOTE — Telephone Encounter (Signed)
 No answer on patient's phone so his spouse (who is listed as emergency contact) was called. She handed the phone to the patient who then r/s his missed appointment to 10/18/23. Encouraged him to write his appointments down because he said that he tends to forget them.

## 2023-10-13 ENCOUNTER — Ambulatory Visit: Payer: 59 | Admitting: Internal Medicine

## 2023-10-16 ENCOUNTER — Other Ambulatory Visit: Payer: Self-pay | Admitting: Family

## 2023-10-17 ENCOUNTER — Telehealth: Payer: Self-pay | Admitting: Family

## 2023-10-17 ENCOUNTER — Ambulatory Visit
Admission: RE | Admit: 2023-10-17 | Discharge: 2023-10-17 | Disposition: A | Discharge status: Home / Self Care | Payer: 59 | Source: Ambulatory Visit | Attending: Acute Care | Admitting: Acute Care

## 2023-10-17 DIAGNOSIS — Z122 Encounter for screening for malignant neoplasm of respiratory organs: Secondary | ICD-10-CM | POA: Diagnosis present

## 2023-10-17 DIAGNOSIS — F17210 Nicotine dependence, cigarettes, uncomplicated: Secondary | ICD-10-CM | POA: Insufficient documentation

## 2023-10-17 DIAGNOSIS — Z87891 Personal history of nicotine dependence: Secondary | ICD-10-CM | POA: Diagnosis present

## 2023-10-17 NOTE — Telephone Encounter (Signed)
 Pt vm box not set up unable to conform appt for 10/18/23

## 2023-10-17 NOTE — Progress Notes (Unsigned)
 Advanced Heart Failure Clinic Note    PCP: [facility] (last seen 11/23) Primary Cardiologist: Yvonne Kendall, MD(last seen 07/23)  Chief Complaint: shortness of breath  HPI:  Jorge Cruz is a 73 y/o male with a history of DM, COPD, HTN, nonobstructive CAD, PAF, CKD, tobacco use and chronic heart failure. Had ICD implanted 05/21 monitored through [facility].   Was in the ED 07/23/22 due to an acute cough. Thought to be due to bronchitis.    Admitted 08/09/23 due to shortness of breath and cough for > 1 week. Noted 10 pound weight gain. Admits to dietary indiscretion and inconsistent medication compliance. Initially needed oxygen but able to be weaned off. IV diuresed. Cardiology consulted. Patient reports significant improvement and he was released.   Echo 10/11/19: EF of 30-35% along with trivial Jorge. Echo 07/06/20: EF of 25%. Echo 08/20/21: EF of 30-35%.    Echo 02/08/23: EF 25-30% with mild LVH   He presents today for a HF follow-up visit with a chief complaint of minimal shortness of breath. Chronic in nature but much better since recent admission. Has associated cough, intermittent chest pain (relieved with NTG), headaches and occasional dizziness. Sleeping well flat on stomach without difficulty. Reports a good appetite and says that he's using  Mrs Sharilyn Sites for seasoning.   Smoking 1/2 ppd of cigarettes and drinking 1-2 mixed drinks 4- 5 times / week. Says that his goal for this 2025 year is to quit both tobacco and alcohol.   Used cocaine for ~ 30 years but quit that years ago.   Previous cardiac studies:  Remote treadmill MPI in 03/2016 showed no evidence of perfusion defects with an EF of less than 30% and was overall an intermediate risk study.   Echo 04/01/16: EF of 20-25% along with mild Jorge.  Echo 11/25/17: EF of 20-25% along with a mildly elevated PA pressure of 35 mm Hg.   Cardiac catheterization 06/01/17: EF of 20% along with mild nonobstructive CAD. RHC also done  which showed normal filling pressures, normal pulmonary pressure and moderately reduced cardiac output. Cardiac output was 3.82 with a cardiac index of 1.83.  ROS: All systems negative except as listed in HPI, PMH and Problem List.  SH:  Social History   Socioeconomic History   Marital status: Married    Spouse name: Not on file   Number of children: Not on file   Years of education: Not on file   Highest education level: Not on file  Occupational History   Not on file  Tobacco Use   Smoking status: Every Day    Current packs/day: 0.50    Average packs/day: 0.5 packs/day for 52.0 years (26.0 ttl pk-yrs)    Types: Cigarettes   Smokeless tobacco: Never  Vaping Use   Vaping status: Never Used  Substance and Sexual Activity   Alcohol use: Yes    Alcohol/week: 4.0 - 5.0 standard drinks of alcohol    Types: 1 - 2 Cans of beer, 3 Shots of liquor per week    Comment: occassional drink; past-1/2 to 1 pint liquor a week   Drug use: Not Currently    Types: Cocaine    Comment: 3 years   Sexual activity: Not Currently  Other Topics Concern   Not on file  Social History Narrative   Not on file   Social Drivers of Health   Financial Resource Strain: Low Risk  (08/10/2023)   Overall Financial Resource Strain (CARDIA)    Difficulty of  Paying Living Expenses: Not very hard  Food Insecurity: Not on file  Transportation Needs: No Transportation Needs (08/10/2023)   PRAPARE - Administrator, Civil Service (Medical): No    Lack of Transportation (Non-Medical): No  Physical Activity: Not on file  Stress: Not on file  Social Connections: Not on file  Intimate Partner Violence: Not on file    FH:  Family History  Problem Relation Age of Onset   Diabetes Mother    Diabetes Father     Past Medical History:  Diagnosis Date   CHF (congestive heart failure) (HCC)    Chronic combined systolic (congestive) and diastolic (congestive) heart failure (HCC)    a. 03/2014 Echo: EF  45%, glob HK; b. 03/2016 Echo: EF 20-25%, diff HK, Gr1 DD, mild Jorge; b. 11/2017 Echo: EF 20%, diff HK, Gr2 DD, mildly to mod reduced RV fxn, PASP .   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 03/2014 Echo: EF 45%, glob HK, mild conc LVH; b. 03/2014 MV: possible mild ischemia, superimposed on small inf infarct, EF 36%; c. 03/2016 MV: EF <30%, no ischemia; d. 03/2016 Echo: EF 20-25%, diff HK, Gr1 DD, mild Jorge; e. 05/2017 Cath: nonobs dzs, EF 20%; f. 11/2017 Echo: EF 20%, diff HK, Gr2 DD.   Non-obstructive CAD (coronary artery disease)    a. 05/2017 Cath: LM nl, LAD 9m, LCX 54m, RCA 91m, EF 20%.   Paroxysmal atrial fibrillation (HCC)    Polysubstance abuse (HCC)    Tobacco abuse     Current Outpatient Medications  Medication Sig Dispense Refill   albuterol (PROVENTIL HFA;VENTOLIN HFA) 108 (90 Base) MCG/ACT inhaler Inhale 2 puffs into the lungs every 6 (six) hours as needed for wheezing or shortness of breath.     allopurinol (ZYLOPRIM) 100 MG tablet Take 1 tablet by mouth daily.     apixaban (ELIQUIS) 5 MG TABS tablet Take 1 tablet (5 mg total) by mouth 2 (two) times daily. 180 tablet 1   atorvastatin (LIPITOR) 80 MG tablet Take 1 tablet (80 mg total) by mouth daily. 90 tablet 1   carvedilol (COREG) 6.25 MG tablet Take 1 tablet (6.25 mg total) by mouth 2 (two) times daily. 180 tablet 3   clotrimazole (CLOTRIMAZOLE ANTI-FUNGAL) 1 % cream Apply 1 Application topically 2 (two) times daily. Apply to the foreskin and head of the penis 30 g 0   FARXIGA 10 MG TABS tablet TAKE 1 TABLET (10 MG TOTAL) BY MOUTH DAILY BEFORE BREAKFAST. NEEDS FOLLOW UP APPOINTMENT FOR MORE REFILLS 30 tablet 5   fluticasone  (FLONASE) 50 MCG/ACT nasal spray Place 2 sprays into both nostrils daily.     furosemide (LASIX) 40 MG tablet Take 1 tablet (40 mg total) by mouth daily. 90 tablet 1   guaiFENesin-dextromethorphan (ROBITUSSIN DM) 100-10 MG/5ML syrup Take 5 mLs by mouth every 4 (four) hours as needed for cough. 118 mL 0   hydrocortisone cream 1 % Apply 1 Application topically 2 (two) times daily.     metFORMIN (GLUCOPHAGE) 1000 MG tablet Take 1,000 mg by mouth 2 (two) times daily.     nitroGLYCERIN (NITROSTAT) 0.4 MG SL tablet DISSOLVE 1 TABLET UNDER THE  TONGUE  EVERY 5 MINUTES AS NEEDED FOR CHEST PAIN. MAX OF 3 TABLETS IN 15 MINUTES. CALL 911 IF PAIN  PERSISTS. 25 tablet 0   sacubitril-valsartan (ENTRESTO) 97-103 MG Take 1 tablet by mouth 2 (two) times daily. 180 tablet 3   No current facility-administered medications for this visit.   There were no vitals filed for this visit.  Wt Readings from Last 3 Encounters:  09/12/23 220 lb 3.2 oz (99.9 kg)  08/24/23 219 lb (99.3 kg)  07/21/23 219 lb (99.3 kg)   Lab Results  Component Value Date   CREATININE 1.17 09/04/2023   CREATININE 1.51 (H) 08/24/2023   CREATININE 1.27 (H) 08/11/2023   PHYSICAL EXAM:  General:  Well appearing. No resp difficulty HEENT: normal Neck: supple. JVP flat. No lymphadenopathy or thryomegaly appreciated. Cor: PMI normal. Regular rate & rhythm. No rubs, gallops or murmurs. Lungs: clear Abdomen: soft, nontender, nondistended. No hepatosplenomegaly. No bruits or masses.  Extremities: no cyanosis, clubbing, rash, edema Neuro: alert & oriented x3, cranial nerves grossly intact. Moves all 4 extremities w/o difficulty. Affect pleasant.   ECG: not done   ASSESSMENT & PLAN:  1: NICM with reduced ejection fraction- - likely due to HTN - NYHA class II - euvolemic today - weighing himself daily; reminded to call for an overnight weight gain of >2 pounds or a weekly weight gain of >5 pounds - weight down 14 pounds since he was last  here 7 months ago - Echo 07/06/20: EF of 25%.  - Echo 08/20/21: EF of 30-35%.  - Echo 02/08/23: EF 25-30% with mild LVH  - continue carvedilol 6.25mg  BID - continue farxiga 10mg  daily - continue furosemide 40mg  daily - continue entresto 97/103mg  BID - previously had been on spironolactone but he developed hyperkalemia - had ICD implantation 01/02/20; denies any firing of ICD - using Mrs Sharilyn Sites for seasoning and "doing better" with sodium intake - BNP 08/09/23 was 884.9  2: HTN- - BP 140/68 - sees PCP at [facility] and has to make f/u appointment - BMP 08/11/23 showed sodium 138, potassium 3.9, creatinine 1.27 and GFR >60 - BMET today  3: DM- - A1c on 08/09/23 was 7.2%  4:Tobacco use-  - smoking 1/2-1 ppd of cigarettes & plans to quit this year - explained that there is the availability of a health coach to assist with this when he's ready - has 1-2 mixed drinks 4-5 times/ week and plans to quit this year - complete cessation discussed for 3 minutes with the patient  5: Non-obstructive CAD- - saw cardiology ([place]) 07/23; referral placed today so he can get hospital f/u scheduled - continue atorvastatin 80mg  daily - LDL 09/02/21 was 33 - Cardiac catheterization 06/01/17: EF of 20% along with mild nonobstructive CAD. RHC also done which showed normal filling pressures,  normal pulmonary pressure and moderately reduced cardiac output. Cardiac output was 3.82 with a cardiac index of 1.83.  6: PAF- - continue apixaban 5mg  BID - HR regular today  Return in 1 month, sooner if needed. Emphasized bringing medications to every visit to ensure medication accuracy.      Delma Freeze, FNP 10/17/23

## 2023-10-18 ENCOUNTER — Other Ambulatory Visit (HOSPITAL_COMMUNITY): Payer: Self-pay

## 2023-10-18 ENCOUNTER — Encounter: Payer: Self-pay | Admitting: Family

## 2023-10-18 ENCOUNTER — Telehealth (HOSPITAL_COMMUNITY): Payer: Self-pay | Admitting: Pharmacy Technician

## 2023-10-18 ENCOUNTER — Ambulatory Visit: Payer: 59 | Attending: Family | Admitting: Family

## 2023-10-18 VITALS — BP 113/72 | HR 77 | Wt 218.4 lb

## 2023-10-18 DIAGNOSIS — J449 Chronic obstructive pulmonary disease, unspecified: Secondary | ICD-10-CM | POA: Insufficient documentation

## 2023-10-18 DIAGNOSIS — I428 Other cardiomyopathies: Secondary | ICD-10-CM | POA: Diagnosis present

## 2023-10-18 DIAGNOSIS — I5042 Chronic combined systolic (congestive) and diastolic (congestive) heart failure: Secondary | ICD-10-CM | POA: Insufficient documentation

## 2023-10-18 DIAGNOSIS — Z833 Family history of diabetes mellitus: Secondary | ICD-10-CM | POA: Insufficient documentation

## 2023-10-18 DIAGNOSIS — Z7901 Long term (current) use of anticoagulants: Secondary | ICD-10-CM | POA: Insufficient documentation

## 2023-10-18 DIAGNOSIS — N1832 Chronic kidney disease, stage 3b: Secondary | ICD-10-CM

## 2023-10-18 DIAGNOSIS — E1122 Type 2 diabetes mellitus with diabetic chronic kidney disease: Secondary | ICD-10-CM | POA: Diagnosis not present

## 2023-10-18 DIAGNOSIS — I480 Paroxysmal atrial fibrillation: Secondary | ICD-10-CM | POA: Diagnosis not present

## 2023-10-18 DIAGNOSIS — I2510 Atherosclerotic heart disease of native coronary artery without angina pectoris: Secondary | ICD-10-CM

## 2023-10-18 DIAGNOSIS — I130 Hypertensive heart and chronic kidney disease with heart failure and stage 1 through stage 4 chronic kidney disease, or unspecified chronic kidney disease: Secondary | ICD-10-CM | POA: Diagnosis not present

## 2023-10-18 DIAGNOSIS — F17210 Nicotine dependence, cigarettes, uncomplicated: Secondary | ICD-10-CM | POA: Diagnosis not present

## 2023-10-18 DIAGNOSIS — Z7984 Long term (current) use of oral hypoglycemic drugs: Secondary | ICD-10-CM | POA: Diagnosis not present

## 2023-10-18 DIAGNOSIS — I10 Essential (primary) hypertension: Secondary | ICD-10-CM

## 2023-10-18 DIAGNOSIS — Z95810 Presence of automatic (implantable) cardiac defibrillator: Secondary | ICD-10-CM | POA: Insufficient documentation

## 2023-10-18 DIAGNOSIS — Z720 Tobacco use: Secondary | ICD-10-CM | POA: Diagnosis not present

## 2023-10-18 DIAGNOSIS — N183 Chronic kidney disease, stage 3 unspecified: Secondary | ICD-10-CM | POA: Insufficient documentation

## 2023-10-18 DIAGNOSIS — Z79899 Other long term (current) drug therapy: Secondary | ICD-10-CM | POA: Diagnosis not present

## 2023-10-18 DIAGNOSIS — I5022 Chronic systolic (congestive) heart failure: Secondary | ICD-10-CM | POA: Diagnosis not present

## 2023-10-18 MED ORDER — CARVEDILOL 12.5 MG PO TABS: 12.5000 mg | ORAL_TABLET | Freq: Two times a day (BID) | ORAL | Status: DC

## 2023-10-18 NOTE — Telephone Encounter (Signed)
 Patient Product/process development scientist completed.    The patient is insured through [facility]. Patient has Medicare and is not eligible for a copay card, but may be able to apply for patient assistance or Medicare RX Payment Plan (Patient Must reach out to their plan, if eligible for payment plan), if available.    Ran test claim for Kerendia 10 mg and Requires Prior Authorization   This test claim was processed through [employer]- copay amounts may vary at other pharmacies due to [employer], or as the patient moves through the different stages of their insurance plan.     Roland Earl, CPHT Pharmacy Technician III Certified Patient Advocate [facility] Patient Advocate Team Direct Number: [phone]  Fax: [phone]

## 2023-10-18 NOTE — Patient Instructions (Signed)
 Medication Changes:  INCREASE Carvedilol to 12.5 mg two times daily   Follow-Up in: Please follow up with the Heart Failure Clinic in 1 month.  At the Advanced Heart Failure Clinic, you and your health needs are our priority. We have a designated team specialized in the treatment of Heart Failure. This Care Team includes your primary Heart Failure Specialized Cardiologist (physician), Advanced Practice Providers (APPs- Physician Assistants and Nurse Practitioners), and Pharmacist who all work together to provide you with the care you need, when you need it.   You may see any of the following providers on your designated Care Team at your next follow up:  Dr. Arvilla Meres Dr. Marca Ancona Dr. Dorthula Nettles Dr. Theresia Bough Clarisa Kindred, NP Tonye Becket, NP Robbie Lis, [place] [facility] [place], [place] Brynda Peon, NP [place] Lee, NP Karle Plumber, PharmD   Please be sure to bring in all your medications bottles to every appointment.   Need to Contact us:  If you have any questions or concerns before your next appointment please send [place] a message through [place] or call our office at [phone].    TO LEAVE A MESSAGE FOR THE NURSE SELECT OPTION 2, PLEASE LEAVE A MESSAGE INCLUDING: YOUR NAME DATE OF BIRTH CALL BACK NUMBER REASON FOR CALL**this is important as we prioritize the call backs  YOU WILL RECEIVE A CALL BACK THE SAME DAY AS LONG AS YOU CALL BEFORE 4:00 PM

## 2023-11-09 ENCOUNTER — Other Ambulatory Visit: Payer: Self-pay

## 2023-11-09 DIAGNOSIS — Z122 Encounter for screening for malignant neoplasm of respiratory organs: Secondary | ICD-10-CM

## 2023-11-09 DIAGNOSIS — F17210 Nicotine dependence, cigarettes, uncomplicated: Secondary | ICD-10-CM

## 2023-11-09 DIAGNOSIS — Z87891 Personal history of nicotine dependence: Secondary | ICD-10-CM

## 2023-11-21 ENCOUNTER — Telehealth: Payer: Self-pay | Admitting: Family

## 2023-11-21 NOTE — Telephone Encounter (Incomplete)
 Called to confirm/remind patient of their appointment at the Advanced Heart Failure Clinic on 11/22/23***.   Appointment:   [] Confirmed  [] Left mess   [x] No answer/No voice mail  [] Phone not in service  Patient reminded to bring all medications and/or complete list.  Confirmed patient has transportation. Gave directions, instructed to utilize valet parking.

## 2023-11-22 ENCOUNTER — Encounter: Payer: 59 | Admitting: Family

## 2023-11-22 ENCOUNTER — Telehealth: Payer: Self-pay | Admitting: Family

## 2023-11-22 NOTE — Telephone Encounter (Signed)
 Patient did not show for his Heart Failure Clinic appointment on 11/22/23. This is the 11th appointment he has missed.
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
## 2023-12-12 ENCOUNTER — Ambulatory Visit: Payer: 59 | Attending: Cardiology | Admitting: Cardiology

## 2023-12-12 ENCOUNTER — Encounter: Payer: Self-pay | Admitting: Cardiology

## 2023-12-12 VITALS — BP 120/72 | HR 67 | Ht 68.0 in | Wt 223.0 lb

## 2023-12-12 DIAGNOSIS — N1832 Chronic kidney disease, stage 3b: Secondary | ICD-10-CM

## 2023-12-12 DIAGNOSIS — I428 Other cardiomyopathies: Secondary | ICD-10-CM

## 2023-12-12 DIAGNOSIS — I5022 Chronic systolic (congestive) heart failure: Secondary | ICD-10-CM

## 2023-12-12 DIAGNOSIS — E1122 Type 2 diabetes mellitus with diabetic chronic kidney disease: Secondary | ICD-10-CM

## 2023-12-12 DIAGNOSIS — I10 Essential (primary) hypertension: Secondary | ICD-10-CM

## 2023-12-12 DIAGNOSIS — I480 Paroxysmal atrial fibrillation: Secondary | ICD-10-CM | POA: Diagnosis not present

## 2023-12-12 DIAGNOSIS — E782 Mixed hyperlipidemia: Secondary | ICD-10-CM

## 2023-12-12 DIAGNOSIS — N182 Chronic kidney disease, stage 2 (mild): Secondary | ICD-10-CM

## 2023-12-12 DIAGNOSIS — I2510 Atherosclerotic heart disease of native coronary artery without angina pectoris: Secondary | ICD-10-CM

## 2023-12-12 MED ORDER — FUROSEMIDE 40 MG PO TABS: 40.0000 mg | ORAL_TABLET | Freq: Every day | ORAL | 3 refills | Status: AC

## 2023-12-12 NOTE — Patient Instructions (Signed)
 Medication Instructions:  No changes at this time. Refill has been sent into your pharmacy.   *If you need a refill on your cardiac medications before your next appointment, please call your pharmacy*  Lab Work: BMP, BNP, and CBC  If you have labs (blood work) drawn today and your tests are completely normal, you will receive your results only by: MyChart Message (if you have MyChart) OR A paper copy in the mail If you have any lab test that is abnormal or we need to change your treatment, we will call you to review the results.  Testing/Procedures: None  Follow-Up: At [facility], you and your health needs are our priority.  As part of our continuing mission to provide you with exceptional heart care, our providers are all part of one team.  This team includes your primary Cardiologist (physician) and Advanced Practice Providers or APPs (Physician Assistants and Nurse Practitioners) who all work together to provide you with the care you need, when you need it.  Your next appointment:   2 month(s)  Provider:   Sammy Crisp, MD or Ronald Cockayne, NP

## 2023-12-12 NOTE — Progress Notes (Signed)
 Cardiology Office Note:  .   Date:  12/12/2023  ID:  Jorge Cruz, DOB 08/27/1950, MRN [id number] PCP: [facility]  [facility] Providers Cardiologist:  Sammy Crisp, MD    History of Present Illness: .   Jorge Cruz is a 73 y.o. male with past medical history of nonobstructive coronary disease, chronic HFrEF status post ICD (12/2010), paroxysmal atrial fibrillation, type 2 diabetes, CKD stage III, hypertension, COPD, polysubstance abuse, is here today for follow-up on his HFrEF.   Jorge Cruz underwent remote treadmill MPI in 03/2016 that showed no evidence of perfusion defects with an EF of less than 30% and was overall an intermediate risk study. Blood pressure demonstrated a hypertensive response to exercise. Echo at that time demonstrated an EF of 20 to 25%, grade 1 diastolic dysfunction, mild concentric LVH, and mild mitral regurgitation. Subsequent R/LHC in 05/2017 showed mild nonobstructive CAD as outlined below with an EF of 20%. RHC showed normal filling pressures, normal pulmonary pressure, and moderately reduced cardiac output. Over the years, he has had a persistent cardiomyopathy with echo from 07/2021 demonstrating an EF of 30 to 35%, global hypokinesis, mildly dilated LV internal cavity size, grade 1 diastolic dysfunction, mildly reduced RV systolic function with mildly enlarged ventricular cavity size, no significant valvular abnormalities, and an estimated right atrial pressure of 3 mmHg. He underwent ICD implantation at [facility] in 2021. He has intermittently been followed by both [facility] cardiology, [facility] cardiology, [facility], and the [facility] more recently. Most recent echo from 01/2023 showed an EF of 25-30%, global hypokinesis, mild LVH, low normal RVSF with normal ventricular cavity size, and an estimated right atrial pressure of 3 mmHg.  He was evaluated in the emergency department in late 06/2023 with complaints of cough.  BNP at that  time was 637 with a high-sensitivity troponin peaking at 92.  Chest x-ray was without acute cardiopulmonary process.  He was given DuoNebs advised to take his Lasix  daily he was subsequently discharged home.  He was scheduled outpatient follow-up with advanced heart failure but was a no-show for his appointment 08/07/2023. He was admitted to [facility] 08/09/2023 for 3-year history of shortness of breath, dry cough, and abdominal swelling.  Chest x-ray showed mild cardiomegaly with no active cardiopulmonary process.  High-sensitivity troponin 0.25 with a delta 155, peaking at 159 trending down to 148.  BNP 84, COVID/influenza/RSV SV PCR swab was negative.  He was diuresed and started on GDMT and outpatient appointments were made with advanced heart failure.  He was considered stable for discharge and was discharged home on 08/11/2023.   He was last seen in clinic 09/12/2023 stated overall he had been doing well.  He continued to have occasional shortness of breath.  States that he had been compliant with his current medication regimen.  Medications the same during his appointment.  He was scheduled for ABIs due to complaints of intermittent claudication.  He returns to clinic today stating overall he has been doing well.  He states several days ago he noticed an uptick in his weight but that is since resolved at home.  He states he does have some shortness of breath that he is related since being out of the pollen.  He states that he has been compliant with his current medications.  States that he does continue to follow with advanced heart failure even though it appears as though he missed his last appointment.  He does have issues with pain for some  of his medications.  States that he has not missed any doses of his apixaban  and denies any bleeding noted in his stool or urine.  He has been compliant with his furosemide  and notes a weight loss on his home scale.  Weight gain of 5 pounds on scales clinic today.   Denies any hospitalizations or visits to the emergency department.  ROS: 10 point review of system has been reviewed and considered negative except ones been listed in the HPI  Studies Reviewed: Aaron Aas   EKG Interpretation Date/Time:  Tuesday December 12 2023 09:18:36 EDT Ventricular Rate:  67 PR Interval:  202 QRS Duration:  104 QT Interval:  390 QTC Calculation: 412 R Axis:   2  Text Interpretation: Sinus rhythm with occasional Premature ventricular complexes First degree A-V block Possible Inferior infarct (cited on or before 09-Aug-2023) Possible Anterior infarct (cited on or before 21-Jul-2023) When compared with ECG of 12-Sep-2023 13:28, Premature ventricular complexes are now Present Confirmed by Ronald Cockayne ([id number]) on 12/12/2023 9:24:00 AM    2D echo 02/08/2023 1. Left ventricular ejection fraction, by estimation, is 25 to 30%. The  left ventricle has severely decreased function. The left ventricle  demonstrates global hypokinesis. There is mild left ventricular  hypertrophy. Left ventricular diastolic parameters   are indeterminate.   2. Right ventricular systolic function is low normal. The right  ventricular size is normal.   3. The mitral valve is normal in structure. No evidence of mitral valve  regurgitation.   4. The aortic valve is grossly normal. Aortic valve regurgitation is not  visualized.   5. The inferior vena cava is normal in size with greater than 50%  respiratory variability, suggesting right atrial pressure of 3 mmHg.    2D echo 08/20/2021 1. Left ventricular ejection fraction, by estimation, is 30 to 35%. The  left ventricle has moderate to severely decreased function. The left  ventricle demonstrates global hypokinesis. The left ventricular internal  cavity size was mildly dilated. Left  ventricular diastolic parameters are consistent with Grade I diastolic  dysfunction (impaired relaxation).   2. Right ventricular systolic function is mildly reduced. The  right  ventricular size is mildly enlarged.   3. The mitral valve is normal in structure. No evidence of mitral valve  regurgitation.   4. The aortic valve is tricuspid. Aortic valve regurgitation is not  visualized.   5. The inferior vena cava is normal in size with greater than 50%  respiratory variability, suggesting right atrial pressure of 3 mmHg.   Risk Assessment/Calculations:    CHA2DS2-VASc Score = 4   This indicates a 4.8% annual risk of stroke. The patient's score is based upon: CHF History: 1 HTN History: 1 Diabetes History: 1 Stroke History: 0 Vascular Disease History: 0 Age Score: 1 Gender Score: 0            Physical Exam:   VS:  BP 120/72   Pulse 67   Ht 5\' 8"  (1.727 m)   Wt 223 lb (101.2 kg)   SpO2 98%   BMI 33.91 kg/m    Wt Readings from Last 3 Encounters:  12/12/23 223 lb (101.2 kg)  10/18/23 218 lb 6 oz (99.1 kg)  09/12/23 220 lb 3.2 oz (99.9 kg)    GEN: Well nourished, well developed in no acute distress NECK: No JVD; No carotid bruits CARDIAC: RRR, no murmurs, rubs, gallops RESPIRATORY:  Clear to auscultation without rales, wheezing or rhonchi  ABDOMEN: Soft, non-tender, non-distended EXTREMITIES:  No  edema; No deformity   ASSESSMENT AND PLAN: .   Chronic HFrEF secondary to NICM s/p ICD where he continues to report shortness of breath.  Last echocardiogram completed 02/08/2023 revealed an LVEF of 25 to 30% with mild LVH.  Weight is up 5 pounds today but states he is down 4 pounds on his scale at home back to 214.  He is continued on GDMT of Entresto  97/103 mg twice daily, carvedilol  12.5 mg twice daily, Farxiga  10 mg daily, and furosemide  40 mg daily.  Unfortunately was intolerant of MRA with recurrent hyperkalemia.  He appears euvolemic on exam today.  He has been encouraged to maintain his follow-ups with advanced heart failure as though it looks like he has had several missed appointments.  He has been sent for an updated CBC today, BNP, and BMP.   He also requires a refill of his furosemide  and sent to the pharmacy of choice.  Nonobstructive coronary artery disease where he continues to be chest discomfort.  EKG today reveals sinus rhythm with a rate of 67 first-degree AV block, unifocal PVCs, with old anterior and inferior infarct.  He is continued on apixaban  5 mg twice daily and lieu of aspirin  and atorvastatin  80 mg daily.  Paroxysmal atrial fibrillation rate is maintaining sinus rhythm on EKG today.  He is continued on carvedilol  12.5 mg twice daily and apixaban  5 mg twice daily for CHA2DS2-VASc score of at least 4 for stroke prophylaxis.  He has tolerated anticoagulant well without any concerns for bleeding with no blood noted in his urine or stool.  CKD stage II-III with most recent serum creatinine back to baseline.  With an increase amount of furosemide  due to recent weight gain he has been sent for an updated BMP today.  Primary hypertension with a blood pressure of 120/72.  He is continued on carvedilol , furosemide , and Entresto .  He has also been advised to monitor pressures 1 to 2 hours postmedication administration at home as well.  Mixed hyperlipidemia with updated lipid requested from PCP.  He has been at goal.  Continued on atorvastatin  80 mg daily.  Type 2 diabetes where he is continued on metformin  1000 mg twice daily.  Ongoing management per PCP.       Dispo: Patient return to clinic to see MD/APP in 2 months or sooner if needed for reevaluation of symptoms.  Patient states that if changes need to be made after labs have resulted he will be called the request that text messaging have been sent as he is not always able to answer his telephone.  He has also been encouraged to maintain all of his follow-up appointments with advanced heart failure.  Signed, Devontay Celaya, NP

## 2023-12-13 LAB — CBC
Hematocrit: 46.5 % (ref 37.5–51.0)
Hemoglobin: 14.7 g/dL (ref 13.0–17.7)
MCH: 31.8 pg (ref 26.6–33.0)
MCHC: 31.6 g/dL (ref 31.5–35.7)
MCV: 101 fL — ABNORMAL HIGH (ref 79–97)
Platelets: 176 10*3/uL (ref 150–450)
RBC: 4.62 x10E6/uL (ref 4.14–5.80)
RDW: 13.4 % (ref 11.6–15.4)
WBC: 4.5 10*3/uL (ref 3.4–10.8)

## 2023-12-13 LAB — BASIC METABOLIC PANEL WITH GFR
BUN/Creatinine Ratio: 13 (ref 10–24)
BUN: 16 mg/dL (ref 8–27)
CO2: 24 mmol/L (ref 20–29)
Calcium: 9.7 mg/dL (ref 8.6–10.2)
Chloride: 103 mmol/L (ref 96–106)
Creatinine, Ser: 1.26 mg/dL (ref 0.76–1.27)
Glucose: 114 mg/dL — ABNORMAL HIGH (ref 70–99)
Potassium: 4.6 mmol/L (ref 3.5–5.2)
Sodium: 142 mmol/L (ref 134–144)
eGFR: 61 mL/min/{1.73_m2} (ref >59)

## 2023-12-13 LAB — BRAIN NATRIURETIC PEPTIDE: BNP: 733.5 pg/mL — ABNORMAL HIGH (ref 0.0–100.0)

## 2023-12-14 ENCOUNTER — Emergency Department

## 2023-12-14 ENCOUNTER — Encounter: Payer: Self-pay | Admitting: Emergency Medicine

## 2023-12-14 ENCOUNTER — Emergency Department
Admission: EM | Admit: 2023-12-14 | Discharge: 2023-12-14 | Disposition: A | Discharge status: Home / Self Care | Attending: Emergency Medicine | Admitting: Emergency Medicine

## 2023-12-14 ENCOUNTER — Other Ambulatory Visit: Payer: Self-pay

## 2023-12-14 ENCOUNTER — Encounter: Payer: Self-pay | Admitting: *Deleted

## 2023-12-14 DIAGNOSIS — J441 Chronic obstructive pulmonary disease with (acute) exacerbation: Secondary | ICD-10-CM | POA: Diagnosis not present

## 2023-12-14 DIAGNOSIS — I2510 Atherosclerotic heart disease of native coronary artery without angina pectoris: Secondary | ICD-10-CM | POA: Insufficient documentation

## 2023-12-14 DIAGNOSIS — Z87891 Personal history of nicotine dependence: Secondary | ICD-10-CM | POA: Insufficient documentation

## 2023-12-14 DIAGNOSIS — R0602 Shortness of breath: Secondary | ICD-10-CM

## 2023-12-14 DIAGNOSIS — I509 Heart failure, unspecified: Secondary | ICD-10-CM | POA: Insufficient documentation

## 2023-12-14 DIAGNOSIS — R079 Chest pain, unspecified: Secondary | ICD-10-CM

## 2023-12-14 LAB — BASIC METABOLIC PANEL WITH GFR
Anion gap: 9 (ref 5–15)
BUN: 19 mg/dL (ref 8–23)
CO2: 23 mmol/L (ref 22–32)
Calcium: 9.1 mg/dL (ref 8.9–10.3)
Chloride: 105 mmol/L (ref 98–111)
Creatinine, Ser: 1.36 mg/dL — ABNORMAL HIGH (ref 0.61–1.24)
GFR, Estimated: 55 mL/min — ABNORMAL LOW (ref >60)
Glucose, Bld: 99 mg/dL (ref 70–99)
Potassium: 4.2 mmol/L (ref 3.5–5.1)
Sodium: 137 mmol/L (ref 135–145)

## 2023-12-14 LAB — CBC
HCT: 45.8 % (ref 39.0–52.0)
Hemoglobin: 14.9 g/dL (ref 13.0–17.0)
MCH: 32.3 pg (ref 26.0–34.0)
MCHC: 32.5 g/dL (ref 30.0–36.0)
MCV: 99.1 fL (ref 80.0–100.0)
Platelets: 167 10*3/uL (ref 150–400)
RBC: 4.62 MIL/uL (ref 4.22–5.81)
RDW: 14.9 % (ref 11.5–15.5)
WBC: 4.6 10*3/uL (ref 4.0–10.5)
nRBC: 0 % (ref 0.0–0.2)

## 2023-12-14 LAB — TROPONIN I (HIGH SENSITIVITY): Troponin I (High Sensitivity): 34 ng/L — ABNORMAL HIGH (ref <18)

## 2023-12-14 MED ORDER — PREDNISONE 10 MG (21) PO TBPK: ORAL_TABLET | ORAL | 0 refills | Status: DC

## 2023-12-14 NOTE — ED Triage Notes (Signed)
 Pt to ED via POV c/o chest pain x 2 days. Pt states that the pain is on the left side over the area where his defibrillator is implanted. Pt states that he has had the defibrillator x 2 years, Pt denies defibrillator going off. Pt states that the pain was worse last night when laying down. Pt is also c/o increased shob and fatigue. Pt reports that he does have COPD. Pt is a smoker. Pt is currently in NAD.

## 2023-12-14 NOTE — ED Provider Notes (Signed)
 [facility] Provider Note   Event Date/Time   First MD Initiated Contact with Patient 12/14/23 1212     (approximate) History  Chest Pain  HPI Jorge Cruz is a 73 y.o. male with a past medical history of COPD with continued tobacco abuse, CAD, heart failure, and hyperlipidemia who presents complaining of shortness of breath with associated chest pain over the last 2 days.  Patient states that the pain in his chest is worse overlying his defibrillator site on the left upper chest wall.  Patient does endorse pain with taking a deep breath.  Patient denies any exertional worsening of this pain.  Patient does state that this pain is worse whenever he forgets to take his allergy medicine daily. ROS: Patient currently denies any vision changes, tinnitus, difficulty speaking, facial droop, sore throat, abdominal pain, nausea/vomiting/diarrhea, dysuria, or weakness/numbness/paresthesias in any extremity   Physical Exam  Triage Vital Signs: ED Triage Vitals [12/14/23 1024]  Encounter Vitals Group     BP (!) 111/96     Systolic BP Percentile      Diastolic BP Percentile      Pulse Rate 71     Resp 16     Temp 97.9 F (36.6 C)     Temp Source Oral     SpO2 99 %     Weight 216 lb (98 kg)     Height 5\' 8"  (1.727 m)     Head Circumference      Peak Flow      Pain Score 7     Pain Loc      Pain Education      Exclude from Growth Chart    Most recent vital signs: Vitals:   12/14/23 1024  BP: (!) 111/96  Pulse: 71  Resp: 16  Temp: 97.9 F (36.6 C)  SpO2: 99%   General: Awake, oriented x4. CV:  Good peripheral perfusion.  Resp:  Normal effort.  Expiratory wheezing over bilateral lung fields Abd:  No distention.  Other:  Elderly obese African-American male resting comfortably in no acute distress ED Results / Procedures / Treatments  Labs (all labs ordered are listed, but only abnormal results are displayed) Labs Reviewed  BASIC METABOLIC PANEL WITH GFR -  Abnormal; Notable for the following components:      Result Value   Creatinine, Ser 1.36 (*)    GFR, Estimated 55 (*)    All other components within normal limits  TROPONIN I (HIGH SENSITIVITY) - Abnormal; Notable for the following components:   Troponin I (High Sensitivity) 34 (*)    All other components within normal limits  CBC  TROPONIN I (HIGH SENSITIVITY)   EKG ED ECG REPORT I, Charleen Conn, the attending physician, personally viewed and interpreted this ECG. Date: 12/14/2023 EKG Time: 1019 Rate: 68 Rhythm: normal sinus rhythm QRS Axis: normal Intervals: normal ST/T Wave abnormalities: normal Narrative Interpretation: no evidence of acute ischemia RADIOLOGY ED MD interpretation: 2 view chest x-ray interpreted by me shows no evidence of acute abnormalities including no pneumonia, pneumothorax, or widened mediastinum -Agree with radiology assessment Official radiology report(s): DG Chest 2 View Result Date: 12/14/2023 CLINICAL DATA:  Chest pain EXAM: CHEST - 2 VIEW COMPARISON:  August 09, 2023 FINDINGS: The heart size and mediastinal contours are within normal limits. Both lungs are clear. The visualized skeletal structures are unremarkable. No change in the left subclavian bipolar ICDF pacemaker device tip of the leads in good position no evidence of  discontinuity IMPRESSION: No active cardiopulmonary disease. Electronically Signed   By: Fredrich Jefferson M.D.   On: 12/14/2023 10:46   PROCEDURES: Critical Care performed: No Procedures MEDICATIONS ORDERED IN ED: Medications - No data to display IMPRESSION / MDM / ASSESSMENT AND PLAN / ED COURSE  I reviewed the triage vital signs and the nursing notes.                             The patient is on the cardiac monitor to evaluate for evidence of arrhythmia and/or significant heart rate changes. Patient's presentation is most consistent with acute presentation with potential threat to life or bodily function. The patient  appears to be suffering from a mild exacerbation of COPD.  Based on the history, exam, CXR/EKG, and further workup I don't suspect any other emergent cause of this presentation, such as pneumonia, acute coronary syndrome, congestive heart failure, pulmonary embolism, or pneumothorax.  Rx: Steroids as needed after trying his allergy medications Disposition: Discharge home with SRP. PCP follow up recommended in next 48hours.   FINAL CLINICAL IMPRESSION(S) / ED DIAGNOSES   Final diagnoses:  Left-sided chest pain  Shortness of breath  COPD with exacerbation (HCC)   Rx / DC Orders   ED Discharge Orders          Ordered    predniSONE  (STERAPRED UNI-PAK 21 TAB) 10 MG (21) TBPK tablet        12/14/23 1228           Note:  This document was prepared using Dragon voice recognition software and may include unintentional dictation errors.   Kolette Vey K, MD 12/14/23 [phone]

## 2023-12-14 NOTE — Progress Notes (Signed)
 Blood counts and kidney function have remained stable.  BNP 733 which is slightly improved from resolved from 4 months prior where it was over 800.  Continued on diuretic therapy as previously ordered.  No changes needed to be made to his current medication regimen at this time.

## 2023-12-14 NOTE — Discharge Instructions (Addendum)
 Please start taking your home allergy medication on time and as prescribed.  If these medications are not improving your symptoms in the next 3 days, please use the prescribed prednisone  taper

## 2023-12-25 ENCOUNTER — Encounter: Payer: Self-pay | Admitting: Emergency Medicine

## 2023-12-25 ENCOUNTER — Inpatient Hospital Stay
Admission: EM | Admit: 2023-12-25 | Discharge: 2023-12-28 | DRG: 291 | Disposition: A | Discharge status: Home / Self Care | Attending: Internal Medicine | Admitting: Internal Medicine

## 2023-12-25 ENCOUNTER — Emergency Department
Admission: EM | Admit: 2023-12-25 | Discharge: 2023-12-25 | Disposition: A | Discharge status: Home / Self Care | Source: Home / Self Care | Attending: Emergency Medicine | Admitting: Emergency Medicine

## 2023-12-25 ENCOUNTER — Other Ambulatory Visit: Payer: Self-pay

## 2023-12-25 ENCOUNTER — Emergency Department

## 2023-12-25 DIAGNOSIS — K5900 Constipation, unspecified: Secondary | ICD-10-CM | POA: Diagnosis present

## 2023-12-25 DIAGNOSIS — N1831 Chronic kidney disease, stage 3a: Secondary | ICD-10-CM | POA: Diagnosis present

## 2023-12-25 DIAGNOSIS — E1122 Type 2 diabetes mellitus with diabetic chronic kidney disease: Secondary | ICD-10-CM | POA: Insufficient documentation

## 2023-12-25 DIAGNOSIS — I5043 Acute on chronic combined systolic (congestive) and diastolic (congestive) heart failure: Secondary | ICD-10-CM | POA: Diagnosis present

## 2023-12-25 DIAGNOSIS — Z1152 Encounter for screening for COVID-19: Secondary | ICD-10-CM | POA: Diagnosis not present

## 2023-12-25 DIAGNOSIS — J209 Acute bronchitis, unspecified: Secondary | ICD-10-CM | POA: Insufficient documentation

## 2023-12-25 DIAGNOSIS — I130 Hypertensive heart and chronic kidney disease with heart failure and stage 1 through stage 4 chronic kidney disease, or unspecified chronic kidney disease: Principal | ICD-10-CM | POA: Diagnosis present

## 2023-12-25 DIAGNOSIS — J9602 Acute respiratory failure with hypercapnia: Secondary | ICD-10-CM | POA: Diagnosis present

## 2023-12-25 DIAGNOSIS — I2510 Atherosclerotic heart disease of native coronary artery without angina pectoris: Secondary | ICD-10-CM | POA: Diagnosis present

## 2023-12-25 DIAGNOSIS — I252 Old myocardial infarction: Secondary | ICD-10-CM

## 2023-12-25 DIAGNOSIS — Z716 Tobacco abuse counseling: Secondary | ICD-10-CM

## 2023-12-25 DIAGNOSIS — J441 Chronic obstructive pulmonary disease with (acute) exacerbation: Secondary | ICD-10-CM | POA: Diagnosis not present

## 2023-12-25 DIAGNOSIS — Z7901 Long term (current) use of anticoagulants: Secondary | ICD-10-CM | POA: Diagnosis not present

## 2023-12-25 DIAGNOSIS — J449 Chronic obstructive pulmonary disease, unspecified: Secondary | ICD-10-CM | POA: Insufficient documentation

## 2023-12-25 DIAGNOSIS — N189 Chronic kidney disease, unspecified: Secondary | ICD-10-CM | POA: Insufficient documentation

## 2023-12-25 DIAGNOSIS — I5020 Unspecified systolic (congestive) heart failure: Secondary | ICD-10-CM | POA: Insufficient documentation

## 2023-12-25 DIAGNOSIS — I2489 Other forms of acute ischemic heart disease: Secondary | ICD-10-CM | POA: Diagnosis present

## 2023-12-25 DIAGNOSIS — E1159 Type 2 diabetes mellitus with other circulatory complications: Secondary | ICD-10-CM | POA: Diagnosis not present

## 2023-12-25 DIAGNOSIS — Z7984 Long term (current) use of oral hypoglycemic drugs: Secondary | ICD-10-CM | POA: Diagnosis not present

## 2023-12-25 DIAGNOSIS — Z79899 Other long term (current) drug therapy: Secondary | ICD-10-CM

## 2023-12-25 DIAGNOSIS — F17210 Nicotine dependence, cigarettes, uncomplicated: Secondary | ICD-10-CM | POA: Diagnosis present

## 2023-12-25 DIAGNOSIS — I428 Other cardiomyopathies: Secondary | ICD-10-CM | POA: Diagnosis present

## 2023-12-25 DIAGNOSIS — I10 Essential (primary) hypertension: Secondary | ICD-10-CM | POA: Diagnosis present

## 2023-12-25 DIAGNOSIS — I480 Paroxysmal atrial fibrillation: Secondary | ICD-10-CM | POA: Diagnosis present

## 2023-12-25 DIAGNOSIS — Z6833 Body mass index (BMI) 33.0-33.9, adult: Secondary | ICD-10-CM | POA: Diagnosis not present

## 2023-12-25 DIAGNOSIS — J069 Acute upper respiratory infection, unspecified: Secondary | ICD-10-CM | POA: Insufficient documentation

## 2023-12-25 DIAGNOSIS — I5023 Acute on chronic systolic (congestive) heart failure: Secondary | ICD-10-CM | POA: Diagnosis present

## 2023-12-25 DIAGNOSIS — E785 Hyperlipidemia, unspecified: Secondary | ICD-10-CM | POA: Diagnosis present

## 2023-12-25 DIAGNOSIS — Z888 Allergy status to other drugs, medicaments and biological substances status: Secondary | ICD-10-CM | POA: Diagnosis not present

## 2023-12-25 DIAGNOSIS — E66811 Obesity, class 1: Secondary | ICD-10-CM | POA: Diagnosis present

## 2023-12-25 DIAGNOSIS — E8729 Other acidosis: Secondary | ICD-10-CM | POA: Diagnosis not present

## 2023-12-25 DIAGNOSIS — Z794 Long term (current) use of insulin: Secondary | ICD-10-CM

## 2023-12-25 DIAGNOSIS — Z91199 Patient's noncompliance with other medical treatment and regimen due to unspecified reason: Secondary | ICD-10-CM

## 2023-12-25 DIAGNOSIS — Z833 Family history of diabetes mellitus: Secondary | ICD-10-CM

## 2023-12-25 DIAGNOSIS — Z95810 Presence of automatic (implantable) cardiac defibrillator: Secondary | ICD-10-CM

## 2023-12-25 DIAGNOSIS — J9601 Acute respiratory failure with hypoxia: Principal | ICD-10-CM | POA: Diagnosis present

## 2023-12-25 DIAGNOSIS — F17200 Nicotine dependence, unspecified, uncomplicated: Secondary | ICD-10-CM | POA: Diagnosis present

## 2023-12-25 DIAGNOSIS — I509 Heart failure, unspecified: Secondary | ICD-10-CM

## 2023-12-25 DIAGNOSIS — E119 Type 2 diabetes mellitus without complications: Secondary | ICD-10-CM

## 2023-12-25 LAB — CBC WITH DIFFERENTIAL/PLATELET
Abs Immature Granulocytes: 0.01 10*3/uL (ref 0.00–0.07)
Abs Immature Granulocytes: 0.03 10*3/uL (ref 0.00–0.07)
Basophils Absolute: 0 10*3/uL (ref 0.0–0.1)
Basophils Absolute: 0 10*3/uL (ref 0.0–0.1)
Basophils Relative: 0 %
Basophils Relative: 1 %
Eosinophils Absolute: 0.1 10*3/uL (ref 0.0–0.5)
Eosinophils Absolute: 0.2 10*3/uL (ref 0.0–0.5)
Eosinophils Relative: 1 %
Eosinophils Relative: 3 %
HCT: 42.8 % (ref 39.0–52.0)
HCT: 43.4 % (ref 39.0–52.0)
Hemoglobin: 13.7 g/dL (ref 13.0–17.0)
Hemoglobin: 14.2 g/dL (ref 13.0–17.0)
Immature Granulocytes: 0 %
Immature Granulocytes: 0 %
Lymphocytes Relative: 10 %
Lymphocytes Relative: 34 %
Lymphs Abs: 0.9 10*3/uL (ref 0.7–4.0)
Lymphs Abs: 1.6 10*3/uL (ref 0.7–4.0)
MCH: 32.1 pg (ref 26.0–34.0)
MCH: 32.3 pg (ref 26.0–34.0)
MCHC: 32 g/dL (ref 30.0–36.0)
MCHC: 32.7 g/dL (ref 30.0–36.0)
MCV: 100.2 fL — ABNORMAL HIGH (ref 80.0–100.0)
MCV: 98.6 fL (ref 80.0–100.0)
Monocytes Absolute: 0.6 10*3/uL (ref 0.1–1.0)
Monocytes Absolute: 0.7 10*3/uL (ref 0.1–1.0)
Monocytes Relative: 14 %
Monocytes Relative: 7 %
Neutro Abs: 2.3 10*3/uL (ref 1.7–7.7)
Neutro Abs: 6.9 10*3/uL (ref 1.7–7.7)
Neutrophils Relative %: 48 %
Neutrophils Relative %: 82 %
Platelets: 160 10*3/uL (ref 150–400)
Platelets: 178 10*3/uL (ref 150–400)
RBC: 4.27 MIL/uL (ref 4.22–5.81)
RBC: 4.4 MIL/uL (ref 4.22–5.81)
RDW: 14.7 % (ref 11.5–15.5)
RDW: 14.8 % (ref 11.5–15.5)
WBC: 4.7 10*3/uL (ref 4.0–10.5)
WBC: 8.5 10*3/uL (ref 4.0–10.5)
nRBC: 0 % (ref 0.0–0.2)
nRBC: 0 % (ref 0.0–0.2)

## 2023-12-25 LAB — COMPREHENSIVE METABOLIC PANEL WITH GFR
ALT: 14 U/L (ref 0–44)
AST: 20 U/L (ref 15–41)
Albumin: 3.7 g/dL (ref 3.5–5.0)
Alkaline Phosphatase: 70 U/L (ref 38–126)
Anion gap: 8 (ref 5–15)
BUN: 17 mg/dL (ref 8–23)
CO2: 27 mmol/L (ref 22–32)
Calcium: 8.7 mg/dL — ABNORMAL LOW (ref 8.9–10.3)
Chloride: 105 mmol/L (ref 98–111)
Creatinine, Ser: 0.98 mg/dL (ref 0.61–1.24)
GFR, Estimated: >60 mL/min (ref >60)
Glucose, Bld: 162 mg/dL — ABNORMAL HIGH (ref 70–99)
Potassium: 3.8 mmol/L (ref 3.5–5.1)
Sodium: 140 mmol/L (ref 135–145)
Total Bilirubin: 0.7 mg/dL (ref 0.0–1.2)
Total Protein: 6.8 g/dL (ref 6.5–8.1)

## 2023-12-25 LAB — RESP PANEL BY RT-PCR (RSV, FLU A&B, COVID)  RVPGX2
Influenza A by PCR: NEGATIVE
Influenza B by PCR: NEGATIVE
Resp Syncytial Virus by PCR: NEGATIVE
SARS Coronavirus 2 by RT PCR: NEGATIVE

## 2023-12-25 LAB — TROPONIN I (HIGH SENSITIVITY)
Troponin I (High Sensitivity): 81 ng/L — ABNORMAL HIGH (ref <18)
Troponin I (High Sensitivity): 73 ng/L — ABNORMAL HIGH (ref <18)

## 2023-12-25 LAB — BLOOD GAS, VENOUS

## 2023-12-25 MED ORDER — IPRATROPIUM-ALBUTEROL 0.5-2.5 (3) MG/3ML IN SOLN
3.0000 mL | Freq: Once | RESPIRATORY_TRACT | Status: AC
  Administered 2023-12-25: 3 mL via RESPIRATORY_TRACT
  Filled 2023-12-25: qty 3

## 2023-12-25 MED ORDER — ALLOPURINOL 100 MG PO TABS
100.0000 mg | ORAL_TABLET | Freq: Every day | ORAL | Status: DC
  Administered 2023-12-26 – 2023-12-28 (×3): 100 mg via ORAL
  Filled 2023-12-25 (×4): qty 1

## 2023-12-25 MED ORDER — APIXABAN 5 MG PO TABS
5.0000 mg | ORAL_TABLET | Freq: Two times a day (BID) | ORAL | Status: DC
  Administered 2023-12-26 – 2023-12-28 (×6): 5 mg via ORAL
  Filled 2023-12-25 (×6): qty 1

## 2023-12-25 MED ORDER — ONDANSETRON HCL 4 MG/2ML IJ SOLN: 4.0000 mg | Freq: Four times a day (QID) | INTRAMUSCULAR | Status: DC | PRN

## 2023-12-25 MED ORDER — SACUBITRIL-VALSARTAN 97-103 MG PO TABS
1.0000 | ORAL_TABLET | Freq: Two times a day (BID) | ORAL | Status: DC
Start: 2023-12-26 — End: 2023-12-28
  Administered 2023-12-26 – 2023-12-28 (×5): 1 via ORAL
  Filled 2023-12-25 (×5): qty 1

## 2023-12-25 MED ORDER — FUROSEMIDE 10 MG/ML IJ SOLN
40.0000 mg | Freq: Once | INTRAMUSCULAR | Status: AC
  Administered 2023-12-25: 40 mg via INTRAVENOUS
  Filled 2023-12-25: qty 4

## 2023-12-25 MED ORDER — IPRATROPIUM-ALBUTEROL 0.5-2.5 (3) MG/3ML IN SOLN
3.0000 mL | Freq: Four times a day (QID) | RESPIRATORY_TRACT | Status: DC
Start: 2023-12-26 — End: 2023-12-27
  Administered 2023-12-26 – 2023-12-27 (×7): 3 mL via RESPIRATORY_TRACT
  Filled 2023-12-25 (×7): qty 3

## 2023-12-25 MED ORDER — FUROSEMIDE 10 MG/ML IJ SOLN
40.0000 mg | Freq: Two times a day (BID) | INTRAMUSCULAR | Status: DC
  Administered 2023-12-26 – 2023-12-27 (×3): 40 mg via INTRAVENOUS
  Filled 2023-12-25 (×3): qty 4

## 2023-12-25 MED ORDER — ACETAMINOPHEN 325 MG PO TABS
650.0000 mg | ORAL_TABLET | ORAL | Status: DC | PRN
  Administered 2023-12-28: 650 mg via ORAL
  Filled 2023-12-25 (×2): qty 2

## 2023-12-25 MED ORDER — BENZONATATE 200 MG PO CAPS: 200.0000 mg | ORAL_CAPSULE | Freq: Three times a day (TID) | ORAL | 0 refills | Status: AC | PRN

## 2023-12-25 MED ORDER — ATORVASTATIN CALCIUM 80 MG PO TABS
80.0000 mg | ORAL_TABLET | Freq: Every day | ORAL | Status: DC
  Administered 2023-12-26 – 2023-12-28 (×3): 80 mg via ORAL
  Filled 2023-12-25: qty 1
  Filled 2023-12-25: qty 4
  Filled 2023-12-25: qty 1

## 2023-12-25 MED ORDER — SODIUM CHLORIDE 0.9 % IV SOLN: 250.0000 mL | INTRAVENOUS | Status: AC | PRN

## 2023-12-25 MED ORDER — SODIUM CHLORIDE 0.9% FLUSH: 3.0000 mL | INTRAVENOUS | Status: DC | PRN

## 2023-12-25 MED ORDER — SODIUM CHLORIDE 0.9% FLUSH
3.0000 mL | Freq: Two times a day (BID) | INTRAVENOUS | Status: DC
  Administered 2023-12-25 – 2023-12-28 (×6): 3 mL via INTRAVENOUS

## 2023-12-25 MED ORDER — PREDNISONE 20 MG PO TABS
40.0000 mg | ORAL_TABLET | Freq: Every day | ORAL | Status: DC
  Administered 2023-12-26 – 2023-12-28 (×3): 40 mg via ORAL
  Filled 2023-12-25 (×3): qty 2

## 2023-12-25 MED ORDER — CARVEDILOL 12.5 MG PO TABS
12.5000 mg | ORAL_TABLET | Freq: Two times a day (BID) | ORAL | Status: DC
  Administered 2023-12-26 – 2023-12-28 (×5): 12.5 mg via ORAL
  Filled 2023-12-25 (×2): qty 1
  Filled 2023-12-25: qty 2
  Filled 2023-12-25: qty 1
  Filled 2023-12-25: qty 2

## 2023-12-25 MED ORDER — DOXYCYCLINE HYCLATE 100 MG PO CAPS: 100.0000 mg | ORAL_CAPSULE | Freq: Two times a day (BID) | ORAL | 0 refills | Status: DC

## 2023-12-25 MED ORDER — INSULIN ASPART 100 UNIT/ML IJ SOLN
0.0000 [IU] | Freq: Three times a day (TID) | INTRAMUSCULAR | Status: DC
  Administered 2023-12-26: 3 [IU] via SUBCUTANEOUS
  Administered 2023-12-26: 8 [IU] via SUBCUTANEOUS
  Administered 2023-12-26 – 2023-12-27 (×3): 5 [IU] via SUBCUTANEOUS
  Administered 2023-12-27: 2 [IU] via SUBCUTANEOUS
  Administered 2023-12-28: 3 [IU] via SUBCUTANEOUS
  Filled 2023-12-25 (×7): qty 1

## 2023-12-25 MED ORDER — BENZONATATE 100 MG PO CAPS
200.0000 mg | ORAL_CAPSULE | Freq: Once | ORAL | Status: AC
  Administered 2023-12-25: 200 mg via ORAL
  Filled 2023-12-25: qty 2

## 2023-12-25 NOTE — Assessment & Plan Note (Signed)
 On admission. On EMS arrival, patient was notably hypertensive above 200 systolics.  Nitropaste was placed in blood pressure has improved close to baseline at this time.  Unclear if hypertensive emergency was contributing to rapid pulmonary edema earlier today. - Hold off on nitroglycerin  infusion at this time given improvement in blood pressure - Continue home regimen  12-27-2023 BP improved with treatment of his COPD exacerbation.  12-28-2023 Stable. DC to home

## 2023-12-25 NOTE — ED Notes (Signed)
 Charge RN was not able to get a successful blood draw. Lab was called to come draw pt's labs.

## 2023-12-25 NOTE — ED Notes (Signed)
 See triage notes. Patient c/o cough times five days. Patient was previously on a steroid for a cough and stated it seemed to slow things down but as soon as he finished the steroid, the cough returned.

## 2023-12-25 NOTE — ED Provider Notes (Signed)
 Shared visit   Patient presented to the emergency department with ongoing cough and congestion with sputum production for the past 5 days.  Denies any chest pain or significant shortness of breath.  Has been noncompliant not taking his medications correctly.  Has been a no-show for multiple outpatient visits with heart failure clinic.  Initial troponin mildly elevated and repeat downtrending, low suspicion for ACS.  No shortness of breath or chest pain at this time.  Will do treatment for COPD exacerbation.  Encouraged to take his Lasix  as prescribed.  Encouraged on Lasix  40 mg twice daily for 2 days and then 40 mg daily.  Discussed treatment for COPD and return precautions.  Encouraged to stop smoking and follow-up with his outpatient follow-up with visits.  Given return precautions for any ongoing or worsening symptoms.   Viviano Ground, MD 12/25/23 1318

## 2023-12-25 NOTE — ED Triage Notes (Signed)
 Pt arrived via [facility] from home with respiratory distress since . Initial oxygen saturation in low 70's, pt placed on CPAP with increase to 94%. Pt tachypnic and hypertensive in route. EMS placed 1.5 in Nitroglycerin  on left side of chest, administered 2mg  IV mag, 12mg  IV Solumedrol, gave 2 Duo-nebs and 2 Albuterol  treatments. Respiratory in room on pt arrival for BiPAP initiation.

## 2023-12-25 NOTE — Assessment & Plan Note (Signed)
 On admission. Currently in sinus rhythm on EKG. - Continue home regimen  12-27-2023 stable.  12-28-2023 Stable. DC to home

## 2023-12-25 NOTE — Assessment & Plan Note (Signed)
 On admission. History of well-controlled type 2 diabetes with last A1c of 7.2% approximately 4 months ago. - Hold home metformin  - SSI, moderate  12-27-2023 stable. Continue SSI  12-28-2023 Stable. DC to home

## 2023-12-25 NOTE — ED Provider Notes (Signed)
 [facility] Provider Note    Event Date/Time   First MD Initiated Contact with Patient 12/25/23 [phone]     (approximate)   History   Cough   HPI  Jorge Cruz is a 73 y.o. male   presents to the ED with complaint of productive cough for the 5 days.  Patient is unaware of any fever.  He has been taking over-the-counter cough medication without any relief.  He also uses an inhaler for COPD.  He used 1/2 pack of cigarettes per day.  He is not aware of any sick contacts.  Patient has history of heart failure with reduced ejection fraction, diabetes, COPD, hypertension, non-STEMI MI, CAD, CKD, nonischemic cardiomyopathy, paroxysmal atrial fibrillation, CHF and polysubstance abuse.      Physical Exam   Triage Vital Signs: ED Triage Vitals  Encounter Vitals Group     BP 12/25/23 0934 138/77     Systolic BP Percentile --      Diastolic BP Percentile --      Pulse Rate 12/25/23 0934 82     Resp 12/25/23 0934 18     Temp 12/25/23 0934 97.9 F (36.6 C)     Temp Source 12/25/23 0934 Oral     SpO2 12/25/23 0934 100 %     Weight 12/25/23 0935 216 lb (98 kg)     Height 12/25/23 0935 5\' 8"  (1.727 m)     Head Circumference --      Peak Flow --      Pain Score 12/25/23 0934 7     Pain Loc --      Pain Education --      Exclude from Growth Chart --     Most recent vital signs: Vitals:   12/25/23 0934  BP: 138/77  Pulse: 82  Resp: 18  Temp: 97.9 F (36.6 C)  SpO2: 100%     General: Awake, no distress.  Pleasant, alert, able to talk in complete sentences without difficulty.  Shortness of breath is observed. CV:  Good peripheral perfusion.  Heart regular rate and rhythm. Resp:  Normal effort.  No rales, rhonchi or wheezing was noted.  Patient does have a very congested cough that was noted during exam. Abd:  No distention.  Other:  Lower extremities without pitting edema.  Patient able to stand and ambulate without any assistance.   ED Results /  Procedures / Treatments   Labs (all labs ordered are listed, but only abnormal results are displayed) Labs Reviewed  COMPREHENSIVE METABOLIC PANEL WITH GFR - Abnormal; Notable for the following components:      Result Value   Glucose, Bld 162 (*)    Calcium  8.7 (*)    All other components within normal limits  TROPONIN I (HIGH SENSITIVITY) - Abnormal; Notable for the following components:   Troponin I (High Sensitivity) 81 (*)    All other components within normal limits  TROPONIN I (HIGH SENSITIVITY) - Abnormal; Notable for the following components:   Troponin I (High Sensitivity) 73 (*)    All other components within normal limits  RESP PANEL BY RT-PCR (RSV, FLU A&B, COVID)  RVPGX2  CBC WITH DIFFERENTIAL/PLATELET     EKG  Sinus rhythm with Premature atrial complexes Cannot rule out Inferior infarct , age undetermined Possible Anterior infarct (cited on or before 21-Jul-2023) Abnormal ECG When compared with ECG of 14-Dec-2023 10:19, Premature atrial complexes are now Present Inverted T waves have replaced nonspecific T wave abnormality in Lateral  leads   RADIOLOGY  Chest x-ray per radiology is negative for effusion or pneumothorax.  Reported enlarged heart.     PROCEDURES:  Critical Care performed:   Procedures   MEDICATIONS ORDERED IN ED: Medications  ipratropium-albuterol  (DUONEB) 0.5-2.5 (3) MG/3ML nebulizer solution 3 mL (3 mLs Nebulization Given 12/25/23 1221)  benzonatate  (TESSALON ) capsule 200 mg (200 mg Oral Given 12/25/23 1227)     IMPRESSION / MDM / ASSESSMENT AND PLAN / ED COURSE  I reviewed the triage vital signs and the nursing notes.   Differential diagnosis includes, but is not limited to, COVID, influenza, RSV, CHF, bronchitis, pneumonia, exacerbation of COPD, exacerbation of CHF, viral URI.  72 year old male presents to the ED with complaint of cough for the last 5 days with no relief with over-the-counter medications.  Patient continues to smoke  1/2 pack cigarettes per day and use his albuterol  inhaler which she has at home.  Respiratory panel was negative, CBC and CMP unremarkable.  Because of patient's cardiac history troponins were ordered with the first being 81 and the second being 73.  Review of his past history shows a troponin of 159,  4 months ago when he was being seen in the emergency department.  I spoke with Dr. Dodson Freestone about these findings.  Chest x-ray was reassuring and x-rays also mention cardiomegaly.  I spoke with patient who continues using his albuterol  inhaler at home.  He states that the steroids that he was given prior to this visit did not help.  Tessalon  Perle was given to him while in the ED.  With discussing the plan with Dr. Dodson Freestone it was decided that patient will continue his regular medication and also for the next couple of days take an extra Lasix  which he has at home.  A prescription for doxycycline  and Tessalon  Perles was sent to the pharmacy.  He strongly encouraged follow-up with his PCP and also continue seeing Shawnee Dellen in the congestive heart failure clinic at [facility].  Patient was strongly encouraged to return to the emergency department immediately if any sudden worsening of his symptoms such as difficulty breathing, shortness of breath or chest pain.    Clinical Course as of 12/25/23 1527  Mon Dec 25, 2023  1239 Troponin I (High Sensitivity)(!): 73 [RS]  1239 Troponin I (High Sensitivity)(!) [RS]    Clinical Course User Index [RS] Stafford Eagles, PA-C   Patient's presentation is most consistent with acute illness / injury with system symptoms.  FINAL CLINICAL IMPRESSION(S) / ED DIAGNOSES   Final diagnoses:  Bronchitis with bronchospasm  Viral URI with cough     Rx / DC Orders   ED Discharge Orders          Ordered    doxycycline  (VIBRAMYCIN ) 100 MG capsule  2 times daily        12/25/23 1248    benzonatate  (TESSALON ) 200 MG capsule  3 times daily PRN        12/25/23 1248              Note:  This document was prepared using Dragon voice recognition software and may include unintentional dictation errors.   Stafford Eagles, PA-C 12/25/23 1527    Viviano Ground, MD 12/26/23 [phone]

## 2023-12-25 NOTE — ED Provider Notes (Signed)
 [facility] Provider Note    Event Date/Time   First MD Initiated Contact with Patient 12/25/23 2228     (approximate)   History   Respiratory Distress  Pt arrived via [facility] from home with respiratory distress since . Initial oxygen saturation in low 70's, pt placed on CPAP with increase to 94%. Pt tachypnic and hypertensive in route. EMS placed 1.5 in Nitroglycerin  on left side of chest, administered 2mg  IV mag, 12mg  IV Solumedrol, gave 2 Duo-nebs and 2 Albuterol  treatments. Respiratory in room on pt arrival for BiPAP initiation.    HPI Jorge Cruz is a 73 y.o. male PMH COPD, HFrEF, hyperlipidemia, hypertension presents for evaluation of shortness of breath -Per EMS, satting 70s on room air, wheezy throughout with diminished airflow.  Received 2 g of magnesium, 125 mg methylprednisolone , started on positive pressure ventilation.  Also hypertensive with a systolic in the low 200s, given single dose of Nitropaste.  Notably improved en route - Per chart review, seen in our emergency department earlier this morning.  Troponin stable and at baseline.  Viral panel negative.  Chest x-ray with no overt edema and no evidence of pneumonia.  Had recently been treated for COPD exacerbation on 12/14/2023 and given steroids at that time.  Treated with doxycycline  earlier today. -Complains of worsening shortness of breath since he returned home and called an ambulance due to this       Physical Exam   Triage Vital Signs: BP 135/75   Pulse 98   Temp 97.6 F (36.4 C) (Axillary)   Resp (!) 23   Wt 102.7 kg   SpO2 97%   BMI 34.43 kg/m     Most recent vital signs: Vitals:   12/25/23 2239 12/25/23 2308  BP:  135/75  Pulse:  98  Resp:  (!) 23  Temp: 97.6 F (36.4 C)   SpO2:  97%     General: Awake, moderate respiratory distress CV:  Good peripheral perfusion. RRR, RP 2+, no significant lower extremity edema Resp:  Tachypneic, 3-5 word sentences on BiPAP, and  expiratory wheezing throughout, no significant coarse breath sounds appreciated Abd:  No distention. Nontender to deep palpation throughout   ED Results / Procedures / Treatments   Labs (all labs ordered are listed, but only abnormal results are displayed) Labs Reviewed  BASIC METABOLIC PANEL WITH GFR - Abnormal; Notable for the following components:      Result Value   Glucose, Bld 236 (*)    Calcium  8.5 (*)    All other components within normal limits  CBC WITH DIFFERENTIAL/PLATELET - Abnormal; Notable for the following components:   MCV 100.2 (*)    All other components within normal limits  BRAIN NATRIURETIC PEPTIDE - Abnormal; Notable for the following components:   B Natriuretic Peptide 876.4 (*)    All other components within normal limits  BLOOD GAS, VENOUS - Abnormal; Notable for the following components:   pCO2, Ven 75 (*)    Bicarbonate 33.7 (*)    Acid-Base Excess 4.1 (*)    All other components within normal limits  TROPONIN I (HIGH SENSITIVITY) - Abnormal; Notable for the following components:   Troponin I (High Sensitivity) 151 (*)    All other components within normal limits  MAGNESIUM  CBC WITH DIFFERENTIAL/PLATELET  TROPONIN I (HIGH SENSITIVITY)     EKG  See ED course below   RADIOLOGY Chest x-ray interpreted by myself and radiology report reviewed.  Mild pulmonary vascular congestion.  No focal consolidation suggest pneumonia.    PROCEDURES:  Critical Care performed: Yes, see critical care procedure note(s)  .Critical Care  Performed by: Collis Deaner, MD Authorized by: Collis Deaner, MD   Critical care provider statement:    Critical care time (minutes):  30   Critical care was necessary to treat or prevent imminent or life-threatening deterioration of the following conditions:  Respiratory failure   Critical care was time spent personally by me on the following activities:  Development of treatment plan with patient or surrogate,  discussions with consultants, evaluation of patient's response to treatment, examination of patient, ordering and review of laboratory studies, ordering and review of radiographic studies, ordering and performing treatments and interventions, pulse oximetry, re-evaluation of patient's condition and review of old charts   I assumed direction of critical care for this patient from another provider in my specialty: no     Care discussed with: admitting provider      MEDICATIONS ORDERED IN ED: Medications  sodium chloride  flush (NS) 0.9 % injection 3 mL (3 mLs Intravenous Given 12/25/23 2328)  sodium chloride  flush (NS) 0.9 % injection 3 mL (has no administration in time range)  0.9 %  sodium chloride  infusion (has no administration in time range)  acetaminophen  (TYLENOL ) tablet 650 mg (has no administration in time range)  ondansetron  (ZOFRAN ) injection 4 mg (has no administration in time range)  furosemide  (LASIX ) injection 40 mg (has no administration in time range)  ipratropium-albuterol  (DUONEB) 0.5-2.5 (3) MG/3ML nebulizer solution 3 mL (has no administration in time range)  predniSONE  (DELTASONE ) tablet 40 mg (has no administration in time range)  insulin  aspart (novoLOG ) injection 0-15 Units (has no administration in time range)  sacubitril -valsartan  (ENTRESTO ) 97-103 mg per tablet (has no administration in time range)  carvedilol  (COREG ) tablet 12.5 mg (has no administration in time range)  atorvastatin  (LIPITOR ) tablet 80 mg (has no administration in time range)  apixaban  (ELIQUIS ) tablet 5 mg (has no administration in time range)  allopurinol  (ZYLOPRIM ) tablet 100 mg (has no administration in time range)  ipratropium-albuterol  (DUONEB) 0.5-2.5 (3) MG/3ML nebulizer solution 3 mL (has no administration in time range)  ipratropium-albuterol  (DUONEB) 0.5-2.5 (3) MG/3ML nebulizer solution 3 mL (3 mLs Nebulization Given 12/25/23 2259)  furosemide  (LASIX ) injection 40 mg (40 mg Intravenous Given  12/25/23 2316)     IMPRESSION / MDM / ASSESSMENT AND PLAN / ED COURSE  I reviewed the triage vital signs and the nursing notes.                              DDX/MDM/AP: Differential diagnosis includes, but is not limited to, hypoxic respiratory failure in the setting of primary COPD exacerbation, consider some component of CHF and/or flash pulmonary edema though fortunately blood pressure has already improved by time of my reeval after single dose of nitroglycerin  and route.  Patient already had screening labs and chest x-ray this morning with no evidence of underlying pneumonia, serial troponins were stable.  Notable wheezing on exam here and does appear to be improving significantly with treatment of asthma/COPD exacerbation.  Will continue DuoNebs, BiPAP, consider diuresis and plan for admission, has failed outpatient management and was notably hypoxic to 70s in the field.  Plan: - Repeat labs, add BNP, VBG - DuoNeb - BiPAP - Repeat chest x-ray -Continue to monitor BP - Admit  Patient's presentation is most consistent with acute presentation with potential threat to  life or bodily function.  The patient is on the cardiac monitor to evaluate for evidence of arrhythmia and/or significant heart rate changes.  ED course below.  Chest x-ray with some evidence of vascular congestion, treated with Lasix .  VBG with borderline acidosis, pCO2 elevated, bicarb also elevated most consistent with chronic obstructive pattern.  Responding well to BiPAP.  Admitted to medicine.  Clinical Course as of 12/26/23 0035  Mon Dec 25, 2023  2237 Hospitalist consult order placed  Workup already complete this morning [MM]  2238 Ecg = sinus tachycardia, rate 101, no ST elevation or depression, no significant repolarization abnormality, normal axis, normal intervals [MM]    Clinical Course User Index [MM] Collis Deaner, MD     FINAL CLINICAL IMPRESSION(S) / ED DIAGNOSES   Final diagnoses:  COPD  exacerbation (HCC)  Acute respiratory failure with hypoxia and hypercapnia (HCC)  Acute on chronic congestive heart failure, unspecified heart failure type (HCC)     Rx / DC Orders   ED Discharge Orders     None        Note:  This document was prepared using Dragon voice recognition software and may include unintentional dictation errors.   Collis Deaner, MD 12/26/23 [phone]

## 2023-12-25 NOTE — ED Notes (Signed)
 CCMD called for cardiac monitoring.

## 2023-12-25 NOTE — Assessment & Plan Note (Signed)
 On admission. History of COPD, on as needed albuterol  only.  Given diffuse wheezing, suspect COPD is also contributing to current picture. - S/p Solu-Medrol  125 mg once - Start prednisone  40 mg daily tomorrow - DuoNebs every 6 hours - Hold off on antibiotics given low suspicion for bacterial etiology  12-27-2023 off bipap. On 3 L/min O2. Sats 100%. No peripheral edema now. Can stop IV lasix . I think most of his SOB now is due to COPD exacerbation. On po prednisone . Needs home nebulizer machine. Watch overnight. Wean to RA. Possible home in AM.  12-28-2023 weaned to RA. Stable. DC to home with home nebs machine, duonebs, 7 days of prednisone . No need for abx.

## 2023-12-25 NOTE — Assessment & Plan Note (Signed)
 On admission. History of HFrEF in the setting of nonischemic cardiomyopathy with last EF of 25-30% in June 2024.  He follows with the advanced heart failure team. Patient presented earlier today with 5 days of shortness of breath and nonproductive cough, in the setting of fluctuating weight and abdominal distention.  Subsequently developed pulmonary edema this afternoon requiring return to the ED and BiPAP. - Telemetry monitoring - Consider advanced heart failure team consultation in the a.m. - Start Lasix  40 mg IV twice daily - BNP pending - Strict ins and out - Daily weights - Daily BMP and magnesium - Continue home GDMT  12-27-2023 off bipap. On 3 L/min O2. Sats 100%. No peripheral edema now. Can stop IV lasix . I think most of his SOB now is due to COPD exacerbation. On po prednisone . Needs home nebulizer machine. Watch overnight. Wean to RA. Possible home in AM.  12-28-2023 stable. Now. Continue home GDMT.

## 2023-12-25 NOTE — Discharge Instructions (Signed)
 Call make an appointment with your primary care provider for follow-up in 1 week.  Also continue following with Jorge Cruz who manages your congestive heart failure.  For the next several days take 2 of your fluid pills and continue monitoring your weight.  You may want to write these down so that Jorge Cruz can see what your weight is doing.  Continue with your albuterol  that you have at home.  A prescription for Tessalon  as needed for cough every 8 hours and doxycycline  twice daily for the next 10 days was sent to the pharmacy.  Return to the emergency department if any severe worsening of your symptoms such as difficulty breathing, shortness of breath or chest pain.  You should also discontinue smoking which would also help with your breathing as we discussed.

## 2023-12-25 NOTE — H&P (Signed)
 History and Physical    Patient: Jorge Cruz:[id number] DOB: August 28, 1950 DOA: 12/25/2023 DOS: the patient was seen and examined on 12/25/2023 PCP: [facility]  Patient coming from: Home  Chief Complaint:  Chief Complaint  Patient presents with   Respiratory Distress   HPI: Jorge Cruz is a 73 y.o. 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  Jorge Cruz states Jorge Cruz was here in the ED earlier today due to shortness of breath and nonproductive cough that Jorge Cruz had been experiencing for approximately 5 days.  During this time, Jorge Cruz is also noted fluctuations in his weight and abdominal distention.  His wife at bedside feels that his abdomen appears bigger than normal.  Jorge Cruz denies any fever, congestion, nausea, vomiting, diarrhea.  Jorge Cruz denies any lower extremity swelling.  Jorge Cruz was started on breathing treatments and discharged home with doxycycline .  Jorge Cruz states that after arriving home, his shortness of breath rapidly progressed and became severe.  Jorge Cruz began to experience chest pressure when Jorge Cruz was in respiratory distress but notes that the pressure improved when his shortness of breath improved.  Jorge Cruz denies any new symptoms after arriving home.  ED course: On arrival to the ED, patient was hypertensive at 158/89 with heart rate of 107.  Jorge Cruz was saturating at 99% on BiPAP with 40% FiO2.  Jorge Cruz was afebrile at 97.6.  Chest x-ray obtained and pending.  No repeat blood work obtained, however ordered.  Patient given DuoNeb and TR[facility] contacted for admission.  Review of Systems: As mentioned in the history of present illness. All other systems reviewed and are negative.  Past Medical History:  Diagnosis Date   CHF (congestive heart failure) (HCC)     Chronic combined systolic (congestive) and diastolic (congestive) heart failure (HCC)    a. 03/2014 Echo: EF 45%, glob HK; b. 03/2016 Echo: EF 20-25%, diff HK, Gr1 DD, mild MR; b. 11/2017 Echo: EF 20%, diff HK, Gr2 DD, mildly to mod reduced RV fxn, PASP .   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 03/2014 Echo: EF 45%, glob HK, mild conc LVH; b. 03/2014 MV: possible mild ischemia, superimposed on small inf infarct, EF 36%; c. 03/2016 MV: EF <30%, no ischemia; d. 03/2016 Echo: EF 20-25%, diff HK, Gr1 DD, mild MR; e. 05/2017 Cath: nonobs dzs, EF 20%; f. 11/2017 Echo: EF 20%, diff HK, Gr2 DD.   Non-obstructive CAD (coronary artery disease)    a. 05/2017 Cath: LM nl, LAD 77m, LCX 44m, RCA 53m, EF 20%.   Paroxysmal atrial fibrillation (HCC)    Polysubstance abuse (HCC)    Tobacco abuse    Past Surgical History:  Procedure Laterality Date   RIGHT/LEFT HEART CATH AND CORONARY ANGIOGRAPHY N/A 06/01/2017   Procedure: RIGHT/LEFT HEART CATH AND CORONARY ANGIOGRAPHY;  Surgeon: Wenona Hamilton, MD;  Location: [facility] INVASIVE CV LAB;  Service: Cardiovascular;  Laterality: N/A;   XI ROBOTIC ASSISTED INGUINAL HERNIA REPAIR WITH MESH Right 03/03/2021   Procedure: XI ROBOTIC  ASSISTED INGUINAL HERNIA REPAIR WITH MESH;  Surgeon: Eldred Grego, MD;  Location: [facility] ORS;  Service: General;  Laterality: Right;   Social History:  reports that Jorge Cruz has been smoking cigarettes. Jorge Cruz has a 26 pack-year smoking history. Jorge Cruz has never used smokeless tobacco. Jorge Cruz reports current alcohol use of about 4.0 - 5.0 standard drinks of alcohol per week. Jorge Cruz reports that Jorge Cruz does not currently use drugs after having used the following  drugs: Cocaine.  Allergies  Allergen Reactions   Spironolactone  Other (See Comments)    Hyperkalemia    Family History  Problem Relation Age of Onset   Diabetes Mother    Diabetes Father     Prior to Admission medications   Medication Sig Start Date End Date Taking? Authorizing Provider  albuterol  (PROVENTIL  HFA;VENTOLIN  HFA) 108 (90 Base) MCG/ACT inhaler Inhale 2 puffs into the lungs every 6 (six) hours as needed for wheezing or shortness of breath.    [provider]  allopurinol  (ZYLOPRIM ) 100 MG tablet Take 1 tablet by mouth daily. 11/17/20   [provider]  apixaban  (ELIQUIS ) 5 MG TABS tablet Take 1 tablet (5 mg total) by mouth 2 (two) times daily. 08/11/23   Wouk, Haynes Lips, MD  atorvastatin  (LIPITOR ) 80 MG tablet Take 1 tablet (80 mg total) by mouth daily. 08/11/23   Wouk, Haynes Lips, MD  benzonatate  (TESSALON ) 200 MG capsule Take 1 capsule (200 mg total) by mouth 3 (three) times daily as needed for cough. 12/25/23 12/24/24  Stafford Eagles, PA-C  carvedilol  (COREG ) 12.5 MG tablet Take 1 tablet (12.5 mg total) by mouth 2 (two) times daily. 10/18/23 01/16/24  Charlette Console, FNP  clotrimazole  (CLOTRIMAZOLE  ANTI-FUNGAL) 1 % cream Apply 1 Application topically 2 (two) times daily. Apply to the foreskin and head of the penis 07/23/22   Buell Carmin, MD  doxycycline  (VIBRAMYCIN ) 100 MG capsule Take 1 capsule (100 mg total) by mouth 2 (two) times daily. 12/25/23   Stafford Eagles, PA-C  FARXIGA  10 MG TABS tablet TAKE 1 TABLET (10 MG TOTAL) BY MOUTH DAILY BEFORE BREAKFAST. NEEDS FOLLOW UP APPOINTMENT FOR MORE REFILLS 10/17/23   Shawnee Dellen A, FNP  fluticasone  (FLONASE ) 50 MCG/ACT nasal spray Place 2 sprays into both nostrils daily.    [provider]  furosemide  (LASIX ) 40 MG tablet Take 1 tablet (40 mg total) by mouth daily. 12/12/23 12/11/24  Ronald Cockayne, NP  guaiFENesin -dextromethorphan  (ROBITUSSIN DM) 100-10 MG/5ML syrup Take 5 mLs by mouth every 4 (four)  hours as needed for cough. 07/23/22   Buell Carmin, MD  hydrocortisone cream 1 % Apply 1 Application topically 2 (two) times daily. 05/24/23   [provider]  metFORMIN  (GLUCOPHAGE ) 1000 MG tablet Take 1,000 mg by mouth 2 (two) times daily. 08/15/23   [provider]  nitroGLYCERIN  (NITROSTAT ) 0.4 MG SL tablet DISSOLVE 1 TABLET UNDER THE  TONGUE EVERY 5 MINUTES AS NEEDED FOR CHEST PAIN. MAX OF 3 TABLETS IN 15 MINUTES. CALL 911 IF PAIN  PERSISTS. 02/16/23   Dunn, Elvia Hammans, PA-C  predniSONE  (STERAPRED UNI-PAK 21 TAB) 10 MG (21) TBPK tablet As directed on packaging 12/14/23   Bradler, Evan K, MD  sacubitril -valsartan  (ENTRESTO ) 97-103 MG Take 1 tablet by mouth 2 (two) times daily. 08/11/23   Janeane Mealy, MD    Physical Exam: Vitals:   12/25/23 2232 12/25/23 2239 12/25/23 2303 12/25/23 2308  BP: (!) 158/89   135/75  Pulse: (!) 107   98  Resp: (!) 24   Aaron Aas)  23  Temp:  97.6 F (36.4 C)    TempSrc:  Axillary    SpO2: 99%   97%  Weight:   102.7 kg    Physical Exam Vitals and nursing note reviewed.  Constitutional:      Appearance: Jorge Cruz is obese.  HENT:     Head: Normocephalic and atraumatic.  Cardiovascular:     Rate and Rhythm: Regular rhythm. Tachycardia present.  Pulmonary:     Effort: Tachypnea and accessory muscle usage present.     Breath sounds: Wheezing and rales present.     Comments: Diffuse expiratory wheezing, with inspiratory crackles heard in the bases Chest:     Comments: Some left sided chest tenderness on palpation Abdominal:     General: There is distension.     Palpations: Abdomen is soft.     Tenderness: There is no abdominal tenderness.  Musculoskeletal:     Right lower leg: No edema.     Left lower leg: No edema.  Skin:    General: Skin is warm and dry.  Neurological:     Mental Status: Jorge Cruz is alert and oriented to person, place, and time. Mental status is at baseline.  Psychiatric:        Mood and Affect: Mood normal.        Behavior:  Behavior normal.    Data Reviewed:  Blood work obtained at ED visit earlier today: CBC with WBC of 4.7, hemoglobin of 14.2, platelets 178 CMP with sodium of 140, potassium 3.8, bicarb 27, glucose 162, creatinine 0.98, AST 20, ALT 14, GFR above 60 Troponin 81 and then 73  Blood work pending at this time: Troponin, BNP, VBG, CBC, BNP  EK G personally reviewed.  Sinus rhythm with rate of 101.  First-degree AV block.  No acute ischemic changes.  DG Chest Portable 1 View Result Date: 12/25/2023 CLINICAL DATA:  Shortness of breath EXAM: PORTABLE CHEST 1 VIEW COMPARISON:  12/25/2023 FINDINGS: Left AICD remains in place, unchanged. Cardiomegaly with vascular congestion and interstitial prominence, likely interstitial edema. No effusions or acute bony abnormality. IMPRESSION: Cardiomegaly with vascular congestion and mild interstitial edema. Electronically Signed   By: Janeece Mechanic M.D.   On: 12/25/2023 22:56   DG Chest 2 View Result Date: 12/25/2023 CLINICAL DATA:  Cough EXAM: CHEST - 2 VIEW COMPARISON:  Chest x-ray performed December 14, 2023 FINDINGS: Heart is enlarged. An implant is present overlying the left chest with 2 leads terminating in the heart. No effusion or pneumothorax. IMPRESSION: 1. Enlarged heart. 2. No overt edema. Electronically Signed   By: Reagan Camera M.D.   On: 12/25/2023 10:28   Results are pending, will review when available.  Assessment and Plan:  * Acute hypoxic respiratory failure (HCC) Patient is presenting with profound shortness of breath and found to be hypoxic as low as 70%, with improvement on BiPAP.  Compared to chest x-ray obtained earlier today, it appears Jorge Cruz has now developed pulmonary edema.  Suspect COPD exacerbation is also contributing.  - Continue BiPAP - Wean as tolerated - VBG pending  Acute on chronic heart failure with reduced ejection fraction (HFrEF, EF 25-30% 01/2023) (HCC) History of HFrEF in the setting of nonischemic cardiomyopathy with last EF  of 25-30% in June 2024.  Jorge Cruz follows with the advanced heart failure team. Patient presented earlier today with 5 days of shortness of breath and nonproductive cough, in the setting of fluctuating weight and abdominal distention.  Subsequently developed pulmonary edema this afternoon requiring return  to the ED and BiPAP.  - Telemetry monitoring - Consider advanced heart failure team consultation in the a.m. - Start Lasix  40 mg IV twice daily - BNP pending - Strict ins and out - Daily weights - Daily BMP and magnesium - Continue home GDMT  COPD with acute exacerbation (HCC) History of COPD, on as needed albuterol  only.  Given diffuse wheezing, suspect COPD is also contributing to current picture.  - S/p Solu-Medrol  125 mg once - Start prednisone  40 mg daily tomorrow - DuoNebs every 6 hours - Hold off on antibiotics given low suspicion for bacterial etiology  Essential hypertension On EMS arrival, patient was notably hypertensive above 200 systolics.  Nitropaste was placed in blood pressure has improved close to baseline at this time.  Unclear if hypertensive emergency was contributing to rapid pulmonary edema earlier today.  - Hold off on nitroglycerin  infusion at this time given improvement in blood pressure - Continue home regimen  Demand ischemia (HCC) Mildly elevated troponin earlier today in the setting of shortness of breath.  Repeat troponin will be even higher given severe respiratory distress requiring BiPAP.  History of nonobstructive CAD.  Chest pain has resolved with improvement in breathing.  - Troponin pending - Hold off on heparin  unless markedly elevated troponin  Paroxysmal atrial fibrillation (HCC) Currently in sinus rhythm on EKG.  - Continue home regimen  Diabetes (HCC) History of well-controlled type 2 diabetes with last A1c of 7.2% approximately 4 months ago.  - Hold home metformin  - SSI, moderate  Advance Care Planning:   Code Status: Full Code Verified  by patient with wife at bedside.   Consults: None  Family Communication: Patient's wife updated at bedside  Severity of Illness: The appropriate patient status for this patient is INPATIENT. Inpatient status is judged to be reasonable and necessary in order to provide the required intensity of service to ensure the patient's safety. The patient's presenting symptoms, physical exam findings, and initial radiographic and laboratory data in the context of their chronic comorbidities is felt to place them at high risk for further clinical deterioration. Furthermore, it is not anticipated that the patient will be medically stable for discharge from the hospital within 2 midnights of admission.   * I certify that at the point of admission it is my clinical judgment that the patient will require inpatient hospital care spanning beyond 2 midnights from the point of admission due to high intensity of service, high risk for further deterioration and high frequency of surveillance required.*  Author: Avi Body, MD 12/25/2023 11:42 PM  For on call review [email or web address].

## 2023-12-25 NOTE — ED Notes (Signed)
 This RN could not get a successful blood draw from pt. Charge RN called to stick pt.

## 2023-12-25 NOTE — ED Notes (Signed)
 Lab at bedside for blood draw.

## 2023-12-25 NOTE — ED Triage Notes (Signed)
 Pt arrived via POV with reports of productive cough x 5 days.  Pt does smoke, hx of COPD.  Denies any fevers.  Denies any sick contacts.

## 2023-12-25 NOTE — Assessment & Plan Note (Signed)
 Patient is presenting with profound shortness of breath and found to be hypoxic as low as 70%, with improvement on BiPAP.  Compared to chest x-ray obtained earlier today, it appears he has now developed pulmonary edema.  Suspect COPD exacerbation is also contributing.  - Continue BiPAP - Wean as tolerated - VBG pending

## 2023-12-25 NOTE — Assessment & Plan Note (Signed)
 On admission. Mildly elevated troponin earlier today in the setting of shortness of breath.  Repeat troponin will be even higher given severe respiratory distress requiring BiPAP.  History of nonobstructive CAD.  Chest pain has resolved with improvement in breathing. - Troponin pending - Hold off on heparin  unless markedly elevated troponin  12-27-2023 stable. No chest pain.  12-28-2023 Stable. DC to home

## 2023-12-26 ENCOUNTER — Encounter: Payer: Self-pay | Admitting: Internal Medicine

## 2023-12-26 DIAGNOSIS — E66811 Obesity, class 1: Secondary | ICD-10-CM

## 2023-12-26 DIAGNOSIS — J9601 Acute respiratory failure with hypoxia: Secondary | ICD-10-CM | POA: Diagnosis not present

## 2023-12-26 DIAGNOSIS — I5023 Acute on chronic systolic (congestive) heart failure: Secondary | ICD-10-CM | POA: Diagnosis not present

## 2023-12-26 DIAGNOSIS — J441 Chronic obstructive pulmonary disease with (acute) exacerbation: Secondary | ICD-10-CM | POA: Diagnosis not present

## 2023-12-26 LAB — BASIC METABOLIC PANEL WITH GFR
Anion gap: 7 (ref 5–15)
BUN: 20 mg/dL (ref 8–23)
CO2: 28 mmol/L (ref 22–32)
Calcium: 8.5 mg/dL — ABNORMAL LOW (ref 8.9–10.3)
Chloride: 101 mmol/L (ref 98–111)
Creatinine, Ser: 1.23 mg/dL (ref 0.61–1.24)
GFR, Estimated: >60 mL/min (ref >60)
Glucose, Bld: 236 mg/dL — ABNORMAL HIGH (ref 70–99)
Potassium: 4.3 mmol/L (ref 3.5–5.1)
Sodium: 136 mmol/L (ref 135–145)

## 2023-12-26 LAB — CBG MONITORING, ED
Glucose-Capillary: 197 mg/dL — ABNORMAL HIGH (ref 70–99)
Glucose-Capillary: 285 mg/dL — ABNORMAL HIGH (ref 70–99)
Glucose-Capillary: 226 mg/dL — ABNORMAL HIGH (ref 70–99)
Glucose-Capillary: 183 mg/dL — ABNORMAL HIGH (ref 70–99)

## 2023-12-26 LAB — BRAIN NATRIURETIC PEPTIDE: B Natriuretic Peptide: 876.4 pg/mL — ABNORMAL HIGH (ref 0.0–100.0)

## 2023-12-26 LAB — TROPONIN I (HIGH SENSITIVITY)
Troponin I (High Sensitivity): 151 ng/L (ref <18)
Troponin I (High Sensitivity): 284 ng/L (ref <18)

## 2023-12-26 LAB — MAGNESIUM: Magnesium: 2.2 mg/dL (ref 1.7–2.4)

## 2023-12-26 MED ORDER — IPRATROPIUM-ALBUTEROL 0.5-2.5 (3) MG/3ML IN SOLN
3.0000 mL | Freq: Once | RESPIRATORY_TRACT | Status: AC
  Administered 2023-12-26: 3 mL via RESPIRATORY_TRACT
  Filled 2023-12-26: qty 3

## 2023-12-26 MED ORDER — GUAIFENESIN 100 MG/5ML PO LIQD
5.0000 mL | ORAL | Status: DC | PRN
  Administered 2023-12-27 (×2): 5 mL via ORAL
  Filled 2023-12-26: qty 5
  Filled 2023-12-26: qty 10

## 2023-12-26 NOTE — Progress Notes (Signed)
 Progress Note   Patient: Jorge Cruz ZOX:[id number] DOB: June 20, 1951 DOA: 12/25/2023     1 DOS: the patient was seen and examined on 12/26/2023   Brief hospital course: Jorge Cruz is a 73 y.o. 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  He also has a nonproductive cough. Upon arriving to hospital, he had significant respiratory distress, was placed on BiPAP with 40% oxygen. ABG showed significant CO2 retention, creatinine 1.36, BNP 876, troponin peak at 284. Patient is placed on IV Lasix  and steroids. He is off BiPAP in the morning and 5/6.   Principal Problem:   Acute hypoxic respiratory failure (HCC) Active Problems:   Acute on chronic heart failure with reduced ejection fraction (HFrEF, EF 25-30% 01/2023) (HCC)   COPD with acute exacerbation (HCC)   Essential hypertension   Demand ischemia (HCC)   Diabetes (HCC)   Paroxysmal atrial fibrillation (HCC)   Class 1 obesity   Assessment and Plan: * Acute hypoxic respiratory failure (HCC) secondary to CHF exacerbation and COPD exacerbation. Acute on chronic heart failure with reduced ejection fraction (HFrEF, EF 25-30% 01/2023) (HCC) Patient is presenting with profound shortness of breath and found to be hypoxic as low as 70%, with improvement on BiPAP.  Patient has elevated BNP, chest x-ray showed evidence of pulmonary edema. Patient was placed on BiPAP, weaned down to 3 L oxygen.  Also started on IV Lasix . History of HFrEF in the setting of nonischemic cardiomyopathy with last EF of 25-30% in June 2024.  He follows with the advanced heart failure team.  Continue home GDMT Continue IV Lasix  for another day, may be able to change to oral tomorrow.  COPD with acute exacerbation (HCC) Had diffuse  wheezing metabolization, started on steroids.  Essential hypertension Resume home treatment.  Demand ischemia (HCC) Peak troponin 284.  No chest pain, this most likely due to demand ischemia from congestive heart failure.  Paroxysmal atrial fibrillation (HCC) Currently in sinus rhythm on EKG. Continue Eliquis .  Diabetes (HCC) Continue sliding scale insulin .       Subjective:  Patient still has been short of breath with exertion and the cough.  I do feel much better than yesterday.  Physical Exam: Vitals:   12/26/23 0740 12/26/23 0836 12/26/23 0930 12/26/23 1035  BP:  129/68 (!) 117/93 130/70  Pulse:  70 67 80  Resp:   19 19  Temp: 98.4 F (36.9 C)     TempSrc: Oral     SpO2:   96% 98%  Weight:       General exam: Appears calm and comfortable  Respiratory system: Decreased breath sounds with some rhonchi in the bases. Respiratory effort normal. Cardiovascular system: S1 & S2 heard, RRR. No JVD, murmurs, rubs, gallops or clicks. No pedal edema. Gastrointestinal system: Abdomen is nondistended, soft and nontender. No organomegaly or masses felt. Normal bowel sounds heard. Central nervous system: Alert and oriented. No focal neurological deficits. Extremities: Symmetric 5 x 5 power. Skin: No rashes, lesions or ulcers Psychiatry: Judgement and insight appear normal. Mood & affect appropriate.    Data Reviewed:  Reviewed the chest x-ray and the lab results.  Family Communication: Wife at bedside.  Disposition: Status is: Inpatient Remains inpatient appropriate because: Severity of disease, IV treatment.     Time spent: 78  minutes  Author: Donaciano Frizzle, MD 12/26/2023 11:05 AM  For on call review [email or web address].

## 2023-12-26 NOTE — Progress Notes (Incomplete)
 Heart Failure Stewardship Pharmacy Note  PCP: [facility], [facility] PCP-Cardiologist: Sammy Crisp, MD  HPI: Jorge Cruz is a 73 y.o. male with CAD with history of PCI, history of cocaine use, HFrEF (EF 25 to 30% 01/2023) ischemic cardiomyopathy s/p AICD, paroxysmal A-fib on Eliquis  DM, COPD, nicotine  dependence, HTN who presented with 2 week history of non-productive cough, progressing a week later to dyspnea on exertion and a weight gain of 10 lbs. Treated COPD with steroids and antibiotics outpatient. High salt intake with fast food. BNP on admission is 637.6. HS-troponin on admission is 92. No pulmonary edema noted on CXR.    Pertinent cardiac history: Stress test in 03/2016 with LVEF of <30%, severe LV dilation, and no perfusion defects. Echo at the same time with LVEF of 20-25%, grade I diastolic dysfunction, mild MR. LHC/RHC in 05/2017 showed mild nonobstructive CAD, cardiac index of 1.83 and PCWP of 5. LVEF unchanged in 11/2017. Echo in 09/2017 with LVEF improved to 30-35% and RV low-normal. ICD implanted in 2021. Similar in 07/2021. Echo in 01/2023 showed LVEF down to 25-30% with low-normal RV function.  Pertinent Lab Values: Creatinine, Ser  Date Value Ref Range Status  12/25/2023 1.23 0.61 - 1.24 mg/dL Final   BUN  Date Value Ref Range Status  12/25/2023 20 8 - 23 mg/dL Final  16/05/9603 16 8 - 27 mg/dL Final   Potassium  Date Value Ref Range Status  12/25/2023 4.3 3.5 - 5.1 mmol/L Final   Sodium  Date Value Ref Range Status  12/25/2023 136 135 - 145 mmol/L Final  12/12/2023 142 134 - 144 mmol/L Final   B Natriuretic Peptide  Date Value Ref Range Status  12/25/2023 876.4 (H) 0.0 - 100.0 pg/mL Final    Comment:    Performed at [facility], [address] [place], [place] [address]   Magnesium  Date Value Ref Range Status  12/25/2023 2.2 1.7 - 2.4 mg/dL Final    Comment:    Performed at [facility], [address]  [place], [place] [address]   Hgb A1c MFr Bld  Date Value Ref Range Status  08/09/2023 7.2 (H) 4.8 - 5.6 % Final    Comment:    (NOTE) Pre diabetes:          5.7%-6.4%  Diabetes:              >6.4%  Glycemic control for   <7.0% adults with diabetes    TSH  Date Value Ref Range Status  11/24/2017 0.940 0.350 - 4.500 uIU/mL Final    Comment:    Performed by a 3rd Generation assay with a functional sensitivity of <=0.01 uIU/mL. Performed at [facility], [address] [place], [place] [address]     Vital Signs: Admission weight: Temp:  [97.6 F (36.4 C)-98.1 F (36.7 C)] 98.1 F (36.7 C) (05/06 0316) Pulse Rate:  [71-107] 73 (05/06 0630) Cardiac Rhythm: Normal sinus rhythm (05/06 0522) Resp:  [11-24] 16 (05/06 0630) BP: (110-158)/(64-89) 131/70 (05/06 0630) SpO2:  [96 %-100 %] 97 % (05/06 0630) FiO2 (%):  [40 %] 40 % (05/05 2232) Weight:  [98 kg (216 lb)-102.7 kg (226 lb 6.6 oz)] 102.7 kg (226 lb 6.6 oz) (05/05 2303)  Intake/Output Summary (Last 24 hours) at 12/26/2023 0730 Last data filed at 12/25/2023 2328 Gross per 24 hour  Intake 3 ml  Output --  Net 3 ml    Current Heart Failure Medications:  Loop diuretic: none Beta-Blocker: carvedilol  6.25  mg BID ACEI/ARB/ARNI: Entresto  97/103 mg BID MRA: none SGLT2i: Farxiga  10 mg daily Other:   Prior to admission Heart Failure Medications:  Loop diuretic: furosemide  20 mg daily Beta-Blocker: carvedilol  6.25 mg BID ACEI/ARB/ARNI: Entresto  97/103 mg BID MRA: none SGLT2i: Farxiga  10 mg daily Other:  Assessment: 1. Acute on chronic systolic heart failure (LVEF 01/2023 25-30%) with low-normal RV function, due to NICM. NYHA class II-III symptoms.  -Symptoms: -Volume: -Hemodynamics: -BB: -ACEI/ARB/ARNI: -MRA: -SGLT2i:  Plan: 1) Medication changes recommended at this time:  2) Patient assistance:   3) Education: -To be completed prior to discharge.  *** Medication Assistance / Insurance Benefits  Check: Does the patient have prescription insurance?    Type of insurance plan:  Does the patient qualify for medication assistance through manufacturers or grants? {CHL AMB Yes/No/Pending:[id number]}  Eligible grants and/or patient assistance programs: ***  Medication assistance applications in progress: ***  Medication assistance applications approved: *** Approved medication assistance renewals will be completed by: ***  Outpatient Pharmacy: Prior to admission outpatient pharmacy: ***      ***

## 2023-12-26 NOTE — Hospital Course (Addendum)
 Jorge Cruz is a 73 y.o. 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  He also has a nonproductive cough. Upon arriving to hospital, he had significant respiratory distress, was placed on BiPAP with 40% oxygen. ABG showed significant CO2 retention, creatinine 1.36, BNP 876, troponin peak at 284. Patient is placed on IV Lasix  and steroids. He is off BiPAP in the morning and 5/6.  HPI: Jorge Cruz is a 73 y.o. 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   Jorge Cruz states he was here in the ED earlier today due to shortness of breath and nonproductive cough that he had been experiencing for approximately 5 days.  During this time, he is also noted fluctuations in his weight and abdominal distention.  His wife at bedside feels that his abdomen appears bigger than normal.  He denies any fever, congestion, nausea, vomiting, diarrhea.  He denies any lower extremity swelling.  He was started on breathing treatments and discharged home with doxycycline .   Jorge Cruz states that after arriving home, his shortness of breath rapidly progressed and became severe.  He began to experience chest pressure when he was in respiratory distress but notes that the pressure improved when his shortness of breath improved.  He denies any new symptoms after arriving home.   ED course: On arrival to the ED, patient was hypertensive at 158/89 with heart rate of 107.  He was saturating at 99% on BiPAP with 40% FiO2.  He was afebrile at 97.6.  Chest x-ray  obtained and pending.  No repeat blood work obtained, however ordered.  Patient given DuoNeb and [facility] contacted for admission.  Significant Events: Admitted 12/25/2023 for acute hypoxic respiratory failure   Significant Labs: WBC 8.5, HgB 13.7, plt 160 Na 136, K 4.3, CO2 of 28, BUN 20, Scr 1.23, glu 236 BNP 876 Mg 2.2 VBG pH 7.26, PCO2 of 75  Significant Imaging Studies: CXR 1. Enlarged heart. 2. No overt edema  Antibiotic Therapy: Anti-infectives (From admission, onward)    None       Procedures:   Consultants:

## 2023-12-26 NOTE — ED Notes (Signed)
 Lab called to come draw pt's troponin level.

## 2023-12-26 NOTE — ED Notes (Signed)
 Wrote to attending hospitalist to inform of troponin 284 up from 151 this morning around 1am this morning.

## 2023-12-26 NOTE — ED Notes (Signed)
 CCMD called to initiate cardiac monitoring

## 2023-12-26 NOTE — Progress Notes (Signed)
 Heart Failure Nurse Navigator Progress Note  PCP: [facility] PCP-Cardiologist: Sammy Crisp, MD Admission Diagnosis: COPD exacerbation (HCC) Acute respiratory failure  with hypoxia and hypercapnia (HCC) Acute on chronic congestive heart failure, unspecified heart failure type [facility]) Admitted from: Home via [facility]   Presentation:   Jorge Cruz presented with shortness of breath.  Initial O2 sats in low 70's and patient placed on CPAP with an increase to 94%.  Patient tachypneic and hypertensive in route to the ED. Patient recently treated for COPD exacerbation on 12/14/23 and given steroids at that time. History of non -productive cough progressing a week later to dyspnea on exertion and a weight gain of 10 lbs.    History of cocaine use. BNP on admission is 637.6.  HS- troponin on admission is 92.  Chest-x-ray-cardiomegaly with vascular congestion and mild interstitial edema.  ECHO/ LVEF: 25-30% The left ventricle has severely decreased function.  And global hypokinesis.Mild left ventricular hypertrophy. 02/08/2023  Clinical Course:  Past Medical History:  Diagnosis Date   CHF (congestive heart failure) (HCC)    Chronic combined systolic (congestive) and diastolic (congestive) heart failure (HCC)    a. 03/2014 Echo: EF 45%, glob HK; b. 03/2016 Echo: EF 20-25%, diff HK, Gr1 DD, mild MR; b. 11/2017 Echo: EF 20%, diff HK, Gr2 DD, mildly to mod reduced RV fxn, PASP .   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 03/2014 Echo: EF 45%, glob HK, mild conc LVH; b. 03/2014 MV: possible mild ischemia, superimposed on small inf infarct, EF 36%; c. 03/2016 MV: EF <30%, no ischemia;  d. 03/2016 Echo: EF 20-25%, diff HK, Gr1 DD, mild MR; e. 05/2017 Cath: nonobs dzs, EF 20%; f. 11/2017 Echo: EF 20%, diff HK, Gr2 DD.   Non-obstructive CAD (coronary artery disease)    a. 05/2017 Cath: LM nl, LAD 31m, LCX 48m, RCA 24m, EF 20%.   Paroxysmal atrial fibrillation (HCC)    Polysubstance abuse (HCC)    Tobacco abuse      Education Assessment and Provision:  Detailed education and instructions provided on heart failure disease management including the following:  Signs and symptoms of Heart Failure When to call the physician Importance of daily weights Low sodium diet Fluid restriction Medication management Anticipated future follow-up appointments  Patient education given on each of the above topics.  Patient acknowledges understanding via teach back method and acceptance of all instructions.  Education Materials:  "Living Better With Heart Failure" Booklet, HF zone tool, & Daily Weight Tracker Tool.  Patient has scale at home: Yes Patient has pill box at home: Yes     High Risk Criteria for Readmission and/or Poor Patient Outcomes: Heart failure hospital admissions (last 6 months): 3  No Show rate: 23% Difficult social situation: History of drug use, smoking and ETOH use. Demonstrates medication adherence: No Primary Language: English Literacy level: Unsure of this.  Patient was unable to spell and asked me for assistance with this.  Barriers of Care:   Missed appointments  Considerations/Referrals:   Referral made to Heart Failure Pharmacist Stewardship: Yes Referral made to Heart Failure CSW/NCM TOC: No Referral made to Heart & Vascular TOC clinic: Established patient appointment rescheduled with  Tina. 01/03/24 @ 10:30 AM. Patient aware of new appointment details.   Items for Follow-up on DC/TOC: Diet & Fluid Restrictions Daily Weights Medication Non-Compliance Smoking & ETOH cessation Continued Heart Failure Education   Celedonio Coil, RN, BSN [facility]  Heart Failure Navigator Secure Chat Only

## 2023-12-26 NOTE — ED Notes (Signed)
 Pt [place] turned down to 4L.

## 2023-12-26 NOTE — ED Notes (Signed)
 Diet tray at bedside. Pt will be given 8 units insulin .

## 2023-12-26 NOTE — ED Notes (Signed)
 Wrote to hospitalist to request something for pt cough.

## 2023-12-26 NOTE — ED Notes (Signed)
 Pt able to tolerate [place] on 10L. Pt given diet coke to drink with the permission of the hospital provider. Nothing to eat at this time.

## 2023-12-27 ENCOUNTER — Telehealth (HOSPITAL_COMMUNITY): Payer: Self-pay | Admitting: Pharmacy Technician

## 2023-12-27 ENCOUNTER — Encounter: Payer: Self-pay | Admitting: Internal Medicine

## 2023-12-27 ENCOUNTER — Other Ambulatory Visit (HOSPITAL_COMMUNITY): Payer: Self-pay

## 2023-12-27 DIAGNOSIS — N1831 Chronic kidney disease, stage 3a: Secondary | ICD-10-CM

## 2023-12-27 DIAGNOSIS — J441 Chronic obstructive pulmonary disease with (acute) exacerbation: Secondary | ICD-10-CM | POA: Diagnosis not present

## 2023-12-27 DIAGNOSIS — I5023 Acute on chronic systolic (congestive) heart failure: Secondary | ICD-10-CM | POA: Diagnosis not present

## 2023-12-27 DIAGNOSIS — J9601 Acute respiratory failure with hypoxia: Secondary | ICD-10-CM | POA: Diagnosis not present

## 2023-12-27 DIAGNOSIS — E66811 Obesity, class 1: Secondary | ICD-10-CM

## 2023-12-27 LAB — GLUCOSE, CAPILLARY
Glucose-Capillary: 201 mg/dL — ABNORMAL HIGH (ref 70–99)
Glucose-Capillary: 145 mg/dL — ABNORMAL HIGH (ref 70–99)
Glucose-Capillary: 218 mg/dL — ABNORMAL HIGH (ref 70–99)
Glucose-Capillary: 195 mg/dL — ABNORMAL HIGH (ref 70–99)

## 2023-12-27 LAB — BASIC METABOLIC PANEL WITH GFR
Anion gap: 9 (ref 5–15)
BUN: 44 mg/dL — ABNORMAL HIGH (ref 8–23)
CO2: 27 mmol/L (ref 22–32)
Calcium: 8.5 mg/dL — ABNORMAL LOW (ref 8.9–10.3)
Chloride: 100 mmol/L (ref 98–111)
Creatinine, Ser: 1.26 mg/dL — ABNORMAL HIGH (ref 0.61–1.24)
GFR, Estimated: >60 mL/min (ref >60)
Glucose, Bld: 245 mg/dL — ABNORMAL HIGH (ref 70–99)
Potassium: 4.4 mmol/L (ref 3.5–5.1)
Sodium: 136 mmol/L (ref 135–145)

## 2023-12-27 LAB — MAGNESIUM: Magnesium: 1.8 mg/dL (ref 1.7–2.4)

## 2023-12-27 MED ORDER — BISACODYL 5 MG PO TBEC
10.0000 mg | DELAYED_RELEASE_TABLET | Freq: Once | ORAL | Status: AC
  Administered 2023-12-27: 10 mg via ORAL
  Filled 2023-12-27: qty 2

## 2023-12-27 MED ORDER — FUROSEMIDE 40 MG PO TABS
40.0000 mg | ORAL_TABLET | Freq: Every day | ORAL | Status: DC
  Administered 2023-12-28: 40 mg via ORAL
  Filled 2023-12-27: qty 1

## 2023-12-27 MED ORDER — IPRATROPIUM-ALBUTEROL 0.5-2.5 (3) MG/3ML IN SOLN
3.0000 mL | Freq: Three times a day (TID) | RESPIRATORY_TRACT | Status: DC
  Administered 2023-12-28: 3 mL via RESPIRATORY_TRACT
  Filled 2023-12-27: qty 3

## 2023-12-27 NOTE — Telephone Encounter (Signed)
 Pharmacy Patient Advocate Encounter  Received notification from [facility] that Prior Authorization for Kerendia 10MG  tablets  has been APPROVED from 12/27/2023 to 08/21/2024. Ran test claim, Copay is $0.00. This test claim was processed through [facility]- copay amounts may vary at other pharmacies due to pharmacy/plan contracts, or as the patient moves through the different stages of their insurance plan.   PA #/Case ID/Reference #: [id number]

## 2023-12-27 NOTE — TOC Initial Note (Signed)
 Transition of Care [facility]) - Initial/Assessment Note    Patient Details  Name: Jorge Cruz MRN: [id number] Date of Birth: [birth year]  [facility] CM/SW Contact:    Odilia Bennett, LCSW Phone Number: 12/27/2023, 3:50 PM  Clinical Narrative:  Readmission prevention screen complete. CSW met with patient. No family at bedside. CSW introduced role and explained that discharge planning would be discussed. PCP is [facility]. He confirmed he sees a specific provider there but cannot remember his name. He uses a home delivery pharmacy and [address] on [address]. No issues obtaining medications. Patient lives home with his wife. No home health or DME use prior to admission. MD recommending nebulizer machine. No agency preference. CSW ordered through [employer]. Patient confirmed he does not use oxygen at home. No further concerns. CSW will continue to follow patient for support and facilitate return home once stable. Brother will transport him home at discharge.                Expected Discharge Plan: Home/Self Care Barriers to Discharge: Continued Medical Work up   Patient Goals and CMS Choice            Expected Discharge Plan and Services     Post Acute Care Choice: Durable Medical Equipment Living arrangements for the past 2 months: Single Family Home                 DME Arranged: Nebulizer machine DME Agency: [employer] Date DME Agency Contacted: 12/27/23   Representative spoke with at DME Agency: Mitch            Prior Living Arrangements/Services Living arrangements for the past 2 months: Single Family Home Lives with:: Spouse Patient language and need for interpreter reviewed:: Yes Do you feel safe going back to the place where you live?: Yes      Need for Family Participation in Patient Care: Yes (Comment) Care giver support system in place?: Yes (comment)   Criminal Activity/Legal Involvement Pertinent to Current  Situation/Hospitalization: No - Comment as needed  Activities of Daily Living   ADL Screening (condition at time of admission) Independently performs ADLs?: Yes (appropriate for developmental age) Is the patient deaf or have difficulty hearing?: No Does the patient have difficulty seeing, even when wearing glasses/contacts?: No Does the patient have difficulty concentrating, remembering, or making decisions?: No  Permission Sought/Granted Permission sought to share information with : Facility Industrial/product designer granted to share information with : Yes, Verbal Permission Granted              Emotional Assessment Appearance:: Appears stated age Attitude/Demeanor/Rapport: Engaged, Gracious Affect (typically observed): Accepting, Appropriate, Calm, Pleasant Orientation: : Oriented to Self, Oriented to Place, Oriented to  Time, Oriented to Situation Alcohol / Substance Use: Not Applicable Psych Involvement: No (comment)  Admission diagnosis:  COPD exacerbation (HCC) [J44.1] Acute respiratory failure with hypoxia and hypercapnia (HCC) [J96.01, J96.02] Acute on chronic congestive heart failure, unspecified heart failure type (HCC) [I50.9] Acute hypoxic respiratory failure (HCC) [J96.01] Patient Active Problem List   Diagnosis Date Noted   Class 1 obesity 12/26/2023   Acute hypoxic respiratory failure (HCC) 12/25/2023   Demand ischemia (HCC) 12/25/2023   Polysubstance abuse (HCC) 08/10/2023   SOB (shortness of breath) 08/10/2023   Congestive heart failure (HCC) 08/10/2023   S/P implantation of automatic cardioverter/defibrillator (AICD) 08/09/2023   Nonischemic cardiomyopathy (HCC) 02/24/2021   Paroxysmal atrial fibrillation (HCC) 02/24/2021   CKD stage 3a, GFR 45-59 ml/min (  HCC) - baseline scr 1.3    Right hip pain    HFrEF (heart failure with reduced ejection fraction) (HCC) 09/09/2018   Coronary artery disease 05/17/2018   Acute on chronic heart failure with  reduced ejection fraction (HFrEF, EF 25-30% 01/2023) (HCC) 06/09/2017   Tobacco use disorder 06/09/2017   COPD with acute exacerbation (HCC) 04/01/2016   Essential hypertension 04/01/2016   HLD (hyperlipidemia) 04/01/2016   Diabetes (HCC) 03/31/2016   PCP:  [facility] Pharmacy:   [facility] [place]  - [place], [place] - [address] [place] [address] Phone: [phone] Fax: [phone]  [facility] [address], [place] - [address] [place] [address] Phone: [phone] Fax: [phone]     Social Drivers of Health (SDOH) Social History: SDOH Screenings   Food Insecurity: No Food Insecurity (12/27/2023)  Housing: Low Risk  (12/27/2023)  Transportation Needs: No Transportation Needs (12/27/2023)  Utilities: Not At Risk (12/27/2023)  Alcohol Screen: Low Risk  (08/10/2023)  Depression (PHQ2-9): Low Risk  (06/29/2021)  Financial Resource Strain: Low Risk  (08/10/2023)  Social Connections: Moderately Isolated (12/27/2023)  Tobacco Use: High Risk (12/26/2023)   SDOH Interventions: Housing Interventions: Intervention Not Indicated   Readmission Risk Interventions    12/27/2023    3:47 PM  Readmission Risk Prevention Plan  Transportation Screening Complete  Medication Review (RN Care Manager) Complete  PCP or Specialist appointment within 3-5 days of discharge Complete  SW Recovery Care/Counseling Consult Complete  Palliative Care Screening Not Applicable  Skilled Nursing Facility Not Applicable

## 2023-12-27 NOTE — Subjective & Objective (Addendum)
 Patient seen and examined.  Met with pt's brother at the bedside. On RA. He is ready to go home.

## 2023-12-27 NOTE — Progress Notes (Signed)
 Heart Failure Stewardship Pharmacy Note  PCP: [facility] PCP-Cardiologist: Sammy Crisp, MD  HPI: Jorge Cruz is a 73 y.o. male with CAD with history of PCI, history of cocaine use, HFrEF (EF 25 to 30% 01/2023) ischemic cardiomyopathy s/p AICD, paroxysmal A-fib on Eliquis  DM, COPD, nicotine  dependence, HTN who presented with 2 week history of non-productive cough, progressing a week later to dyspnea on exertion and a weight gain of 10 lbs. Treated COPD with steroids and antibiotics outpatient. High salt intake with fast food. BNP on admission is 637.6. HS-troponin on admission is 92. No pulmonary edema noted on CXR.    Pertinent cardiac history: Stress test in 03/2016 with LVEF of <30%, severe LV dilation, and no perfusion defects. Echo at the same time with LVEF of 20-25%, grade I diastolic dysfunction, mild MR. LHC/RHC in 05/2017 showed mild nonobstructive CAD, cardiac index of 1.83 and PCWP of 5. LVEF unchanged in 11/2017. Echo in 09/2017 with LVEF improved to 30-35% and RV low-normal. ICD implanted in 2021. Similar in 07/2021. Echo in 01/2023 showed LVEF down to 25-30% with low-normal RV function.  Pertinent Lab Values: Creatinine, Ser  Date Value Ref Range Status  12/27/2023 1.26 (H) 0.61 - 1.24 mg/dL Final   BUN  Date Value Ref Range Status  12/27/2023 44 (H) 8 - 23 mg/dL Final  09/81/1914 16 8 - 27 mg/dL Final   Potassium  Date Value Ref Range Status  12/27/2023 4.4 3.5 - 5.1 mmol/L Final   Sodium  Date Value Ref Range Status  12/27/2023 136 135 - 145 mmol/L Final  12/12/2023 142 134 - 144 mmol/L Final   B Natriuretic Peptide  Date Value Ref Range Status  12/25/2023 876.4 (H) 0.0 - 100.0 pg/mL Final    Comment:    Performed at [facility], [address] [place], [place] [address]   Magnesium  Date Value Ref Range Status  12/27/2023 1.8 1.7 - 2.4 mg/dL Final    Comment:    Performed at [facility], [address]  [place], [place] [address]   Hgb A1c MFr Bld  Date Value Ref Range Status  08/09/2023 7.2 (H) 4.8 - 5.6 % Final    Comment:    (NOTE) Pre diabetes:          5.7%-6.4%  Diabetes:              >6.4%  Glycemic control for   <7.0% adults with diabetes    TSH  Date Value Ref Range Status  11/24/2017 0.940 0.350 - 4.500 uIU/mL Final    Comment:    Performed by a 3rd Generation assay with a functional sensitivity of <=0.01 uIU/mL. Performed at [facility], [address] [place], [place] [address]     Vital Signs: Admission weight: Temp:  [97.4 F (36.3 C)-98.7 F (37.1 C)] 97.7 F (36.5 C) (05/07 0804) Pulse Rate:  [59-87] 59 (05/07 0804) Cardiac Rhythm: Normal sinus rhythm;Atrial paced (05/07 0700) Resp:  [16-29] 18 (05/07 0450) BP: (98-144)/(54-74) 144/62 (05/07 0804) SpO2:  [91 %-100 %] 100 % (05/07 0804) Weight:  [100.4 kg (221 lb 4.8 oz)-102.7 kg (226 lb 6.6 oz)] 100.4 kg (221 lb 4.8 oz) (05/07 0533)  Intake/Output Summary (Last 24 hours) at 12/27/2023 0947 Last data filed at 12/27/2023 0032 Gross per 24 hour  Intake --  Output 1500 ml  Net -1500 ml    Current Heart Failure Medications:  Loop diuretic: none Beta-Blocker: carvedilol  12.5 mg BID ACEI/ARB/ARNI: Entresto   97/103 mg BID MRA: none SGLT2i: none Other:   Prior to admission Heart Failure Medications:  Loop diuretic: furosemide  20 mg daily Beta-Blocker: carvedilol  6.25 mg BID ACEI/ARB/ARNI: Entresto  97/103 mg BID MRA: none SGLT2i: Farxiga  10 mg daily Other:  Assessment: 1. Acute on chronic systolic heart failure (LVEF 01/2023 25-30%) with low-normal RV function, due to NICM. NYHA class II-III symptoms.  -Symptoms: Reports feeling much improved with diuresis. Denies LEE. Reports mild orthopnea, and mild dyspnea on exertion. Appetite is improved. -Volume: Appears to still be mildly hypervolemic. JVP mildly elevated. Symptoms are not quite at baseline. Weight today is 221.3, down 5 lbs from  admission. Dry weight is ~218 lbs. Creatinine is trending up slightly along with BUN, but Cl and CO2 are stable. Would continue IV diuresis today and consider transiiton to oral tomorrow pending symptoms. -Hemodynamics: BP is somewhat labile, with more recent reading being elevated. HR stable in 60s. -BB: Continue carvedilol  12.5 mg BID. Recently increased. -ACEI/ARB/ARNI: Continue Entresto  97-103 mg BID -MRA: Will check the cost for Kerendia given history of hyperkalemia on spironolactone . Can consider starting outpatient. -SGLT2i: Consider resuming Farxiga .  Plan: 1) Medication changes recommended at this time: -Can consider adding back Farxiga  10 mg daily tomorrow.  2) Patient assistance: -Pending  3) Education: - Patient has been educated on current HF medications and potential additions to HF medication regimen - Patient verbalizes understanding that over the next few months, these medication doses may change and more medications may be added to optimize HF regimen - Patient has been educated on basic disease state pathophysiology and goals of therapy  Medication Assistance / Insurance Benefits Check: Does the patient have prescription insurance?    Type of insurance plan:  Does the patient qualify for medication assistance through manufacturers or grants? Pending  Prior authorization started for Kerendia to check cost  Outpatient Pharmacy: Prior to admission outpatient pharmacy: [facility]      Please do not hesitate to reach out with questions or concerns,  Bevely Brush, PharmD, CPP, BCPS Heart Failure Pharmacist  Phone - [phone] 12/27/2023 9:47 AM

## 2023-12-27 NOTE — Plan of Care (Signed)

## 2023-12-27 NOTE — Progress Notes (Signed)
 PROGRESS NOTE    Jorge Cruz  ZOX:[id number] DOB: Sep 15, 1950 DOA: 12/25/2023 PCP: [facility]  Subjective: Patient seen and examined.  Met the patient's wife and brother at the bedside.  He is doing better.  Eating better.  He has no peripheral edema.  Still smoking at home.  C/o of constipation. No BM in 2 days.   Hospital Course: Jorge Cruz is a 73 y.o. 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  He also has a nonproductive cough. Upon arriving to hospital, he had significant respiratory distress, was placed on BiPAP with 40% oxygen. ABG showed significant CO2 retention, creatinine 1.36, BNP 876, troponin peak at 284. Patient is placed on IV Lasix  and steroids. He is off BiPAP in the morning and 5/6.  HPI: Jorge Cruz is a 73 y.o. 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   Jorge Cruz states he was here in the ED earlier today due to shortness of breath and nonproductive cough that he had been experiencing for approximately 5 days.  During this time, he is also noted fluctuations in his weight and abdominal distention.  His wife at bedside feels that his abdomen appears bigger than normal.  He denies any fever, congestion, nausea, vomiting, diarrhea.  He denies any lower extremity swelling.  He was started on breathing treatments and discharged home with doxycycline .   Jorge Cruz states that after arriving home, his shortness of breath rapidly progressed and became severe.  He began to  experience chest pressure when he was in respiratory distress but notes that the pressure improved when his shortness of breath improved.  He denies any new symptoms after arriving home.   ED course: On arrival to the ED, patient was hypertensive at 158/89 with heart rate of 107.  He was saturating at 99% on BiPAP with 40% FiO2.  He was afebrile at 97.6.  Chest x-ray obtained and pending.  No repeat blood work obtained, however ordered.  Patient given DuoNeb and [facility] contacted for admission.  Significant Events: Admitted 12/25/2023 for acute hypoxic respiratory failure   Significant Labs: WBC 8.5, HgB 13.7, plt 160 Na 136, K 4.3, CO2 of 28, BUN 20, Scr 1.23, glu 236 BNP 876 Mg 2.2 VBG pH 7.26, PCO2 of 75  Significant Imaging Studies: CXR 1. Enlarged heart. 2. No overt edema  Antibiotic Therapy: Anti-infectives (From admission, onward)    None       Procedures:   Consultants:     Assessment and Plan: * Acute hypoxic respiratory failure (HCC) On admission. Patient is presenting with profound shortness of breath and found to be hypoxic as low as 70%, with improvement on BiPAP.  Compared to chest x-ray obtained earlier today, it appears he has now developed pulmonary edema.  Suspect COPD exacerbation is also contributing. - Continue BiPAP - Wean as tolerated - VBG pending  12-27-2023 off bipap. On 3 L/min O2. Sats 100%. No peripheral edema now. Can stop IV lasix . I think most of his SOB now is due to COPD exacerbation. On po prednisone . Needs home nebulizer  machine. Watch overnight. Wean to RA. Possible home in AM.  COPD with acute exacerbation (HCC) On admission. History of COPD, on as needed albuterol  only.  Given diffuse wheezing, suspect COPD is also contributing to current picture. - S/p Solu-Medrol  125 mg once - Start prednisone  40 mg daily tomorrow - DuoNebs every 6 hours - Hold off on antibiotics given low suspicion for bacterial etiology  12-27-2023 off bipap. On 3  L/min O2. Sats 100%. No peripheral edema now. Can stop IV lasix . I think most of his SOB now is due to COPD exacerbation. On po prednisone . Needs home nebulizer machine. Watch overnight. Wean to RA. Possible home in AM.  Acute on chronic heart failure with reduced ejection fraction (HFrEF, EF 25-30% 01/2023) (HCC) On admission. History of HFrEF in the setting of nonischemic cardiomyopathy with last EF of 25-30% in June 2024.  He follows with the advanced heart failure team. Patient presented earlier today with 5 days of shortness of breath and nonproductive cough, in the setting of fluctuating weight and abdominal distention.  Subsequently developed pulmonary edema this afternoon requiring return to the ED and BiPAP. - Telemetry monitoring - Consider advanced heart failure team consultation in the a.m. - Start Lasix  40 mg IV twice daily - BNP pending - Strict ins and out - Daily weights - Daily BMP and magnesium - Continue home GDMT  12-27-2023 off bipap. On 3 L/min O2. Sats 100%. No peripheral edema now. Can stop IV lasix . I think most of his SOB now is due to COPD exacerbation. On po prednisone . Needs home nebulizer machine. Watch overnight. Wean to RA. Possible home in AM.  Class 1 obesity Body mass index is 33.65 kg/m.   Demand ischemia (HCC) On admission. Mildly elevated troponin earlier today in the setting of shortness of breath.  Repeat troponin will be even higher given severe respiratory distress requiring BiPAP.  History of nonobstructive CAD.  Chest pain has resolved with improvement in breathing. - Troponin pending - Hold off on heparin  unless markedly elevated troponin  12-27-2023 stable. No chest pain.  Paroxysmal atrial fibrillation (HCC) On admission. Currently in sinus rhythm on EKG. - Continue home regimen  12-27-2023 stable.  CKD stage 3a, GFR 45-59 ml/min (HCC) - baseline scr 1.3 12-27-2023 stable. baseline scr 1.3  Tobacco use disorder 12-27-2023 pt advised to  stop smoking.  Essential hypertension On admission. On EMS arrival, patient was notably hypertensive above 200 systolics.  Nitropaste was placed in blood pressure has improved close to baseline at this time.  Unclear if hypertensive emergency was contributing to rapid pulmonary edema earlier today. - Hold off on nitroglycerin  infusion at this time given improvement in blood pressure - Continue home regimen  12-27-2023 BP improved with treatment of his COPD exacerbation.  Diabetes (HCC) On admission. History of well-controlled type 2 diabetes with last A1c of 7.2% approximately 4 months ago. - Hold home metformin  - SSI, moderate  12-27-2023 stable. Continue SSI   DVT prophylaxis:  apixaban  (ELIQUIS ) tablet 5 mg     Code Status: Full Code Family Communication: discussed with pt's wife and brother at bedside Disposition Plan: return home Reason for continuing need for hospitalization: weaning off supplemental O2 today. Stopped IV lasix  today. Monitor scr.   Objective: Vitals:   12/27/23 0736 12/27/23 0804 12/27/23 1208 12/27/23 1332  BP:  (!) 144/62 122/63   Pulse:  (!) 59 69   Resp:      Temp:  97.7 F (36.5 C) 97.6 F (36.4 C)  TempSrc:  Oral Oral   SpO2: 99% 100% 98% 98%  Weight:      Height:        Intake/Output Summary (Last 24 hours) at 12/27/2023 1348 Last data filed at 12/27/2023 1056 Gross per 24 hour  Intake 240 ml  Output 1500 ml  Net -1260 ml   Filed Weights   12/25/23 2303 12/26/23 2217 12/27/23 0533  Weight: 102.7 kg 102.7 kg 100.4 kg    Examination:  Physical Exam Vitals and nursing note reviewed.  Constitutional:      General: He is not in acute distress.    Appearance: He is obese. He is not toxic-appearing or diaphoretic.  HENT:     Head: Normocephalic and atraumatic.     Nose: Nose normal.  Eyes:     General: No scleral icterus. Cardiovascular:     Rate and Rhythm: Normal rate and regular rhythm.  Pulmonary:     Effort: Pulmonary effort  is normal. No respiratory distress.     Breath sounds: No wheezing.     Comments: Scattered rhonchi Abdominal:     General: Abdomen is protuberant. Bowel sounds are normal. There is no distension.     Palpations: Abdomen is soft.  Musculoskeletal:     Right lower leg: No edema.     Left lower leg: No edema.  Skin:    General: Skin is warm and dry.     Capillary Refill: Capillary refill takes less than 2 seconds.  Neurological:     Mental Status: He is alert and oriented to person, place, and time.    Data Reviewed: I have personally reviewed following labs and imaging studies  CBC: Recent Labs  Lab 12/25/23 1014 12/25/23 2334  WBC 4.7 8.5  NEUTROABS 2.3 6.9  HGB 14.2 13.7  HCT 43.4 42.8  MCV 98.6 100.2*  PLT 178 160   Basic Metabolic Panel: Recent Labs  Lab 12/25/23 1014 12/25/23 2334 12/25/23 2335 12/27/23 0252  NA 140 136  --  136  K 3.8 4.3  --  4.4  CL 105 101  --  100  CO2 27 28  --  27  GLUCOSE 162* 236*  --  245*  BUN 17 20  --  44*  CREATININE 0.98 1.23  --  1.26*  CALCIUM  8.7* 8.5*  --  8.5*  MG  --   --  2.2 1.8   GFR: Estimated Creatinine Clearance: 60.9 mL/min (A) (by C-G formula based on SCr of 1.26 mg/dL (H)). Liver Function Tests: Recent Labs  Lab 12/25/23 1014  AST 20  ALT 14  ALKPHOS 70  BILITOT 0.7  PROT 6.8  ALBUMIN 3.7   BNP (last 3 results) Recent Labs    08/09/23 1609 12/12/23 0946 12/25/23 2334  BNP 884.9* 733.5* 876.4*   CBG: Recent Labs  Lab 12/26/23 1124 12/26/23 1630 12/26/23 2201 12/27/23 0803 12/27/23 1207  GLUCAP 285* 226* 183* 201* 145*   Recent Results (from the past 240 hours)  Resp panel by RT-PCR (RSV, Flu A&B, Covid) Anterior Nasal Swab     Status: None   Collection Time: 12/25/23  9:36 AM   Specimen: Anterior Nasal Swab  Result Value Ref Range Status   SARS Coronavirus 2 by RT PCR NEGATIVE NEGATIVE Final    Comment: (NOTE) SARS-CoV-2 target nucleic acids are NOT DETECTED.  The SARS-CoV-2 RNA is  generally detectable in upper respiratory specimens during the acute phase of infection. The lowest concentration of SARS-CoV-2 viral copies this assay  can detect is 138 copies/mL. A negative result does not preclude SARS-Cov-2 infection and should not be used as the sole basis for treatment or other patient management decisions. A negative result may occur with  improper specimen collection/handling, submission of specimen other than nasopharyngeal swab, presence of viral mutation(s) within the areas targeted by this assay, and inadequate number of viral copies(<138 copies/mL). A negative result must be combined with clinical observations, patient history, and epidemiological information. The expected result is Negative.  Fact Sheet for Patients:  BloggerCourse.com  Fact Sheet for Healthcare Providers:  SeriousBroker.it  This test is no t yet approved or cleared by the [place]  FDA and  has been authorized for detection and/or diagnosis of SARS-CoV-2 by FDA under an Emergency Use Authorization (EUA). This EUA will remain  in effect (meaning this test can be used) for the duration of the COVID-19 declaration under Section 564(b)(1) of the Act, 21 U.S.C.section 360bbb-3(b)(1), unless the authorization is terminated  or revoked sooner.       Influenza A by PCR NEGATIVE NEGATIVE Final   Influenza B by PCR NEGATIVE NEGATIVE Final    Comment: (NOTE) The Xpert Xpress SARS-CoV-2/FLU/RSV plus assay is intended as an aid in the diagnosis of influenza from Nasopharyngeal swab specimens and should not be used as a sole basis for treatment. Nasal washings and aspirates are unacceptable for Xpert Xpress SARS-CoV-2/FLU/RSV testing.  Fact Sheet for Patients: BloggerCourse.com  Fact Sheet for Healthcare Providers: SeriousBroker.it  This test is not yet approved or cleared by the [place] FDA and has been authorized for detection and/or diagnosis of SARS-CoV-2 by FDA under an Emergency Use Authorization (EUA). This EUA will remain in effect (meaning this test can be used) for the duration of the COVID-19 declaration under Section 564(b)(1) of the Act, 21 U.S.C. section 360bbb-3(b)(1), unless the authorization is terminated or revoked.     Resp Syncytial Virus by PCR NEGATIVE NEGATIVE Final    Comment: (NOTE) Fact Sheet for Patients: BloggerCourse.com  Fact Sheet for Healthcare Providers: SeriousBroker.it  This test is not yet approved or cleared by the [place]  FDA and has been authorized for detection and/or diagnosis of SARS-CoV-2 by FDA under an Emergency Use Authorization (EUA). This EUA will remain in effect (meaning this test can be used) for the duration of the COVID-19 declaration under Section 564(b)(1) of the Act, 21 U.S.C. section 360bbb-3(b)(1), unless the authorization is terminated or revoked.  Performed at [facility], [address], [place], [place] [address]     Radiology Studies: DG Chest Portable 1 View Result Date: 12/25/2023 CLINICAL DATA:  Shortness of breath EXAM: PORTABLE CHEST 1 VIEW COMPARISON:  12/25/2023 FINDINGS: Left AICD remains in place, unchanged. Cardiomegaly with vascular congestion and interstitial prominence, likely interstitial edema. No effusions or acute bony abnormality. IMPRESSION: Cardiomegaly with vascular congestion and mild interstitial edema. Electronically Signed   By: Janeece Mechanic M.D.   On: 12/25/2023 22:56   Scheduled Meds:  allopurinol   100 mg Oral Daily   apixaban   5 mg Oral BID   atorvastatin   80 mg Oral Daily   carvedilol   12.5 mg Oral BID WC   [START ON 12/28/2023] furosemide   40 mg Oral Daily   insulin  aspart  0-15 Units Subcutaneous TID WC   ipratropium-albuterol   3 mL Nebulization Q6H   predniSONE   40 mg Oral Q breakfast    sacubitril -valsartan   1 tablet Oral BID   sodium chloride  flush  3 mL Intravenous Q12H  Continuous Infusions:   LOS: 2 days   Time spent: 50 minutes  Unk Garb, DO  [facility]  12/27/2023, 1:48 PM

## 2023-12-27 NOTE — Assessment & Plan Note (Signed)
 12-27-2023 pt advised to stop smoking.  12-28-2023 pt to ask PCP about Chantix.

## 2023-12-27 NOTE — Assessment & Plan Note (Signed)
 12-27-2023 stable. baseline scr 1.3  12-28-2023 Stable. DC to home

## 2023-12-27 NOTE — Inpatient Diabetes Management (Signed)
 Inpatient Diabetes Program Recommendations  AACE/ADA: New Consensus Statement on Inpatient Glycemic Control (2015)  Target Ranges:  Prepandial:   less than 140 mg/dL      Peak postprandial:   less than 180 mg/dL (1-2 hours)      Critically ill patients:  140 - 180 mg/dL    Latest Reference Range & Units 12/26/23 08:22 12/26/23 11:24 12/26/23 16:30 12/26/23 22:01  Glucose-Capillary 70 - 99 mg/dL 621 (H)  3 units Novolog   285 (H)  8 units Novolog   226 (H)  5 units Novolog   183 (H)  (H): Data is abnormally high  Latest Reference Range & Units 12/27/23 08:03  Glucose-Capillary 70 - 99 mg/dL 308 (H)  5 units Novolog    (H): Data is abnormally high     Home DM Meds: Farxiga  10 mg daily       Metformin  1000 mg BID  Current Orders: Novolog  Moderate Correction Scale/ SSI (0-15 units) TID AC    MD- Note pt getting Prednisone  40 mg daily  CBGs >200  Please consider:  1. Start weight based basal insulin : Semglee  5 units at bedtime (0.05 units/kg)  2. Start Novolog  Meal Coverage: Novolog  3 units TID with meals HOLD if pt NPO HOLD if pt eats <50% meals     --Will follow patient during hospitalization--  Langston Pippins RN, MSN, CDCES Diabetes Coordinator Inpatient Glycemic Control Team Team Pager: [phone] (8a-5p)

## 2023-12-27 NOTE — Telephone Encounter (Signed)
 Patient Product/process development scientist completed.    The patient is insured through [facility]. Patient has Medicare and is not eligible for a copay card, but may be able to apply for patient assistance or Medicare RX Payment Plan (Patient Must reach out to their plan, if eligible for payment plan), if available.    Ran test claim for Kerendia 10 mg and Requires Prior Authorization  Ran test claim for Farxiga  10 mg and was filled last on 12/13/2023 can fill again on 01/05/2024  This test claim was processed through [facility]- copay amounts may vary at other pharmacies due to [employer], or as the patient moves through the different stages of their insurance plan.     Morgan Arab, CPHT Pharmacy Technician III Certified Patient Advocate [facility] Patient Advocate Team Direct Number: [phone]  Fax: [phone]

## 2023-12-27 NOTE — Assessment & Plan Note (Signed)
Body mass index is 33.65 kg/m.

## 2023-12-27 NOTE — Telephone Encounter (Signed)
 Pharmacy Patient Advocate Encounter   Received notification from Inpatient Request that prior authorization for Kerendia 10MG  tablets is required/requested.   Insurance verification completed.   The patient is insured through [employer] .   Per test claim: PA required; PA submitted to above mentioned insurance via CoverMyMeds Key/confirmation #/[id number] Status is pending

## 2023-12-28 DIAGNOSIS — I5023 Acute on chronic systolic (congestive) heart failure: Secondary | ICD-10-CM | POA: Diagnosis not present

## 2023-12-28 DIAGNOSIS — N1831 Chronic kidney disease, stage 3a: Secondary | ICD-10-CM | POA: Diagnosis not present

## 2023-12-28 DIAGNOSIS — J9601 Acute respiratory failure with hypoxia: Secondary | ICD-10-CM | POA: Diagnosis not present

## 2023-12-28 DIAGNOSIS — J441 Chronic obstructive pulmonary disease with (acute) exacerbation: Secondary | ICD-10-CM | POA: Diagnosis not present

## 2023-12-28 LAB — BASIC METABOLIC PANEL WITH GFR
Anion gap: 8 (ref 5–15)
BUN: 46 mg/dL — ABNORMAL HIGH (ref 8–23)
CO2: 31 mmol/L (ref 22–32)
Calcium: 8.6 mg/dL — ABNORMAL LOW (ref 8.9–10.3)
Chloride: 100 mmol/L (ref 98–111)
Creatinine, Ser: 1.08 mg/dL (ref 0.61–1.24)
GFR, Estimated: >60 mL/min (ref >60)
Glucose, Bld: 176 mg/dL — ABNORMAL HIGH (ref 70–99)
Potassium: 4.5 mmol/L (ref 3.5–5.1)
Sodium: 139 mmol/L (ref 135–145)

## 2023-12-28 LAB — BLOOD GAS, VENOUS
O2 Saturation: 50.1 mmol/L — ABNORMAL HIGH (ref 20.0–28.0)
Patient temperature: 37
Patient temperature: 37 %
Patient temperature: 50.1 mmol/L — ABNORMAL HIGH (ref 0.0–2.0)
pH, Ven: 7.26 (ref 7.25–7.43)
pH, Ven: 75 mmHg (ref 7.25–7.43)
pO2, Ven: 33.7 mmol/L — ABNORMAL HIGH (ref 32–45)
pO2, Ven: 75 mmol/L (ref 44–60)

## 2023-12-28 LAB — GLUCOSE, CAPILLARY
Glucose-Capillary: 167 mg/dL — ABNORMAL HIGH (ref 70–99)
Glucose-Capillary: 172 mg/dL — ABNORMAL HIGH (ref 70–99)

## 2023-12-28 LAB — MAGNESIUM: Magnesium: 2.2 mg/dL (ref 1.7–2.4)

## 2023-12-28 MED ORDER — PREDNISONE 20 MG PO TABS: 40.0000 mg | ORAL_TABLET | Freq: Every day | ORAL | 0 refills | Status: AC

## 2023-12-28 MED ORDER — IPRATROPIUM-ALBUTEROL 0.5-2.5 (3) MG/3ML IN SOLN: RESPIRATORY_TRACT | 0 refills | Status: AC

## 2023-12-28 NOTE — Progress Notes (Signed)
 PROGRESS NOTE    Jorge Cruz  ZOX:[id number] DOB: 04-01-1951 DOA: 12/25/2023 PCP: [facility]  Subjective: Patient seen and examined.  Met with pt's brother at the bedside. On RA. He is ready to go home.   Hospital Course: Jorge Cruz is a 73 y.o. 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  He also has a nonproductive cough. Upon arriving to hospital, he had significant respiratory distress, was placed on BiPAP with 40% oxygen. ABG showed significant CO2 retention, creatinine 1.36, BNP 876, troponin peak at 284. Patient is placed on IV Lasix  and steroids. He is off BiPAP in the morning and 5/6.  HPI: Jorge Cruz is a 73 y.o. 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   Jorge Cruz states he was here in the ED earlier today due to shortness of breath and nonproductive cough that he had been experiencing for approximately 5 days.  During this time, he is also noted fluctuations in his weight and abdominal distention.  His wife at bedside feels that his abdomen appears bigger than normal.  He denies any fever, congestion, nausea, vomiting, diarrhea.  He denies any lower extremity swelling.  He was started on breathing treatments and discharged home with doxycycline .   Jorge Cruz states that after arriving home, his shortness of breath rapidly progressed and became severe.  He began to experience chest pressure when he was in respiratory distress but notes that the pressure improved when his  shortness of breath improved.  He denies any new symptoms after arriving home.   ED course: On arrival to the ED, patient was hypertensive at 158/89 with heart rate of 107.  He was saturating at 99% on BiPAP with 40% FiO2.  He was afebrile at 97.6.  Chest x-ray obtained and pending.  No repeat blood work obtained, however ordered.  Patient given DuoNeb and [facility] contacted for admission.  Significant Events: Admitted 12/25/2023 for acute hypoxic respiratory failure   Significant Labs: WBC 8.5, HgB 13.7, plt 160 Na 136, K 4.3, CO2 of 28, BUN 20, Scr 1.23, glu 236 BNP 876 Mg 2.2 VBG pH 7.26, PCO2 of 75  Significant Imaging Studies: CXR 1. Enlarged heart. 2. No overt edema  Antibiotic Therapy: Anti-infectives (From admission, onward)    None       Procedures:   Consultants:     Assessment and Plan: * Acute hypoxic respiratory failure (HCC) On admission. Patient is presenting with profound shortness of breath and found to be hypoxic as low as 70%, with improvement on BiPAP.  Compared to chest x-ray obtained earlier today, it appears he has now developed pulmonary edema.  Suspect COPD exacerbation is also contributing. - Continue BiPAP - Wean as tolerated - VBG pending  12-27-2023 off bipap. On 3 L/min O2. Sats 100%. No peripheral edema now. Can stop IV lasix . I think most of his SOB now is due to COPD exacerbation. On po prednisone . Needs home nebulizer machine. Watch overnight. Wean to RA. Possible home in AM.  12-28-2023 weaned to RA. Stable. DC to home   COPD  with acute exacerbation (HCC) On admission. History of COPD, on as needed albuterol  only.  Given diffuse wheezing, suspect COPD is also contributing to current picture. - S/p Solu-Medrol  125 mg once - Start prednisone  40 mg daily tomorrow - DuoNebs every 6 hours - Hold off on antibiotics given low suspicion for bacterial etiology  12-27-2023 off bipap. On 3 L/min O2. Sats 100%. No peripheral edema now. Can stop IV  lasix . I think most of his SOB now is due to COPD exacerbation. On po prednisone . Needs home nebulizer machine. Watch overnight. Wean to RA. Possible home in AM.  12-28-2023 weaned to RA. Stable. DC to home with home nebs machine, duonebs, 7 days of prednisone . No need for abx.  Acute on chronic heart failure with reduced ejection fraction (HFrEF, EF 25-30% 01/2023) (HCC) On admission. History of HFrEF in the setting of nonischemic cardiomyopathy with last EF of 25-30% in June 2024.  He follows with the advanced heart failure team. Patient presented earlier today with 5 days of shortness of breath and nonproductive cough, in the setting of fluctuating weight and abdominal distention.  Subsequently developed pulmonary edema this afternoon requiring return to the ED and BiPAP. - Telemetry monitoring - Consider advanced heart failure team consultation in the a.m. - Start Lasix  40 mg IV twice daily - BNP pending - Strict ins and out - Daily weights - Daily BMP and magnesium - Continue home GDMT  12-27-2023 off bipap. On 3 L/min O2. Sats 100%. No peripheral edema now. Can stop IV lasix . I think most of his SOB now is due to COPD exacerbation. On po prednisone . Needs home nebulizer machine. Watch overnight. Wean to RA. Possible home in AM.  12-28-2023 stable. Now. Continue home GDMT.  Class 1 obesity Body mass index is 33.65 kg/m.   Demand ischemia (HCC) On admission. Mildly elevated troponin earlier today in the setting of shortness of breath.  Repeat troponin will be even higher given severe respiratory distress requiring BiPAP.  History of nonobstructive CAD.  Chest pain has resolved with improvement in breathing. - Troponin pending - Hold off on heparin  unless markedly elevated troponin  12-27-2023 stable. No chest pain.  12-28-2023 Stable. DC to home   Paroxysmal atrial fibrillation (HCC) On admission. Currently in sinus rhythm on EKG. - Continue home regimen  12-27-2023  stable.  12-28-2023 Stable. DC to home   CKD stage 3a, GFR 45-59 ml/min (HCC) - baseline scr 1.3 12-27-2023 stable. baseline scr 1.3  12-28-2023 Stable. DC to home   Tobacco use disorder 12-27-2023 pt advised to stop smoking.  12-28-2023 pt to ask PCP about Chantix.  Essential hypertension On admission. On EMS arrival, patient was notably hypertensive above 200 systolics.  Nitropaste was placed in blood pressure has improved close to baseline at this time.  Unclear if hypertensive emergency was contributing to rapid pulmonary edema earlier today. - Hold off on nitroglycerin  infusion at this time given improvement in blood pressure - Continue home regimen  12-27-2023 BP improved with treatment of his COPD exacerbation.  12-28-2023 Stable. DC to home   Diabetes [facility]) On admission. History of well-controlled type 2 diabetes with last A1c of 7.2% approximately 4 months ago. - Hold home metformin  - SSI, moderate  12-27-2023 stable. Continue SSI  12-28-2023 Stable. DC to home   DVT prophylaxis:  apixaban  (ELIQUIS ) tablet 5 mg     Code Status: Full Code Family Communication: discussed with pt and pt's brother at bedside Disposition Plan: return home Reason for continuing  need for hospitalization: stable for DC.  Objective: Vitals:   12/28/23 0718 12/28/23 0805 12/28/23 0819 12/28/23 1223  BP:  (!) 113/43  130/61  Pulse:  63  71  Resp:  19  19  Temp:  97.7 F (36.5 C)  99 F (37.2 C)  TempSrc:      SpO2: 100% 100% 98% 94%  Weight:      Height:        Intake/Output Summary (Last 24 hours) at 12/28/2023 1239 Last data filed at 12/28/2023 1100 Gross per 24 hour  Intake 480 ml  Output 450 ml  Net 30 ml   Filed Weights   12/26/23 2217 12/27/23 0533 12/28/23 0510  Weight: 102.7 kg 100.4 kg 100 kg    Examination:  Physical Exam Vitals and nursing note reviewed.  Constitutional:      General: He is not in acute distress.    Appearance: He is obese. He is not  toxic-appearing or diaphoretic.  HENT:     Head: Normocephalic and atraumatic.  Eyes:     General: No scleral icterus. Cardiovascular:     Rate and Rhythm: Normal rate and regular rhythm.  Pulmonary:     Effort: Pulmonary effort is normal. No respiratory distress.     Breath sounds: Normal breath sounds. No wheezing or rales.  Abdominal:     General: Bowel sounds are normal. There is no distension.     Palpations: Abdomen is soft.  Musculoskeletal:     Right lower leg: No edema.     Left lower leg: No edema.  Skin:    General: Skin is warm and dry.     Capillary Refill: Capillary refill takes less than 2 seconds.  Neurological:     Mental Status: He is alert and oriented to person, place, and time.     Data Reviewed: I have personally reviewed following labs and imaging studies  CBC: Recent Labs  Lab 12/25/23 1014 12/25/23 2334  WBC 4.7 8.5  NEUTROABS 2.3 6.9  HGB 14.2 13.7  HCT 43.4 42.8  MCV 98.6 100.2*  PLT 178 160   Basic Metabolic Panel: Recent Labs  Lab 12/25/23 1014 12/25/23 2334 12/25/23 2335 12/27/23 0252 12/28/23 0409  NA 140 136  --  136 139  K 3.8 4.3  --  4.4 4.5  CL 105 101  --  100 100  CO2 27 28  --  27 31  GLUCOSE 162* 236*  --  245* 176*  BUN 17 20  --  44* 46*  CREATININE 0.98 1.23  --  1.26* 1.08  CALCIUM  8.7* 8.5*  --  8.5* 8.6*  MG  --   --  2.2 1.8 2.2   GFR: Estimated Creatinine Clearance: 70.8 mL/min (by C-G formula based on SCr of 1.08 mg/dL). Liver Function Tests: Recent Labs  Lab 12/25/23 1014  AST 20  ALT 14  ALKPHOS 70  BILITOT 0.7  PROT 6.8  ALBUMIN 3.7   BNP (last 3 results) Recent Labs    08/09/23 1609 12/12/23 0946 12/25/23 2334  BNP 884.9* 733.5* 876.4*   CBG: Recent Labs  Lab 12/27/23 1207 12/27/23 1701 12/27/23 2119 12/28/23 0808 12/28/23 1225  GLUCAP 145* 218* 195* 167* 172*    Recent Results (from the past 240 hours)  Resp panel by RT-PCR (RSV, Flu A&B, Covid) Anterior Nasal Swab      Status: None   Collection Time: 12/25/23  9:36 AM   Specimen: Anterior Nasal Swab  Result Value Ref  Range Status   SARS Coronavirus 2 by RT PCR NEGATIVE NEGATIVE Final    Comment: (NOTE) SARS-CoV-2 target nucleic acids are NOT DETECTED.  The SARS-CoV-2 RNA is generally detectable in upper respiratory specimens during the acute phase of infection. The lowest concentration of SARS-CoV-2 viral copies this assay can detect is 138 copies/mL. A negative result does not preclude SARS-Cov-2 infection and should not be used as the sole basis for treatment or other patient management decisions. A negative result may occur with  improper specimen collection/handling, submission of specimen other than nasopharyngeal swab, presence of viral mutation(s) within the areas targeted by this assay, and inadequate number of viral copies(<138 copies/mL). A negative result must be combined with clinical observations, patient history, and epidemiological information. The expected result is Negative.  Fact Sheet for Patients:  BloggerCourse.com  Fact Sheet for Healthcare Providers:  SeriousBroker.it  This test is no t yet approved or cleared by the [place]  FDA and  has been authorized for detection and/or diagnosis of SARS-CoV-2 by FDA under an Emergency Use Authorization (EUA). This EUA will remain  in effect (meaning this test can be used) for the duration of the COVID-19 declaration under Section 564(b)(1) of the Act, 21 U.S.C.section 360bbb-3(b)(1), unless the authorization is terminated  or revoked sooner.       Influenza A by PCR NEGATIVE NEGATIVE Final   Influenza B by PCR NEGATIVE NEGATIVE Final    Comment: (NOTE) The Xpert Xpress SARS-CoV-2/FLU/RSV plus assay is intended as an aid in the diagnosis of influenza from Nasopharyngeal swab specimens and should not be used as a sole basis for treatment. Nasal washings and aspirates are  unacceptable for Xpert Xpress SARS-CoV-2/FLU/RSV testing.  Fact Sheet for Patients: BloggerCourse.com  Fact Sheet for Healthcare Providers: SeriousBroker.it  This test is not yet approved or cleared by the [place]  FDA and has been authorized for detection and/or diagnosis of SARS-CoV-2 by FDA under an Emergency Use Authorization (EUA). This EUA will remain in effect (meaning this test can be used) for the duration of the COVID-19 declaration under Section 564(b)(1) of the Act, 21 U.S.C. section 360bbb-3(b)(1), unless the authorization is terminated or revoked.     Resp Syncytial Virus by PCR NEGATIVE NEGATIVE Final    Comment: (NOTE) Fact Sheet for Patients: BloggerCourse.com  Fact Sheet for Healthcare Providers: SeriousBroker.it  This test is not yet approved or cleared by the [place]  FDA and has been authorized for detection and/or diagnosis of SARS-CoV-2 by FDA under an Emergency Use Authorization (EUA). This EUA will remain in effect (meaning this test can be used) for the duration of the COVID-19 declaration under Section 564(b)(1) of the Act, 21 U.S.C. section 360bbb-3(b)(1), unless the authorization is terminated or revoked.  Performed at [facility], [address] [place], [place] [address]      Radiology Studies: No results found.  Scheduled Meds:  allopurinol   100 mg Oral Daily   apixaban   5 mg Oral BID   atorvastatin   80 mg Oral Daily   carvedilol   12.5 mg Oral BID WC   furosemide   40 mg Oral Daily   insulin  aspart  0-15 Units Subcutaneous TID WC   ipratropium-albuterol   3 mL Nebulization TID   predniSONE   40 mg Oral Q breakfast   sacubitril -valsartan   1 tablet Oral BID   sodium chloride  flush  3 mL Intravenous Q12H   Continuous Infusions:   LOS: 3 days   Time spent: 50 minutes  Unk Garb, DO  [facility]  12/28/2023, 12:39 PM

## 2023-12-28 NOTE — Plan of Care (Signed)

## 2023-12-28 NOTE — TOC Transition Note (Signed)
 Transition of Care [facility]) - Discharge Note   Patient Details  Name: Jorge Cruz MRN: [id number] Date of Birth: Oct 24, 1950  Transition of Care [facility]) CM/SW Contact:  Odilia Bennett, LCSW Phone Number: 12/28/2023, 12:39 PM   Clinical Narrative:  Patient has orders to discharge home today. Nebulizer was delivered yesterday afternoon. No further concerns. CSW signing off.   Final next level of care: Home/Self Care Barriers to Discharge: Continued Medical Work up   Patient Goals and CMS Choice            Discharge Placement                Patient to be transferred to facility by: Brother   Patient and family notified of of transfer: 12/28/23  Discharge Plan and Services Additional resources added to the After Visit Summary for       Post Acute Care Choice: Durable Medical Equipment          DME Arranged: Nebulizer machine DME Agency: [employer] Date DME Agency Contacted: 12/27/23   Representative spoke with at DME Agency: Harriet Limber            Social Drivers of Health (SDOH) Interventions SDOH Screenings   Food Insecurity: No Food Insecurity (12/27/2023)  Housing: Low Risk  (12/27/2023)  Transportation Needs: No Transportation Needs (12/27/2023)  Utilities: Not At Risk (12/27/2023)  Alcohol Screen: Low Risk  (08/10/2023)  Depression (PHQ2-9): Low Risk  (06/29/2021)  Financial Resource Strain: Low Risk  (08/10/2023)  Social Connections: Moderately Isolated (12/27/2023)  Tobacco Use: High Risk (12/26/2023)     Readmission Risk Interventions    12/27/2023    3:47 PM  Readmission Risk Prevention Plan  Transportation Screening Complete  Medication Review (RN Care Manager) Complete  PCP or Specialist appointment within 3-5 days of discharge Complete  SW Recovery Care/Counseling Consult Complete  Palliative Care Screening Not Applicable  Skilled Nursing Facility Not Applicable

## 2023-12-28 NOTE — Plan of Care (Signed)
 Problem: Education: Goal: Ability to describe self-care measures that may prevent or decrease complications (Diabetes Survival Skills Education) will improve 12/28/2023 1238 by Albertha Alosa, RN Outcome: Progressing 12/28/2023 1238 by Albertha Alosa, RN Outcome: Adequate for Discharge Goal: Individualized Educational Video(s) 12/28/2023 1238 by Albertha Alosa, RN Outcome: Progressing 12/28/2023 1238 by Albertha Alosa, RN Outcome: Adequate for Discharge   Problem: Coping: Goal: Ability to adjust to condition or change in health will improve 12/28/2023 1238 by Albertha Alosa, RN Outcome: Progressing 12/28/2023 1238 by Albertha Alosa, RN Outcome: Adequate for Discharge   Problem: Fluid Volume: Goal: Ability to maintain a balanced intake and output will improve 12/28/2023 1238 by Albertha Alosa, RN Outcome: Progressing 12/28/2023 1238 by Albertha Alosa, RN Outcome: Adequate for Discharge   Problem: Health Behavior/Discharge Planning: Goal: Ability to identify and utilize available resources and services will improve 12/28/2023 1238 by Albertha Alosa, RN Outcome: Progressing 12/28/2023 1238 by Albertha Alosa, RN Outcome: Adequate for Discharge Goal: Ability to manage health-related needs will improve 12/28/2023 1238 by Albertha Alosa, RN Outcome: Progressing 12/28/2023 1238 by Albertha Alosa, RN Outcome: Adequate for Discharge   Problem: Metabolic: Goal: Ability to maintain appropriate glucose levels will improve 12/28/2023 1238 by Albertha Alosa, RN Outcome: Progressing 12/28/2023 1238 by Albertha Alosa, RN Outcome: Adequate for Discharge   Problem: Nutritional: Goal: Maintenance of adequate nutrition will improve 12/28/2023 1238 by Albertha Alosa, RN Outcome: Progressing 12/28/2023 1238 by Albertha Alosa, RN Outcome: Adequate for Discharge Goal: Progress toward achieving an optimal weight will improve 12/28/2023 1238 by Albertha Alosa, RN Outcome: Progressing 12/28/2023 1238 by  Albertha Alosa, RN Outcome: Adequate for Discharge   Problem: Skin Integrity: Goal: Risk for impaired skin integrity will decrease 12/28/2023 1238 by Albertha Alosa, RN Outcome: Progressing 12/28/2023 1238 by Albertha Alosa, RN Outcome: Adequate for Discharge   Problem: Tissue Perfusion: Goal: Adequacy of tissue perfusion will improve 12/28/2023 1238 by Albertha Alosa, RN Outcome: Progressing 12/28/2023 1238 by Albertha Alosa, RN Outcome: Adequate for Discharge   Problem: Education: Goal: Knowledge of General Education information will improve Description: Including pain rating scale, medication(s)/side effects and non-pharmacologic comfort measures 12/28/2023 1238 by Albertha Alosa, RN Outcome: Progressing 12/28/2023 1238 by Albertha Alosa, RN Outcome: Adequate for Discharge   Problem: Health Behavior/Discharge Planning: Goal: Ability to manage health-related needs will improve 12/28/2023 1238 by Albertha Alosa, RN Outcome: Progressing 12/28/2023 1238 by Albertha Alosa, RN Outcome: Adequate for Discharge   Problem: Clinical Measurements: Goal: Ability to maintain clinical measurements within normal limits will improve 12/28/2023 1238 by Albertha Alosa, RN Outcome: Progressing 12/28/2023 1238 by Albertha Alosa, RN Outcome: Adequate for Discharge Goal: Will remain free from infection 12/28/2023 1238 by Albertha Alosa, RN Outcome: Progressing 12/28/2023 1238 by Albertha Alosa, RN Outcome: Adequate for Discharge Goal: Diagnostic test results will improve 12/28/2023 1238 by Albertha Alosa, RN Outcome: Progressing 12/28/2023 1238 by Albertha Alosa, RN Outcome: Adequate for Discharge Goal: Respiratory complications will improve 12/28/2023 1238 by Albertha Alosa, RN Outcome: Progressing 12/28/2023 1238 by Albertha Alosa, RN Outcome: Adequate for Discharge Goal: Cardiovascular complication will be avoided 12/28/2023 1238 by Albertha Alosa, RN Outcome: Progressing 12/28/2023 1238 by  Albertha Alosa, RN Outcome: Adequate for Discharge   Problem: Activity: Goal: Risk for activity intolerance will decrease 12/28/2023 1238 by Albertha Alosa, RN Outcome: Progressing 12/28/2023 1238  by Albertha Alosa, RN Outcome: Adequate for Discharge   Problem: Nutrition: Goal: Adequate nutrition will be maintained 12/28/2023 1238 by Albertha Alosa, RN Outcome: Progressing 12/28/2023 1238 by Albertha Alosa, RN Outcome: Adequate for Discharge   Problem: Coping: Goal: Level of anxiety will decrease 12/28/2023 1238 by Albertha Alosa, RN Outcome: Progressing 12/28/2023 1238 by Albertha Alosa, RN Outcome: Adequate for Discharge   Problem: Elimination: Goal: Will not experience complications related to bowel motility 12/28/2023 1238 by Albertha Alosa, RN Outcome: Progressing 12/28/2023 1238 by Albertha Alosa, RN Outcome: Adequate for Discharge Goal: Will not experience complications related to urinary retention 12/28/2023 1238 by Albertha Alosa, RN Outcome: Progressing 12/28/2023 1238 by Albertha Alosa, RN Outcome: Adequate for Discharge   Problem: Pain Managment: Goal: General experience of comfort will improve and/or be controlled 12/28/2023 1238 by Albertha Alosa, RN Outcome: Progressing 12/28/2023 1238 by Albertha Alosa, RN Outcome: Adequate for Discharge   Problem: Safety: Goal: Ability to remain free from injury will improve 12/28/2023 1238 by Albertha Alosa, RN Outcome: Progressing 12/28/2023 1238 by Albertha Alosa, RN Outcome: Adequate for Discharge   Problem: Skin Integrity: Goal: Risk for impaired skin integrity will decrease 12/28/2023 1238 by Albertha Alosa, RN Outcome: Progressing 12/28/2023 1238 by Albertha Alosa, RN Outcome: Adequate for Discharge   Problem: Education: Goal: Knowledge of disease or condition will improve 12/28/2023 1238 by Albertha Alosa, RN Outcome: Progressing 12/28/2023 1238 by Albertha Alosa, RN Outcome: Adequate for Discharge Goal:  Knowledge of the prescribed therapeutic regimen will improve 12/28/2023 1238 by Albertha Alosa, RN Outcome: Progressing 12/28/2023 1238 by Albertha Alosa, RN Outcome: Adequate for Discharge Goal: Individualized Educational Video(s) 12/28/2023 1238 by Albertha Alosa, RN Outcome: Progressing 12/28/2023 1238 by Albertha Alosa, RN Outcome: Adequate for Discharge   Problem: Activity: Goal: Ability to tolerate increased activity will improve 12/28/2023 1238 by Albertha Alosa, RN Outcome: Progressing 12/28/2023 1238 by Albertha Alosa, RN Outcome: Adequate for Discharge Goal: Will verbalize the importance of balancing activity with adequate rest periods 12/28/2023 1238 by Albertha Alosa, RN Outcome: Progressing 12/28/2023 1238 by Albertha Alosa, RN Outcome: Adequate for Discharge   Problem: Respiratory: Goal: Ability to maintain a clear airway will improve 12/28/2023 1238 by Albertha Alosa, RN Outcome: Progressing 12/28/2023 1238 by Albertha Alosa, RN Outcome: Adequate for Discharge Goal: Levels of oxygenation will improve 12/28/2023 1238 by Albertha Alosa, RN Outcome: Progressing 12/28/2023 1238 by Albertha Alosa, RN Outcome: Adequate for Discharge Goal: Ability to maintain adequate ventilation will improve 12/28/2023 1238 by Albertha Alosa, RN Outcome: Progressing 12/28/2023 1238 by Albertha Alosa, RN Outcome: Adequate for Discharge

## 2023-12-28 NOTE — Plan of Care (Signed)
   Problem: Education: Goal: Ability to describe self-care measures that may prevent or decrease complications (Diabetes Survival Skills Education) will improve Outcome: Adequate for Discharge Goal: Individualized Educational Video(s) Outcome: Adequate for Discharge   Problem: Coping: Goal: Ability to adjust to condition or change in health will improve Outcome: Adequate for Discharge   Problem: Fluid Volume: Goal: Ability to maintain a balanced intake and output will improve Outcome: Adequate for Discharge   Problem: Health Behavior/Discharge Planning: Goal: Ability to identify and utilize available resources and services will improve Outcome: Adequate for Discharge Goal: Ability to manage health-related needs will improve Outcome: Adequate for Discharge   Problem: Metabolic: Goal: Ability to maintain appropriate glucose levels will improve Outcome: Adequate for Discharge   Problem: Nutritional: Goal: Maintenance of adequate nutrition will improve Outcome: Adequate for Discharge Goal: Progress toward achieving an optimal weight will improve Outcome: Adequate for Discharge   Problem: Skin Integrity: Goal: Risk for impaired skin integrity will decrease Outcome: Adequate for Discharge   Problem: Tissue Perfusion: Goal: Adequacy of tissue perfusion will improve Outcome: Adequate for Discharge   Problem: Education: Goal: Knowledge of General Education information will improve Description: Including pain rating scale, medication(s)/side effects and non-pharmacologic comfort measures Outcome: Adequate for Discharge   Problem: Health Behavior/Discharge Planning: Goal: Ability to manage health-related needs will improve Outcome: Adequate for Discharge   Problem: Clinical Measurements: Goal: Ability to maintain clinical measurements within normal limits will improve Outcome: Adequate for Discharge Goal: Will remain free from infection Outcome: Adequate for Discharge Goal:  Diagnostic test results will improve Outcome: Adequate for Discharge Goal: Respiratory complications will improve Outcome: Adequate for Discharge Goal: Cardiovascular complication will be avoided Outcome: Adequate for Discharge   Problem: Activity: Goal: Risk for activity intolerance will decrease Outcome: Adequate for Discharge   Problem: Nutrition: Goal: Adequate nutrition will be maintained Outcome: Adequate for Discharge   Problem: Coping: Goal: Level of anxiety will decrease Outcome: Adequate for Discharge   Problem: Elimination: Goal: Will not experience complications related to bowel motility Outcome: Adequate for Discharge Goal: Will not experience complications related to urinary retention Outcome: Adequate for Discharge   Problem: Pain Managment: Goal: General experience of comfort will improve and/or be controlled Outcome: Adequate for Discharge   Problem: Safety: Goal: Ability to remain free from injury will improve Outcome: Adequate for Discharge   Problem: Skin Integrity: Goal: Risk for impaired skin integrity will decrease Outcome: Adequate for Discharge   Problem: Education: Goal: Knowledge of disease or condition will improve Outcome: Adequate for Discharge Goal: Knowledge of the prescribed therapeutic regimen will improve Outcome: Adequate for Discharge Goal: Individualized Educational Video(s) Outcome: Adequate for Discharge   Problem: Activity: Goal: Ability to tolerate increased activity will improve Outcome: Adequate for Discharge Goal: Will verbalize the importance of balancing activity with adequate rest periods Outcome: Adequate for Discharge   Problem: Respiratory: Goal: Ability to maintain a clear airway will improve Outcome: Adequate for Discharge Goal: Levels of oxygenation will improve Outcome: Adequate for Discharge Goal: Ability to maintain adequate ventilation will improve Outcome: Adequate for Discharge

## 2023-12-28 NOTE — Care Management Important Message (Signed)
 Important Message  Patient Details  Name: Jorge Cruz MRN: [id number] Date of Birth: [birth year]   Important Message Given:  Yes - Medicare IM     Anise Kerns 12/28/2023, 1:37 PM

## 2023-12-28 NOTE — Discharge Summary (Signed)
 [facility] Physician Discharge Summary   Patient name: Jorge Cruz  Admit date:     12/25/2023  Discharge date: 12/28/2023  Attending Physician: Avi Body [[id number]]  Discharge Physician: Unk Garb   [facility]: [facility]  Admitted From: Home  Disposition:  Home  Recommendations for Outpatient Follow-up:  Follow up with PCP in 1-2 weeks Ask your PCP about Chantix to stop smoking  Home Health:No Equipment/Devices: Home nebulizer machine  Discharge Condition:Stable CODE STATUS:FULL Diet recommendation: Heart Healthy Fluid Restriction: None  Hospital Summary: Jorge Cruz is a 73 y.o. 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  He also has a nonproductive cough. Upon arriving to hospital, he had significant respiratory distress, was placed on BiPAP with 40% oxygen. ABG showed significant CO2 retention, creatinine 1.36, BNP 876, troponin peak at 284. Patient is placed on IV Lasix  and steroids. He is off BiPAP in the morning and 5/6.  HPI: Jorge Cruz is a 73 y.o. 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   Jorge Cruz states he was here in the ED earlier today due to shortness of breath and nonproductive cough that he had been experiencing for approximately 5 days.  During this time, he is also noted fluctuations in his weight and abdominal distention.  His wife at bedside feels that his abdomen appears bigger than normal.  He denies any fever, congestion, nausea, vomiting,  diarrhea.  He denies any lower extremity swelling.  He was started on breathing treatments and discharged home with doxycycline .   Jorge Cruz states that after arriving home, his shortness of breath rapidly progressed and became severe.  He began to experience chest pressure when he was in respiratory distress but notes that the pressure improved when his shortness of breath improved.  He denies any new symptoms after arriving home.   ED course: On arrival to the ED, patient was hypertensive at 158/89 with heart rate of 107.  He was saturating at 99% on BiPAP with 40% FiO2.  He was afebrile at 97.6.  Chest x-ray obtained and pending.  No repeat blood work obtained, however ordered.  Patient given DuoNeb and TRH contacted for admission.  Significant Events: Admitted 12/25/2023 for acute hypoxic respiratory failure   Significant Labs: WBC 8.5, HgB 13.7, plt 160 Na 136, K 4.3, CO2 of 28, BUN 20, Scr 1.23, glu 236 BNP 876 Mg 2.2 VBG pH 7.26, PCO2 of 75  Significant Imaging Studies: CXR 1. Enlarged heart. 2. No overt edema  Antibiotic Therapy: Anti-infectives (From admission, onward)    None       Procedures:   Consultants:    Hospital Course by Problem: * Acute hypoxic respiratory failure (HCC) On admission. Patient is presenting with profound shortness of breath and found to be hypoxic as low as 70%, with improvement on BiPAP.  Compared to chest x-ray obtained earlier today, it appears he has now developed pulmonary edema.  Suspect COPD exacerbation is also contributing. - Continue BiPAP - Wean as tolerated - VBG pending  12-27-2023 off bipap. On 3 L/min O2. Sats 100%.  No peripheral edema now. Can stop IV lasix . I think most of his SOB now is due to COPD exacerbation. On po prednisone . Needs home nebulizer machine. Watch overnight. Wean to RA. Possible home in AM.  12-28-2023 weaned to RA. Stable. DC to home   COPD with acute exacerbation (HCC) On admission. History of  COPD, on as needed albuterol  only.  Given diffuse wheezing, suspect COPD is also contributing to current picture. - S/p Solu-Medrol  125 mg once - Start prednisone  40 mg daily tomorrow - DuoNebs every 6 hours - Hold off on antibiotics given low suspicion for bacterial etiology  12-27-2023 off bipap. On 3 L/min O2. Sats 100%. No peripheral edema now. Can stop IV lasix . I think most of his SOB now is due to COPD exacerbation. On po prednisone . Needs home nebulizer machine. Watch overnight. Wean to RA. Possible home in AM.  12-28-2023 weaned to RA. Stable. DC to home with home nebs machine, duonebs, 7 days of prednisone . No need for abx.  Acute on chronic heart failure with reduced ejection fraction (HFrEF, EF 25-30% 01/2023) (HCC) On admission. History of HFrEF in the setting of nonischemic cardiomyopathy with last EF of 25-30% in June 2024.  He follows with the advanced heart failure team. Patient presented earlier today with 5 days of shortness of breath and nonproductive cough, in the setting of fluctuating weight and abdominal distention.  Subsequently developed pulmonary edema this afternoon requiring return to the ED and BiPAP. - Telemetry monitoring - Consider advanced heart failure team consultation in the a.m. - Start Lasix  40 mg IV twice daily - BNP pending - Strict ins and out - Daily weights - Daily BMP and magnesium - Continue home GDMT  12-27-2023 off bipap. On 3 L/min O2. Sats 100%. No peripheral edema now. Can stop IV lasix . I think most of his SOB now is due to COPD exacerbation. On po prednisone . Needs home nebulizer machine. Watch overnight. Wean to RA. Possible home in AM.  12-28-2023 stable. Now. Continue home GDMT.  Class 1 obesity Body mass index is 33.65 kg/m.   Demand ischemia (HCC) On admission. Mildly elevated troponin earlier today in the setting of shortness of breath.  Repeat troponin will be even higher given severe respiratory distress requiring BiPAP.   History of nonobstructive CAD.  Chest pain has resolved with improvement in breathing. - Troponin pending - Hold off on heparin  unless markedly elevated troponin  12-27-2023 stable. No chest pain.  12-28-2023 Stable. DC to home   Paroxysmal atrial fibrillation (HCC) On admission. Currently in sinus rhythm on EKG. - Continue home regimen  12-27-2023 stable.  12-28-2023 Stable. DC to home   CKD stage 3a, GFR 45-59 ml/min (HCC) - baseline scr 1.3 12-27-2023 stable. baseline scr 1.3  12-28-2023 Stable. DC to home   Tobacco use disorder 12-27-2023 pt advised to stop smoking.  12-28-2023 pt to ask PCP about Chantix.  Essential hypertension On admission. On EMS arrival, patient was notably hypertensive above 200 systolics.  Nitropaste was placed in blood pressure has improved close to baseline at this time.  Unclear if hypertensive emergency was contributing to rapid pulmonary edema earlier today. - Hold off on nitroglycerin  infusion at this time given improvement in blood pressure - Continue home regimen  12-27-2023 BP improved with treatment of his COPD exacerbation.  12-28-2023 Stable. DC to home   Diabetes [facility]) On admission. History of well-controlled type 2 diabetes with last A1c of 7.2% approximately 4 months ago. - Hold home metformin  -  SSI, moderate  12-27-2023 stable. Continue SSI  12-28-2023 Stable. DC to home     Discharge Diagnoses:  Principal Problem:   Acute hypoxic respiratory failure (HCC) Active Problems:   COPD with acute exacerbation (HCC)   Acute on chronic heart failure with reduced ejection fraction (HFrEF, EF 25-30% 01/2023) (HCC)   Diabetes (HCC)   Essential hypertension   Tobacco use disorder   CKD stage 3a, GFR 45-59 ml/min (HCC) - baseline scr 1.3   Paroxysmal atrial fibrillation (HCC)   Demand ischemia (HCC)   Class 1 obesity   Discharge Instructions  Discharge Instructions     Call MD for:  difficulty breathing, headache or visual  disturbances   Complete by: As directed    Call MD for:  extreme fatigue   Complete by: As directed    Call MD for:  persistant dizziness or light-headedness   Complete by: As directed    Call MD for:  persistant nausea and vomiting   Complete by: As directed    Call MD for:  severe uncontrolled pain   Complete by: As directed    Call MD for:  temperature >100.4   Complete by: As directed    Diet - low sodium heart healthy   Complete by: As directed    Discharge instructions   Complete by: As directed    1. Follow up with your primary care provider in 1-2 weeks following discharge from hospital. 2. Ask your doctor if the medication called Chantix would be beneficial in helping you stop smoking.   Increase activity slowly   Complete by: As directed       Allergies as of 12/28/2023       Reactions   Spironolactone  Other (See Comments)   Hyperkalemia        Medication List     STOP taking these medications    predniSONE  10 MG (21) Tbpk tablet Commonly known as: STERAPRED UNI-PAK 21 TAB Replaced by: predniSONE  20 MG tablet       TAKE these medications    albuterol  108 (90 Base) MCG/ACT inhaler Commonly known as: VENTOLIN  HFA Inhale 2 puffs into the lungs every 6 (six) hours as needed for wheezing or shortness of breath.   allopurinol  100 MG tablet Commonly known as: ZYLOPRIM  Take 1 tablet by mouth daily.   apixaban  5 MG Tabs tablet Commonly known as: ELIQUIS  Take 1 tablet (5 mg total) by mouth 2 (two) times daily.   atorvastatin  80 MG tablet Commonly known as: LIPITOR  Take 1 tablet (80 mg total) by mouth daily.   benzonatate  200 MG capsule Commonly known as: TESSALON  Take 1 capsule (200 mg total) by mouth 3 (three) times daily as needed for cough.   carvedilol  12.5 MG tablet Commonly known as: COREG  Take 1 tablet (12.5 mg total) by mouth 2 (two) times daily.   clotrimazole  1 % cream Commonly known as: Clotrimazole  Anti-Fungal Apply 1 Application  topically 2 (two) times daily. Apply to the foreskin and head of the penis   doxycycline  100 MG capsule Commonly known as: VIBRAMYCIN  Take 1 capsule (100 mg total) by mouth 2 (two) times daily.   Farxiga  10 MG Tabs tablet Generic drug: dapagliflozin  propanediol TAKE 1 TABLET (10 MG TOTAL) BY MOUTH DAILY BEFORE BREAKFAST. NEEDS FOLLOW UP APPOINTMENT FOR MORE REFILLS   fluticasone  50 MCG/ACT nasal spray Commonly known as: FLONASE  Place 2 sprays into both nostrils daily.   furosemide  40 MG tablet Commonly known as: Lasix  Take 1 tablet (40  mg total) by mouth daily.   guaiFENesin -dextromethorphan  100-10 MG/5ML syrup Commonly known as: ROBITUSSIN DM Take 5 mLs by mouth every 4 (four) hours as needed for cough.   hydrocortisone cream 1 % Apply 1 Application topically 2 (two) times daily.   ipratropium-albuterol  0.5-2.5 (3) MG/3ML Soln Commonly known as: DUONEB Take 3 mLs by nebulization 3 (three) times daily for 7 days, THEN 3 mLs every 6 (six) hours as needed for up to 21 days (SOB, wheezing). Start taking on: Dec 28, 2023   metFORMIN  1000 MG tablet Commonly known as: GLUCOPHAGE  Take 1,000 mg by mouth 2 (two) times daily.   nitroGLYCERIN  0.4 MG SL tablet Commonly known as: NITROSTAT  DISSOLVE 1 TABLET UNDER THE  TONGUE EVERY 5 MINUTES AS NEEDED FOR CHEST PAIN. MAX OF 3 TABLETS IN 15 MINUTES. CALL 911 IF PAIN  PERSISTS.   predniSONE  20 MG tablet Commonly known as: DELTASONE  Take 2 tablets (40 mg total) by mouth daily with breakfast for 7 days. Start taking on: Dec 29, 2023 Replaces: predniSONE  10 MG (21) Tbpk tablet   sacubitril -valsartan  97-103 MG Commonly known as: ENTRESTO  Take 1 tablet by mouth 2 (two) times daily.               Durable Medical Equipment  (From admission, onward)           Start     Ordered   12/27/23 1119  For home use only DME Nebulizer machine  Once       Question Answer Comment  Patient needs a nebulizer to treat with the following  condition COPD exacerbation (HCC)   Length of Need Lifetime   Additional equipment included Administration kit   Additional equipment included Filter      12/27/23 1118            Follow-up Information     [facility]. Go on 01/03/2024.   Specialty: Cardiology Why: Hospital Follow-Up 01/03/24 @ 10:30AM Please bring all medications to follow-up appointment [facility] Free Valet Parking at the door Contact information: [address] [place]  [phone]               Allergies  Allergen Reactions   Spironolactone  Other (See Comments)    Hyperkalemia    Discharge Exam: Vitals:   12/28/23 0819 12/28/23 1223  BP:  130/61  Pulse:  71  Resp:  19  Temp:  99 F (37.2 C)  SpO2: 98% 94%    Physical Exam Vitals and nursing note reviewed.  Constitutional:      General: He is not in acute distress.    Appearance: He is obese. He is not toxic-appearing or diaphoretic.  HENT:     Head: Normocephalic and atraumatic.  Eyes:     General: No scleral icterus. Cardiovascular:     Rate and Rhythm: Normal rate and regular rhythm.  Pulmonary:     Effort: Pulmonary effort is normal. No respiratory distress.     Breath sounds: Normal breath sounds. No wheezing or rales.  Abdominal:     General: Bowel sounds are normal. There is no distension.     Palpations: Abdomen is soft.  Musculoskeletal:     Right lower leg: No edema.     Left lower leg: No edema.  Skin:    General: Skin is warm and dry.     Capillary Refill: Capillary refill takes less than 2 seconds.  Neurological:     Mental Status: He is alert and oriented to person, place, and time.     The results of significant diagnostics from this hospitalization (including imaging, microbiology, ancillary and laboratory) are listed below for reference.    Microbiology: Recent Results (from the past  240 hours)  Resp panel by RT-PCR (RSV, Flu A&B, Covid) Anterior Nasal Swab     Status: None   Collection Time: 12/25/23  9:36 AM   Specimen: Anterior Nasal Swab  Result Value Ref Range Status   SARS Coronavirus 2 by RT PCR NEGATIVE NEGATIVE Final    Comment: (NOTE) SARS-CoV-2 target nucleic acids are NOT DETECTED.  The SARS-CoV-2 RNA is generally detectable in upper respiratory specimens during the acute phase of infection. The lowest concentration of SARS-CoV-2 viral copies this assay can detect is 138 copies/mL. A negative result does not preclude SARS-Cov-2 infection and should not be used as the sole basis for treatment or other patient management decisions. A negative result may occur with  improper specimen collection/handling, submission of specimen other than nasopharyngeal swab, presence of viral mutation(s) within the areas targeted by this assay, and inadequate number of viral copies(<138 copies/mL). A negative result must be combined with clinical observations, patient history, and epidemiological information. The expected result is Negative.  Fact Sheet for Patients:  BloggerCourse.com  Fact Sheet for Healthcare Providers:  SeriousBroker.it  This test is no t yet approved or cleared by the [place]  FDA and  has been authorized for detection and/or diagnosis of SARS-CoV-2 by FDA under an Emergency Use Authorization (EUA). This EUA will remain  in effect (meaning this test can be used) for the duration of the COVID-19 declaration under Section 564(b)(1) of the Act, 21 U.S.C.section 360bbb-3(b)(1), unless the authorization is terminated  or revoked sooner.       Influenza A by PCR NEGATIVE NEGATIVE Final   Influenza B by PCR NEGATIVE NEGATIVE Final    Comment: (NOTE) The Xpert Xpress SARS-CoV-2/FLU/RSV plus assay is intended as an aid in the diagnosis of influenza from Nasopharyngeal swab specimens and should  not be used as a sole basis for treatment. Nasal washings and aspirates are unacceptable for Xpert Xpress SARS-CoV-2/FLU/RSV testing.  Fact Sheet for Patients: BloggerCourse.com  Fact Sheet for Healthcare Providers: SeriousBroker.it  This test is not yet approved or cleared by the [place]  FDA and has been authorized for detection and/or diagnosis of SARS-CoV-2 by FDA under an Emergency Use Authorization (EUA). This EUA will remain in effect (meaning this test can be used) for the duration of the COVID-19 declaration under Section 564(b)(1) of the Act, 21 U.S.C. section 360bbb-3(b)(1), unless the authorization is terminated or revoked.     Resp Syncytial Virus by PCR NEGATIVE NEGATIVE Final    Comment: (NOTE) Fact Sheet for Patients: BloggerCourse.com  Fact Sheet for Healthcare Providers: SeriousBroker.it  This test is not yet approved or cleared by the [place]  FDA and has been authorized for detection and/or diagnosis of SARS-CoV-2 by FDA under an Emergency Use Authorization (EUA). This EUA will remain in effect (meaning this test can be used) for the duration of the COVID-19 declaration under Section 564(b)(1) of the Act, 21 U.S.C. section 360bbb-3(b)(1), unless the authorization is terminated or revoked.  Performed at [facility], [address] [place], [place] [address]      Labs: BNP (last 3 results) Recent Labs    08/09/23 1609 12/12/23 0946 12/25/23 2334  BNP 884.9* 733.5* 876.4*   Basic Metabolic  Panel: Recent Labs  Lab 12/25/23 1014 12/25/23 2334 12/25/23 2335 12/27/23 0252 12/28/23 0409  NA 140 136  --  136 139  K 3.8 4.3  --  4.4 4.5  CL 105 101  --  100 100  CO2 27 28  --  27 31  GLUCOSE 162* 236*  --  245* 176*  BUN 17 20  --  44* 46*  CREATININE 0.98 1.23  --  1.26* 1.08  CALCIUM  8.7* 8.5*  --  8.5* 8.6*  MG  --   --   2.2 1.8 2.2   Liver Function Tests: Recent Labs  Lab 12/25/23 1014  AST 20  ALT 14  ALKPHOS 70  BILITOT 0.7  PROT 6.8  ALBUMIN 3.7   CBC: Recent Labs  Lab 12/25/23 1014 12/25/23 2334  WBC 4.7 8.5  NEUTROABS 2.3 6.9  HGB 14.2 13.7  HCT 43.4 42.8  MCV 98.6 100.2*  PLT 178 160   BNP: Recent Labs  Lab 12/25/23 2334  BNP 876.4*   CBG: Recent Labs  Lab 12/27/23 1207 12/27/23 1701 12/27/23 2119 12/28/23 0808 12/28/23 1225  GLUCAP 145* 218* 195* 167* 172*   Sepsis Labs Recent Labs  Lab 12/25/23 1014 12/25/23 2334  WBC 4.7 8.5    Procedures/Studies: DG Chest Portable 1 View Result Date: 12/25/2023 CLINICAL DATA:  Shortness of breath EXAM: PORTABLE CHEST 1 VIEW COMPARISON:  12/25/2023 FINDINGS: Left AICD remains in place, unchanged. Cardiomegaly with vascular congestion and interstitial prominence, likely interstitial edema. No effusions or acute bony abnormality. IMPRESSION: Cardiomegaly with vascular congestion and mild interstitial edema. Electronically Signed   By: Janeece Mechanic M.D.   On: 12/25/2023 22:56   DG Chest 2 View Result Date: 12/25/2023 CLINICAL DATA:  Cough EXAM: CHEST - 2 VIEW COMPARISON:  Chest x-ray performed December 14, 2023 FINDINGS: Heart is enlarged. An implant is present overlying the left chest with 2 leads terminating in the heart. No effusion or pneumothorax. IMPRESSION: 1. Enlarged heart. 2. No overt edema. Electronically Signed   By: Reagan Camera M.D.   On: 12/25/2023 10:28   DG Chest 2 View Result Date: 12/14/2023 CLINICAL DATA:  Chest pain EXAM: CHEST - 2 VIEW COMPARISON:  August 09, 2023 FINDINGS: The heart size and mediastinal contours are within normal limits. Both lungs are clear. The visualized skeletal structures are unremarkable. No change in the left subclavian bipolar ICDF pacemaker device tip of the leads in good position no evidence of discontinuity IMPRESSION: No active cardiopulmonary disease. Electronically Signed   By: Fredrich Jefferson M.D.   On: 12/14/2023 10:46    Time coordinating discharge: 50 mins  SIGNED:  Unk Garb, DO [facility] 12/28/23, 12:40 PM

## 2023-12-28 NOTE — Progress Notes (Signed)
 Heart Failure Stewardship Pharmacy Note  PCP: [facility] PCP-Cardiologist: Sammy Crisp, MD  HPI: Jorge Cruz is a 73 y.o. male with CAD with history of PCI, history of cocaine use, HFrEF (EF 25 to 30% 01/2023) ischemic cardiomyopathy s/p AICD, paroxysmal A-fib on Eliquis  DM, COPD, nicotine  dependence, HTN who presented with 2 week history of non-productive cough, progressing a week later to dyspnea on exertion and a weight gain of 10 lbs. Treated COPD with steroids and antibiotics outpatient. High salt intake with fast food. BNP on admission is 637.6. HS-troponin on admission is 92. No pulmonary edema noted on CXR.    Pertinent cardiac history: Stress test in 03/2016 with LVEF of <30%, severe LV dilation, and no perfusion defects. Echo at the same time with LVEF of 20-25%, grade I diastolic dysfunction, mild MR. LHC/RHC in 05/2017 showed mild nonobstructive CAD, cardiac index of 1.83 and PCWP of 5. LVEF unchanged in 11/2017. Echo in 09/2017 with LVEF improved to 30-35% and RV low-normal. ICD implanted in 2021. Similar in 07/2021. Echo in 01/2023 showed LVEF down to 25-30% with low-normal RV function.  Pertinent Lab Values: Creatinine, Ser  Date Value Ref Range Status  12/28/2023 1.08 0.61 - 1.24 mg/dL Final   BUN  Date Value Ref Range Status  12/28/2023 46 (H) 8 - 23 mg/dL Final  40/98/1191 16 8 - 27 mg/dL Final   Potassium  Date Value Ref Range Status  12/28/2023 4.5 3.5 - 5.1 mmol/L Final   Sodium  Date Value Ref Range Status  12/28/2023 139 135 - 145 mmol/L Final  12/12/2023 142 134 - 144 mmol/L Final   B Natriuretic Peptide  Date Value Ref Range Status  12/25/2023 876.4 (H) 0.0 - 100.0 pg/mL Final    Comment:    Performed at [facility], [address] [place], [place] [address]   Magnesium  Date Value Ref Range Status  12/28/2023 2.2 1.7 - 2.4 mg/dL Final    Comment:    Performed at [facility], [address]  [place], [place] [address]   Hgb A1c MFr Bld  Date Value Ref Range Status  08/09/2023 7.2 (H) 4.8 - 5.6 % Final    Comment:    (NOTE) Pre diabetes:          5.7%-6.4%  Diabetes:              >6.4%  Glycemic control for   <7.0% adults with diabetes    TSH  Date Value Ref Range Status  11/24/2017 0.940 0.350 - 4.500 uIU/mL Final    Comment:    Performed by a 3rd Generation assay with a functional sensitivity of <=0.01 uIU/mL. Performed at [facility], [address] [place], [place] [address]     Vital Signs:  Temp:  [97.6 F (36.4 C)-98.1 F (36.7 C)] 97.7 F (36.5 C) (05/08 0805) Pulse Rate:  [63-69] 63 (05/08 0805) Cardiac Rhythm: Atrial paced (05/08 0700) Resp:  [18-20] 19 (05/08 0805) BP: (113-124)/(43-68) 113/43 (05/08 0805) SpO2:  [98 %-100 %] 98 % (05/08 0819) Weight:  [100 kg (220 lb 6.4 oz)] 100 kg (220 lb 6.4 oz) (05/08 0510)  Intake/Output Summary (Last 24 hours) at 12/28/2023 0920 Last data filed at 12/28/2023 0413 Gross per 24 hour  Intake 480 ml  Output 950 ml  Net -470 ml    Current Heart Failure Medications:  Loop diuretic: furosemide  40 mg PO daily Beta-Blocker: carvedilol  12.5 mg BID ACEI/ARB/ARNI: Entresto  97/103 mg BID MRA:  none SGLT2i: none Other: none   Prior to admission Heart Failure Medications:  Loop diuretic: furosemide  20 mg daily Beta-Blocker: carvedilol  6.25 mg BID ACEI/ARB/ARNI: Entresto  97/103 mg BID MRA: none SGLT2i: Farxiga  10 mg daily Other: none  Assessment: 1. Acute on chronic systolic heart failure (LVEF 01/2023 25-30%) with low-normal RV function, due to NICM. NYHA class II-III symptoms.  -Symptoms: Reports feeling back to baseline. -Volume: Appears to still be euvolemic. JVP still visibile but lower. Weight today is down 6 lbs from admission. Dry weight is ~218 lbs. Creatinine is improved today. Transitioned to Po furosemide . -Hemodynamics: BP is stable. HR stable in 60s. -BB: Continue carvedilol  12.5 mg BID.  Increased this admission. -ACEI/ARB/ARNI: Continue Entresto  97-103 mg BID -MRA: Can consider starting Kerendia outpatient given history of hyperkalemia on spiro, prior auth approved. -SGLT2i: Consider resuming Farxiga  today.  Plan: 1) Medication changes recommended at this time: -Can consider adding back Farxiga  10 mg daily  2) Patient assistance: -Prior authorization for Kerendia approved  3) Education: - Patient has been educated on current HF medications and potential additions to HF medication regimen - Patient verbalizes understanding that over the next few months, these medication doses may change and more medications may be added to optimize HF regimen - Patient has been educated on basic disease state pathophysiology and goals of therapy  Medication Assistance / Insurance Benefits Check: Does the patient have prescription insurance?    Type of insurance plan:  Does the patient qualify for medication assistance through manufacturers or grants? Pending  Prior authorization started for Kerendia to check cost  Outpatient Pharmacy: Prior to admission outpatient pharmacy: [facility]      Please do not hesitate to reach out with questions or concerns,  Bevely Brush, PharmD, CPP, BCPS Heart Failure Pharmacist  Phone - [phone] 12/28/2023 9:20 AM

## 2024-01-02 NOTE — Progress Notes (Unsigned)
 Advanced Heart Failure Clinic Note    PCP: [facility] (last seen 11/23) Primary Cardiologist: Sammy Crisp, MD / Ronald Cockayne, NP (last seen 04/25)  Chief Complaint: shortness of breath   HPI:  Mr Jorge Cruz is a 73 y/o male with a history of DM, COPD, HTN, nonobstructive CAD, PAF, CKD, tobacco use and chronic heart failure. Had ICD implanted 05/21 monitored through [facility]. Marvell Slider off a building in 1987 head first. Has difficulty reading.   Echo 10/11/19: EF of 30-35% along with trivial MR. Echo 07/06/20: EF of 25%. Echo 08/20/21: EF of 30-35%.     Was in the ED 07/23/22 due to an acute cough. Thought to be due to bronchitis.    Echo 02/08/23: EF 25-30% with mild LVH   Admitted 08/09/23 due to shortness of breath and cough for > 1 week. Noted 10 pound weight gain. Admits to dietary indiscretion and inconsistent medication compliance. Initially needed oxygen but able to be weaned off. IV diuresed. Cardiology consulted. Patient reports significant improvement and he was released.   Admitted 12/25/23 with shortness of breath, weight gain, abdominal distention and nonproductive cough. He had significant respiratory distress, was placed on BiPAP with 40% oxygen. ABG showed significant CO2 retention, creatinine 1.36, BNP 876, troponin peak at 284. Patient is placed on IV Lasix  and steroids. Weaned off bipap => nasal cannula => to room air.   He presents today for a hospital HF follow-up visit with a chief complaint of shortness of breath. Has associated fatigue, productive cough, occasional chest pain over device, palpitations, off-balance and intermittent trouble sleeping. Denies abdominal distention, pedal edema or dizziness. Did take his medications this morning ~ 3 hours ago. He voices concern about all the medications that he takes. Admits that he's feeling emotional because one of his best life-long friends is not doing well physically.   Smoking 1/2 ppd of cigarettes and  drinking 1-2 mixed drinks 4- 5 times / week. Says that his goal for 2025 is to quit both tobacco and alcohol.   Used cocaine for ~ 30 years but quit that years ago.   Previous cardiac studies:  Remote treadmill MPI in 03/2016 showed no evidence of perfusion defects with an EF of less than 30% and was overall an intermediate risk study.   Echo 04/01/16: EF of 20-25% along with mild MR.  Echo 11/25/17: EF of 20-25% along with a mildly elevated PA pressure of 35 mm Hg.   Cardiac catheterization 06/01/17: EF of 20% along with mild nonobstructive CAD. RHC also done which showed normal filling pressures, normal pulmonary pressure and moderately reduced cardiac output. Cardiac output was 3.82 with a cardiac index of 1.83.  ROS: All systems negative except as listed in HPI, PMH and Problem List.  SH:  Social History   Socioeconomic History   Marital status: Married    Spouse name: Not on file   Number of children: Not on file   Years of education: Not on file   Highest education level: Not on file  Occupational History   Not on file  Tobacco Use   Smoking status: Every Day    Current packs/day: 0.50    Average packs/day: 0.5 packs/day for 52.0 years (26.0 ttl pk-yrs)    Types: Cigarettes   Smokeless tobacco: Never  Vaping Use   Vaping status: Never Used  Substance and Sexual Activity   Alcohol use: Yes    Alcohol/week: 4.0 - 5.0 standard drinks of alcohol    Types: 1 -  2 Cans of beer, 3 Shots of liquor per week    Comment: occassional drink; past-1/2 to 1 pint liquor a week   Drug use: Not Currently    Types: Cocaine    Comment: 3 years   Sexual activity: Not Currently  Other Topics Concern   Not on file  Social History Narrative   Not on file   Social Drivers of Health   Financial Resource Strain: Low Risk  (08/10/2023)   Overall Financial Resource Strain (CARDIA)    Difficulty of Paying Living Expenses: Not very hard  Food Insecurity: No Food Insecurity (12/27/2023)   Hunger  Vital Sign    Worried About Running Out of Food in the Last Year: Never true    Ran Out of Food in the Last Year: Never true  Transportation Needs: No Transportation Needs (12/27/2023)   PRAPARE - Administrator, Civil Service (Medical): No    Lack of Transportation (Non-Medical): No  Physical Activity: Not on file  Stress: Not on file  Social Connections: Moderately Isolated (12/27/2023)   Social Connection and Isolation Panel [NHANES]    Frequency of Communication with Friends and Family: More than three times a week    Frequency of Social Gatherings with Friends and Family: More than three times a week    Attends Religious Services: Never    Database administrator or Organizations: No    Attends Banker Meetings: Never    Marital Status: Married  Catering manager Violence: Not At Risk (12/27/2023)   Humiliation, Afraid, Rape, and Kick questionnaire    Fear of Current or Ex-Partner: No    Emotionally Abused: No    Physically Abused: No    Sexually Abused: No    FH:  Family History  Problem Relation Age of Onset   Diabetes Mother    Diabetes Father     Past Medical History:  Diagnosis Date   CHF (congestive heart failure) (HCC)    Chronic combined systolic (congestive) and diastolic (congestive) heart failure (HCC)    a. 03/2014 Echo: EF 45%, glob HK; b. 03/2016 Echo: EF 20-25%, diff HK, Gr1 DD, mild MR; b. 11/2017 Echo: EF 20%, diff HK, Gr2 DD, mildly to mod reduced RV fxn, PASP .   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 03/2014 Echo: EF 45%, glob HK, mild conc LVH; b. 03/2014 MV: possible mild ischemia, superimposed on small inf infarct, EF 36%; c.  03/2016 MV: EF <30%, no ischemia; d. 03/2016 Echo: EF 20-25%, diff HK, Gr1 DD, mild MR; e. 05/2017 Cath: nonobs dzs, EF 20%; f. 11/2017 Echo: EF 20%, diff HK, Gr2 DD.   Non-obstructive CAD (coronary artery disease)    a. 05/2017 Cath: LM nl, LAD 54m, LCX 65m, RCA 65m, EF 20%.   NSTEMI (non-ST elevated myocardial infarction) (HCC) 05/31/2017   Paroxysmal atrial fibrillation (HCC)    Polysubstance abuse (HCC)    Tobacco abuse     Current Outpatient Medications  Medication Sig Dispense Refill   albuterol  (PROVENTIL  HFA;VENTOLIN  HFA) 108 (90 Base) MCG/ACT inhaler Inhale 2 puffs into the lungs every 6 (six) hours as needed for wheezing or shortness of breath.  allopurinol  (ZYLOPRIM ) 100 MG tablet Take 1 tablet by mouth daily.     apixaban  (ELIQUIS ) 5 MG TABS tablet Take 1 tablet (5 mg total) by mouth 2 (two) times daily. 180 tablet 1   atorvastatin  (LIPITOR ) 80 MG tablet Take 1 tablet (80 mg total) by mouth daily. 90 tablet 1   benzonatate  (TESSALON ) 200 MG capsule Take 1 capsule (200 mg total) by mouth 3 (three) times daily as needed for cough. 21 capsule 0   carvedilol  (COREG ) 12.5 MG tablet Take 1 tablet (12.5 mg total) by mouth 2 (two) times daily.     clotrimazole  (CLOTRIMAZOLE  ANTI-FUNGAL) 1 % cream Apply 1 Application topically 2 (two) times daily. Apply to the foreskin and head of the penis 30 g 0   doxycycline  (VIBRAMYCIN ) 100 MG capsule Take 1 capsule (100 mg total) by mouth 2 (two) times daily. (Patient not taking: Reported on 12/26/2023) 20 capsule 0   FARXIGA  10 MG TABS tablet TAKE 1 TABLET (10 MG TOTAL) BY MOUTH DAILY BEFORE BREAKFAST. NEEDS FOLLOW UP APPOINTMENT FOR MORE REFILLS 30 tablet 5   fluticasone  (FLONASE ) 50 MCG/ACT nasal spray Place 2 sprays into both nostrils daily.     furosemide  (LASIX ) 40 MG tablet Take 1 tablet (40 mg total) by mouth daily. 90 tablet 3   guaiFENesin -dextromethorphan  (ROBITUSSIN DM) 100-10 MG/5ML syrup Take 5 mLs by mouth every 4 (four) hours as needed for  cough. 118 mL 0   hydrocortisone cream 1 % Apply 1 Application topically 2 (two) times daily.     ipratropium-albuterol  (DUONEB) 0.5-2.5 (3) MG/3ML SOLN Take 3 mLs by nebulization 3 (three) times daily for 7 days, THEN 3 mLs every 6 (six) hours as needed for up to 21 days (SOB, wheezing). 720 mL 0   metFORMIN  (GLUCOPHAGE ) 1000 MG tablet Take 1,000 mg by mouth 2 (two) times daily.     nitroGLYCERIN  (NITROSTAT ) 0.4 MG SL tablet DISSOLVE 1 TABLET UNDER THE  TONGUE EVERY 5 MINUTES AS NEEDED FOR CHEST PAIN. MAX OF 3 TABLETS IN 15 MINUTES. CALL 911 IF PAIN  PERSISTS. 25 tablet 0   predniSONE  (DELTASONE ) 20 MG tablet Take 2 tablets (40 mg total) by mouth daily with breakfast for 7 days. 14 tablet 0   sacubitril -valsartan  (ENTRESTO ) 97-103 MG Take 1 tablet by mouth 2 (two) times daily. 180 tablet 3   No current facility-administered medications for this visit.   Vitals:   01/03/24 1030  BP: (!) 90/56  Pulse: 61  SpO2: 97%  Weight: 220 lb 12.8 oz (100.2 kg)   Wt Readings from Last 3 Encounters:  01/03/24 220 lb 12.8 oz (100.2 kg)  12/28/23 220 lb 6.4 oz (100 kg)  12/25/23 216 lb (98 kg)   Lab Results  Component Value Date   CREATININE 1.08 12/28/2023   CREATININE 1.26 (H) 12/27/2023   CREATININE 1.23 12/25/2023    PHYSICAL EXAM:  General: Well appearing. No resp difficulty HEENT: normal Neck: supple, no JVD Cor: Regular rhythm, rate. No rubs, gallops or murmurs Lungs: clear Abdomen: soft, nontender, nondistended. Extremities: no cyanosis, clubbing, rash, edema Neuro: alert & oriented X 3. Moves all 4 extremities w/o difficulty. Affect pleasant   ECG: NSR with occasional PVC (personally reviewed)   ASSESSMENT & PLAN:  1: NICM with reduced ejection fraction- - likely due to HTN as cath in 2018 was nonobstructive - NYHA class II - euvolemic today - weight up 2 pounds since he was last here 2.5 months ago - Echo 07/06/20: EF of 25%.  -  Echo 08/20/21: EF of 30-35%.  - Echo  02/08/23: EF 25-30% with mild LVH  - need to get echo updated ? cMRI; concern that now having to dial back GDMT due to BP - continue carvedilol  6.25mg  BID - continue farxiga  10mg  daily - continue furosemide  40mg  daily - decrease entresto  to 49/51mg  BID - previously had been on spironolactone  but he developed hyperkalemia, K+ 6.1 in 01/23 - consider kerendia 10mg  as this has been approved by his insurance as $0 copay; will let BP rise before starting - had ICD implantation 01/02/20; denies any firing of ICD - using Mrs Lindalee Retort for seasoning  - BNP 12/25/23 was 876.4  2: HTN- - BP 90/56 - decrease entresto  to 49/51mg  BID; showed patient which med in pill pack to cut in 1/2  - [facility] PCP at [facility]  - BMP 12/28/23 reviewed: sodium 139, potassium 4.5, creatinine 1.08 and GFR >60 - BMET today  3: DM- - A1c on 08/09/23 was 7.2%  4:Tobacco use-  - smoking 1/2-1 ppd of cigarettes & plans to quit this year - has 1-2 mixed drinks 4-5 times/ week and plans to quit this year - complete cessation discussed for 3 minutes with the patient  5: Non-obstructive CAD- - saw cardiology ([place]) 04/25 - continue atorvastatin  80mg  daily - LDL 09/02/21 was 33 - Cardiac catheterization 06/01/17: EF of 20% along with mild nonobstructive CAD. RHC also done which showed normal filling pressures,  normal pulmonary pressure and moderately reduced cardiac output. Cardiac output was 3.82 with a cardiac index of 1.83.  6: PAF- - continue apixaban  5mg  BID - HR regular today   Return in 1 month, sooner if needed.   Charlette Console, FNP 01/02/24

## 2024-01-03 ENCOUNTER — Ambulatory Visit: Attending: Family | Admitting: Family

## 2024-01-03 ENCOUNTER — Encounter: Payer: Self-pay | Admitting: Family

## 2024-01-03 VITALS — BP 90/56 | HR 61 | Wt 220.8 lb

## 2024-01-03 DIAGNOSIS — R058 Other specified cough: Secondary | ICD-10-CM | POA: Diagnosis not present

## 2024-01-03 DIAGNOSIS — N1832 Chronic kidney disease, stage 3b: Secondary | ICD-10-CM

## 2024-01-03 DIAGNOSIS — I428 Other cardiomyopathies: Secondary | ICD-10-CM | POA: Insufficient documentation

## 2024-01-03 DIAGNOSIS — I2510 Atherosclerotic heart disease of native coronary artery without angina pectoris: Secondary | ICD-10-CM | POA: Diagnosis not present

## 2024-01-03 DIAGNOSIS — I5020 Unspecified systolic (congestive) heart failure: Secondary | ICD-10-CM

## 2024-01-03 DIAGNOSIS — J449 Chronic obstructive pulmonary disease, unspecified: Secondary | ICD-10-CM | POA: Insufficient documentation

## 2024-01-03 DIAGNOSIS — F1491 Cocaine use, unspecified, in remission: Secondary | ICD-10-CM | POA: Insufficient documentation

## 2024-01-03 DIAGNOSIS — E1122 Type 2 diabetes mellitus with diabetic chronic kidney disease: Secondary | ICD-10-CM

## 2024-01-03 DIAGNOSIS — I480 Paroxysmal atrial fibrillation: Secondary | ICD-10-CM

## 2024-01-03 DIAGNOSIS — R002 Palpitations: Secondary | ICD-10-CM | POA: Insufficient documentation

## 2024-01-03 DIAGNOSIS — R079 Chest pain, unspecified: Secondary | ICD-10-CM | POA: Diagnosis not present

## 2024-01-03 DIAGNOSIS — Z95810 Presence of automatic (implantable) cardiac defibrillator: Secondary | ICD-10-CM | POA: Diagnosis not present

## 2024-01-03 DIAGNOSIS — Z79899 Other long term (current) drug therapy: Secondary | ICD-10-CM | POA: Diagnosis not present

## 2024-01-03 DIAGNOSIS — I130 Hypertensive heart and chronic kidney disease with heart failure and stage 1 through stage 4 chronic kidney disease, or unspecified chronic kidney disease: Secondary | ICD-10-CM | POA: Insufficient documentation

## 2024-01-03 DIAGNOSIS — Z7984 Long term (current) use of oral hypoglycemic drugs: Secondary | ICD-10-CM | POA: Diagnosis not present

## 2024-01-03 DIAGNOSIS — R0602 Shortness of breath: Secondary | ICD-10-CM | POA: Diagnosis not present

## 2024-01-03 DIAGNOSIS — R5383 Other fatigue: Secondary | ICD-10-CM | POA: Insufficient documentation

## 2024-01-03 DIAGNOSIS — I10 Essential (primary) hypertension: Secondary | ICD-10-CM | POA: Diagnosis not present

## 2024-01-03 DIAGNOSIS — F17210 Nicotine dependence, cigarettes, uncomplicated: Secondary | ICD-10-CM | POA: Diagnosis not present

## 2024-01-03 DIAGNOSIS — Z7901 Long term (current) use of anticoagulants: Secondary | ICD-10-CM | POA: Diagnosis not present

## 2024-01-03 DIAGNOSIS — Z720 Tobacco use: Secondary | ICD-10-CM

## 2024-01-03 DIAGNOSIS — N183 Chronic kidney disease, stage 3 unspecified: Secondary | ICD-10-CM | POA: Insufficient documentation

## 2024-01-03 MED ORDER — ENTRESTO 49-51 MG PO TABS: 1.0000 | ORAL_TABLET | Freq: Two times a day (BID) | ORAL | 3 refills | Status: DC

## 2024-01-03 MED ORDER — CARVEDILOL 6.25 MG PO TABS: 6.2500 mg | ORAL_TABLET | Freq: Two times a day (BID) | ORAL | 3 refills | Status: DC

## 2024-01-03 NOTE — Progress Notes (Signed)
 [facility] - HEART FAILURE CLINIC - PHARMACIST COUNSELING NOTE  Jorge Cruz is a 73 y.o. year old male who presents to the HF clinic for follow-up post hospitalization. They have a past medical history significant for nonobstructive coronary disease, chronic HFrEF status post ICD (12/2010), paroxysmal atrial fibrillation, type 2 diabetes, CKD stage III, hypertension, COPD, polysubstance abuse. Initially, they were diagnosed with HFrEF on 8/17 secondary to suspected hypertension with an EF of 20-25%. At their last appointment on 10/18/2023, pre hospitalization on 12/25/2023, no medication changes were made. At this time was also considering the   Current Guideline-Directed Medical Therapy (GDMT)  ACE/ARB/ARNI: Sacubitril -valsartan  97-103 mg twice daily Beta Blocker: Carvedilol  6.25 mg twice daily Aldosterone Antagonist: NA Diuretic: Furosemide  40 mg daily SGLT2i: Dapagliflozin  10 mg daily  Vital Signs and Trends  Recent Trends BP Readings from Last 3 Encounters:  12/28/23 130/61  12/25/23 138/77  12/14/23 117/76   Wt Readings from Last 3 Encounters:  12/28/23 220 lb 6.4 oz (100 kg)  12/25/23 216 lb (98 kg)  12/14/23 216 lb (98 kg)   Today's visit HR 61 BP 90/56 - feeling a little dizzy  Weight (pounds) 220.8 lbs  Do you check your weight daily? [x] Yes [] No  Do you check you blood pressure daily [] Yes [x] No  PTA: 220 lbs then couple days later sent to 211 then it sent back up to 216 lbs. (Drinking more water, 4 bottles per day sometime more, and a soda per day) PTA BP:   Cardiac Imaging  Echo 04/01/16: EF of 20-25% along with mild MR.  Echo 11/25/17: EF of 20-25% along with a mildly elevated PA pressure of 35 mm Hg. Echo 10/11/19: EF of 30-35% along with trivial MR. Echo 07/06/20: EF of 25%. Echo 08/20/21: EF of 30-35%.    Echo 02/08/23: EF 25-30% with mild LVH   Changes Since Last Visit   Hospitalizations/ED visits (last 6 months):  -- Date 12/25/2023,  CC COPD exacerbation  Medication changes: add back Farxiga  in the hospital  Pertinent Labs  Lab Results  Component Value Date   NA 139 12/28/2023   CL 100 12/28/2023   K 4.5 12/28/2023   CO2 31 12/28/2023   BUN 46 (H) 12/28/2023   CREATININE 1.08 12/28/2023   GFRNONAA >60 12/28/2023   CALCIUM  8.6 (L) 12/28/2023   PHOS 3.3 10/12/2019   ALBUMIN 3.7 12/25/2023   GLUCOSE 176 (H) 12/28/2023   Lab Results  Component Value Date   LDLCALC 33 09/02/2021   Lab Results  Component Value Date   HGBA1C 7.2 (H) 08/09/2023    ASSESSMENT  Reason for today's visit   To assess current guideline directed medical therapy. This is a routine follow-up after hospitalization.   Guideline-Directed Medical Therapy Evaluation  ACEi/ARB/ARNi Target Achieved [x] Yes [] No Up-titration candidate today [] Yes [x] No Safety Concerns []  Hypotension []  Hyperkalemia Other ***  Beta-Blocker  Target Achieved [] Yes [x] No Up-titration candidate today [] Yes [x] No Safety Concerns []  Bradycardia []  Hypotension []  Asthma/COPD Other ***  MRA Patient is not on therapy because of intolerance (hyperkalemia) to spironolactone  in the past. Could consider Kerendia.  APPROVED from 12/27/2023 to 08/21/2024. Copay is $0.00   SGLT-2 Target Achieved [x] Yes [] No Up-titration candidate today [] Yes [x] No Safety Concerns []  eGFR < 20 []  Urinary/yeast infection Other ***  Loop  Increase dose today  [] Yes [] No Safety Concerns []  Hypotension []  Hypokalemia []  Increased Scr Other ***  Heart Failure Symptoms & Volume Status   Dyspnea on exertion [] Yes []   No  Fatigue [] Yes [] No  Lower extremity edema [] Yes [] No  Weight gain (> 5 lbs) [] Yes [] No  Cough or wheezing  [] Yes [] No  Abdominal bloating/Discomfort [] Yes [] No  Early satiety or poor appetite [] Yes [] No  Dizziness of lightheadedness  [] Yes [] No  # Pillows used at night:   Adherence Assessment  Do you ever forget to take your medication? [] Yes [] No   Do you ever skip doses due to side effects? [] Yes [] No  Do you have trouble affording your medicines? [] Yes [] No  Are you ever unable to pick up your medication due to transportation difficulties? [] Yes [] No  Do you ever stop taking your medications because you don't believe they are helping? [] Yes [] No   Adherence strategy: *** Barriers to obtaining medications: *** Did you take your medications before your appointment today: [] Yes [x] No  Preventative Care   Parameter Most Recent Result Goal/Status Current Medications  LDL 33 < 70 - 55 mg/dL Within goal Statin: atorvastatin  80 mg daily  A1c 7.2  7 - 8 % Within goal Metformin  1000 mg BID  BP Control *** < 130/80 *** Additional: ***  ECHO 25-30% 6 - 12 months Last 02/08/2023   PLAN  Medication Interventions:  -- Can consider starting Kerendia 10 mg daily (approved $0 co-pay) -- Continue current regimen per NP Preventative Care Follow-up:  -- Could consider up dated lipid panel Lab or imaging orders:  -- Annual ECHO due this June  Time spent: 15 minutes  Thank you for allowing pharmacy to participate in this patient's care.   Malvina Schadler K Aris Even, Pharm.D. Pharmacy Resident 01/03/2024 8:15 AM

## 2024-01-03 NOTE — Patient Instructions (Signed)
 Medication Changes:  START CARVEDILOL  6.25 MG TWICE DAILY  DECREASE ENTRESTO  TO 49-21 MG TWICE DAILY (YOU CAN CUT YOUR CURRENT TABLET IN HALF)   Lab Work:  Go DOWN to LOWER LEVEL (LL) to have your blood work completed inside of [employer] office.  We will only call you if the results are abnormal or if the provider would like to make medication changes.   Follow-Up in: 1 MONTH WITH Shawnee Dellen, FNP.  At the Advanced Heart Failure Clinic, you and your health needs are our priority. We have a designated team specialized in the treatment of Heart Failure. This Care Team includes your primary Heart Failure Specialized Cardiologist (physician), Advanced Practice Providers (APPs- Physician Assistants and Nurse Practitioners), and Pharmacist who all work together to provide you with the care you need, when you need it.   You may see any of the following providers on your designated Care Team at your next follow up:  Dr. Jules Oar Dr. Peder Bourdon Dr. Alwin Baars Dr. Judyth Nunnery Shawnee Dellen, FNP Bevely Brush, RPH-CPP  Please be sure to bring in all your medications bottles to every appointment.   Need to Contact Us :  If you have any questions or concerns before your next appointment please send us  a message through [place] or call our office at [phone].    TO LEAVE A MESSAGE FOR THE NURSE SELECT OPTION 2, PLEASE LEAVE A MESSAGE INCLUDING: YOUR NAME DATE OF BIRTH CALL BACK NUMBER REASON FOR CALL**this is important as we prioritize the call backs  YOU WILL RECEIVE A CALL BACK THE SAME DAY AS LONG AS YOU CALL BEFORE 4:00 PM

## 2024-01-04 ENCOUNTER — Ambulatory Visit: Payer: Self-pay | Admitting: Family

## 2024-01-04 LAB — BASIC METABOLIC PANEL WITH GFR
BUN/Creatinine Ratio: 22 (ref 10–24)
BUN: 29 mg/dL — ABNORMAL HIGH (ref 8–27)
CO2: 28 mmol/L (ref 20–29)
Calcium: 9.3 mg/dL (ref 8.6–10.2)
Chloride: 99 mmol/L (ref 96–106)
Creatinine, Ser: 1.33 mg/dL — ABNORMAL HIGH (ref 0.76–1.27)
Glucose: 143 mg/dL — ABNORMAL HIGH (ref 70–99)
Potassium: 4.3 mmol/L (ref 3.5–5.2)
Sodium: 143 mmol/L (ref 134–144)
eGFR: 57 mL/min/{1.73_m2} — ABNORMAL LOW (ref >59)

## 2024-01-25 ENCOUNTER — Other Ambulatory Visit: Payer: Self-pay | Admitting: Family

## 2024-02-02 ENCOUNTER — Telehealth: Payer: Self-pay | Admitting: Family

## 2024-02-05 ENCOUNTER — Other Ambulatory Visit: Payer: Self-pay

## 2024-02-05 NOTE — Telephone Encounter (Signed)
 error

## 2024-02-05 NOTE — Telephone Encounter (Signed)
 Received message that pt needs refills of carvedilol . Called pt to verify which pharmacy is preferred. Refills of carvedilol  sent to [facility].

## 2024-02-13 ENCOUNTER — Telehealth: Payer: Self-pay | Admitting: Family

## 2024-02-13 ENCOUNTER — Encounter: Payer: Self-pay | Admitting: Cardiology

## 2024-02-13 ENCOUNTER — Ambulatory Visit: Attending: Cardiology | Admitting: Cardiology

## 2024-02-13 VITALS — BP 114/67 | HR 78 | Ht 67.5 in | Wt 213.0 lb

## 2024-02-13 DIAGNOSIS — E1122 Type 2 diabetes mellitus with diabetic chronic kidney disease: Secondary | ICD-10-CM

## 2024-02-13 DIAGNOSIS — I10 Essential (primary) hypertension: Secondary | ICD-10-CM

## 2024-02-13 DIAGNOSIS — N1832 Chronic kidney disease, stage 3b: Secondary | ICD-10-CM

## 2024-02-13 DIAGNOSIS — I5020 Unspecified systolic (congestive) heart failure: Secondary | ICD-10-CM

## 2024-02-13 DIAGNOSIS — I2510 Atherosclerotic heart disease of native coronary artery without angina pectoris: Secondary | ICD-10-CM

## 2024-02-13 DIAGNOSIS — I480 Paroxysmal atrial fibrillation: Secondary | ICD-10-CM | POA: Diagnosis not present

## 2024-02-13 DIAGNOSIS — N182 Chronic kidney disease, stage 2 (mild): Secondary | ICD-10-CM

## 2024-02-13 DIAGNOSIS — E782 Mixed hyperlipidemia: Secondary | ICD-10-CM

## 2024-02-13 NOTE — Patient Instructions (Signed)
 Medication Instructions:  Your physician recommends that you continue on your current medications as directed. Please refer to the Current Medication list given to you today.   *If you need a refill on your cardiac medications before your next appointment, please call your pharmacy*  Lab Work: No labs ordered today  If you have labs (blood work) drawn today and your tests are completely normal, you will receive your results only by: MyChart Message (if you have MyChart) OR A paper copy in the mail If you have any lab test that is abnormal or we need to change your treatment, we will call you to review the results.  Testing/Procedures: No test ordered today   Follow-Up: At [facility], you and your health needs are our priority.  As part of our continuing mission to provide you with exceptional heart care, our providers are all part of one team.  This team includes your primary Cardiologist (physician) and Advanced Practice Providers or APPs (Physician Assistants and Nurse Practitioners) who all work together to provide you with the care you need, when you need it.  Your next appointment:   5 month(s)  Provider:   Lonni Hanson, MD or Tylene Lunch, NP

## 2024-02-13 NOTE — Telephone Encounter (Signed)
 Called to confirm/remind patient of their appointment at the [facility] on 02/14/24.   Appointment:   [x] Confirmed  [] Left mess   [] No answer/No voice mail  [] VM Full/unable to leave message  [] Phone not in service  Patient reminded to bring all medications and/or complete list.  Confirmed patient has transportation. Gave directions, instructed to utilize valet parking.

## 2024-02-13 NOTE — Progress Notes (Signed)
 Cardiology Office Note   Date:  02/13/2024  ID:  Jorge Cruz, Jorge Cruz May 27, 1951, MRN [id number] PCP: [facility]  [facility] Providers Cardiologist:  Lonni Hanson, MD     History of Present Illness Jorge Cruz is a 73 y.o. male with a past medical history of nonobstructive coronary artery disease, chronic HFrEF status post ICD (12/2010), paroxysmal atrial fibrillation, type 2 diabetes, CKD stage III, hypertension, COPD, polysubstance abuse, is here today for follow-up.   Jorge Cruz underwent remote treadmill MPI in 03/2016 that showed no evidence of perfusion defects with an EF of less than 30% and was overall an intermediate risk study. Blood pressure demonstrated a hypertensive response to exercise. Echo at that time demonstrated an EF of 20 to 25%, grade 1 diastolic dysfunction, mild concentric LVH, and mild mitral regurgitation. Subsequent R/LHC in 05/2017 showed mild nonobstructive CAD as outlined below with an EF of 20%. RHC showed normal filling pressures, normal pulmonary pressure, and moderately reduced cardiac output. Over the years, he has had a persistent cardiomyopathy with echo from 07/2021 demonstrating an EF of 30 to 35%, global hypokinesis, mildly dilated LV internal cavity size, grade 1 diastolic dysfunction, mildly reduced RV systolic function with mildly enlarged ventricular cavity size, no significant valvular abnormalities, and an estimated right atrial pressure of 3 mmHg. He underwent ICD implantation at [facility] in 2021. He has intermittently been followed by both [facility] cardiology, [facility] cardiology, [facility], and the [facility] more recently. Most recent echo from 01/2023 showed an EF of 25-30%, global hypokinesis, mild LVH, low normal RVSF with normal ventricular cavity size, and an estimated right atrial pressure of 3 mmHg.  He was evaluated in the emergency department in late 06/2023 with complaints of cough.  BNP at  that time was 637 with a high-sensitivity troponin peaking at 92.  Chest x-ray was without acute cardiopulmonary process.  He was given DuoNebs advised to take his Lasix  daily he was subsequently discharged home.  He was scheduled outpatient follow-up with advanced heart failure but was a no-show for his appointment 08/07/2023.  He was admitted to [facility] 08/09/2023 for shortness of breath, dry cough, and abdominal swelling.  Chest x-ray showed mild cardiomegaly with no active cardiopulmonary process.  High-sensitivity troponin 0.25 with a delta 155, peaking at 159 trending down to 148.  BNP 84, COVID/influenza/RSV SV PCR swab was negative.  He was diuresed and started on GDMT and outpatient appointments were made with advanced heart failure.  He was considered stable for discharge and was discharged home on 08/11/2023.  He was previously seen in clinic 09/12/2019 stating overall he had been doing well.  He continued to have occasional shortness of breath.  States he had been compliant with his current medication regimen.  Did have some complaints of intermittent claudication was scheduled for ABIs.  He was seen in clinic 12/12/2023 and overall been doing well with cardiac perspective.  He had noticed a slight uptick in his weight but it is since resolved.  He continued to have his shortness of breath that was unchanged.  States he had been compliant with his current medications.  Denies any missed doses of his apixaban .  He was sent for updated lab slip.  But there were no medication changes that were made to his current regiment.   He was hospitalized at [facility] 5/5 of-12/28/2023 for respiratory distress.  He arrived to the hospital had to be placed on BiPAP with 40% oxygen.  ABG  showed significant CO2 retention, serum creatinine was up to 1.36, BNP of 876, high-sensitivity troponin peaked at 284.  He was placed on IV Lasix  and steroids.  Was able to be weaned off of BiPAP the morning of 5/6.  He stated manage and  shortness of breath and nonproductive cough that he had been experienced for approximately 5 days prior to arrival.  During that time he also noted fluctuations in his weight and abdominal distention.  His wife at the bedside felt that his abdomen appeared bigger than normal.  He was started on breathing treatments and discharged home with doxycycline .  After being evaluated in the emergency department earlier today prior to admission.  He stated he was stable after arriving home and his shortness of breath rapidly progressed and became severe.  He began to experience chest pressure when he was in respiratory distress but noted that the pressure improvement in shortness of breath improved.  Chest x-ray revealed enlarged heart with no overt edema.  He was treated with prednisone  and Solu-Medrol .  It was thought that his shortness of breath was related to COPD exacerbation versus heart failure.  He returns to clinic today accompanied by his wife.  He states overall from a cardiac perspective he has been doing well.  Denies any chest pain, shortness of breath, or weight gain.  States that he has been compliant with his current medications with any adverse side effects.  Stated he had recently followed up with advanced heart failure and had his Entresto  dosing reduced.  States that he has additional follow-up today.  He has not missed any of his apixaban  and denies any bleeding with no blood noted in his urine or stool.  ROS: 10 point review of system has been reviewed and considered negative except ones been listed in the HPI  Studies Reviewed EKG Interpretation Date/Time:  Tuesday February 13 2024 10:27:55 EDT Ventricular Rate:  78 PR Interval:  196 QRS Duration:  110 QT Interval:  400 QTC Calculation: 456 R Axis:   7  Text Interpretation: Sinus rhythm with Premature ventricular complexes Inferior infarct , old When compared with ECG of 03-Jan-2024 10:43, No significant change since last tracing Confirmed  by Gerard Frederick ([id number]) on 02/13/2024 11:38:52 AM    2D echo 02/08/2023 1. Left ventricular ejection fraction, by estimation, is 25 to 30%. The  left ventricle has severely decreased function. The left ventricle  demonstrates global hypokinesis. There is mild left ventricular  hypertrophy. Left ventricular diastolic parameters   are indeterminate.   2. Right ventricular systolic function is low normal. The right  ventricular size is normal.   3. The mitral valve is normal in structure. No evidence of mitral valve  regurgitation.   4. The aortic valve is grossly normal. Aortic valve regurgitation is not  visualized.   5. The inferior vena cava is normal in size with greater than 50%  respiratory variability, suggesting right atrial pressure of 3 mmHg.    2D echo 08/20/2021 1. Left ventricular ejection fraction, by estimation, is 30 to 35%. The  left ventricle has moderate to severely decreased function. The left  ventricle demonstrates global hypokinesis. The left ventricular internal  cavity size was mildly dilated. Left  ventricular diastolic parameters are consistent with Grade I diastolic  dysfunction (impaired relaxation).   2. Right ventricular systolic function is mildly reduced. The right  ventricular size is mildly enlarged.   3. The mitral valve is normal in structure. No evidence of mitral valve  regurgitation.   4. The aortic valve is tricuspid. Aortic valve regurgitation is not  visualized.   5. The inferior vena cava is normal in size with greater than 50%  respiratory variability, suggesting right atrial pressure of 3 mmHg.   Risk Assessment/Calculations  CHA2DS2-VASc Score = 4   This indicates a 4.8% annual risk of stroke. The patient's score is based upon: CHF History: 1 HTN History: 1 Diabetes History: 1 Stroke History: 0 Vascular Disease History: 0 Age Score: 1 Gender Score: 0            Physical Exam VS:  BP 114/67   Pulse 78   Ht 5' 7.5 (1.715  m)   Wt 213 lb (96.6 kg)   SpO2 95%   BMI 32.87 kg/m        Wt Readings from Last 3 Encounters:  02/13/24 213 lb (96.6 kg)  01/03/24 220 lb 12.8 oz (100.2 kg)  12/28/23 220 lb 6.4 oz (100 kg)    GEN: Well nourished, well developed in no acute distress NECK: No JVD; No carotid bruits CARDIAC: RRR, no murmurs, rubs, gallops RESPIRATORY:  Clear to auscultation without rales, wheezing or rhonchi  ABDOMEN: Soft, non-tender, non-distended EXTREMITIES:  No edema; No deformity   ASSESSMENT AND PLAN Chronic HFrEF secondary to NICM s/p ICD where he continues to follow with ICD at [facility].  Last echocardiogram in 01/2023 revealed LVEF of 25/30% with mild LVH.  Weight has remained stable.  Previous appointment with advanced heart failure discussed repeating echocardiogram versus cardiac MRI.  Upcoming follow-up today with advanced heart failure allow advanced heart failure clinic to order either updated echo or cardiac MRI as previously listed in her note.  He appears to be euvolemic on exam today.  He has been continued on GDMT of Entresto  49/51 mg twice daily, carvedilol  6.25 mg twice daily, Farxiga  10 mg daily furosemide  40 mg daily.  Unfortunately was intolerant of MRA therapy with recurrent hyperkalemia.  He recently had a BMP that was completed which showed stable kidney function and electrolytes.  Will await further testing from advanced heart failure as he has not been on GDMT and is now having to have reduction in current therapy.  Nonobstructive coronary artery disease where he continues to deny chest discomfort.  EKG today reveals sinus rhythm with a rate of 78 with unifocal PVC with an old anterior infarct with no significant changes.  He is continued apixaban  5 mg twice daily and lieu of aspirin  and atorvastatin  80 mg daily.  Paroxysmal atrial fibrillation where he has remained in sinus rhythm on EKG today.  He is continued on apixaban  5 mg twice daily for CHA2DS2-VASc score of at least 4 for  stroke prophylaxis and carvedilol  6.25 mg twice daily for rate control.  He has tolerated anticoagulant without any concerns of bleeding.  No blood noted in his urine or stool at this time.  CKD stage II-III with his most recent serum creatinine being at baseline.  He is continued on his current medication regimen and encouraged to continue to avoid NSAIDs.  Primary hypertension with a blood pressure today 117/67.  He is continued on carvedilol , furosemide , Entresto .  He has also been advised to continue to monitor his pressure 1 to 2 hours postmedication administration as well.  Mixed hyperlipidemia where he is overdue for an updated lipid panel.  At he is continued on atorvastatin  80 mg daily.  Ongoing management per his PCP.  Type 2 diabetes with his last hemoglobin A1c  of 7.2.  He has continued on metformin  and Farxiga .  Ongoing management per his PCP.       Dispo: Patient to return to clinic to see MD/APP in 6 months or sooner if needed for further evaluation.  Signed, Theoren Palka, NP

## 2024-02-14 ENCOUNTER — Ambulatory Visit: Attending: Family | Admitting: Family

## 2024-02-14 ENCOUNTER — Encounter: Payer: Self-pay | Admitting: Family

## 2024-02-14 VITALS — BP 119/63 | HR 81 | Wt 215.0 lb

## 2024-02-14 DIAGNOSIS — J449 Chronic obstructive pulmonary disease, unspecified: Secondary | ICD-10-CM | POA: Diagnosis not present

## 2024-02-14 DIAGNOSIS — Z95810 Presence of automatic (implantable) cardiac defibrillator: Secondary | ICD-10-CM | POA: Diagnosis not present

## 2024-02-14 DIAGNOSIS — I10 Essential (primary) hypertension: Secondary | ICD-10-CM | POA: Diagnosis not present

## 2024-02-14 DIAGNOSIS — I5022 Chronic systolic (congestive) heart failure: Secondary | ICD-10-CM | POA: Diagnosis not present

## 2024-02-14 DIAGNOSIS — Z79899 Other long term (current) drug therapy: Secondary | ICD-10-CM | POA: Insufficient documentation

## 2024-02-14 DIAGNOSIS — I480 Paroxysmal atrial fibrillation: Secondary | ICD-10-CM | POA: Diagnosis not present

## 2024-02-14 DIAGNOSIS — N1832 Chronic kidney disease, stage 3b: Secondary | ICD-10-CM

## 2024-02-14 DIAGNOSIS — I130 Hypertensive heart and chronic kidney disease with heart failure and stage 1 through stage 4 chronic kidney disease, or unspecified chronic kidney disease: Secondary | ICD-10-CM | POA: Insufficient documentation

## 2024-02-14 DIAGNOSIS — Z7984 Long term (current) use of oral hypoglycemic drugs: Secondary | ICD-10-CM | POA: Insufficient documentation

## 2024-02-14 DIAGNOSIS — Z7901 Long term (current) use of anticoagulants: Secondary | ICD-10-CM | POA: Diagnosis not present

## 2024-02-14 DIAGNOSIS — F17210 Nicotine dependence, cigarettes, uncomplicated: Secondary | ICD-10-CM | POA: Diagnosis not present

## 2024-02-14 DIAGNOSIS — I428 Other cardiomyopathies: Secondary | ICD-10-CM | POA: Diagnosis not present

## 2024-02-14 DIAGNOSIS — N183 Chronic kidney disease, stage 3 unspecified: Secondary | ICD-10-CM | POA: Insufficient documentation

## 2024-02-14 DIAGNOSIS — I2510 Atherosclerotic heart disease of native coronary artery without angina pectoris: Secondary | ICD-10-CM | POA: Insufficient documentation

## 2024-02-14 DIAGNOSIS — E1122 Type 2 diabetes mellitus with diabetic chronic kidney disease: Secondary | ICD-10-CM | POA: Insufficient documentation

## 2024-02-14 DIAGNOSIS — Z720 Tobacco use: Secondary | ICD-10-CM

## 2024-02-14 DIAGNOSIS — I5020 Unspecified systolic (congestive) heart failure: Secondary | ICD-10-CM

## 2024-02-14 NOTE — Progress Notes (Signed)
 Advanced Heart Failure Clinic Note    PCP: [facility] (last seen 11/23) Primary Cardiologist: Mady Bruckner, MD / Gerard Frederick, NP (last seen 06/25)  Chief Complaint: shortness of breath   HPI:  Jorge Cruz is a 73 y/o male with a history of DM, COPD, HTN, nonobstructive CAD, PAF, CKD, tobacco use and chronic heart failure. Had ICD implanted 05/21 monitored through [facility]. [place] off a building [place] 1987 head first. Has difficulty reading.   Echo 10/11/19: EF of 30-35% along with trivial Jorge. Echo 07/06/20: EF of 25%. Echo 08/20/21: EF of 30-35%.     Was in the ED 07/23/22 due to an acute cough. Thought to be due to bronchitis.    Echo 02/08/23: EF 25-30% with mild LVH   Admitted 08/09/23 due to shortness of breath and cough for > 1 week. Noted 10 pound weight gain. Admits to dietary indiscretion and inconsistent medication compliance. Initially needed oxygen but able to be weaned off. IV diuresed. Cardiology consulted. Patient reports significant improvement and he was released.   Admitted 12/25/23 with shortness of breath, weight gain, abdominal distention and nonproductive cough. He had significant respiratory distress, was placed on BiPAP with 40% oxygen. ABG showed significant CO2 retention, creatinine 1.36, BNP 876, troponin peak at 284. Patient is placed on IV Lasix  and steroids. Weaned off bipap => nasal cannula => to room air.   Seen in [facility] 05/25 and had entresto  decreased to 49/51mg  due to hypotension.   He presents today for a HF follow-up visit with a chief complaint of minimal shortness of breath. Has occasional dizziness. Sleeping well on 1-2 pillows. Denies fatigue, chest pain, palpitations, abdominal distention, pedal edema, weight gain.   Smoking 1/3 ppd of cigarettes and last drank ~ 1 month ago. Says that his goal for 2025 is to quit both tobacco and alcohol.   Used cocaine for ~ 30 years but quit that years ago.   Previous cardiac studies:  Remote  treadmill MPI in 03/2016 showed no evidence of perfusion defects with an EF of less than 30% and was overall an intermediate risk study.   Echo 04/01/16: EF of 20-25% along with mild Jorge.  Echo 11/25/17: EF of 20-25% along with a mildly elevated PA pressure of 35 mm Hg.   Cardiac catheterization 06/01/17: EF of 20% along with mild nonobstructive CAD. RHC also done which showed normal filling pressures, normal pulmonary pressure and moderately reduced cardiac output. Cardiac output was 3.82 with a cardiac index of 1.83.  ROS: All systems negative except as listed in HPI, PMH and Problem List.  SH:  Social History   Socioeconomic History   Marital status: Married    Spouse name: Not on file   Number of children: Not on file   Years of education: Not on file   Highest education level: Not on file  Occupational History   Not on file  Tobacco Use   Smoking status: Every Day    Current packs/day: 0.50    Average packs/day: 0.5 packs/day for 52.0 years (26.0 ttl pk-yrs)    Types: Cigarettes   Smokeless tobacco: Never  Vaping Use   Vaping status: Never Used  Substance and Sexual Activity   Alcohol use: Yes    Alcohol/week: 4.0 - 5.0 standard drinks of alcohol    Types: 1 - 2 Cans of beer, 3 Shots of liquor per week    Comment: occassional drink; past-1/2 to 1 pint liquor a week   Drug use: Not Currently  Types: Cocaine    Comment: 3 years   Sexual activity: Not Currently  Other Topics Concern   Not on file  Social History Narrative   Not on file   Social Drivers of Health   Financial Resource Strain: Low Risk  (08/10/2023)   Overall Financial Resource Strain (CARDIA)    Difficulty of Paying Living Expenses: Not very hard  Food Insecurity: No Food Insecurity (12/27/2023)   Hunger Vital Sign    Worried About Running Out of Food in the Last Year: Never true    Ran Out of Food in the Last Year: Never true  Transportation Needs: No Transportation Needs (12/27/2023)   PRAPARE -  Administrator, Civil Service (Medical): No    Lack of Transportation (Non-Medical): No  Physical Activity: Not on file  Stress: Not on file  Social Connections: Moderately Isolated (12/27/2023)   Social Connection and Isolation Panel    Frequency of Communication with Friends and Family: More than three times a week    Frequency of Social Gatherings with Friends and Family: More than three times a week    Attends Religious Services: Never    Database administrator or Organizations: No    Attends Banker Meetings: Never    Marital Status: Married  Catering manager Violence: Not At Risk (12/27/2023)   Humiliation, Afraid, Rape, and Kick questionnaire    Fear of Current or Ex-Partner: No    Emotionally Abused: No    Physically Abused: No    Sexually Abused: No    FH:  Family History  Problem Relation Age of Onset   Diabetes Mother    Diabetes Father     Past Medical History:  Diagnosis Date   CHF (congestive heart failure) (HCC)    Chronic combined systolic (congestive) and diastolic (congestive) heart failure (HCC)    a. 03/2014 Echo: EF 45%, glob HK; b. 03/2016 Echo: EF 20-25%, diff HK, Gr1 DD, mild Jorge; b. 11/2017 Echo: EF 20%, diff HK, Gr2 DD, mildly to mod reduced RV fxn, PASP .   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 03/2014 Echo: EF 45%, glob HK, mild conc LVH; b. 03/2014 MV: possible mild ischemia, superimposed on small inf infarct, EF 36%; c. 03/2016 MV: EF <30%, no ischemia; d. 03/2016 Echo: EF 20-25%, diff HK, Gr1 DD, mild Jorge; e. 05/2017 Cath: nonobs dzs, EF 20%; f. 11/2017 Echo: EF 20%, diff HK, Gr2 DD.   Non-obstructive CAD (coronary artery disease)     a. 05/2017 Cath: LM nl, LAD 42m, LCX 75m, RCA 49m, EF 20%.   NSTEMI (non-ST elevated myocardial infarction) (HCC) 05/31/2017   Paroxysmal atrial fibrillation (HCC)    Polysubstance abuse (HCC)    Tobacco abuse     Current Outpatient Medications  Medication Sig Dispense Refill   albuterol  (PROVENTIL  HFA;VENTOLIN  HFA) 108 (90 Base) MCG/ACT inhaler Inhale 2 puffs into the lungs every 6 (six) hours as needed for wheezing or shortness of breath.     allopurinol  (ZYLOPRIM ) 100 MG tablet Take 1 tablet by mouth daily.     apixaban  (ELIQUIS ) 5 MG TABS tablet Take 1 tablet (5 mg total) by mouth 2 (two) times  daily. 180 tablet 1   atorvastatin  (LIPITOR ) 80 MG tablet Take 1 tablet (80 mg total) by mouth daily. 90 tablet 1   benzonatate  (TESSALON ) 200 MG capsule Take 1 capsule (200 mg total) by mouth 3 (three) times daily as needed for cough. 21 capsule 0   carvedilol  (COREG ) 6.25 MG tablet TAKE 1 TABLET (6.25MG ) BY MOUTH TWICE DAILY 180 tablet 11   clotrimazole  (CLOTRIMAZOLE  ANTI-FUNGAL) 1 % cream Apply 1 Application topically 2 (two) times daily. Apply to the foreskin and head of the penis 30 g 0   doxycycline  (ADOXA) 100 MG tablet Take 100 mg by mouth 2 (two) times daily.     FARXIGA  10 MG TABS tablet TAKE 1 TABLET (10 MG TOTAL) BY MOUTH DAILY BEFORE BREAKFAST. NEEDS FOLLOW UP APPOINTMENT FOR MORE REFILLS 30 tablet 5   fluticasone  (FLONASE ) 50 MCG/ACT nasal spray Place 2 sprays into both nostrils daily.     furosemide  (LASIX ) 40 MG tablet Take 1 tablet (40 mg total) by mouth daily. 90 tablet 3   guaiFENesin -dextromethorphan  (ROBITUSSIN DM) 100-10 MG/5ML syrup Take 5 mLs by mouth every 4 (four) hours as needed for cough. 118 mL 0   hydrocortisone cream 1 % Apply 1 Application topically 2 (two) times daily.     ipratropium-albuterol  (DUONEB) 0.5-2.5 (3) MG/3ML SOLN Take 3 mLs by nebulization 3 (three) times daily for 7 days, THEN 3 mLs every 6 (six) hours as needed for up to 21 days (SOB, wheezing).  720 mL 0   metFORMIN  (GLUCOPHAGE ) 1000 MG tablet Take 1,000 mg by mouth 2 (two) times daily.     nitroGLYCERIN  (NITROSTAT ) 0.4 MG SL tablet DISSOLVE 1 TABLET UNDER THE  TONGUE EVERY 5 MINUTES AS NEEDED FOR CHEST PAIN. MAX OF 3 TABLETS IN 15 MINUTES. CALL 911 IF PAIN  PERSISTS. 25 tablet 0   sacubitril -valsartan  (ENTRESTO ) 49-51 MG Take 1 tablet by mouth 2 (two) times daily. 180 tablet 3   No current facility-administered medications for this visit.   Vitals:   02/14/24 1004  BP: 119/63  Pulse: 81  SpO2: 100%  Weight: 215 lb (97.5 kg)   Wt Readings from Last 3 Encounters:  02/14/24 215 lb (97.5 kg)  02/13/24 213 lb (96.6 kg)  01/03/24 220 lb 12.8 oz (100.2 kg)   Lab Results  Component Value Date   CREATININE 1.33 (H) 01/03/2024   CREATININE 1.08 12/28/2023   CREATININE 1.26 (H) 12/27/2023    PHYSICAL EXAM:  General: Well appearing. No resp difficulty HEENT: normal Neck: supple, no JVD Cor: Regular rhythm, rate. No rubs, gallops or murmurs Lungs: clear Abdomen: soft, nontender, nondistended. Extremities: no cyanosis, clubbing, rash, edema Neuro: alert & oriented X 3. Moves all 4 extremities w/o difficulty. Affect pleasant   ECG: not done   ASSESSMENT & PLAN:  1: NICM with reduced ejection fraction- - likely due to HTN as cath in 2018 was nonobstructive - NYHA class II - euvolemic today - weight down 5 pounds since he was last here 6 weeks ago - Echo 07/06/20: EF of 25%.  - Echo 08/20/21: EF of 30-35%.  - Echo 02/08/23: EF 25-30% with mild LVH  - updated echo has been scheduled for 08/25 - continue carvedilol  6.25mg  BID - continue farxiga  10mg  daily - continue furosemide  40mg  daily - continue entresto  49/51mg  BID as BP is now much improved - previously had been on spironolactone  but he developed hyperkalemia, K+ 6.1 in 01/23 - consider kerendia 10mg  as this has been approved by his insurance  as $0 copay - BMET today - had ICD implantation 01/02/20; denies any  firing of ICD - [facility] EP referral placed today, per his preference (had previously been followed at [facility]) - using Mrs Hollis for seasoning  - BNP 12/25/23 was 876.4  2: HTN- - BP 119/63; improved - sees PCP at [facility]  - BMP 01/03/24 reviewed: sodium 143, potassium 4.3, creatinine 1.33 and GFR 57 - BMET today  3: DM- - A1c on 08/09/23 was 7.2%  4:Tobacco use-  - smoking 1/3 ppd of cigarettes & plans to quit this year - has not drank any alcohol in the last month - complete cessation discussed for 3 minutes with the patient  5: Non-obstructive CAD- - saw cardiology Reggy) 06/25 - continue atorvastatin  80mg  daily - LDL 09/02/21 was 33 - Cardiac catheterization 06/01/17: EF of 20% along with mild nonobstructive CAD. RHC also done which showed normal filling pressures,  normal pulmonary pressure and moderately reduced cardiac output. Cardiac output was 3.82 with a cardiac index of 1.83.  6: PAF- - continue apixaban  5mg  BID - HR regular today   Return 2 weeks after echo, sooner if needed.   Jorge Cruz Class, FNP 02/14/24

## 2024-02-14 NOTE — Patient Instructions (Signed)
 Medication Changes:  No changes  Lab Work:  Go DOWN to LOWER LEVEL (LL) to have your blood work completed inside of [employer] office.  We will only call you if the results are abnormal or if the provider would like to make medication changes.   Testing/Procedures:  Please have your echo completed. You will check in for this at the [facility]. You have to arrive 15 MINS EARLY for preparation, otherwise you will have to reschedule.   Referrals:  You have been referred to the Electrophysiologist. They will call you to arrange your appointment  Special Instructions // Education:    Follow-Up in:   At the [facility], you and your health needs are our priority. We have a designated team specialized in the treatment of Heart Failure. This Care Team includes your primary Heart Failure Specialized Cardiologist (physician), Advanced Practice Providers (APPs- Physician Assistants and Nurse Practitioners), and Pharmacist who all work together to provide you with the care you need, when you need it.   You may see any of the following providers on your designated Care Team at your next follow up:  Dr. Toribio Fuel Dr. Ezra Shuck Dr. Ria Commander Dr. Odis Brownie Ellouise Class, FNP Jaun Bash, RPH-CPP  Please be sure to bring in all your medications bottles to every appointment.   Need to Contact Us :  If you have any questions or concerns before your next appointment please send us  a message through [facility] or call our office at [phone].    TO LEAVE A MESSAGE FOR THE NURSE SELECT OPTION 2, PLEASE LEAVE A MESSAGE INCLUDING: YOUR NAME DATE OF BIRTH CALL BACK NUMBER REASON FOR CALL**this is important as we prioritize the call backs  YOU WILL RECEIVE A CALL BACK THE SAME DAY AS LONG AS YOU CALL BEFORE 4:00 PM

## 2024-02-15 ENCOUNTER — Ambulatory Visit: Payer: Self-pay | Admitting: Family

## 2024-02-15 DIAGNOSIS — I5020 Unspecified systolic (congestive) heart failure: Secondary | ICD-10-CM

## 2024-02-15 LAB — BASIC METABOLIC PANEL WITH GFR
BUN/Creatinine Ratio: 17 (ref 10–24)
BUN: 21 mg/dL (ref 8–27)
CO2: 22 mmol/L (ref 20–29)
Calcium: 9.6 mg/dL (ref 8.6–10.2)
Chloride: 102 mmol/L (ref 96–106)
Creatinine, Ser: 1.27 mg/dL (ref 0.76–1.27)
Glucose: 213 mg/dL — ABNORMAL HIGH (ref 70–99)
Potassium: 4.3 mmol/L (ref 3.5–5.2)
Sodium: 140 mmol/L (ref 134–144)
eGFR: 60 mL/min/{1.73_m2} (ref >59)

## 2024-03-29 ENCOUNTER — Other Ambulatory Visit: Payer: Self-pay

## 2024-03-29 ENCOUNTER — Telehealth: Payer: Self-pay | Admitting: Family

## 2024-03-29 MED ORDER — SACUBITRIL-VALSARTAN 49-51 MG PO TABS: 1.0000 | ORAL_TABLET | Freq: Two times a day (BID) | ORAL | 3 refills | Status: AC

## 2024-03-29 NOTE — Telephone Encounter (Signed)
Medication refilled to preferred pharmacy

## 2024-04-11 ENCOUNTER — Other Ambulatory Visit: Payer: Self-pay | Admitting: Family

## 2024-04-18 ENCOUNTER — Ambulatory Visit: Admission: RE | Admit: 2024-04-18 | Source: Ambulatory Visit

## 2024-04-30 ENCOUNTER — Telehealth: Payer: Self-pay | Admitting: Family

## 2024-04-30 NOTE — Telephone Encounter (Signed)
 Called to confirm/remind patient of their appointment at the [facility] on 05/01/24.   Appointment:   [] Confirmed  [] Left mess   [] No answer/No voice mail  [x] VM Full/unable to leave message  [] Phone not in service  Patient reminded to bring all medications and/or complete list.  Confirmed patient has transportation. Gave directions, instructed to utilize valet parking.

## 2024-05-01 ENCOUNTER — Telehealth: Payer: Self-pay | Admitting: Family

## 2024-05-01 ENCOUNTER — Encounter: Admitting: Family

## 2024-05-01 NOTE — Telephone Encounter (Signed)
 Patient did not show for his [facility] Failure Clinic appointment on 05/01/24. This is the 12th appointment that he has missed.

## 2024-06-03 ENCOUNTER — Other Ambulatory Visit: Payer: Self-pay

## 2024-06-03 ENCOUNTER — Emergency Department
Admission: EM | Admit: 2024-06-03 | Discharge: 2024-06-03 | Disposition: A | Discharge status: Home / Self Care | Attending: Emergency Medicine | Admitting: Emergency Medicine

## 2024-06-03 ENCOUNTER — Emergency Department

## 2024-06-03 DIAGNOSIS — Z720 Tobacco use: Secondary | ICD-10-CM | POA: Insufficient documentation

## 2024-06-03 DIAGNOSIS — E119 Type 2 diabetes mellitus without complications: Secondary | ICD-10-CM | POA: Insufficient documentation

## 2024-06-03 DIAGNOSIS — I5020 Unspecified systolic (congestive) heart failure: Secondary | ICD-10-CM | POA: Insufficient documentation

## 2024-06-03 DIAGNOSIS — J320 Chronic maxillary sinusitis: Secondary | ICD-10-CM | POA: Insufficient documentation

## 2024-06-03 DIAGNOSIS — N189 Chronic kidney disease, unspecified: Secondary | ICD-10-CM | POA: Diagnosis not present

## 2024-06-03 DIAGNOSIS — Z7901 Long term (current) use of anticoagulants: Secondary | ICD-10-CM | POA: Diagnosis not present

## 2024-06-03 DIAGNOSIS — R519 Headache, unspecified: Secondary | ICD-10-CM

## 2024-06-03 DIAGNOSIS — J441 Chronic obstructive pulmonary disease with (acute) exacerbation: Secondary | ICD-10-CM | POA: Diagnosis not present

## 2024-06-03 DIAGNOSIS — I6782 Cerebral ischemia: Secondary | ICD-10-CM | POA: Diagnosis not present

## 2024-06-03 DIAGNOSIS — R059 Cough, unspecified: Secondary | ICD-10-CM | POA: Diagnosis present

## 2024-06-03 DIAGNOSIS — I4891 Unspecified atrial fibrillation: Secondary | ICD-10-CM | POA: Diagnosis not present

## 2024-06-03 LAB — CBC
HCT: 43.8 % (ref 39.0–52.0)
Hemoglobin: 14.5 g/dL (ref 13.0–17.0)
MCH: 32.6 pg (ref 26.0–34.0)
MCHC: 33.1 g/dL (ref 30.0–36.0)
MCV: 98.4 fL (ref 80.0–100.0)
Platelets: 166 K/uL (ref 150–400)
RBC: 4.45 MIL/uL (ref 4.22–5.81)
RDW: 15 % (ref 11.5–15.5)
WBC: 4.4 K/uL (ref 4.0–10.5)
nRBC: 0 % (ref 0.0–0.2)

## 2024-06-03 LAB — BASIC METABOLIC PANEL WITH GFR
Anion gap: 11 (ref 5–15)
BUN: 30 mg/dL — ABNORMAL HIGH (ref 8–23)
CO2: 25 mmol/L (ref 22–32)
Calcium: 9.3 mg/dL (ref 8.9–10.3)
Chloride: 103 mmol/L (ref 98–111)
Creatinine, Ser: 1.33 mg/dL — ABNORMAL HIGH (ref 0.61–1.24)
GFR, Estimated: 56 mL/min — ABNORMAL LOW (ref >60)
Glucose, Bld: 108 mg/dL — ABNORMAL HIGH (ref 70–99)
Potassium: 4.3 mmol/L (ref 3.5–5.1)
Sodium: 139 mmol/L (ref 135–145)

## 2024-06-03 LAB — RESP PANEL BY RT-PCR (RSV, FLU A&B, COVID)  RVPGX2
Influenza A by PCR: NEGATIVE
Influenza B by PCR: NEGATIVE
Resp Syncytial Virus by PCR: NEGATIVE
SARS Coronavirus 2 by RT PCR: NEGATIVE

## 2024-06-03 LAB — TROPONIN I (HIGH SENSITIVITY)
Troponin I (High Sensitivity): 42 ng/L — ABNORMAL HIGH (ref <18)
Troponin I (High Sensitivity): 45 ng/L — ABNORMAL HIGH (ref <18)

## 2024-06-03 LAB — BRAIN NATRIURETIC PEPTIDE: B Natriuretic Peptide: 259.2 pg/mL — ABNORMAL HIGH (ref 0.0–100.0)

## 2024-06-03 MED ORDER — IPRATROPIUM-ALBUTEROL 0.5-2.5 (3) MG/3ML IN SOLN
9.0000 mL | Freq: Once | RESPIRATORY_TRACT | Status: AC
  Administered 2024-06-03: 9 mL via RESPIRATORY_TRACT
  Filled 2024-06-03: qty 9

## 2024-06-03 MED ORDER — DOXYCYCLINE MONOHYDRATE 100 MG PO TABS: 100.0000 mg | ORAL_TABLET | Freq: Two times a day (BID) | ORAL | 0 refills | Status: DC

## 2024-06-03 MED ORDER — PREDNISONE 20 MG PO TABS
60.0000 mg | ORAL_TABLET | Freq: Once | ORAL | Status: AC
Start: 2024-06-03 — End: 2024-06-03
  Administered 2024-06-03: 60 mg via ORAL
  Filled 2024-06-03: qty 3

## 2024-06-03 MED ORDER — DOXYCYCLINE HYCLATE 100 MG PO TABS
100.0000 mg | ORAL_TABLET | Freq: Once | ORAL | Status: AC
  Administered 2024-06-03: 100 mg via ORAL
  Filled 2024-06-03: qty 1

## 2024-06-03 MED ORDER — PREDNISONE 50 MG PO TABS: 50.0000 mg | ORAL_TABLET | Freq: Every day | ORAL | 0 refills | Status: DC

## 2024-06-03 MED ORDER — AMOXICILLIN-POT CLAVULANATE 875-125 MG PO TABS: 1.0000 | ORAL_TABLET | Freq: Two times a day (BID) | ORAL | 0 refills | Status: DC

## 2024-06-03 MED ORDER — AMOXICILLIN-POT CLAVULANATE 875-125 MG PO TABS
1.0000 | ORAL_TABLET | Freq: Two times a day (BID) | ORAL | 0 refills | Status: AC
Start: 2024-06-03 — End: 2024-06-10

## 2024-06-03 MED ORDER — PREDNISONE 50 MG PO TABS: 50.0000 mg | ORAL_TABLET | Freq: Every day | ORAL | 0 refills | Status: AC

## 2024-06-03 MED ORDER — DOXYCYCLINE MONOHYDRATE 100 MG PO TABS: 100.0000 mg | ORAL_TABLET | Freq: Two times a day (BID) | ORAL | 0 refills | Status: AC

## 2024-06-03 NOTE — ED Provider Notes (Signed)
 [facility] Provider Note    Event Date/Time   First MD Initiated Contact with Patient 06/03/24 1111     (approximate)   History   Cough   HPI  Jorge Cruz is a 73 y.o. male past medical history significant for HFrEF with a EF of 25% status post AICD, nonobstructive cardiomyopathy, diabetes, COPD, atrial fibrillation on Eliquis , polysubstance abuse, CKD, who presents to the emergency department for cough.  Endorses ongoing cough for the past 3 days with shortness of breath.  Intermittent episodes of chest pain but denies any chest pain at this time.  States that is worse with coughing.  No history of DVT or PE.  Denies any swelling to his legs.  Does endorse ongoing tobacco use.  States that he has been compliant with his Eliquis .  Denies any falls or trauma.  Denies any recent substance use.  Denies any significant alcohol use.     Physical Exam   Triage Vital Signs: ED Triage Vitals [06/03/24 1023]  Encounter Vitals Group     BP 109/68     Girls Systolic BP Percentile      Girls Diastolic BP Percentile      Boys Systolic BP Percentile      Boys Diastolic BP Percentile      Pulse Rate 79     Resp 20     Temp 98.1 F (36.7 C)     Temp Source Oral     SpO2 95 %     Weight      Height      Head Circumference      Peak Flow      Pain Score 0     Pain Loc      Pain Education      Exclude from Growth Chart     Most recent vital signs: Vitals:   06/03/24 1120 06/03/24 1424  BP:  100/74  Pulse:  80  Resp:  18  Temp:    SpO2: 96% 100%    Physical Exam Constitutional:      Appearance: He is well-developed.  HENT:     Head: Atraumatic.  Eyes:     Extraocular Movements: Extraocular movements intact.     Conjunctiva/sclera: Conjunctivae normal.     Pupils: Pupils are equal, round, and reactive to light.  Cardiovascular:     Rate and Rhythm: Regular rhythm.     Pulses: Normal pulses.     Heart sounds: No murmur heard. Pulmonary:      Effort: No respiratory distress.     Breath sounds: Wheezing present.  Abdominal:     Tenderness: There is no abdominal tenderness.  Musculoskeletal:        General: Normal range of motion.     Cervical back: Normal range of motion.     Right lower leg: No edema.     Left lower leg: No edema.     Comments: No new lateral leg swelling  Skin:    General: Skin is warm.  Neurological:     Mental Status: He is alert. Mental status is at baseline.     IMPRESSION / MDM / ASSESSMENT AND PLAN / ED COURSE  I reviewed the triage vital signs and the nursing notes.  Differential diagnosis including ACS, pneumonia, COPD exacerbation, heart failure exacerbation, pulmonary embolism, viral illness including COVID/influenza.  Have low suspicion for dissection, no ongoing chest pain and no tearing chest pain, no radiation to his back, symmetric and  equal pulses.  On chart review last hospitalization was in May for COPD exacerbation.   EKG  I, Clotilda Punter, the attending physician, personally viewed and interpreted this ECG.  EKG showed normal sinus rhythm.  Signs of atrial enlargement.  No significant ST elevation or depression.  QTc 448.  No significant change when compared to prior EKG.  No findings of acute ischemia or dysrhythmia. No tachycardic or bradycardic dysrhythmias while on cardiac telemetry.  RADIOLOGY I independently reviewed imaging, my interpretation of imaging: Chest x-ray with no signs of pneumonia, no pneumothorax and no widened mediastinum.  AICD noted.  LABS (all labs ordered are listed, but only abnormal results are displayed) Labs interpreted as -    Labs Reviewed  BASIC METABOLIC PANEL WITH GFR - Abnormal; Notable for the following components:      Result Value   Glucose, Bld 108 (*)    BUN 30 (*)    Creatinine, Ser 1.33 (*)    GFR, Estimated 56 (*)    All other components within normal limits  BRAIN NATRIURETIC PEPTIDE - Abnormal; Notable for the following  components:   B Natriuretic Peptide 259.2 (*)    All other components within normal limits  TROPONIN I (HIGH SENSITIVITY) - Abnormal; Notable for the following components:   Troponin I (High Sensitivity) 42 (*)    All other components within normal limits  TROPONIN I (HIGH SENSITIVITY) - Abnormal; Notable for the following components:   Troponin I (High Sensitivity) 45 (*)    All other components within normal limits  RESP PANEL BY RT-PCR (RSV, FLU A&B, COVID)  RVPGX2  CBC     MDM    Patient was given DuoNeb treatment.  Significant wheezing on exam.  Given steroids and started on antibiotic given concern for COPD exacerbation.  Chest x-ray with no focal findings consistent with pneumonia  Troponin was positive but is stable and at his baseline.  Patient chronically has elevated troponin and has a history of nonischemic cardiomyopathy.  No findings consistent with significant heart failure exacerbation at this time.  Clinical picture is most consistent with COPD exacerbation.  On reevaluation significant improvement after DuoNeb treatment and states he is feeling much better.  Serial troponins are stable.  Patient was complaining of a headache that had been ongoing.  CT scan of the head with no signs of intracranial hemorrhage or infarction.  Did note a sinus disease.  Patient with 100% on room air.  Continued to be hemodynamically stable in the emergency department and states he is feeling much better.  Have a low suspicion for pulmonary embolism, low risk Wells criteria, improvement of her shortness of breath and no pleuritic nature and he is already on Eliquis  and has been compliant.  Will start the patient on antibiotics.  Given a course of steroids.  Discussed DuoNebs and smoking cessation.  Discussed close follow-up with primary care physician in the next 1 to 2 days and return to the emergency department for any ongoing or worsening symptoms.  Patient expressed understanding.  No  questions at time of discharge.   PROCEDURES:  Critical Care performed: No  Procedures  Patient's presentation is most consistent with acute presentation with potential threat to life or bodily function.   MEDICATIONS ORDERED IN ED: Medications  ipratropium-albuterol  (DUONEB) 0.5-2.5 (3) MG/3ML nebulizer solution 9 mL (9 mLs Nebulization Given 06/03/24 1214)  predniSONE  (DELTASONE ) tablet 60 mg (60 mg Oral Given 06/03/24 1214)  doxycycline  (VIBRA -TABS) tablet 100 mg (100 mg Oral  Given 06/03/24 1214)    FINAL CLINICAL IMPRESSION(S) / ED DIAGNOSES   Final diagnoses:  COPD with acute exacerbation (HCC)  Nonintractable headache, unspecified chronicity pattern, unspecified headache type     Rx / DC Orders   ED Discharge Orders          Ordered    predniSONE  (DELTASONE ) 50 MG tablet  Daily with breakfast,   Status:  Discontinued        06/03/24 1404    amoxicillin-clavulanate (AUGMENTIN) 875-125 MG tablet  2 times daily,   Status:  Discontinued        06/03/24 1404    doxycycline  (ADOXA) 100 MG tablet  2 times daily,   Status:  Discontinued        06/03/24 1404    predniSONE  (DELTASONE ) 50 MG tablet  Daily with breakfast        06/03/24 1424    amoxicillin-clavulanate (AUGMENTIN) 875-125 MG tablet  2 times daily        06/03/24 1424    doxycycline  (ADOXA) 100 MG tablet  2 times daily        06/03/24 1424             Note:  This document was prepared using Dragon voice recognition software and may include unintentional dictation errors.   Suzanne Kirsch, MD 06/03/24 [phone]

## 2024-06-03 NOTE — ED Triage Notes (Signed)
 Pt to ED via POV from home. Pt reports cough x3 days and SOB. Pt with hx of COPD and is everyday smoker. Pt also reports runny nose. Pt also reports some left sided CP that has been intermittent.

## 2024-06-03 NOTE — Discharge Instructions (Signed)
 You were seen in the emergency department for cough and shortness of breath.  You had a chest x-ray done did not show signs of pneumonia.  You are given breathing treatments for COPD exacerbation.  You are given your first dose of antibiotics and steroids in the emergency department.  He had a CT scan of your head that did not show any abnormalities to explain your headache.  You did have findings concerning for possible acute sinusitis.  Given your ongoing symptoms you were treated with an antibiotic.  It is importantly follow-up with your primary care physician this week for reevaluation.  If you have any worsening of symptoms return to the emergency department.  Work on quitting smoking.  Prednisone  -you are given a prescription for a steroid.  It is important that you take this medication with food.  This medication can cause an upset stomach.  It also can increase your glucose if you have a history of diabetes, so it is important that you check your glucose frequently while you are on this medication.  Doxycycline  - This medication can cause acid reflux.  It is important that you take it with food and drink plenty of water.  Do not lie down for 1 hour after taking this medication.  It also causes sun sensitivity so stay out of the sun or wear SPF while on this medication.

## 2024-07-03 ENCOUNTER — Telehealth: Payer: Self-pay | Admitting: Family

## 2024-07-03 NOTE — Telephone Encounter (Signed)
 Called to confirm/remind patient of their appointment at the [facility] on 07/04/24.   Appointment:   [] Confirmed  [x] Left mess   [] No answer/No voice mail  [] VM Full/unable to leave message  [] Phone not in service  Patient reminded to bring all medications and/or complete list.  Confirmed patient has transportation. Gave directions, instructed to utilize valet parking.

## 2024-07-03 NOTE — Progress Notes (Deleted)
 [facility] Note    PCP: [facility] (last seen 11/23) Primary Cardiologist: Mady Bruckner, MD / Gerard Frederick, NP (last seen 06/25)  Chief Complaint: shortness of breath   HPI:  Mr Peak is a 73 y/o male with a history of DM, COPD, HTN, nonobstructive CAD, PAF, CKD, tobacco use and chronic heart failure. Had ICD implanted 05/21 monitored through [facility]. [place] off a building [place] 1987 head first. Has difficulty reading.   Echo 10/11/19: EF of 30-35% along with trivial MR. Echo 07/06/20: EF of 25%. Echo 08/20/21: EF of 30-35%.     Was in the ED 07/23/22 due to an acute cough. Thought to be due to bronchitis.    Echo 02/08/23: EF 25-30% with mild LVH   Admitted 08/09/23 due to shortness of breath and cough for > 1 week. Noted 10 pound weight gain. Admits to dietary indiscretion and inconsistent medication compliance. Initially needed oxygen but able to be weaned off. IV diuresed. Cardiology consulted. Patient reports significant improvement and he was released.   Admitted 12/25/23 with shortness of breath, weight gain, abdominal distention and nonproductive cough. He had significant respiratory distress, was placed on BiPAP with 40% oxygen. ABG showed significant CO2 retention, creatinine 1.36, BNP 876, troponin peak at 284. Patient is placed on IV Lasix  and steroids. Weaned off bipap => nasal cannula => to room air.   Seen in [facility] 05/25 and had entresto  decreased to 49/51mg  due to hypotension.   He presents today for a HF follow-up visit with a chief complaint of minimal shortness of breath. Has occasional dizziness. Sleeping well on 1-2 pillows. Denies fatigue, chest pain, palpitations, abdominal distention, pedal edema, weight gain.   Smoking 1/3 ppd of cigarettes and last drank ~ 1 month ago. Says that his goal for 2025 is to quit both tobacco and alcohol.   Used cocaine for ~ 30 years but quit that years ago.   Previous cardiac studies:  Remote  treadmill MPI in 03/2016 showed no evidence of perfusion defects with an EF of less than 30% and was overall an intermediate risk study.   Echo 04/01/16: EF of 20-25% along with mild MR.  Echo 11/25/17: EF of 20-25% along with a mildly elevated PA pressure of 35 mm Hg.   Cardiac catheterization 06/01/17: EF of 20% along with mild nonobstructive CAD. RHC also done which showed normal filling pressures, normal pulmonary pressure and moderately reduced cardiac output. Cardiac output was 3.82 with a cardiac index of 1.83.  ROS: All systems negative except as listed in HPI, PMH and Problem List.  SH:  Social History   Socioeconomic History   Marital status: Married    Spouse name: Not on file   Number of children: Not on file   Years of education: Not on file   Highest education level: Not on file  Occupational History   Not on file  Tobacco Use   Smoking status: Every Day    Current packs/day: 0.50    Average packs/day: 0.5 packs/day for 52.0 years (26.0 ttl pk-yrs)    Types: Cigarettes   Smokeless tobacco: Never  Vaping Use   Vaping status: Never Used  Substance and Sexual Activity   Alcohol use: Yes    Alcohol/week: 4.0 - 5.0 standard drinks of alcohol    Types: 1 - 2 Cans of beer, 3 Shots of liquor per week    Comment: occassional drink; past-1/2 to 1 pint liquor a week   Drug use: Not Currently  Types: Cocaine    Comment: 3 years   Sexual activity: Not Currently  Other Topics Concern   Not on file  Social History Narrative   Not on file   Social Drivers of Health   Financial Resource Strain: Low Risk  (08/10/2023)   Overall Financial Resource Strain (CARDIA)    Difficulty of Paying Living Expenses: Not very hard  Food Insecurity: No Food Insecurity (12/27/2023)   Hunger Vital Sign    Worried About Running Out of Food in the Last Year: Never true    Ran Out of Food in the Last Year: Never true  Transportation Needs: No Transportation Needs (12/27/2023)   PRAPARE -  Administrator, Civil Service (Medical): No    Lack of Transportation (Non-Medical): No  Physical Activity: Not on file  Stress: Not on file  Social Connections: Moderately Isolated (12/27/2023)   Social Connection and Isolation Panel    Frequency of Communication with Friends and Family: More than three times a week    Frequency of Social Gatherings with Friends and Family: More than three times a week    Attends Religious Services: Never    Database Administrator or Organizations: No    Attends Banker Meetings: Never    Marital Status: Married  Catering Manager Violence: Not At Risk (12/27/2023)   Humiliation, Afraid, Rape, and Kick questionnaire    Fear of Current or Ex-Partner: No    Emotionally Abused: No    Physically Abused: No    Sexually Abused: No    FH:  Family History  Problem Relation Age of Onset   Diabetes Mother    Diabetes Father     Past Medical History:  Diagnosis Date   CHF (congestive heart failure) (HCC)    Chronic combined systolic (congestive) and diastolic (congestive) heart failure (HCC)    a. 03/2014 Echo: EF 45%, glob HK; b. 03/2016 Echo: EF 20-25%, diff HK, Gr1 DD, mild MR; b. 11/2017 Echo: EF 20%, diff HK, Gr2 DD, mildly to mod reduced RV fxn, PASP .   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 03/2014 Echo: EF 45%, glob HK, mild conc LVH; b. 03/2014 MV: possible mild ischemia, superimposed on small inf infarct, EF 36%; c. 03/2016 MV: EF <30%, no ischemia; d. 03/2016 Echo: EF 20-25%, diff HK, Gr1 DD, mild MR; e. 05/2017 Cath: nonobs dzs, EF 20%; f. 11/2017 Echo: EF 20%, diff HK, Gr2 DD.   Non-obstructive CAD (coronary artery disease)     a. 05/2017 Cath: LM nl, LAD 81m, LCX 52m, RCA 34m, EF 20%.   NSTEMI (non-ST elevated myocardial infarction) (HCC) 05/31/2017   Paroxysmal atrial fibrillation (HCC)    Polysubstance abuse (HCC)    Tobacco abuse     Current Outpatient Medications  Medication Sig Dispense Refill   albuterol  (PROVENTIL  HFA;VENTOLIN  HFA) 108 (90 Base) MCG/ACT inhaler Inhale 2 puffs into the lungs every 6 (six) hours as needed for wheezing or shortness of breath.     allopurinol  (ZYLOPRIM ) 100 MG tablet Take 1 tablet by mouth daily.     apixaban  (ELIQUIS ) 5 MG TABS tablet Take 1 tablet (5 mg total) by mouth 2 (two) times  daily. 180 tablet 1   atorvastatin  (LIPITOR ) 80 MG tablet Take 1 tablet (80 mg total) by mouth daily. 90 tablet 1   benzonatate  (TESSALON ) 200 MG capsule Take 1 capsule (200 mg total) by mouth 3 (three) times daily as needed for cough. 21 capsule 0   carvedilol  (COREG ) 6.25 MG tablet TAKE 1 TABLET (6.25MG ) BY MOUTH TWICE DAILY 180 tablet 11   clotrimazole  (CLOTRIMAZOLE  ANTI-FUNGAL) 1 % cream Apply 1 Application topically 2 (two) times daily. Apply to the foreskin and head of the penis 30 g 0   FARXIGA  10 MG TABS tablet Take 1 tablet (10 mg total) by mouth daily. 30 tablet 5   fluticasone  (FLONASE ) 50 MCG/ACT nasal spray Place 2 sprays into both nostrils daily.     furosemide  (LASIX ) 40 MG tablet Take 1 tablet (40 mg total) by mouth daily. 90 tablet 3   guaiFENesin -dextromethorphan  (ROBITUSSIN DM) 100-10 MG/5ML syrup Take 5 mLs by mouth every 4 (four) hours as needed for cough. 118 mL 0   hydrocortisone cream 1 % Apply 1 Application topically 2 (two) times daily.     ipratropium-albuterol  (DUONEB) 0.5-2.5 (3) MG/3ML SOLN Take 3 mLs by nebulization 3 (three) times daily for 7 days, THEN 3 mLs every 6 (six) hours as needed for up to 21 days (SOB, wheezing). 720 mL 0   metFORMIN  (GLUCOPHAGE ) 1000 MG tablet Take 1,000 mg by mouth 2 (two) times daily.     nitroGLYCERIN  (NITROSTAT ) 0.4 MG SL tablet  DISSOLVE 1 TABLET UNDER THE  TONGUE EVERY 5 MINUTES AS NEEDED FOR CHEST PAIN. MAX OF 3 TABLETS IN 15 MINUTES. CALL 911 IF PAIN  PERSISTS. 25 tablet 0   sacubitril -valsartan  (ENTRESTO ) 49-51 MG Take 1 tablet by mouth 2 (two) times daily. 180 tablet 3   No current facility-administered medications for this visit.   There were no vitals filed for this visit.  Wt Readings from Last 3 Encounters:  02/14/24 215 lb (97.5 kg)  02/13/24 213 lb (96.6 kg)  01/03/24 220 lb 12.8 oz (100.2 kg)   Lab Results  Component Value Date   CREATININE 1.33 (H) 06/03/2024   CREATININE 1.27 02/14/2024   CREATININE 1.33 (H) 01/03/2024    PHYSICAL EXAM:  General: Well appearing. No resp difficulty HEENT: normal Neck: supple, no JVD Cor: Regular rhythm, rate. No rubs, gallops or murmurs Lungs: clear Abdomen: soft, nontender, nondistended. Extremities: no cyanosis, clubbing, rash, edema Neuro: alert & oriented X 3. Moves all 4 extremities w/o difficulty. Affect pleasant   ECG: not done   ASSESSMENT & PLAN:  1: NICM with reduced ejection fraction- - likely due to HTN as cath in 2018 was nonobstructive - NYHA class II - euvolemic today - weight down 5 pounds since he was last here 6 weeks ago - Echo 07/06/20: EF of 25%.  - Echo 08/20/21: EF of 30-35%.  - Echo 02/08/23: EF 25-30% with mild LVH  - updated echo has been scheduled for 08/25 - continue carvedilol  6.25mg  BID - continue farxiga  10mg  daily - continue furosemide  40mg  daily - continue entresto  49/51mg  BID as BP is now much improved - previously had been on spironolactone  but he developed hyperkalemia, K+ 6.1 in 01/23 - consider kerendia 10mg  as this has been approved by his insurance as $0 copay - BMET today - had ICD implantation 01/02/20; denies any firing of ICD - [facility] EP referral placed today, per his preference (had previously been followed at [facility]) - using Mrs Hollis for seasoning  - BNP  12/25/23 was 876.4  2: HTN- - BP 119/63;  improved - sees PCP at [facility]  - BMP 01/03/24 reviewed: sodium 143, potassium 4.3, creatinine 1.33 and GFR 57 - BMET today  3: DM- - A1c on 08/09/23 was 7.2%  4:Tobacco use-  - smoking 1/3 ppd of cigarettes & plans to quit this year - has not drank any alcohol in the last month - complete cessation discussed for 3 minutes with the patient  5: Non-obstructive CAD- - saw cardiology ([place]) 06/25 - continue atorvastatin  80mg  daily - LDL 09/02/21 was 33 - Cardiac catheterization 06/01/17: EF of 20% along with mild nonobstructive CAD. RHC also done which showed normal filling pressures,  normal pulmonary pressure and moderately reduced cardiac output. Cardiac output was 3.82 with a cardiac index of 1.83.  6: PAF- - continue apixaban  5mg  BID - HR regular today   Return 2 weeks after echo, sooner if needed.   Ellouise DELENA Class, FNP 07/03/24

## 2024-07-04 ENCOUNTER — Telehealth: Payer: Self-pay | Admitting: Family

## 2024-07-04 ENCOUNTER — Ambulatory Visit: Admitting: Family

## 2024-07-04 NOTE — Telephone Encounter (Signed)
 Patient did not show for his [facility] Failure [facility] appointment on 07/04/24.

## 2024-07-15 ENCOUNTER — Ambulatory Visit: Attending: Cardiology | Admitting: Cardiology

## 2024-07-15 NOTE — Progress Notes (Deleted)
 Cardiology Office Note   Date:  07/15/2024  ID:  Gurshaan Matsuoka, DOB 09/19/1950, MRN [id number] PCP: Osa Geralds, NP  [facility] Providers Cardiologist:  Lonni Hanson, MD { Click to update primary MD,subspecialty MD or APP then REFRESH:1}    History of Present Illness Orvan Papadakis is a 73 y.o. male with a past medical history of nonobstructive coronary artery disease, chronic HFrEF s/p ICD (12/2010), paroxysmal atrial fibrillation, type 2 diabetes, CKD stage III, hypertension, COPD, polysubstance abuse, who is here today for follow-up. .  Mr. Mcneff underwent remote treadmill MPI in 03/2016 that showed no evidence of perfusion defects with an EF of less than 30% and was overall an intermediate risk study. Blood pressure demonstrated a hypertensive response to exercise. Echo at that time demonstrated an EF of 20 to 25%, grade 1 diastolic dysfunction, mild concentric LVH, and mild mitral regurgitation. Subsequent R/LHC in 05/2017 showed mild nonobstructive CAD as outlined below with an EF of 20%. RHC showed normal filling pressures, normal pulmonary pressure, and moderately reduced cardiac output. Over the years, he has had a persistent cardiomyopathy with echo from 07/2021 demonstrating an EF of 30 to 35%, global hypokinesis, mildly dilated LV internal cavity size, grade 1 diastolic dysfunction, mildly reduced RV systolic function with mildly enlarged ventricular cavity size, no significant valvular abnormalities, and an estimated right atrial pressure of 3 mmHg. He underwent ICD implantation at [facility] in 2021. He has intermittently been followed by both [facility] cardiology, [facility] cardiology, [facility], and the [facility] more recently. Most recent echo from 01/2023 showed an EF of 25-30%, global hypokinesis, mild LVH, low normal RVSF with normal ventricular cavity size, and an estimated right atrial pressure of 3 mmHg.  He was evaluated in the emergency department  in late 06/2023 with complaints of cough.  BNP at that time was 637 with a high-sensitivity troponin peaking at 92.  Chest x-ray was without acute cardiopulmonary process.  He was given DuoNebs advised to take his Lasix  daily he was subsequently discharged home.  He was scheduled outpatient follow-up with advanced heart failure but was a no-show for his appointment 08/07/2023.  He was admitted to [facility] 08/09/2023 for shortness of breath, dry cough, and abdominal swelling.  Chest x-ray showed mild cardiomegaly with no active cardiopulmonary process.  High-sensitivity troponin 0.25 with a delta 155, peaking at 159 trending down to 148.  BNP 84, COVID/influenza/RSV SV PCR swab was negative.  He was diuresed and started on GDMT and outpatient appointments were made with advanced heart failure.  He was considered stable for discharge and was discharged home on 08/11/2023.  He was previously seen in clinic 09/12/2019 stating overall he had been doing well.  He continued to have occasional shortness of breath.  States he had been compliant with his current medication regimen.  Did have some complaints of intermittent claudication was scheduled for ABIs.  He was seen in clinic 12/12/2023 and overall been doing well with cardiac perspective.  He had noticed a slight uptick in his weight but it is since resolved.  He continued to have his shortness of breath that was unchanged.  States he had been compliant with his current medications.  Denies any missed doses of his apixaban .  He was sent for updated lab slip.  But there were no medication changes that were made to his current regiment.  He was hospitalized at [facility] 5/5 of-12/28/2023 for respiratory distress. He arrived to the hospital had to be placed  on BiPAP with 40% oxygen. ABG showed significant CO2 retention, serum creatinine was up to 1.36, BNP of 876, high-sensitivity troponin peaked at 284. He was placed on IV Lasix  and steroids. Was able to be weaned off of  BiPAP the morning of 5/6. He stated manage and shortness of breath and nonproductive cough that he had been experienced for approximately 5 days prior to arrival. During that time he also noted fluctuations in his weight and abdominal distention. His wife at the bedside felt that his abdomen appeared bigger than normal. He was started on breathing treatments and discharged home with doxycycline . After being evaluated in the emergency department earlier today prior to admission. He stated he was stable after arriving home and his shortness of breath rapidly progressed and became severe. He began to experience chest pressure when he was in respiratory distress but noted that the pressure improvement in shortness of breath improved. Chest x-ray revealed enlarged heart with no overt edema. He was treated with prednisone  and Solu-Medrol . It was thought that his shortness of breath was related to COPD exacerbation versus heart failure.   He was last seen in clinic 02/09/2024 recommended by his wife.  Stating overall he was doing well from a cardiac perspective.  Denied any chest pain, shortness of breath or weight gain.  States has been compliant with his current medications without any adverse effects he continue to follow with advanced heart failure.    He returns to clinic today  ROS: 10 point review of systems has been reviewed and considered negative the exception was been listed in HPI  Studies Reviewed      2D echo 02/08/2023 1. Left ventricular ejection fraction, by estimation, is 25 to 30%. The  left ventricle has severely decreased function. The left ventricle  demonstrates global hypokinesis. There is mild left ventricular  hypertrophy. Left ventricular diastolic parameters   are indeterminate.   2. Right ventricular systolic function is low normal. The right  ventricular size is normal.   3. The mitral valve is normal in structure. No evidence of mitral valve  regurgitation.   4. The aortic  valve is grossly normal. Aortic valve regurgitation is not  visualized.   5. The inferior vena cava is normal in size with greater than 50%  respiratory variability, suggesting right atrial pressure of 3 mmHg.    2D echo 08/20/2021 1. Left ventricular ejection fraction, by estimation, is 30 to 35%. The  left ventricle has moderate to severely decreased function. The left  ventricle demonstrates global hypokinesis. The left ventricular internal  cavity size was mildly dilated. Left  ventricular diastolic parameters are consistent with Grade I diastolic  dysfunction (impaired relaxation).   2. Right ventricular systolic function is mildly reduced. The right  ventricular size is mildly enlarged.   3. The mitral valve is normal in structure. No evidence of mitral valve  regurgitation.   4. The aortic valve is tricuspid. Aortic valve regurgitation is not  visualized.   5. The inferior vena cava is normal in size with greater than 50%  respiratory variability, suggesting right atrial pressure of 3 mmHg.    Risk Assessment/Calculations  CHA2DS2-VASc Score = 4  {Confirm score is correct.  If not, click here to update score.  REFRESH note.  :1} This indicates a 4.8% annual risk of stroke. The patient's score is based upon: CHF History: 1 HTN History: 1 Diabetes History: 1 Stroke History: 0 Vascular Disease History: 0 Age Score: 1 Gender Score: 0   {  This patient has a significant risk of stroke if diagnosed with atrial fibrillation.  Please consider VKA or DOAC agent for anticoagulation if the bleeding risk is acceptable.   You can also use the SmartPhrase .[email or web address] for documentation.   :[id number]} No BP recorded.  {Refresh Note OR Click here to enter BP  :1}***       Physical Exam VS:  There were no vitals taken for this visit.       Wt Readings from Last 3 Encounters:  02/14/24 215 lb (97.5 kg)  02/13/24 213 lb (96.6 kg)  01/03/24 220 lb 12.8 oz (100.2 kg)    GEN: Well  nourished, well developed in no acute distress NECK: No JVD; No carotid bruits CARDIAC: ***RRR, no murmurs, rubs, gallops RESPIRATORY:  Clear to auscultation without rales, wheezing or rhonchi  ABDOMEN: Soft, non-tender, non-distended EXTREMITIES:  No edema; No deformity   ASSESSMENT AND PLAN Chronic HFrEF secondary to NICM status post ICD Nonobstructive coronary artery disease Paroxysmal atrial fibrillation CKD stage II/III Primary hypertension Hyperlipidemia Type 2 diabetes    {Are you ordering a CV Procedure (e.g. stress test, cath, DCCV, TEE, etc)?   Press F2        :[id number]}  Dispo: ***  Signed, Khrystina Bonnes, NP
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
## 2024-08-02 ENCOUNTER — Ambulatory Visit: Admitting: Family

## 2024-08-02 ENCOUNTER — Other Ambulatory Visit
Admission: RE | Admit: 2024-08-02 | Discharge: 2024-08-02 | Disposition: A | Source: Ambulatory Visit | Attending: Family | Admitting: Family

## 2024-08-02 ENCOUNTER — Ambulatory Visit: Payer: Self-pay | Admitting: Family

## 2024-08-02 ENCOUNTER — Encounter: Payer: Self-pay | Admitting: Family

## 2024-08-02 VITALS — BP 138/63 | HR 77 | Wt 213.4 lb

## 2024-08-02 DIAGNOSIS — G4719 Other hypersomnia: Secondary | ICD-10-CM | POA: Diagnosis not present

## 2024-08-02 DIAGNOSIS — I5020 Unspecified systolic (congestive) heart failure: Secondary | ICD-10-CM | POA: Diagnosis present

## 2024-08-02 DIAGNOSIS — E782 Mixed hyperlipidemia: Secondary | ICD-10-CM

## 2024-08-02 DIAGNOSIS — I10 Essential (primary) hypertension: Secondary | ICD-10-CM | POA: Diagnosis not present

## 2024-08-02 DIAGNOSIS — N1832 Chronic kidney disease, stage 3b: Secondary | ICD-10-CM | POA: Diagnosis not present

## 2024-08-02 DIAGNOSIS — Z720 Tobacco use: Secondary | ICD-10-CM

## 2024-08-02 DIAGNOSIS — I480 Paroxysmal atrial fibrillation: Secondary | ICD-10-CM | POA: Diagnosis not present

## 2024-08-02 DIAGNOSIS — I2510 Atherosclerotic heart disease of native coronary artery without angina pectoris: Secondary | ICD-10-CM | POA: Diagnosis not present

## 2024-08-02 DIAGNOSIS — E1122 Type 2 diabetes mellitus with diabetic chronic kidney disease: Secondary | ICD-10-CM

## 2024-08-02 LAB — BASIC METABOLIC PANEL WITH GFR
Anion gap: 10 (ref 5–15)
BUN: 30 mg/dL — ABNORMAL HIGH (ref 8–23)
CO2: 29 mmol/L (ref 22–32)
Calcium: 9.9 mg/dL (ref 8.9–10.3)
Chloride: 103 mmol/L (ref 98–111)
Creatinine, Ser: 1.33 mg/dL — ABNORMAL HIGH (ref 0.61–1.24)
GFR, Estimated: 56 mL/min — ABNORMAL LOW (ref >60)
Glucose, Bld: 82 mg/dL (ref 70–99)
Potassium: 4.5 mmol/L (ref 3.5–5.1)
Sodium: 142 mmol/L (ref 135–145)

## 2024-08-02 LAB — LIPID PANEL
Cholesterol: 103 mg/dL (ref 0–200)
HDL: 36 mg/dL — ABNORMAL LOW (ref >40)
LDL Cholesterol: 43 mg/dL (ref 0–99)
Total CHOL/HDL Ratio: 2.9 ratio
Triglycerides: 120 mg/dL (ref <150)
VLDL: 24 mg/dL (ref 0–40)

## 2024-08-02 NOTE — Progress Notes (Signed)
 Patient Name:         DOB:       Height:     Weight:  Office Name:         Referring Provider:  Today's Date:  Date:   STOP BANG RISK ASSESSMENT S (snore) Have you been told that you snore?     YES   T (tired) Are you often tired, fatigued, or sleepy during the day?   YES  O (obstruction) Do you stop breathing, choke, or gasp during sleep? NO   P (pressure) Do you have or are you being treated for high blood pressure? YES   B (BMI) Is your body index greater than 35 kg/m? NO   A (age) Are you 73 years old or older? YES   N (neck) Do you have a neck circumference greater than 16 inches?   NO   G (gender) Are you a male? YES   TOTAL STOP/BANG "YES" ANSWERS 5                                                                       For Office Use Only              Procedure Order Form    YES to 3+ Stop Bang questions OR two clinical symptoms - patient qualifies for WatchPAT (CPT 95800)             Clinical Notes: Will consult Sleep Specialist and refer for management of therapy due to patient increased risk of Sleep Apnea. Ordering a sleep study due to the following two clinical symptoms: Excessive daytime sleepiness G47.10 History of high blood pressure R03.0     I understand that I am proceeding with a home sleep apnea test as ordered by my treating physician. I understand that untreated sleep apnea is a serious cardiovascular risk factor and it is my responsibility to perform the test and seek management for sleep apnea. I will be contacted with the results and be managed for sleep apnea by a local sleep physician. I will be receiving equipment and further instructions from [facility]. I shall promptly ship back the equipment via the included mailing label. I understand my insurance will be billed for the test and as the patient I am responsible for any insurance related out-of-pocket costs incurred. I have been provided with written instructions and can call for additional  video or telephonic instruction, with 24-hour availability of qualified personnel to answer any questions: Patient Help Desk [phone].  Patient Signature ______________________________________________________   Date______________________ Patient Telemedicine Verbal Consent

## 2024-08-02 NOTE — Patient Instructions (Addendum)
 Medication Changes:  No medication changes today!  Lab Work:  Go over to the MEDICAL MALL. Go pass the gift shop and have your blood work completed.  Go downstairs to [facility] on LOWER LEVEL to have your blood work completed.  We will only call you if the results are abnormal or if the provider would like to make medication changes.  No news is good news.     Testing/Procedures:  Your provider has recommended that you have a home sleep study (Itamar Test).  We have provided you with the equipment in our office today. Please go ahead and download the app. DO NOT OPEN OR TAMPER WITH THE BOX UNTIL WE ADVISE YOU TO DO SO. Once insurance has approved the test our office will call you with PIN number and approval to proceed with testing. Once you have completed the test you just dispose of the equipment, the information is automatically uploaded to us  via blue-tooth technology. If your test is positive for sleep apnea and you need a home CPAP machine you will be contacted by Dr Dorine office [facility]) to set this up.  Your pin # is: [id number]   Your physician has requested that you have an echocardiogram. Echocardiography is a painless test that uses sound waves to create images of your heart. It provides your doctor with information about the size and shape of your heart and how well your hearts chambers and valves are working. This procedure takes approximately one hour. There are no restrictions for this procedure. Please do NOT wear cologne, perfume, aftershave, or lotions (deodorant is allowed). Please arrive 15 minutes prior to your appointment time.  Please note: We ask at that you not bring children with you during ultrasound (echo/ vascular) testing. Due to room size and safety concerns, children are not allowed in the ultrasound rooms during exams. Our front office staff cannot provide observation of children in our lobby area while testing is being conducted. An adult  accompanying a patient to their appointment will only be allowed in the ultrasound room at the discretion of the ultrasound technician under special circumstances. We apologize for any inconvenience. SOMEONE WILL CONTACT YOU IN ORDER TO SCHEDULE YOUR APPOINTMENT.   Your provider has recommended that  you wear a Zio Patch for 14 days.  This monitor will record your heart rhythm for our review.  IF you have any symptoms while wearing the monitor please press the button.  If you have any issues with the patch or you notice a red or orange light on it please call the company at [phone].  Once you remove the patch please mail it back to the company as soon as possible so we can get the results.   Follow-Up in: Please follow up with the [facility] in 1 month with Ellouise Class, FNP.   Thank you for choosing [facility].    At the [facility], you and your health needs are our priority. We have a designated team specialized in the treatment of Heart Failure. This Care Team includes your primary Heart Failure Specialized Cardiologist (physician), Advanced Practice Providers (APPs- Physician Assistants and Nurse Practitioners), and Pharmacist who all work together to provide you with the care you need, when you need it.   You may see any of the following providers on your designated Care Team at your next follow up:  Dr. Toribio Fuel Dr. Ezra Shuck Dr. Ria Commander Dr. Morene Brownie  Ellouise Class, FNP Jaun Bash, RPH-CPP  Please be sure to bring in all your medications bottles to every appointment.   Need to Contact Us :  If you have any questions or concerns before your next appointment please send us  a message through [place] or call our office at [phone].    TO LEAVE A MESSAGE FOR THE NURSE SELECT OPTION 2, PLEASE LEAVE A MESSAGE INCLUDING: YOUR NAME DATE OF BIRTH CALL BACK NUMBER REASON FOR  CALL**this is important as we prioritize the call backs  YOU WILL RECEIVE A CALL BACK THE SAME DAY AS LONG AS YOU CALL BEFORE 4:00 PM

## 2024-08-30 ENCOUNTER — Telehealth: Payer: Self-pay | Admitting: Family

## 2024-08-30 NOTE — Telephone Encounter (Signed)
 Called to confirm/remind patient of their appointment at the [facility] on 09/02/24.   Appointment:   [] Confirmed  [x] Left mess   [] No answer/No voice mail  [] VM Full/unable to leave message  [] Phone not in service  Patient reminded to bring all medications and/or complete list.  Confirmed patient has transportation. Gave directions, instructed to utilize valet parking.
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
## 2024-09-02 ENCOUNTER — Telehealth: Payer: Self-pay | Admitting: Family

## 2024-09-02 ENCOUNTER — Ambulatory Visit: Admitting: Family

## 2024-09-02 ENCOUNTER — Other Ambulatory Visit: Payer: Self-pay

## 2024-09-02 DIAGNOSIS — Z13820 Encounter for screening for osteoporosis: Secondary | ICD-10-CM

## 2024-09-02 NOTE — Telephone Encounter (Signed)
 Patient did not show for his [facility] Failure Clinic appointment on 09/02/24. This is the 14th appointment he has missed.

## 2024-09-03 ENCOUNTER — Encounter: Payer: Self-pay | Admitting: Student in an Organized Health Care Education/Training Program

## 2024-09-03 ENCOUNTER — Ambulatory Visit: Admitting: Student in an Organized Health Care Education/Training Program

## 2024-09-03 VITALS — BP 138/84 | HR 69 | Temp 97.6°F | Ht 67.5 in | Wt 215.0 lb

## 2024-09-03 DIAGNOSIS — G4710 Hypersomnia, unspecified: Secondary | ICD-10-CM | POA: Diagnosis not present

## 2024-09-03 DIAGNOSIS — F17210 Nicotine dependence, cigarettes, uncomplicated: Secondary | ICD-10-CM

## 2024-09-03 DIAGNOSIS — R0602 Shortness of breath: Secondary | ICD-10-CM | POA: Diagnosis not present
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
## 2024-09-04 ENCOUNTER — Encounter

## 2024-09-04 ENCOUNTER — Other Ambulatory Visit: Payer: Self-pay | Admitting: Obstetrics and Gynecology

## 2024-09-06 ENCOUNTER — Telehealth: Payer: Self-pay | Admitting: Student in an Organized Health Care Education/Training Program

## 2024-09-06 ENCOUNTER — Telehealth: Payer: Self-pay | Admitting: Family

## 2024-09-06 NOTE — Telephone Encounter (Signed)
 Called to confirm/remind patient of [facility] on [facility].   Appointment:   [] Confirmed  [x] Left mess   [] No answer/No voice mail  [] VM Full/unable to leave message  [] Phone not in service  Patient reminded to bring all medications and/or complete list.  Confirmed patient has transportation. Gave directions, instructed to utilize valet parking.

## 2024-09-06 NOTE — Telephone Encounter (Signed)
 [facility] to schedule PFT appointment. Copied from CRM [id number]. Topic: Appointments - Scheduling Inquiry for Clinic >> Sep 06, 2024  9:33 AM Rozanna MATSU wrote: Reason for CRM: pt calling about getting scheduled for his sleep study.
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
## 2024-09-09 ENCOUNTER — Ambulatory Visit: Payer: Self-pay | Attending: Family | Admitting: Family

## 2024-09-09 ENCOUNTER — Encounter: Payer: Self-pay | Admitting: Family

## 2024-09-09 VITALS — BP 128/79 | HR 70 | Wt 215.5 lb

## 2024-09-09 DIAGNOSIS — J449 Chronic obstructive pulmonary disease, unspecified: Secondary | ICD-10-CM | POA: Diagnosis not present

## 2024-09-09 DIAGNOSIS — F17210 Nicotine dependence, cigarettes, uncomplicated: Secondary | ICD-10-CM | POA: Insufficient documentation

## 2024-09-09 DIAGNOSIS — I5020 Unspecified systolic (congestive) heart failure: Secondary | ICD-10-CM

## 2024-09-09 DIAGNOSIS — Z7984 Long term (current) use of oral hypoglycemic drugs: Secondary | ICD-10-CM | POA: Diagnosis not present

## 2024-09-09 DIAGNOSIS — Z79899 Other long term (current) drug therapy: Secondary | ICD-10-CM | POA: Diagnosis not present

## 2024-09-09 DIAGNOSIS — E1122 Type 2 diabetes mellitus with diabetic chronic kidney disease: Secondary | ICD-10-CM | POA: Insufficient documentation

## 2024-09-09 DIAGNOSIS — Z720 Tobacco use: Secondary | ICD-10-CM | POA: Diagnosis not present

## 2024-09-09 DIAGNOSIS — I480 Paroxysmal atrial fibrillation: Secondary | ICD-10-CM | POA: Insufficient documentation

## 2024-09-09 DIAGNOSIS — Z95810 Presence of automatic (implantable) cardiac defibrillator: Secondary | ICD-10-CM | POA: Diagnosis not present

## 2024-09-09 DIAGNOSIS — I130 Hypertensive heart and chronic kidney disease with heart failure and stage 1 through stage 4 chronic kidney disease, or unspecified chronic kidney disease: Secondary | ICD-10-CM | POA: Insufficient documentation

## 2024-09-09 DIAGNOSIS — N1832 Chronic kidney disease, stage 3b: Secondary | ICD-10-CM | POA: Diagnosis not present

## 2024-09-09 DIAGNOSIS — I10 Essential (primary) hypertension: Secondary | ICD-10-CM

## 2024-09-09 DIAGNOSIS — N183 Chronic kidney disease, stage 3 unspecified: Secondary | ICD-10-CM | POA: Insufficient documentation

## 2024-09-09 DIAGNOSIS — G4710 Hypersomnia, unspecified: Secondary | ICD-10-CM | POA: Diagnosis not present

## 2024-09-09 DIAGNOSIS — E782 Mixed hyperlipidemia: Secondary | ICD-10-CM

## 2024-09-09 DIAGNOSIS — G4719 Other hypersomnia: Secondary | ICD-10-CM

## 2024-09-09 DIAGNOSIS — I428 Other cardiomyopathies: Secondary | ICD-10-CM | POA: Insufficient documentation

## 2024-09-09 DIAGNOSIS — I5022 Chronic systolic (congestive) heart failure: Secondary | ICD-10-CM | POA: Insufficient documentation

## 2024-09-09 DIAGNOSIS — E785 Hyperlipidemia, unspecified: Secondary | ICD-10-CM | POA: Diagnosis not present

## 2024-09-09 DIAGNOSIS — R0602 Shortness of breath: Secondary | ICD-10-CM | POA: Diagnosis present

## 2024-09-09 DIAGNOSIS — Z7901 Long term (current) use of anticoagulants: Secondary | ICD-10-CM | POA: Insufficient documentation

## 2024-09-09 DIAGNOSIS — I2510 Atherosclerotic heart disease of native coronary artery without angina pectoris: Secondary | ICD-10-CM | POA: Insufficient documentation

## 2024-09-09 MED ORDER — APIXABAN 5 MG PO TABS: 5.0000 mg | ORAL_TABLET | Freq: Two times a day (BID) | ORAL | 1 refills | Status: AC

## 2024-09-09 NOTE — Patient Instructions (Signed)
 Medication Changes:  No medication changes today!     Testing/Procedures:  Your physician has requested that you have an echocardiogram. Echocardiography is a painless test that uses sound waves to create images of your heart. It provides your doctor with information about the size and shape of your heart and how well your hearts chambers and valves are working. This procedure takes approximately one hour. There are no restrictions for this procedure. Please do NOT wear cologne, perfume, aftershave, or lotions (deodorant is allowed). Please arrive 15 minutes prior to your appointment time.  Please note: We ask at that you not bring children with you during ultrasound (echo/ vascular) testing. Due to room size and safety concerns, children are not allowed in the ultrasound rooms during exams. Our front office staff cannot provide observation of children in our lobby area while testing is being conducted. An adult accompanying a patient to their appointment will only be allowed in the ultrasound room at the discretion of the ultrasound technician under special circumstances. We apologize for any inconvenience.  Someone will be in contact with you in order to schedule your appointment.   Referrals:  You have been referred to [facility] for Electrophysiology. They will contact you in order to schedule your appointment.  Follow-Up in: Please follow up with the Advanced Heart Failure Clinic with Ellouise Class, FNP.   Thank you for choosing [facility] Advanced Heart Failure [facility].    At the Advanced Heart Failure Clinic, you and your health needs are our priority. We have a designated team specialized in the treatment of Heart Failure. This Care Team includes your primary Heart Failure Specialized Cardiologist (physician), Advanced Practice Providers (APPs- Physician Assistants and Nurse Practitioners), and Pharmacist who all work together to provide you with the care you need, when  you need it.   You may see any of the following providers on your designated Care Team at your next follow up:  Dr. Toribio Fuel Dr. Ezra Shuck Dr. Ria Commander Dr. Morene Brownie Ellouise Class, FNP Jaun Bash, RPH-CPP  Please be sure to bring in all your medications bottles to every appointment.   Need to Contact Us :  If you have any questions or concerns before your next appointment please send us  a message through [place] or call our office at [phone].    TO LEAVE A MESSAGE FOR THE NURSE SELECT OPTION 2, PLEASE LEAVE A MESSAGE INCLUDING: YOUR NAME DATE OF BIRTH CALL BACK NUMBER REASON FOR CALL**this is important as we prioritize the call backs  YOU WILL RECEIVE A CALL BACK THE SAME DAY AS LONG AS YOU CALL BEFORE 4:00 PM

## 2024-09-11 ENCOUNTER — Ambulatory Visit

## 2024-09-11 DIAGNOSIS — R0602 Shortness of breath: Secondary | ICD-10-CM

## 2024-09-11 LAB — PULMONARY FUNCTION TEST
DL/VA % pred: 138 %
DL/VA: 5.53 ml/min/mmHg/L
DLCO unc % pred: 110 %
DLCO unc: 25.65 ml/min/mmHg
FEF 25-75 Post: 1.7 L/s
FEF 25-75 Pre: 1.25 L/s
FEF2575-%Change-Post: 36 %
FEF2575-%Pred-Post: 81 %
FEF2575-%Pred-Pre: 59 %
FEV1-%Change-Post: 7 %
FEV1-%Pred-Post: 55 %
FEV1-%Pred-Pre: 51 %
FEV1-Post: 1.57 L
FEV1-Pre: 1.45 L
FEV1FVC-%Change-Post: -1 %
FEV1FVC-%Pred-Pre: 104 %
FEV6-%Change-Post: 8 %
FEV6-%Pred-Post: 56 %
FEV6-%Pred-Pre: 51 %
FEV6-Post: 2.06 L
FEV6-Pre: 1.9 L
FEV6FVC-%Change-Post: 0 %
FEV6FVC-%Pred-Post: 105 %
FEV6FVC-%Pred-Pre: 106 %
FVC-%Change-Post: 9 %
FVC-%Pred-Post: 53 %
FVC-%Pred-Pre: 48 %
FVC-Post: 2.08 L
FVC-Pre: 1.9 L
Post FEV1/FVC ratio: 75 %
Post FEV6/FVC ratio: 99 %
Pre FEV1/FVC ratio: 77 %
Pre FEV6/FVC Ratio: 100 %
RV % pred: 118 %
RV: 2.81 L
TLC % pred: 79 %
TLC: 5.18 L

## 2024-09-11 NOTE — Progress Notes (Signed)
 Full PFT completed today ? ?

## 2024-09-11 NOTE — Patient Instructions (Signed)
 Full PFT completed today ? ?

## 2024-09-26 ENCOUNTER — Ambulatory Visit

## 2024-09-26 DIAGNOSIS — I5020 Unspecified systolic (congestive) heart failure: Secondary | ICD-10-CM

## 2024-09-26 LAB — ECHOCARDIOGRAM COMPLETE
AR max vel: 2 cm2
AV Area VTI: 1.76 cm2
AV Area mean vel: 1.81 cm2
AV Mean grad: 3 mmHg
AV Peak grad: 5.6 mmHg
Ao pk vel: 1.18 m/s
Area-P 1/2: 3.89 cm2
S' Lateral: 5.58 cm

## 2024-10-23 ENCOUNTER — Ambulatory Visit: Admitting: Student in an Organized Health Care Education/Training Program

## 2024-12-11 ENCOUNTER — Ambulatory Visit: Admitting: Family
